# Patient Record
Sex: Female | Born: 1940 | Race: White | Hispanic: No | Smoking: Former smoker
Health system: Southern US, Community
[De-identification: ages and names within clinical notes are randomized; demographics above are authoritative.]

## PROBLEM LIST (undated history)

## (undated) DIAGNOSIS — N390 Urinary tract infection, site not specified: Secondary | ICD-10-CM

## (undated) DIAGNOSIS — J40 Bronchitis, not specified as acute or chronic: Secondary | ICD-10-CM

## (undated) DIAGNOSIS — Z9889 Other specified postprocedural states: Secondary | ICD-10-CM

## (undated) DIAGNOSIS — I10 Essential (primary) hypertension: Secondary | ICD-10-CM

## (undated) DIAGNOSIS — F32A Depression, unspecified: Secondary | ICD-10-CM

## (undated) DIAGNOSIS — Z8719 Personal history of other diseases of the digestive system: Secondary | ICD-10-CM

## (undated) DIAGNOSIS — T4145XA Adverse effect of unspecified anesthetic, initial encounter: Secondary | ICD-10-CM

## (undated) DIAGNOSIS — J189 Pneumonia, unspecified organism: Secondary | ICD-10-CM

## (undated) DIAGNOSIS — E538 Deficiency of other specified B group vitamins: Secondary | ICD-10-CM

## (undated) DIAGNOSIS — Z9114 Patient's other noncompliance with medication regimen: Secondary | ICD-10-CM

## (undated) DIAGNOSIS — M1990 Unspecified osteoarthritis, unspecified site: Secondary | ICD-10-CM

## (undated) DIAGNOSIS — F329 Major depressive disorder, single episode, unspecified: Secondary | ICD-10-CM

## (undated) DIAGNOSIS — Z91148 Patient's other noncompliance with medication regimen for other reason: Secondary | ICD-10-CM

## (undated) DIAGNOSIS — C801 Malignant (primary) neoplasm, unspecified: Secondary | ICD-10-CM

## (undated) DIAGNOSIS — K219 Gastro-esophageal reflux disease without esophagitis: Secondary | ICD-10-CM

## (undated) DIAGNOSIS — I639 Cerebral infarction, unspecified: Secondary | ICD-10-CM

## (undated) DIAGNOSIS — R112 Nausea with vomiting, unspecified: Secondary | ICD-10-CM

## (undated) DIAGNOSIS — E785 Hyperlipidemia, unspecified: Secondary | ICD-10-CM

## (undated) DIAGNOSIS — T8859XA Other complications of anesthesia, initial encounter: Secondary | ICD-10-CM

## (undated) DIAGNOSIS — I5189 Other ill-defined heart diseases: Secondary | ICD-10-CM

## (undated) DIAGNOSIS — G542 Cervical root disorders, not elsewhere classified: Secondary | ICD-10-CM

## (undated) HISTORY — PX: ROTATOR CUFF REPAIR: SHX139

## (undated) HISTORY — PX: ABDOMINAL HYSTERECTOMY: SHX81

## (undated) HISTORY — PX: EYE SURGERY: SHX253

## (undated) HISTORY — PX: TONSILLECTOMY: SUR1361

## (undated) HISTORY — PX: KNEE SURGERY: SHX244

## (undated) HISTORY — PX: BONE CYST EXCISION: SHX376

## (undated) HISTORY — PX: DILATION AND CURETTAGE OF UTERUS: SHX78

## (undated) HISTORY — DX: Deficiency of other specified B group vitamins: E53.8

## (undated) HISTORY — PX: HIP ARTHROPLASTY: SHX981

## (undated) HISTORY — PX: CHOLECYSTECTOMY: SHX55

## (undated) HISTORY — PX: ORTHOPEDIC SURGERY: SHX850

## (undated) HISTORY — PX: APPENDECTOMY: SHX54

## (undated) HISTORY — PX: JOINT REPLACEMENT: SHX530

---

## 2004-05-24 ENCOUNTER — Ambulatory Visit (HOSPITAL_COMMUNITY): Admission: RE | Admit: 2004-05-24 | Discharge: 2004-05-24 | Payer: Self-pay | Admitting: Family Medicine

## 2005-07-24 ENCOUNTER — Ambulatory Visit (HOSPITAL_COMMUNITY): Admission: RE | Admit: 2005-07-24 | Discharge: 2005-07-24 | Payer: Self-pay | Admitting: Family Medicine

## 2006-07-09 ENCOUNTER — Encounter: Admission: RE | Admit: 2006-07-09 | Discharge: 2006-07-09 | Payer: Self-pay | Admitting: Orthopaedic Surgery

## 2006-07-31 ENCOUNTER — Ambulatory Visit (HOSPITAL_COMMUNITY): Admission: RE | Admit: 2006-07-31 | Discharge: 2006-07-31 | Payer: Self-pay | Admitting: Internal Medicine

## 2006-09-29 ENCOUNTER — Inpatient Hospital Stay (HOSPITAL_COMMUNITY): Admission: RE | Admit: 2006-09-29 | Discharge: 2006-10-02 | Payer: Self-pay | Admitting: Orthopaedic Surgery

## 2006-12-01 ENCOUNTER — Ambulatory Visit (HOSPITAL_COMMUNITY): Admission: RE | Admit: 2006-12-01 | Discharge: 2006-12-01 | Payer: Self-pay | Admitting: Orthopaedic Surgery

## 2007-06-11 ENCOUNTER — Encounter: Admission: RE | Admit: 2007-06-11 | Discharge: 2007-06-11 | Payer: Self-pay | Admitting: Orthopaedic Surgery

## 2007-06-25 ENCOUNTER — Encounter: Admission: RE | Admit: 2007-06-25 | Discharge: 2007-06-25 | Payer: Self-pay | Admitting: Orthopaedic Surgery

## 2007-08-02 ENCOUNTER — Ambulatory Visit (HOSPITAL_COMMUNITY): Admission: RE | Admit: 2007-08-02 | Discharge: 2007-08-02 | Payer: Self-pay | Admitting: Internal Medicine

## 2008-02-01 ENCOUNTER — Ambulatory Visit (HOSPITAL_COMMUNITY): Admission: RE | Admit: 2008-02-01 | Discharge: 2008-02-01 | Payer: Self-pay | Admitting: Neurosurgery

## 2008-03-06 ENCOUNTER — Inpatient Hospital Stay (HOSPITAL_COMMUNITY): Admission: RE | Admit: 2008-03-06 | Discharge: 2008-03-10 | Payer: Self-pay | Admitting: Neurosurgery

## 2008-04-21 ENCOUNTER — Encounter: Admission: RE | Admit: 2008-04-21 | Discharge: 2008-04-21 | Payer: Self-pay | Admitting: Orthopaedic Surgery

## 2008-08-03 ENCOUNTER — Ambulatory Visit (HOSPITAL_COMMUNITY): Admission: RE | Admit: 2008-08-03 | Discharge: 2008-08-03 | Payer: Self-pay | Admitting: Internal Medicine

## 2009-08-02 ENCOUNTER — Ambulatory Visit (HOSPITAL_COMMUNITY): Admission: RE | Admit: 2009-08-02 | Discharge: 2009-08-02 | Payer: Self-pay | Admitting: *Deleted

## 2009-08-06 ENCOUNTER — Ambulatory Visit (HOSPITAL_COMMUNITY): Admission: RE | Admit: 2009-08-06 | Discharge: 2009-08-06 | Payer: Self-pay | Admitting: Internal Medicine

## 2009-11-03 HISTORY — PX: COLONOSCOPY: SHX174

## 2010-03-27 ENCOUNTER — Ambulatory Visit: Payer: Self-pay | Admitting: Vascular Surgery

## 2010-03-27 ENCOUNTER — Ambulatory Visit: Admission: RE | Admit: 2010-03-27 | Discharge: 2010-03-27 | Payer: Self-pay | Admitting: Orthopaedic Surgery

## 2010-04-10 ENCOUNTER — Other Ambulatory Visit: Payer: Self-pay | Admitting: Cardiology

## 2010-04-10 ENCOUNTER — Inpatient Hospital Stay (HOSPITAL_COMMUNITY): Admission: AD | Admit: 2010-04-10 | Discharge: 2010-04-12 | Payer: Self-pay | Admitting: Internal Medicine

## 2010-04-10 ENCOUNTER — Ambulatory Visit: Payer: Self-pay | Admitting: Cardiology

## 2010-04-11 ENCOUNTER — Other Ambulatory Visit: Payer: Self-pay | Admitting: Cardiology

## 2010-04-12 ENCOUNTER — Other Ambulatory Visit: Payer: Self-pay | Admitting: Cardiology

## 2010-04-29 ENCOUNTER — Encounter: Payer: Self-pay | Admitting: Internal Medicine

## 2010-04-29 ENCOUNTER — Ambulatory Visit: Payer: Self-pay | Admitting: Gastroenterology

## 2010-04-29 DIAGNOSIS — R0789 Other chest pain: Secondary | ICD-10-CM

## 2010-04-29 DIAGNOSIS — R1310 Dysphagia, unspecified: Secondary | ICD-10-CM | POA: Insufficient documentation

## 2010-04-29 DIAGNOSIS — K219 Gastro-esophageal reflux disease without esophagitis: Secondary | ICD-10-CM | POA: Insufficient documentation

## 2010-05-02 ENCOUNTER — Encounter: Payer: Self-pay | Admitting: Gastroenterology

## 2010-05-09 ENCOUNTER — Encounter: Payer: Self-pay | Admitting: Internal Medicine

## 2010-05-15 ENCOUNTER — Ambulatory Visit: Payer: Self-pay | Admitting: Internal Medicine

## 2010-05-15 ENCOUNTER — Ambulatory Visit (HOSPITAL_COMMUNITY): Admission: RE | Admit: 2010-05-15 | Discharge: 2010-05-15 | Payer: Self-pay | Admitting: Internal Medicine

## 2010-05-16 ENCOUNTER — Telehealth (INDEPENDENT_AMBULATORY_CARE_PROVIDER_SITE_OTHER): Payer: Self-pay

## 2010-06-19 ENCOUNTER — Ambulatory Visit: Payer: Self-pay | Admitting: Internal Medicine

## 2010-07-12 ENCOUNTER — Ambulatory Visit: Payer: Self-pay | Admitting: Internal Medicine

## 2010-07-12 ENCOUNTER — Ambulatory Visit (HOSPITAL_COMMUNITY): Admission: RE | Admit: 2010-07-12 | Discharge: 2010-07-12 | Payer: Self-pay | Admitting: Internal Medicine

## 2010-08-21 ENCOUNTER — Encounter (INDEPENDENT_AMBULATORY_CARE_PROVIDER_SITE_OTHER): Payer: Self-pay | Admitting: *Deleted

## 2010-09-03 ENCOUNTER — Ambulatory Visit (HOSPITAL_COMMUNITY): Admission: RE | Admit: 2010-09-03 | Discharge: 2010-09-03 | Payer: Self-pay | Admitting: Internal Medicine

## 2010-09-24 ENCOUNTER — Ambulatory Visit: Payer: Self-pay | Admitting: Internal Medicine

## 2010-09-30 ENCOUNTER — Ambulatory Visit (HOSPITAL_COMMUNITY): Admission: RE | Admit: 2010-09-30 | Discharge: 2010-09-30 | Payer: Self-pay | Admitting: Internal Medicine

## 2010-10-02 ENCOUNTER — Ambulatory Visit (HOSPITAL_BASED_OUTPATIENT_CLINIC_OR_DEPARTMENT_OTHER): Admission: RE | Admit: 2010-10-02 | Discharge: 2010-10-02 | Payer: Self-pay | Admitting: Orthopedic Surgery

## 2010-10-05 ENCOUNTER — Emergency Department (HOSPITAL_COMMUNITY)
Admission: EM | Admit: 2010-10-05 | Discharge: 2010-10-05 | Payer: Self-pay | Source: Home / Self Care | Admitting: Emergency Medicine

## 2010-11-18 ENCOUNTER — Ambulatory Visit
Admission: RE | Admit: 2010-11-18 | Discharge: 2010-11-19 | Payer: Self-pay | Source: Home / Self Care | Attending: Orthopedic Surgery | Admitting: Orthopedic Surgery

## 2010-11-20 LAB — POCT I-STAT 4, (NA,K, GLUC, HGB,HCT)
Glucose, Bld: 236 mg/dL — ABNORMAL HIGH (ref 70–99)
HCT: 39 % (ref 36.0–46.0)
Hemoglobin: 13.3 g/dL (ref 12.0–15.0)
Potassium: 3.9 mEq/L (ref 3.5–5.1)
Sodium: 137 mEq/L (ref 135–145)

## 2010-11-20 LAB — GLUCOSE, CAPILLARY
Glucose-Capillary: 245 mg/dL — ABNORMAL HIGH (ref 70–99)
Glucose-Capillary: 391 mg/dL — ABNORMAL HIGH (ref 70–99)
Glucose-Capillary: 176 mg/dL — ABNORMAL HIGH (ref 70–99)

## 2010-12-05 NOTE — Letter (Signed)
Summary: Recall Office Visit  [facility]  [address]   [place], [place] [address]   Phone: [phone]  Fax: [phone]      August 21, 2010   Theresa Kramer [address] [place], [place]  [address] 1941-07-03   Dear [name],   According to our records, it is time for you to schedule a follow-up office visit with [place].   At your convenience, please call [phone] to schedule an office visit. If you have any questions, concerns, or feel that this letter is in error, we would appreciate your call.   Sincerely,    Diana Eves  [facility] Ph: [phone]   Fax: [phone]

## 2010-12-05 NOTE — Letter (Signed)
Summary: Internal Other /EGD/ED order  Internal Other /EGD/ED order   Imported By: Cloria Spring LPN 16/08/9603 54:09:81  _____________________________________________________________________  External Attachment:    Type:   Image     Comment:   External Document

## 2010-12-05 NOTE — Letter (Signed)
Summary: referral from dr fagan  referral from dr fagan   Imported By: Rosine Beat 05/09/2010 15:05:25  _____________________________________________________________________  External Attachment:    Type:   Image     Comment:   External Document

## 2010-12-05 NOTE — Letter (Signed)
Summary: Internal Other  Internal Other   Imported By: Peggyann Shoals 05/02/2010 14:36:45  _____________________________________________________________________  External Attachment:    Type:   Image     Comment:   External Document

## 2010-12-05 NOTE — Letter (Signed)
Summary: Internal Other  Internal Other   Imported By: Peggyann Shoals 05/02/2010 14:37:47  _____________________________________________________________________  External Attachment:    Type:   Image     Comment:   External Document

## 2010-12-05 NOTE — Assessment & Plan Note (Signed)
Summary: STOMACH BURNING,HURTING/SS   Visit Type:  Follow-up Visit Primary Care Davinity Fanara:  Ouida Sills  Chief Complaint:  F/U EGD.  History of Present Illness: 70 year old lady returns for followup GERD. Recent EGD demonstrated a Schatzki's ring which was dilated and disrupted. 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  She has been on Nexium but has only been taking it sporadically according to her daughter. She is having less dysphagia since she underwent esophageal dilation. She's only been on Nexium for about a week straight at this time. We noted she's never had a colonoscopy. She describes having a sigmoidoscopy many many years ago by Dr. Ouida Sills. She is currently having no lower GI tract symptoms. No family history of polyps or colon cancer 4 she knows.  Current Medications (verified): 1)  Lopid 600 Mg Tabs (Gemfibrozil) .... Two Times A Day 2)  Aspirin 81 Mg Tbec (Aspirin) .... Once Daily 3)  Nexium 40 Mg Cpdr (Esomeprazole Magnesium) .... Once Daily 4)  Glipizide Xl 10 Mg Xr24h-Tab (Glipizide) .... Once Daily 5)  Metformin Hcl 1000 Mg Tabs (Metformin Hcl) .... Two Times A Day 6)  Lantus 100 Unit/ml Soln (Insulin Glargine) .... 30 Units Once Daily 7)  Lisinopril 40 Mg Tabs (Lisinopril) .... Once Daily 8)  Chlorthalidone 25 Mg Tabs (Chlorthalidone) .... Once Daily 9)  Celexa 20 Mg Tabs (Citalopram Hydrobromide) .... Once Daily 10)  Mirapex 0.25 Mg Tabs (Pramipexole Dihydrochloride) .... At Bedtime 11)  Pravachol 40 Mg Tabs (Pravastatin Sodium) .... Take 1 Tablet By Mouth Once A Day 12)  Iron Injection .... Once Monthly 13)  Oxybutynin Chloride 10 Mg Xr24h-Tab (Oxybutynin Chloride) .... Take 1 Tablet By Mouth Two Times A Day  Allergies (verified): No Known Drug Allergies  Vital Signs:  Patient profile:   70 year old female Height:      66 inches Weight:      200 pounds BMI:     32.40 Temp:      99.1 degrees F oral Pulse rate:   80 / minute BP sitting:   110 / 60  (left arm) Cuff size:   regular  Vitals Entered By: Cloria Spring LPN (June 19, 2010 2:59 PM)  Physical Exam  General:  pleasant lady company by her husband and daughter Lungs:  clear to auscultation Heart:  regular rate and rhythm without murmur gallop or rub  Impression & Recommendations: Impression: 70 year old lady with a GERD and recent symptoms of dysphagia. Schatzki's ring dilated. 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 Dysphagia symptoms have improved however GERD symptoms remain a problem but she has been taking her Nexium only sporadically.  She is in need of colorectal cancer screening via colonoscopy.  Recommendations: Take Nexium 40 mg orally a ring morning 30 minutes before breakfast. Screening colonoscopy the near future. Risks, benefits, limitations, alternatives and imponderables have been reviewed. All parties questions have been answered all parties were agreeable. Further recommendations to follow  Appended Document: Orders Update    Clinical Lists Changes  Orders: Added new Service order of Est. Patient Level III (41324) - Signed

## 2010-12-05 NOTE — Assessment & Plan Note (Signed)
Summary: PP FU/SS   Visit Type:  Follow-up Visit Primary Care [name]:  Theresa Kramer  Chief Complaint:  F/U choking.  History of Present Illness: Reflux symptoms better on Nexium. Still with episodes of dysphagia about 3 monthly. This is much better since she had her Schatzki's ring dilated; she states she does describe blood transient food impactions when this occurs. Findings on recent colonoscopy reassuring. She'll be due for routine screening 10 years.     Current Medications (verified): 1)  Lopid 600 Mg Tabs (Gemfibrozil) .... Two Times A Day 2)  Aspirin 81 Mg Tbec (Aspirin) .... Once Daily 3)  Nexium 40 Mg Cpdr (Esomeprazole Magnesium) .... Once Daily 4)  Glipizide Xl 10 Mg Xr24h-Tab (Glipizide) .... Once Daily 5)  Metformin Hcl 1000 Mg Tabs (Metformin Hcl) .... Two Times A Day 6)  Lantus 100 Unit/ml Soln (Insulin Glargine) .... 30 Units Once Daily At Night 7)  Lisinopril 40 Mg Tabs (Lisinopril) .... Once Daily 8)  Chlorthalidone 25 Mg Tabs (Chlorthalidone) .... Once Daily 9)  Celexa 20 Mg Tabs (Citalopram Hydrobromide) .... Once Daily 10)  Mirapex 0.25 Mg Tabs (Pramipexole Dihydrochloride) .... At Bedtime 11)  Pravachol 40 Mg Tabs (Pravastatin Sodium) .... Take 1 Tablet By Mouth Once A Day 12)  Iron Injection .... Once Monthly 13)  Oxybutynin Chloride 10 Mg Xr24h-Tab (Oxybutynin Chloride) .... Take 1 Tablet By Mouth Two Times A Day  Allergies (verified): No Known Drug Allergies  Past History:  Past Medical History: Last updated: 04/29/2010 Arthritis Depression Diabetes GERD Hyperlipidemia Hypertension Obesity Restless Leg Syndrome B12 deficiency Nonobstructive CAD, angina  Past Surgical History: Last updated: 04/29/2010 Cardiac cath 6/11-->nonobstructive CAD Appendectomy Back surgery Back Surgery, 2009 Hip Replacement, right, 2007 Hysterectomy Tonsillectomy Left shoulder replacement, 2008  Family History: Last updated: 04/29/2010 No FH of CRC, colon  polyps. Brother, cirrhosis, etoh  Social History: Last updated: 04/29/2010 Married. Two children. Quit tob 1982. No alcohol. Retired.  Vital Signs:  Patient profile:   70 year old female Height:      66 inches Weight:      220 pounds BMI:     35.64 Temp:     99.2 degrees F oral Pulse rate:   88 / minute BP sitting:   122 / 60  (left arm) Cuff size:   large  Vitals Entered By: Cloria Spring LPN (September 24, 2010 8:54 AM)  Physical Exam  General:  very pleasant lady resting comfortably in by her husband and daughter Abdomen:  obese. Positive bowel sounds soft nontender without appreciable mass or organomegaly  Impression & Recommendations: Impression: Reflux symptoms well-controlled. Dysphagia improved but not totally resolved after dilation a Schatzki's ring. She did have some saccular dilation of her esophagus which makes me think of a possible underlying esophageal motility disorder.  Recommendations: continuie antireflux lifestyle. Continue Nexium. Barium pill esophagram in the near future to reassess dysphagia. Further recommendations to follow.  Appended Document: Orders Update    Clinical Lists Changes  Orders: Added new Service order of Est. Patient Level III ([id number]) - Signed

## 2010-12-05 NOTE — Assessment & Plan Note (Signed)
Summary: GERD- cdg   Visit Type:  Consult Referring Provider:  Carylon Kramer Primary Care Provider:  Carylon Kramer  Chief Complaint:  gerd, pain chest, and difficulty swallowing.  History of Present Illness: Theresa Kramer is a pleasant 70 y/o WF, patient of Theresa Kramer, who presents for consideration of EGD. Patient has long h/o GERD. In remote past, she describes having EGD at [facility] for similar symptoms. They told her she did not have stricture but her esophagus did not work well. She apparently has seen Theresa Kramer in remote past, but records unavailable. Lately, she has had increased water brash, heartburn. She has trouble swallowing foods, especially breads, and sometimes liquids. Often when this occurs she will have vomiting. She describes grabbing type pain in lower substernal region with radiation into neck. She admits to not taken her omeprazole correctly or daily. Recently switched to Nexium, which she takes every morning before breakfast. BMs okay. No melena, brbpr, no weight loss. Never had complete TCS.  Recent LFTs, CBC in 6/11 were unremarkable.  Current Medications (verified): 1)  Simvastatin 40 Mg Tabs (Simvastatin) .... Once Daily 2)  Lopid 600 Mg Tabs (Gemfibrozil) .... Two Times A Day 3)  Aspirin 81 Mg Tbec (Aspirin) .... Once Daily 4)  Nexium 40 Mg Cpdr (Esomeprazole Magnesium) .... Once Daily 5)  Glipizide Xl 10 Mg Xr24h-Tab (Glipizide) .... Once Daily 6)  Metformin Hcl 1000 Mg Tabs (Metformin Hcl) .... Two Times A Day 7)  Lantus 100 Unit/ml Soln (Insulin Glargine) .... 30 Units Once Daily 8)  Lisinopril 40 Mg Tabs (Lisinopril) .... Once Daily 9)  Chlorthalidone 25 Mg Tabs (Chlorthalidone) .... Once Daily 10)  Celexa 20 Mg Tabs (Citalopram Hydrobromide) .... Once Daily 11)  Mirapex 0.25 Mg Tabs (Pramipexole Dihydrochloride) .... At Bedtime  Allergies (verified): No Known Drug Allergies  Past History:  Past Medical  History: Arthritis Depression Diabetes GERD Hyperlipidemia Hypertension Obesity Restless Leg Syndrome B12 deficiency Nonobstructive CAD, angina  Past Surgical History: Cardiac cath 6/11-->nonobstructive CAD Appendectomy Back surgery Back Surgery, 2009 Hip Replacement, right, 2007 Hysterectomy Tonsillectomy Left shoulder replacement, 2008  Family History: No FH of CRC, colon polyps. Brother, cirrhosis, etoh  Social History: Married. Two children. Quit tob 1982. No alcohol. Retired.  Review of Systems General:  Denies fever, chills, sweats, anorexia, fatigue, weakness, and weight loss. Eyes:  Denies vision loss. ENT:  Complains of difficulty swallowing; denies nasal congestion. CV:  Denies chest pains, angina, dyspnea on exertion, and peripheral edema. Resp:  Denies dyspnea at rest, dyspnea with exercise, and cough. GI:  See HPI. GU:  Denies urinary burning and blood in urine. MS:  Complains of joint pain / LOM. Derm:  Denies rash and itching. Neuro:  Denies weakness, frequent headaches, memory loss, and confusion. Psych:  Complains of depression; denies anxiety and suicidal ideation. Endo:  Denies unusual weight change. Heme:  Denies bruising and bleeding. Allergy:  Denies hives and rash.  Vital Signs:  Patient profile:   70 year old female Height:      66 inches Weight:      214 pounds BMI:     34.67 Temp:     98.1 degrees F oral Pulse rate:   64 / minute BP sitting:   128 / 80  (left arm) Cuff size:   regular  Vitals Entered By: Theresa Kramer (April 29, 2010 8:38 AM)  Physical Exam  General:  Well developed, well nourished, no acute distress.obese.   Head:  Normocephalic and atraumatic. Eyes:  Conjunctivae  pink, no scleral icterus.  Mouth:  Oropharyngeal mucosa moist, pink.  No lesions, erythema or exudate.    Neck:  Supple; no masses or thyromegaly. Lungs:  Clear throughout to auscultation. Heart:  Regular rate and rhythm; no murmurs, rubs,  or  bruits. Abdomen:  Bowel sounds normal.  Abdomen is soft, nontender, nondistended.  No rebound or guarding.  No hepatosplenomegaly, masses or hernias.  No abdominal bruits. obese.   Extremities:  No clubbing, cyanosis, edema or deformities noted. Neurologic:  Alert and  oriented x4;  grossly normal neurologically. Skin:  Intact without significant lesions or rashes. Cervical Nodes:  No significant cervical adenopathy. Psych:  Alert and cooperative. Normal mood and affect.  Impression & Recommendations:  Problem # 1:  GERD (ICD-530.81)  Chronic GERD with recent hospitalization for chest pain. Cardiac cath showed nonobstructive CAD. She described remote EGD and ?esophageal spasms/dysmotiligy? Tried on NTG years ago. Up until recently she had not been taking her PPI regularly. Suspect symptoms secondary to GERD. She may have esophageal stricture or motility d/o to explain her swallowing problems. EGD/ED to be performed in near future.  Risks, alternatives, benefits including but not limited to risk of reaction to medications, bleeding, infection, and perforation addressed.  Patient voiced understanding and verbal consent obtained.   Orders: Consultation Level IV (16109)  Problem # 2:  SCREENING COLORECTAL-CANCER (ICD-V76.51) No prior complete colonoscopy. After current symptoms are improved, recommend she have complete TCS. She will discuss with Theresa Kramer.  I would like to thank Theresa Kramer for allowing [place] to take part in the care of this nice patient.  Appended Document: GERD- cdg HOLD ASA 5 DAYS PRIOR TO ENDO.  Appended Document: GERD- cdg RMR patient. No need to hold ASA.  Appended Document: GERD- cdg Pt was told to continue ASA.

## 2010-12-05 NOTE — Progress Notes (Signed)
Summary: phone message/ c/o following EGD  Phone Note Call from Patient   Caller: Patient Summary of Call: Pt called to say she she is still having some pain and discomfort in her esophagus. (had EGD yesterday). Said she is having  some difficulty swallowing and tightness between her breasts. Wants to know if this is normal. I told her it is normal to have some discomfort for a few days. She said it is not bad, she is OK if it just goes away. She vomited x one yesterday. No blood.  She was advised to call if pain worsens, she has shortness of breath or vomits blood. Are there any other recommendations?  Initial call taken by: Cloria Spring LPN,  May 16, 2010 8:35 AM     Appended Document: phone message/ c/o following EGD agree with recommendations as above.  need ov in 1 month; depending onsx response, may need to get a BPE to further evaluate.  Appended Document: phone message/ c/o following EGD Pt informed.Will call if worsens.  Appended Document: phone message/ c/o following EGD pt aware of appt for 8/17 @ 2:30pm w/RMR. pt wants sooner appt with RMR only. I told her I would call if I had any cancellations.

## 2010-12-05 NOTE — Letter (Signed)
Summary: BPE ORDER  BPE ORDER   Imported By: Ave Filter 09/24/2010 09:30:45  _____________________________________________________________________  External Attachment:    Type:   Image     Comment:   External Document

## 2010-12-05 NOTE — Letter (Signed)
Summary: TCS order   TCS order   Imported By: Peggyann Shoals 06/19/2010 16:28:04  _____________________________________________________________________  External Attachment:    Type:   Image     Comment:   External Document

## 2011-01-14 LAB — POCT I-STAT 4, (NA,K, GLUC, HGB,HCT)
Glucose, Bld: 123 mg/dL — ABNORMAL HIGH (ref 70–99)
HCT: 58 % — ABNORMAL HIGH (ref 36.0–46.0)
Hemoglobin: 19.7 g/dL — ABNORMAL HIGH (ref 12.0–15.0)
Sodium: 139 mEq/L (ref 135–145)

## 2011-01-14 LAB — CBC
HCT: 33.3 % — ABNORMAL LOW (ref 36.0–46.0)
MCV: 86.9 fL (ref 78.0–100.0)
Platelets: 235 10*3/uL (ref 150–400)
RBC: 3.83 MIL/uL — ABNORMAL LOW (ref 3.87–5.11)
WBC: 4.8 10*3/uL (ref 4.0–10.5)

## 2011-01-14 LAB — DIFFERENTIAL
Eosinophils Relative: 6 % — ABNORMAL HIGH (ref 0–5)
Lymphocytes Relative: 32 % (ref 12–46)
Lymphs Abs: 1.5 10*3/uL (ref 0.7–4.0)
Neutro Abs: 2.7 10*3/uL (ref 1.7–7.7)

## 2011-01-14 LAB — BASIC METABOLIC PANEL
Chloride: 102 mEq/L (ref 96–112)
GFR calc Af Amer: >60 mL/min (ref >60)
Potassium: 3.9 mEq/L (ref 3.5–5.1)

## 2011-01-16 LAB — GLUCOSE, CAPILLARY

## 2011-01-20 LAB — COMPREHENSIVE METABOLIC PANEL
AST: 24 U/L (ref 0–37)
Albumin: 3.5 g/dL (ref 3.5–5.2)
BUN: 18 mg/dL (ref 6–23)
CO2: 27 mEq/L (ref 19–32)
Calcium: 8.8 mg/dL (ref 8.4–10.5)
Creatinine, Ser: 0.73 mg/dL (ref 0.4–1.2)
GFR calc Af Amer: >60 mL/min (ref >60)
GFR calc non Af Amer: >60 mL/min (ref >60)
Sodium: 139 mEq/L (ref 135–145)
Total Bilirubin: 0.6 mg/dL (ref 0.3–1.2)
Total Protein: 6.2 g/dL (ref 6.0–8.3)

## 2011-01-20 LAB — CBC
HCT: 32.4 % — ABNORMAL LOW (ref 36.0–46.0)
HCT: 37.1 % (ref 36.0–46.0)
HCT: 31.8 % — ABNORMAL LOW (ref 36.0–46.0)
MCHC: 33.3 g/dL (ref 30.0–36.0)
MCHC: 33.5 g/dL (ref 30.0–36.0)
MCHC: 34.1 g/dL (ref 30.0–36.0)
MCV: 88.6 fL (ref 78.0–100.0)
MCV: 88.9 fL (ref 78.0–100.0)
MCV: 87 fL (ref 78.0–100.0)
Platelets: 207 10*3/uL (ref 150–400)
Platelets: 226 10*3/uL (ref 150–400)
RBC: 4.17 MIL/uL (ref 3.87–5.11)
RDW: 14.5 % (ref 11.5–15.5)
RDW: 14.3 % (ref 11.5–15.5)
WBC: 5 10*3/uL (ref 4.0–10.5)
WBC: 4.4 10*3/uL (ref 4.0–10.5)

## 2011-01-20 LAB — MRSA PCR SCREENING: MRSA by PCR: NEGATIVE

## 2011-01-20 LAB — DIFFERENTIAL
Basophils Absolute: 0 10*3/uL (ref 0.0–0.1)
Eosinophils Relative: 4 % (ref 0–5)
Lymphocytes Relative: 33 % (ref 12–46)
Lymphs Abs: 1.4 10*3/uL (ref 0.7–4.0)
Monocytes Absolute: 0.3 10*3/uL (ref 0.1–1.0)
Monocytes Relative: 6 % (ref 3–12)
Neutro Abs: 2.5 10*3/uL (ref 1.7–7.7)

## 2011-01-20 LAB — GLUCOSE, CAPILLARY
Glucose-Capillary: 126 mg/dL — ABNORMAL HIGH (ref 70–99)
Glucose-Capillary: 170 mg/dL — ABNORMAL HIGH (ref 70–99)
Glucose-Capillary: 171 mg/dL — ABNORMAL HIGH (ref 70–99)
Glucose-Capillary: 237 mg/dL — ABNORMAL HIGH (ref 70–99)
Glucose-Capillary: 303 mg/dL — ABNORMAL HIGH (ref 70–99)
Glucose-Capillary: 93 mg/dL (ref 70–99)

## 2011-01-20 LAB — CARDIAC PANEL(CRET KIN+CKTOT+MB+TROPI)
CK, MB: 2 ng/mL (ref 0.3–4.0)
Relative Index: 0.5 (ref 0.0–2.5)
Total CK: 442 U/L — ABNORMAL HIGH (ref 7–177)
Total CK: 443 U/L — ABNORMAL HIGH (ref 7–177)
Total CK: 416 U/L — ABNORMAL HIGH (ref 7–177)
Troponin I: 0.01 ng/mL (ref 0.00–0.06)
Troponin I: 0.01 ng/mL (ref 0.00–0.06)

## 2011-01-20 LAB — HEPARIN LEVEL (UNFRACTIONATED): Heparin Unfractionated: 0.68 IU/mL (ref 0.30–0.70)

## 2011-01-20 LAB — BASIC METABOLIC PANEL
BUN: 11 mg/dL (ref 6–23)
CO2: 26 mEq/L (ref 19–32)
Calcium: 8.7 mg/dL (ref 8.4–10.5)
Chloride: 106 mEq/L (ref 96–112)
Chloride: 108 mEq/L (ref 96–112)
Creatinine, Ser: 0.68 mg/dL (ref 0.4–1.2)
Creatinine, Ser: 0.7 mg/dL (ref 0.4–1.2)
GFR calc Af Amer: >60 mL/min (ref >60)
GFR calc non Af Amer: >60 mL/min (ref >60)
Potassium: 4.2 mEq/L (ref 3.5–5.1)

## 2011-01-20 LAB — LIPID PANEL
LDL Cholesterol: 98 mg/dL (ref 0–99)
VLDL: 68 mg/dL — ABNORMAL HIGH (ref 0–40)

## 2011-01-20 LAB — APTT: aPTT: 25 seconds (ref 24–37)

## 2011-02-24 ENCOUNTER — Other Ambulatory Visit: Payer: Self-pay | Admitting: Orthopedic Surgery

## 2011-02-24 ENCOUNTER — Other Ambulatory Visit: Payer: Self-pay | Admitting: Urology

## 2011-02-24 DIAGNOSIS — R52 Pain, unspecified: Secondary | ICD-10-CM

## 2011-02-24 DIAGNOSIS — M25511 Pain in right shoulder: Secondary | ICD-10-CM

## 2011-02-26 ENCOUNTER — Ambulatory Visit
Admission: RE | Admit: 2011-02-26 | Discharge: 2011-02-26 | Disposition: A | Discharge status: Home / Self Care | Payer: Medicare Other | Source: Ambulatory Visit | Attending: Orthopedic Surgery | Admitting: Orthopedic Surgery

## 2011-02-26 DIAGNOSIS — M25511 Pain in right shoulder: Secondary | ICD-10-CM

## 2011-03-18 NOTE — Op Note (Signed)
Theresa Kramer, COTHERN NO.:  [id number]   MEDICAL RECORD NO.:  [id number]          PATIENT TYPE:  INP   LOCATION:  3010                         FACILITY:  [facility]   PHYSICIAN:  Cristi Loron, M.D.DATE OF BIRTH:  20-Apr-1941   DATE OF PROCEDURE:  03/06/2008  DATE OF DISCHARGE:                               OPERATIVE REPORT   BRIEF HISTORY:  The patient is a 70 year old white female who has  suffered from back and bilateral leg pain consistent with neurogenic  claudication.  She failed medical management, worked up with a lumbar  MRI.  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  I discussed the various  treatment options with the patient including surgery.  She is aware of  the risks, benefits, and alternatives of the surgery and to proceed with  a decompressive laminectomy.   PREOPERATIVE DIAGNOSES:  L2-3, 3-4, and 4-5 disk degeneration, spinal  stenosis, facet arthropathy, lumbar radiculopathy/myelopathy, and  lumbago.   POSTOPERATIVE DIAGNOSES:  L2-L3, L3-L4, and L4-L5 disk degeneration,  spinal stenosis, facet arthropathy, lumbar radiculopathy/myelopathy, and  lumbago.   PROCEDURE:  Bilateral L4-L5 laminectomy with bilateral L2 laminotomies  to decompressive bilateral L3, 4 and 5 nerve roots using  microdissection.   SURGEON:  Cristi Loron, M.D.   ASSISTANT:  Clydene Fake, M.D.   ANESTHESIA:  General endotracheal.   ESTIMATED BLOOD LOSS:  100 mL.   SPECIMENS:  None.   DRAINS:  None.   COMPLICATIONS:  None.   DESCRIPTION OF PROCEDURE:  The patient was brought to the operating room  by Anesthesia Team, general endotracheal anesthesia was induced.  The  patient was turned to the prone position on the Wilson frame.  The  lumbosacral region was then prepared with Betadine scrub and Betadine  solution.  Sterile drapes were applied.  I then injected the area to be  incised with  Marcaine with epinephrine solution.  Using scalpel to make  a linear midline incision over the L2-3, 3-4, and 4-5 interspaces.  I  used electrocautery and performed a bilateral subperiosteal dissection  exposing the spinous process lamina of L2, 3, 4 and 5.  We obtained an  intraoperative radiograph to confirm our location and then inserted the  [employer] and cerebellar retractors for exposure.  We began the  decompression by incising the L4-5, L3-4 and L2-3 interspinous ligaments  with the scalpel.  We used Leksell rongeur to remove the spinous process  of L4 then L3 and a caudal aspect of the L2 spinous process.  We then  used a high-speed drill to perform bilateral L4, L3, and L2  laminotomies.   We then brought the operative microscope into the field and by  specification illumination, we completed the decompression sites  microdissection.  We widened the laminotomies at L2, we removed the L2-3  ligamentum flavum and completed the L3 laminectomy, then removed the L3-  4 ligamentum flavum.  Then, we completed the L4 laminectomy and remove  the L4-5 ligamentum flavum.  We then used a Kerrison punch to  remove  some excess ligamentum flavum and facet hypertrophy from the lateral  recesses further decompressing the thecal sac.  We then performed  foraminotomies of about bilateral L3, 4, and 5 nerve roots completing  the decompression.  We inspected the L2-3, 3-4, and 4-5 intervertebral  disk.  They were bulging somewhat, but there was no disk herniations.  Then, obtained hemostasis using bipolar cautery.  We then palpated along  the ventral surface of the thecal sac and along the exit route of the  bilateral L3, 4, and 5 nerve roots and noted.  Neural structure well  decompressed and we removed the retractors and then reapproximated the  patient's  lumbar fascia with interrupted #1 Vicryl suture.  The  subcutaneous tissue was interrupted with 2-0 Vicryl suture and the skin  with  Steri-Strips and Benzoin.  The wound was then coated with  bacitracin ointment.  A sterile dressings were applied.  The drapes were  removed and the patient was subsequently returned to the supine  position, where she was extubated by the Anesthesia Team and transported  to the Post Anesthesia Care Unit in stable condition.  All sponge,  instrument, and needle counts were correct in this case.      Cristi Loron, M.D.  Electronically Signed     JDJ/MEDQ  D:  03/06/2008  T:  03/07/2008  Job:  [id number]

## 2011-03-21 NOTE — Discharge Summary (Signed)
Theresa Kramer, Theresa Kramer                ACCOUNT NO.:  [id number]   MEDICAL RECORD NO.:  [id number]          PATIENT TYPE:  INP   LOCATION:  5034                         FACILITY:  [facility]   PHYSICIAN:  Claude Manges. 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   DISCHARGE DIAGNOSES:  1. 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  2. History of diabetes mellitus, noninsulin dependent.  3. Hypertension.  4. Exogenous obesity.   PROCEDURE:  Right total hip arthroplasty.   HISTORY:  A 70 year old female with right hip pain, mainly in the groin  area.  She had significant pain, which worsens with activities of daily  living.  She has noted a loss of motion as well as pain in the right  hip.  Radiographs reveal endstage osteoarthritis of the right hip.  Indicate for right total hip arthroplasty.   HOSPITAL COURSE:  A 70 year old female admitted September 29, 2006 after  appropriate laboratories were obtained, as well as 1 g of Ancef IV on  call at the operating room, as well as 3000 units of heparin subcu  preop.  She was taken to the operating room, where she underwent a right  total hip arthroplasty.  She tolerated the procedure well.  She was  continued on Ancef 1 g IV every 8 hours times 3 doses.  Also clindamycin  600 mg IV every 8 hours times 3 doses.  Transfused 1 unit of packed  cells in the PAC-U.  Placed on Coumadin protocol.  Heparin 3000 units  subcu every 12 hours was continued postoperatively, until the Coumadin  became therapeutic.  A Foley was placed intraoperatively.  Consults with  PT, OT and care management were made.  She has partial weightbearing 50%  body weight on the right.  She was allowed out of bed to chair the  following day.  Her IV with saline locked when tolerating  p.o. as well.  PCA was discontinued.  On the 29, she had been noted to have aggressive  pulmonary toiletry.  Her Foley was discontinued once up with PT.  She  was taught Lovenox injections.  She was discharged on the 30, to return  back to the office in follow up with Dr. Cleophas Dunker 2 weeks postop.  EKG  was read as normal sinus rhythm.   LABORATORY STUDIES:  Hemoglobin 11.1, hematocrit 32.6%, white count  3700, platelets 207,000.  Discharge hemoglobin 8.7, hematocrit 25.1,  white count 5400, platelets 197,000.  ProTime admitting 13, INR 1, PTT  28.  Discharge ProTime 20, INR 1.6.  Preop sodium 136, potassium 4.3,  chloride 105, CO2 25, glucose 83, BUN 18, creatinine 0.5, calcium 8.7,  total protein 6.4, albumin 3.7, AST 15, ALT 17, ALP 30, total bilirubin  0.5.  Discharge sodium 133, potassium 3.8, chloride 100, CO2 28, glucose  92, BUN 10, creatinine 0.5, calcium 8.3, glycosylated hemoglobin of  September 29, 2006 was 6.  Urinalysis November 27 revealed trace  hemoglobin, trace leukocyte esterase, 0-2 whites, 0-2 reds.  Blood type  is A positive.  Antibody screen negative.  Given 1 unit of packed cells  during her hospital course.   DISCHARGE INSTRUCTIONS:  Her diet is a medium modified carb diet.  She  may shower/bathe after 2 days of no drainage.  Weightbearing as  tolerated.  Keep her wound clean and dry and change her dressing.  Call  if any signs of infection.  Resume home meds except for aspirin.  Coumadin - take as directed by pharmacy.  Percocet 5/325 - one to two  tabs every 4-6 hours as needed for pain.  Over-the-counter iron 325 mg -  1 tab 3 times a day for 28 days.  A stool softener laxative as needed.  Lovenox - take as directed.  TET hose during the day, knee immobilizers  of the right leg when in bed.  Follow back up with [place] and with Dr.  Cleophas Dunker in 2 weeks postop.  Discharged improved condition.   DICTATED BY:  Oris Drone. Petrarca, P.A.-C.      Claude Manges. Cleophas Dunker,  M.D.  Electronically Signed     PWW/MEDQ  D:  11/25/2006  T:  11/26/2006  Job:  [id number]

## 2011-03-21 NOTE — Discharge Summary (Signed)
NAMEJENNELLE, PINKSTAFF NO.:  [id number]   MEDICAL RECORD NO.:  [id number]          PATIENT TYPE:  INP   LOCATION:  3010                         FACILITY:  [facility]   PHYSICIAN:  Cristi Loron, M.D.DATE OF BIRTH:  07-13-1941   DATE OF ADMISSION:  03/06/2008  DATE OF DISCHARGE:  03/10/2008                               DISCHARGE SUMMARY   BRIEF HISTORY:  The patient is a 70 year old white female who has  suffered from back and bilateral leg pain consistent with neurogenic  claudication.  She has failed medical management and was worked up with  the lumbar MRI and myelo-CT.  These images demonstrate that the patient  had multifactorial spinal stenosis at L2-L3, L3-L4, and L4-L5.  I  discussed the various treatment options with the patient including  surgery.  The patient is aware of the risks, benefits, and alternatives  of the surgery and decided to proceed with a decompressive laminectomy.   For further details of this admission, please refer to typed history and  physical.   HOSPITAL COURSE:  I admitted the patient to [facility] on Mar 06, 2008.  On the day of admission, I performed an L2 through L4  laminectomy to decompressive bilateral L3, L4, and L5 nerve roots using  microsection.  The surgery went well (for full details of this  operation, please refer to typed operative note).   POSTOPERATIVE COURSE:  The patient's postoperative course was remarkable  only for some right hip pain.  She seemed to have pain coming the from  hip itself or they got some x-rays, which demonstrated a right hip  prosthesis looking good.  She was treated Neurontin and Naprosyn and her  symptoms resolved.   By Mar 10, 2008, the patient was afebrile.  Vital signs were stable.  She  was eating well, ambulating well, and was requesting to discharge to  home.  She was ready to be discharged home on Mar 10, 2008.   DISCHARGE INSTRUCTIONS:  The patient was given written  discharge  instructions.  Instructed to follow up with me in 4 weeks and instructed  to check her blood glucose frequently.   DISCHARGE PRESCRIPTIONS:  1. Percocet 5/325 #100 one to two p.o. every 4 hours p.r.n. pain.  2. Valium 5 mg #50 one p.o. every 6 hours p.r.n. muscle spasms.   FINAL DIAGNOSES:  1. L2-L3, L3-L4, and L4-L5 degenerative spinal stenosis.  2. Facet arthropathy.  3. Lumbar radiculopathy/myelopathy.  4. Lumbago.   PROCEDURE PERFORMED:  Bilateral L3-L4 laminectomy with bilateral 2  laminotomies to decompress bilateral L3, L4, and L5 nerve roots using  microdissection.      Cristi Loron, M.D.  Electronically Signed     JDJ/MEDQ  D:  04/03/2008  T:  04/04/2008  Job:  [id number]

## 2011-03-21 NOTE — Op Note (Signed)
NAMECARLENA, Theresa Kramer                ACCOUNT NO.:  [id number]   MEDICAL RECORD NO.:  [id number]          PATIENT TYPE:  INP   LOCATION:  2550                         FACILITY:  [facility]   PHYSICIAN:  Claude Manges. Whitfield, M.D.DATE OF BIRTH:  05-27-1941   DATE OF PROCEDURE:  09/29/2006  DATE OF DISCHARGE:                               OPERATIVE REPORT   PREOPERATIVE DIAGNOSIS:  End-stage osteoarthritis right hip.   POSTOPERATIVE DIAGNOSIS:  End-stage osteoarthritis right hip.   PROCEDURE:  Right total hip replacement.   SURGEON:  Dr. Cleophas Dunker   ASSISTANT:  Dr. Chaney Malling and Arnoldo Morale, [facility]   ANESTHESIA:  General.   COMPLICATIONS:  None.   COMPONENTS:  DePuy AML large 12.5 mm femoral component with a 32 mm hip  ball +1 mm neck length.  The 54 mm auto-diameter 100 series acetabular  component with an apex hole eliminator and a Marathon polyethylene  liner.   PROCEDURE:  With the patient comfortable on the operating table and  under general orotracheal anesthesia, nursing staff inserted a Foley  catheter.  Urine was clear.  The patient was then placed in the lateral  decubitus position with the right side up and secured to the operating  room table with the Innomed Hip System.   The right hip was then prepped from iliac crest to the ankle with  Betadine scrub and then DuraPrep.  Sterile draping was performed.   A routine southern incision was utilized and via sharp dissection  carried down to subcutaneous tissue.  There was abundant adipose tissue  which made the procedure technically difficult.  The adipose was then  incised probably 3-4 inches to the level of the iliotibial band.  Soft  tissue was retracted and the iliotibial band incised with the Bovie.  An  East-West retractor was inserted in addition to the cerebellar  retractors.  With the hip internally rotated, the adipose tissue was  elevated from the short external rotators.  These structures were then  carefully  incised with the Bovie.  Tendinous structures were tagged with  0 Ethibond suture.  The capsule was then easily identified and incised  from the posterior glenoid acetabular lip to the intertrochanteric  region.  There was a clear yellow joint effusion.  Hip was then easily  dislocated posteriorly and then osteotomized about 1 fingerbreadth  proximal to the lesser trochanter using the femoral neck guide.  The  head was then delivered from the wound.  There were large areas of  articular cartilage loss and some areas of frayed articular cartilage.  There was no evidence of AVN.   The femoral canal was then prepared for the femoral component.  Initial  hole was then made into the intertrochanteric and piriformis fossa.  The  canal finder was inserted.  Reaming was performed to 11.5 to accept a 12  mm prosthesis which had been determined and templated preoperatively.  Rasping was sequentially formed to a 12.5 mm small stature femoral  component and then a large stature component.  A calcar reamer was  applied to obtain the appropriate angle.  It was  a very nice fit; there  was no toggling.   Retractor was then placed about the acetabulum.  Labrum was  circumferentially excised.  Reaming was performed to 53 mm to accept a  54 mm component.  We trialed a 52.  It would completely seat but had a  nice tight rim fit.  Accordingly, we elected to use a 54 mm auto-  diameter 100 series component.  This was impacted and was very stable  into the acetabulum.  The trial polyethylene liner was inserted followed  by the femoral component and the 32 mm ball with a +1 neck length.  The  entire construct was reduced.  We had perfect stability and  flexion/extension, internal/external rotation.  I felt that the leg  lengths were symmetrical.  She was __________ preoperatively.   Trial components removed.  The joint was then copiously irrigated with  saline solution.  The apex hole eliminator was inserted  followed by the  marathon polyethylene liner with a 10-degree lip posteriorly.   The femoral canal was then irrigated.  The 12 mm femoral component was  then impacted flush on the calcar.  We cleaned the neck, trialed a +1  neck length 32 mm hip ball with perfect stability.  Accordingly, that  trial ball was removed, and the final metallic head was applied to the  cleaned and dried Morse taper neck.  The acetabulum was inspected  without evidence of loose material.  The entire construct was reduced  and again through full range of motion, we had perfect stability.   Wound was again irrigated with saline solution.  The capsule was closed  anatomically with #1 Ethibond.  Short external rotators closed with a  similar material.  The iliotibial band closed with running #1 Vicryl.  The adipose and subcu were closed in numerous layers with 0 and 2-0  Vicryl, skin closed with skin clips.  Sterile bulky dressing was applied  followed by knee immobilizer.   The patient tolerated without complications.      Claude Manges. Cleophas Dunker, M.D.  Electronically Signed     PWW/MEDQ  D:  09/29/2006  T:  09/29/2006  Job:  [id number]

## 2011-03-25 ENCOUNTER — Encounter (HOSPITAL_COMMUNITY): Payer: Medicare Other

## 2011-03-25 ENCOUNTER — Other Ambulatory Visit: Payer: Self-pay | Admitting: Orthopedic Surgery

## 2011-03-25 LAB — BASIC METABOLIC PANEL
BUN: 23 mg/dL (ref 6–23)
CO2: 26 mEq/L (ref 19–32)
Chloride: 98 mEq/L (ref 96–112)
Creatinine, Ser: 1.25 mg/dL — ABNORMAL HIGH (ref 0.4–1.2)
Glucose, Bld: 297 mg/dL — ABNORMAL HIGH (ref 70–99)

## 2011-03-25 LAB — SURGICAL PCR SCREEN: MRSA, PCR: NEGATIVE

## 2011-03-25 LAB — CBC
HCT: 37.4 % (ref 36.0–46.0)
Hemoglobin: 12 g/dL (ref 12.0–15.0)
MCH: 27.5 pg (ref 26.0–34.0)
MCV: 85.8 fL (ref 78.0–100.0)
RBC: 4.36 MIL/uL (ref 3.87–5.11)

## 2011-04-01 ENCOUNTER — Ambulatory Visit (HOSPITAL_COMMUNITY)
Admission: RE | Admit: 2011-04-01 | Discharge: 2011-04-02 | Disposition: A | Discharge status: Home / Self Care | Payer: Medicare Other | Source: Ambulatory Visit | Attending: Orthopedic Surgery | Admitting: Orthopedic Surgery

## 2011-04-01 DIAGNOSIS — Z01812 Encounter for preprocedural laboratory examination: Secondary | ICD-10-CM | POA: Insufficient documentation

## 2011-04-01 DIAGNOSIS — I10 Essential (primary) hypertension: Secondary | ICD-10-CM | POA: Insufficient documentation

## 2011-04-01 DIAGNOSIS — R059 Cough, unspecified: Secondary | ICD-10-CM | POA: Insufficient documentation

## 2011-04-01 DIAGNOSIS — M67919 Unspecified disorder of synovium and tendon, unspecified shoulder: Secondary | ICD-10-CM | POA: Insufficient documentation

## 2011-04-01 DIAGNOSIS — Z79899 Other long term (current) drug therapy: Secondary | ICD-10-CM | POA: Insufficient documentation

## 2011-04-01 DIAGNOSIS — E119 Type 2 diabetes mellitus without complications: Secondary | ICD-10-CM | POA: Insufficient documentation

## 2011-04-01 DIAGNOSIS — R05 Cough: Secondary | ICD-10-CM | POA: Insufficient documentation

## 2011-04-01 DIAGNOSIS — M719 Bursopathy, unspecified: Secondary | ICD-10-CM | POA: Insufficient documentation

## 2011-04-01 DIAGNOSIS — R110 Nausea: Secondary | ICD-10-CM | POA: Insufficient documentation

## 2011-04-01 LAB — GLUCOSE, CAPILLARY
Glucose-Capillary: 186 mg/dL — ABNORMAL HIGH (ref 70–99)
Glucose-Capillary: 154 mg/dL — ABNORMAL HIGH (ref 70–99)
Glucose-Capillary: 282 mg/dL — ABNORMAL HIGH (ref 70–99)

## 2011-04-02 LAB — GLUCOSE, CAPILLARY

## 2011-04-08 NOTE — Op Note (Signed)
  Theresa Kramer, Theresa Kramer                ACCOUNT NO.:  [id number]  MEDICAL RECORD NO.:  [id number]           PATIENT TYPE:  O  LOCATION:  1612                         FACILITY:  [facility]  PHYSICIAN:  Marlowe Kays, M.D.  DATE OF BIRTH:  Jul 10, 1941  DATE OF PROCEDURE:  04/01/2011 DATE OF DISCHARGE:                              OPERATIVE REPORT   PREOPERATIVE DIAGNOSIS:  Recurrent rotator cuff tear, right shoulder.  POSTOPERATIVE DIAGNOSIS:  Recurrent rotator cuff tear, right shoulder.  OPERATION:  Anterior acromionectomy and repair of recurrent rotator cuff tear, right shoulder.  SURGEON:  Marlowe Kays, M.D.  ASSISTANTDruscilla Brownie. Idolina Primer, P.A.C.  ANESTHESIA:  General preceded by interscalene block.  PLAN AND JUSTIFICATION FOR PROCEDURE:  She had an extensive tear which I repaired on November 18, 2010.  She subsequently has fallen and because of pain I obtained an arthrogram which demonstrated  dye leakage in the midportion of the rotator cuff which led to her coming today for repeat rotator cuff repair.  See all prescription below for additional details.  PROCEDURE NOTE:  Prophylactic antibiotics, interscalene block by anesthesiologist, satisfied general anesthesia, beach-chair position, right shoulder girdle was prepped with DuraPrep and draped in sterile field.  Time-out performed.  I went through the old incision and located the residual anterior acromion and I opened the fascia over it with cutting cautery and then gently undermined the acromion with a small Cobb elevator finding joint fluid, confirming the tear.  After opening the bursa, I found 1 cm x 1 cm tear in the midportion of the rotator cuff with distal cuff remaining.  A rotator cuff suture anchor suture was removed.  It seemed that the best way to manage the tear was to advance it lateralward since the cuff itself was somewhat fibrotic in the area of the tear.  I first performed digital anterior  acromionectomy for decompression and then used a 4 strand Stryker rotator cuff anchor, spliced along the tear, bringing the tear lateralward down to the residual rotator cuff tear on the lateral humerus.  I then tied the suture there and then made additional sutures from the terminal portion into the lateral rotator cuff buttressing it with a second suture line. This seemed to give a nice stable repair with her arm to her side.  I looked and found no other additional areas of tear.  The wound was irrigated sterile saline and the fascia over the anterior acromion and the deltoid interval was closed with interrupted #1 Vicryl, subcu tissue with 2-0 Vicryl, Steri-Strips on the skin.  Dry sterile dressing, shoulder immobilizer applied.  She tolerated the procedure well, was taken to recovery room in satisfactory condition with no known complications.          ______________________________ Marlowe Kays, M.D.     JA/MEDQ  D:  04/01/2011  T:  04/01/2011  Job:  [id number]  Electronically Signed by Marlowe Kays M.D. on 04/08/2011 01:24:29 PM

## 2011-04-29 ENCOUNTER — Other Ambulatory Visit (HOSPITAL_COMMUNITY): Payer: Self-pay | Admitting: Internal Medicine

## 2011-04-29 ENCOUNTER — Ambulatory Visit (HOSPITAL_COMMUNITY)
Admission: RE | Admit: 2011-04-29 | Discharge: 2011-04-29 | Disposition: A | Discharge status: Home / Self Care | Payer: Medicare Other | Source: Ambulatory Visit | Attending: Internal Medicine | Admitting: Internal Medicine

## 2011-04-29 DIAGNOSIS — M545 Low back pain, unspecified: Secondary | ICD-10-CM | POA: Insufficient documentation

## 2011-04-29 DIAGNOSIS — S3210XA Unspecified fracture of sacrum, initial encounter for closed fracture: Secondary | ICD-10-CM | POA: Insufficient documentation

## 2011-04-29 DIAGNOSIS — W19XXXA Unspecified fall, initial encounter: Secondary | ICD-10-CM | POA: Insufficient documentation

## 2011-04-29 DIAGNOSIS — M5137 Other intervertebral disc degeneration, lumbosacral region: Secondary | ICD-10-CM | POA: Insufficient documentation

## 2011-04-29 DIAGNOSIS — S322XXA Fracture of coccyx, initial encounter for closed fracture: Secondary | ICD-10-CM | POA: Insufficient documentation

## 2011-04-29 DIAGNOSIS — M533 Sacrococcygeal disorders, not elsewhere classified: Secondary | ICD-10-CM | POA: Insufficient documentation

## 2011-04-29 DIAGNOSIS — M51379 Other intervertebral disc degeneration, lumbosacral region without mention of lumbar back pain or lower extremity pain: Secondary | ICD-10-CM | POA: Insufficient documentation

## 2011-05-02 ENCOUNTER — Other Ambulatory Visit (HOSPITAL_COMMUNITY): Payer: Self-pay | Admitting: Internal Medicine

## 2011-05-02 DIAGNOSIS — S0990XA Unspecified injury of head, initial encounter: Secondary | ICD-10-CM

## 2011-05-02 DIAGNOSIS — W19XXXA Unspecified fall, initial encounter: Secondary | ICD-10-CM

## 2011-05-03 ENCOUNTER — Other Ambulatory Visit (HOSPITAL_COMMUNITY): Payer: Self-pay | Admitting: Internal Medicine

## 2011-05-03 ENCOUNTER — Ambulatory Visit (HOSPITAL_COMMUNITY)
Admission: RE | Admit: 2011-05-03 | Discharge: 2011-05-03 | Disposition: A | Discharge status: Home / Self Care | Payer: Medicare Other | Source: Ambulatory Visit | Attending: Internal Medicine | Admitting: Internal Medicine

## 2011-05-03 DIAGNOSIS — R112 Nausea with vomiting, unspecified: Secondary | ICD-10-CM | POA: Insufficient documentation

## 2011-05-03 DIAGNOSIS — R519 Headache, unspecified: Secondary | ICD-10-CM

## 2011-05-03 DIAGNOSIS — R51 Headache: Secondary | ICD-10-CM | POA: Insufficient documentation

## 2011-05-05 ENCOUNTER — Ambulatory Visit (HOSPITAL_COMMUNITY): Admission: RE | Admit: 2011-05-05 | Payer: Medicare Other | Source: Ambulatory Visit

## 2011-07-11 ENCOUNTER — Other Ambulatory Visit: Payer: Self-pay | Admitting: Orthopedic Surgery

## 2011-07-11 DIAGNOSIS — M25511 Pain in right shoulder: Secondary | ICD-10-CM

## 2011-07-16 ENCOUNTER — Ambulatory Visit
Admission: RE | Admit: 2011-07-16 | Discharge: 2011-07-16 | Disposition: A | Discharge status: Home / Self Care | Payer: Medicare Other | Source: Ambulatory Visit | Attending: Orthopedic Surgery | Admitting: Orthopedic Surgery

## 2011-07-16 DIAGNOSIS — M25511 Pain in right shoulder: Secondary | ICD-10-CM

## 2011-07-16 MED ORDER — IOHEXOL 180 MG/ML  SOLN
10.0000 mL | Freq: Once | INTRAMUSCULAR | Status: AC | PRN
  Administered 2011-07-16: 10 mL via INTRA_ARTICULAR

## 2011-08-14 ENCOUNTER — Other Ambulatory Visit (HOSPITAL_COMMUNITY): Payer: Self-pay | Admitting: Internal Medicine

## 2011-08-14 DIAGNOSIS — Z139 Encounter for screening, unspecified: Secondary | ICD-10-CM

## 2011-08-15 ENCOUNTER — Encounter (HOSPITAL_COMMUNITY): Payer: Self-pay | Admitting: *Deleted

## 2011-09-08 ENCOUNTER — Ambulatory Visit (HOSPITAL_COMMUNITY)
Admission: RE | Admit: 2011-09-08 | Discharge: 2011-09-08 | Disposition: A | Discharge status: Home / Self Care | Payer: Medicare Other | Source: Ambulatory Visit | Attending: Internal Medicine | Admitting: Internal Medicine

## 2011-09-08 DIAGNOSIS — Z139 Encounter for screening, unspecified: Secondary | ICD-10-CM

## 2011-09-08 DIAGNOSIS — Z1231 Encounter for screening mammogram for malignant neoplasm of breast: Secondary | ICD-10-CM | POA: Insufficient documentation

## 2011-09-19 ENCOUNTER — Ambulatory Visit (HOSPITAL_COMMUNITY): Admission: RE | Admit: 2011-09-19 | Payer: Medicare Other | Source: Ambulatory Visit | Admitting: Orthopedic Surgery

## 2011-09-19 ENCOUNTER — Encounter (HOSPITAL_COMMUNITY): Admission: RE | Payer: Self-pay | Source: Ambulatory Visit

## 2011-09-19 SURGERY — REPAIR, ROTATOR CUFF, OPEN
Anesthesia: General | Laterality: Right

## 2011-11-10 ENCOUNTER — Encounter: Payer: Self-pay | Admitting: *Deleted

## 2011-11-10 ENCOUNTER — Emergency Department (HOSPITAL_COMMUNITY): Payer: Medicare Other

## 2011-11-10 ENCOUNTER — Other Ambulatory Visit: Payer: Self-pay | Admitting: Internal Medicine

## 2011-11-10 ENCOUNTER — Emergency Department (HOSPITAL_COMMUNITY)
Admission: EM | Admit: 2011-11-10 | Discharge: 2011-11-10 | Disposition: A | Discharge status: Home / Self Care | Payer: Medicare Other | Attending: Internal Medicine | Admitting: Internal Medicine

## 2011-11-10 DIAGNOSIS — R109 Unspecified abdominal pain: Secondary | ICD-10-CM | POA: Insufficient documentation

## 2011-11-10 DIAGNOSIS — R110 Nausea: Secondary | ICD-10-CM | POA: Insufficient documentation

## 2011-11-10 DIAGNOSIS — Z79899 Other long term (current) drug therapy: Secondary | ICD-10-CM | POA: Insufficient documentation

## 2011-11-10 DIAGNOSIS — Z7982 Long term (current) use of aspirin: Secondary | ICD-10-CM | POA: Insufficient documentation

## 2011-11-10 HISTORY — DX: Essential (primary) hypertension: I10

## 2011-11-10 LAB — DIFFERENTIAL
Eosinophils Relative: 3 % (ref 0–5)
Lymphocytes Relative: 27 % (ref 12–46)
Lymphs Abs: 2.3 10*3/uL (ref 0.7–4.0)
Neutro Abs: 5.4 10*3/uL (ref 1.7–7.7)

## 2011-11-10 LAB — CBC
MCV: 88.4 fL (ref 78.0–100.0)
Platelets: 301 10*3/uL (ref 150–400)
RBC: 4.32 MIL/uL (ref 3.87–5.11)
WBC: 8.6 10*3/uL (ref 4.0–10.5)

## 2011-11-10 LAB — COMPREHENSIVE METABOLIC PANEL
ALT: 20 U/L (ref 0–35)
AST: 13 U/L (ref 0–37)
Alkaline Phosphatase: 56 U/L (ref 39–117)
CO2: 28 mEq/L (ref 19–32)
Calcium: 10 mg/dL (ref 8.4–10.5)
Chloride: 105 mEq/L (ref 96–112)
GFR calc Af Amer: 52 mL/min — ABNORMAL LOW (ref >90)
GFR calc non Af Amer: 45 mL/min — ABNORMAL LOW (ref >90)
Glucose, Bld: 180 mg/dL — ABNORMAL HIGH (ref 70–99)
Potassium: 4 mEq/L (ref 3.5–5.1)
Sodium: 143 mEq/L (ref 135–145)
Total Bilirubin: 0.2 mg/dL — ABNORMAL LOW (ref 0.3–1.2)

## 2011-11-10 LAB — URINALYSIS, ROUTINE W REFLEX MICROSCOPIC
Bilirubin Urine: NEGATIVE
Glucose, UA: 500 mg/dL — AB
Hgb urine dipstick: NEGATIVE
Protein, ur: NEGATIVE mg/dL
Urobilinogen, UA: 0.2 mg/dL (ref 0.0–1.0)

## 2011-11-10 MED ORDER — IOHEXOL 300 MG/ML  SOLN
100.0000 mL | Freq: Once | INTRAMUSCULAR | Status: AC | PRN
  Administered 2011-11-10: 100 mL via INTRAVENOUS

## 2011-11-10 MED ORDER — SODIUM CHLORIDE 0.9 % IV SOLN
Freq: Once | INTRAVENOUS | Status: AC
  Administered 2011-11-10: 10 mL via INTRAVENOUS

## 2011-11-10 MED ORDER — ONDANSETRON HCL 4 MG/2ML IJ SOLN
4.0000 mg | Freq: Once | INTRAMUSCULAR | Status: AC
  Administered 2011-11-10: 4 mg via INTRAVENOUS
  Filled 2011-11-10: qty 2

## 2011-11-10 MED ORDER — MORPHINE SULFATE 2 MG/ML IJ SOLN: 2.0000 mg | INTRAMUSCULAR | Status: DC | PRN

## 2011-11-10 NOTE — ED Notes (Signed)
Dr. Ouida Sills called and made aware of all results are final.

## 2011-11-10 NOTE — ED Notes (Signed)
Abdominal pain with vomiting 

## 2011-11-10 NOTE — ED Notes (Signed)
Pt transferred to Xray.

## 2011-11-10 NOTE — ED Notes (Signed)
Spoke with Dr. Ouida Sills- he will be seeing pt in ER directly. Wants to be called with CT scan results.

## 2011-11-10 NOTE — ED Notes (Signed)
Verbal order taken from Dr. Ouida Sills for dc home.

## 2011-11-10 NOTE — ED Notes (Signed)
Pt currently at CT.

## 2011-11-15 NOTE — Progress Notes (Signed)
[name], Theresa Kramer                ACCOUNT NO.:  [id number]  MEDICAL RECORD NO.:  [id number]  LOCATION:  APA08                         FACILITY:  [facility]  PHYSICIAN:  Kingsley Callander. Ouida Sills, MD       DATE OF BIRTH:  October 28, 1941  DATE OF PROCEDURE:  11/10/2011 DATE OF DISCHARGE:  11/10/2011                                PROGRESS NOTE   [name] was evaluated initially at the office and then at the emergency room after presenting with abdominal pain, which she initially reported as starting the night before presentation.  After her daughter arrived to the emergency room, thought it was learned that her pain had started 2 weeks prior.  She described awakening in the night with nausea and vomiting.  She did not experience hematemesis.  She has not had melena or rectal bleeding.  There were no urinary tract symptoms.  On initial evaluation at the office, she was extremely tender in the mid abdomen.  She was sent over to the emergency room for further evaluation.  GENERAL:  On followup exam she appeared comfortable. VITAL SIGNS:  Initial temperature was 98.3, blood pressure 132/70, pulse 84.  HEENT:  No scleral icterus.  Pharynx was moist. NECK:  No JVD. LUNGS:  Clear. HEART:  Regular with no murmurs. ABDOMEN.  Tender in the mid abdomen with no palpable mass or hepatosplenomegaly.  No CVA tenderness. EXTREMITIES:  No edema. NEURO:  Stable.  IMPRESSION/PLAN:  Abdominal pain.  Her white count was normal at 8.6 with a normal hemoglobin of 12.2.  LFTs were normal.  Lipase was 55. Urinalysis revealed evidence of UTI with many bacteria, 7-10 wbc's, positive nitrite, and trace ketones.  Glucose was present in the urine. Serum glucose was 180.  Her chest x-ray revealed no acute abnormality. A CT scan of the abdomen and pelvis revealed diffuse esophageal wall thickening distally, raising the question of esophagitis.  No acute findings were otherwise present.  The liver, gallbladder and  pancreas appeared normal.  Her daughter provided additional history that the patient had recently been taking Mobic and prednisone and had been off of her Nexium.  She was therefore felt to likely be experiencing pain and vomiting from esophagitis. Mobic was stopped.  She has already stopped prednisone.  She will restart Nexium 40 mg b.i.d. for 1 week, then daily and will follow up in 2 weeks.  She will be treated for UTI with Bactrim DS b.i.d. for 3 days.     Kingsley Callander. Ouida Sills, MD     ROF/MEDQ  D:  11/14/2011  T:  11/15/2011  Job:  [id number]

## 2011-12-08 ENCOUNTER — Other Ambulatory Visit (HOSPITAL_COMMUNITY): Payer: Self-pay | Admitting: Internal Medicine

## 2011-12-08 DIAGNOSIS — S3210XA Unspecified fracture of sacrum, initial encounter for closed fracture: Secondary | ICD-10-CM

## 2011-12-11 ENCOUNTER — Ambulatory Visit (HOSPITAL_COMMUNITY)
Admission: RE | Admit: 2011-12-11 | Discharge: 2011-12-11 | Disposition: A | Discharge status: Home / Self Care | Payer: Medicare Other | Source: Ambulatory Visit | Attending: Internal Medicine | Admitting: Internal Medicine

## 2011-12-11 DIAGNOSIS — M948X9 Other specified disorders of cartilage, unspecified sites: Secondary | ICD-10-CM | POA: Insufficient documentation

## 2011-12-11 DIAGNOSIS — S3210XA Unspecified fracture of sacrum, initial encounter for closed fracture: Secondary | ICD-10-CM

## 2012-03-05 ENCOUNTER — Other Ambulatory Visit: Payer: Self-pay | Admitting: Neurosurgery

## 2012-03-11 ENCOUNTER — Encounter (HOSPITAL_COMMUNITY): Payer: Self-pay

## 2012-03-11 ENCOUNTER — Encounter (HOSPITAL_COMMUNITY)
Admission: RE | Admit: 2012-03-11 | Discharge: 2012-03-11 | Disposition: A | Discharge status: Home / Self Care | Payer: Medicare Other | Source: Ambulatory Visit | Attending: Neurosurgery | Admitting: Neurosurgery

## 2012-03-11 HISTORY — DX: Depression, unspecified: F32.A

## 2012-03-11 HISTORY — DX: Personal history of other diseases of the digestive system: Z87.19

## 2012-03-11 HISTORY — DX: Unspecified osteoarthritis, unspecified site: M19.90

## 2012-03-11 HISTORY — DX: Gastro-esophageal reflux disease without esophagitis: K21.9

## 2012-03-11 HISTORY — DX: Bronchitis, not specified as acute or chronic: J40

## 2012-03-11 HISTORY — DX: Other specified postprocedural states: R11.2

## 2012-03-11 HISTORY — DX: Urinary tract infection, site not specified: N39.0

## 2012-03-11 HISTORY — DX: Major depressive disorder, single episode, unspecified: F32.9

## 2012-03-11 HISTORY — DX: Other complications of anesthesia, initial encounter: T88.59XA

## 2012-03-11 HISTORY — DX: Cervical root disorders, not elsewhere classified: G54.2

## 2012-03-11 HISTORY — DX: Other specified postprocedural states: Z98.890

## 2012-03-11 HISTORY — DX: Adverse effect of unspecified anesthetic, initial encounter: T41.45XA

## 2012-03-11 HISTORY — DX: Hyperlipidemia, unspecified: E78.5

## 2012-03-11 LAB — CBC
HCT: 39.8 % (ref 36.0–46.0)
MCH: 28.9 pg (ref 26.0–34.0)
MCV: 87.3 fL (ref 78.0–100.0)
RBC: 4.56 MIL/uL (ref 3.87–5.11)
RDW: 13.6 % (ref 11.5–15.5)
WBC: 6.7 10*3/uL (ref 4.0–10.5)

## 2012-03-11 LAB — BASIC METABOLIC PANEL
CO2: 25 mEq/L (ref 19–32)
Chloride: 97 mEq/L (ref 96–112)
Creatinine, Ser: 0.66 mg/dL (ref 0.50–1.10)
Glucose, Bld: 200 mg/dL — ABNORMAL HIGH (ref 70–99)

## 2012-03-11 NOTE — Pre-Procedure Instructions (Signed)
20 ELNOR RENOVATO  03/11/2012   Your procedure is scheduled on:  Monday Mar 15, 2012  Report to [facility] at 0730 AM.  Call this number if you have problems the morning of surgery: [phone]   Remember:   Do not eat food:After Midnight.  May have clear liquids: up to 4 Hours before arrival. (up to 3:30am)  Clear liquids include soda, tea, black coffee, apple or grape juice, broth.  Take these medicines the morning of surgery with A SIP OF WATER: celexa, nexium, gabapentin, oxycodone, ditropan, mirapex, tramadol   Do not wear jewelry, make-up or nail polish.  Do not wear lotions, powders, or perfumes. You may wear deodorant.  Do not shave 48 hours prior to surgery.  Do not bring valuables to the hospital.  Contacts, dentures or bridgework may not be worn into surgery.  Leave suitcase in the car. After surgery it may be brought to your room.  For patients admitted to the hospital, checkout time is 11:00 AM the day of discharge.   Patients discharged the day of surgery will not be allowed to drive home.  Name and phone number of your driver: Juliann Pulse [phone]  Special Instructions: CHG Shower Use Special Wash: 1/2 bottle night before surgery and 1/2 bottle morning of surgery.   Please read over the following fact sheets that you were given: Pain Booklet, Coughing and Deep Breathing, MRSA Information and Surgical Site Infection Prevention

## 2012-03-15 ENCOUNTER — Inpatient Hospital Stay (HOSPITAL_COMMUNITY): Payer: Medicare Other

## 2012-03-15 ENCOUNTER — Encounter (HOSPITAL_COMMUNITY): Payer: Self-pay | Admitting: *Deleted

## 2012-03-15 ENCOUNTER — Inpatient Hospital Stay (HOSPITAL_COMMUNITY): Payer: Medicare Other | Admitting: *Deleted

## 2012-03-15 ENCOUNTER — Encounter (HOSPITAL_COMMUNITY)
Admission: RE | Disposition: A | Discharge status: Home / Self Care | Payer: Self-pay | Source: Ambulatory Visit | Attending: Neurosurgery

## 2012-03-15 ENCOUNTER — Inpatient Hospital Stay (HOSPITAL_COMMUNITY)
Admission: RE | Admit: 2012-03-15 | Discharge: 2012-03-16 | DRG: 472 | Disposition: A | Discharge status: Home / Self Care | Payer: Medicare Other | Source: Ambulatory Visit | Attending: Neurosurgery | Admitting: Neurosurgery

## 2012-03-15 DIAGNOSIS — I10 Essential (primary) hypertension: Secondary | ICD-10-CM | POA: Diagnosis present

## 2012-03-15 DIAGNOSIS — Z888 Allergy status to other drugs, medicaments and biological substances status: Secondary | ICD-10-CM

## 2012-03-15 DIAGNOSIS — Z87891 Personal history of nicotine dependence: Secondary | ICD-10-CM

## 2012-03-15 DIAGNOSIS — E785 Hyperlipidemia, unspecified: Secondary | ICD-10-CM | POA: Diagnosis present

## 2012-03-15 DIAGNOSIS — Z794 Long term (current) use of insulin: Secondary | ICD-10-CM

## 2012-03-15 DIAGNOSIS — Z9119 Patient's noncompliance with other medical treatment and regimen: Secondary | ICD-10-CM

## 2012-03-15 DIAGNOSIS — Z9849 Cataract extraction status, unspecified eye: Secondary | ICD-10-CM

## 2012-03-15 DIAGNOSIS — Z91199 Patient's noncompliance with other medical treatment and regimen due to unspecified reason: Secondary | ICD-10-CM

## 2012-03-15 DIAGNOSIS — M5000 Cervical disc disorder with myelopathy, unspecified cervical region: Secondary | ICD-10-CM | POA: Diagnosis present

## 2012-03-15 DIAGNOSIS — Z79899 Other long term (current) drug therapy: Secondary | ICD-10-CM

## 2012-03-15 DIAGNOSIS — M5020 Other cervical disc displacement, unspecified cervical region: Secondary | ICD-10-CM

## 2012-03-15 DIAGNOSIS — M129 Arthropathy, unspecified: Secondary | ICD-10-CM | POA: Diagnosis present

## 2012-03-15 DIAGNOSIS — Z96619 Presence of unspecified artificial shoulder joint: Secondary | ICD-10-CM

## 2012-03-15 DIAGNOSIS — K219 Gastro-esophageal reflux disease without esophagitis: Secondary | ICD-10-CM | POA: Diagnosis present

## 2012-03-15 DIAGNOSIS — Z7982 Long term (current) use of aspirin: Secondary | ICD-10-CM

## 2012-03-15 DIAGNOSIS — E119 Type 2 diabetes mellitus without complications: Secondary | ICD-10-CM | POA: Diagnosis present

## 2012-03-15 DIAGNOSIS — M4712 Other spondylosis with myelopathy, cervical region: Principal | ICD-10-CM | POA: Diagnosis present

## 2012-03-15 HISTORY — DX: Patient's other noncompliance with medication regimen for other reason: Z91.148

## 2012-03-15 HISTORY — PX: ANTERIOR CERVICAL DECOMP/DISCECTOMY FUSION: SHX1161

## 2012-03-15 HISTORY — DX: Patient's other noncompliance with medication regimen: Z91.14

## 2012-03-15 LAB — GLUCOSE, CAPILLARY
Glucose-Capillary: 106 mg/dL — ABNORMAL HIGH (ref 70–99)
Glucose-Capillary: 215 mg/dL — ABNORMAL HIGH (ref 70–99)

## 2012-03-15 SURGERY — ANTERIOR CERVICAL DECOMPRESSION/DISCECTOMY FUSION 2 LEVELS
Anesthesia: General

## 2012-03-15 MED ORDER — HYDROMORPHONE HCL PF 1 MG/ML IJ SOLN
INTRAMUSCULAR | Status: AC
  Filled 2012-03-15: qty 1

## 2012-03-15 MED ORDER — INSULIN ASPART 100 UNIT/ML ~~LOC~~ SOLN: 0.0000 [IU] | SUBCUTANEOUS | Status: DC

## 2012-03-15 MED ORDER — CHLORTHALIDONE 25 MG PO TABS
25.0000 mg | ORAL_TABLET | Freq: Every day | ORAL | Status: DC
  Administered 2012-03-15 – 2012-03-16 (×2): 25 mg via ORAL
  Filled 2012-03-15 (×2): qty 1

## 2012-03-15 MED ORDER — CITALOPRAM HYDROBROMIDE 20 MG PO TABS
20.0000 mg | ORAL_TABLET | Freq: Every day | ORAL | Status: DC
  Administered 2012-03-15 – 2012-03-16 (×2): 20 mg via ORAL
  Filled 2012-03-15 (×2): qty 1

## 2012-03-15 MED ORDER — GEMFIBROZIL 600 MG PO TABS
600.0000 mg | ORAL_TABLET | Freq: Two times a day (BID) | ORAL | Status: DC
  Administered 2012-03-15 – 2012-03-16 (×2): 600 mg via ORAL
  Filled 2012-03-15 (×4): qty 1

## 2012-03-15 MED ORDER — BUPIVACAINE-EPINEPHRINE PF 0.5-1:200000 % IJ SOLN
INTRAMUSCULAR | Status: DC | PRN
  Administered 2012-03-15: 10 mL

## 2012-03-15 MED ORDER — LIDOCAINE HCL (CARDIAC) 20 MG/ML IV SOLN
INTRAVENOUS | Status: DC | PRN
  Administered 2012-03-15: 100 mg via INTRAVENOUS

## 2012-03-15 MED ORDER — LACTATED RINGERS IV SOLN
INTRAVENOUS | Status: DC | PRN
  Administered 2012-03-15 (×2): via INTRAVENOUS

## 2012-03-15 MED ORDER — CEFAZOLIN SODIUM-DEXTROSE 2-3 GM-% IV SOLR
2.0000 g | Freq: Three times a day (TID) | INTRAVENOUS | Status: AC
  Administered 2012-03-15 – 2012-03-16 (×2): 2 g via INTRAVENOUS
  Filled 2012-03-15 (×2): qty 50

## 2012-03-15 MED ORDER — CEFAZOLIN SODIUM 1-5 GM-% IV SOLN
INTRAVENOUS | Status: DC | PRN
  Administered 2012-03-15: 2 g via INTRAVENOUS

## 2012-03-15 MED ORDER — ONDANSETRON HCL 4 MG/2ML IJ SOLN
4.0000 mg | Freq: Once | INTRAMUSCULAR | Status: DC | PRN
Start: 2012-03-15 — End: 2012-03-15

## 2012-03-15 MED ORDER — SIMVASTATIN 20 MG PO TABS
20.0000 mg | ORAL_TABLET | Freq: Every day | ORAL | Status: DC
  Administered 2012-03-15: 20 mg via ORAL
  Filled 2012-03-15 (×2): qty 1

## 2012-03-15 MED ORDER — BACITRACIN 50000 UNITS IM SOLR
INTRAMUSCULAR | Status: AC
  Filled 2012-03-15: qty 1

## 2012-03-15 MED ORDER — PROPOFOL 10 MG/ML IV EMUL
INTRAVENOUS | Status: DC | PRN
  Administered 2012-03-15: 110 mg via INTRAVENOUS

## 2012-03-15 MED ORDER — ROCURONIUM BROMIDE 100 MG/10ML IV SOLN
INTRAVENOUS | Status: DC | PRN
  Administered 2012-03-15: 50 mg via INTRAVENOUS

## 2012-03-15 MED ORDER — PROMETHAZINE HCL 25 MG PO TABS: 25.0000 mg | ORAL_TABLET | Freq: Four times a day (QID) | ORAL | Status: DC | PRN

## 2012-03-15 MED ORDER — SODIUM CHLORIDE 0.9 % IR SOLN
Status: DC | PRN
  Administered 2012-03-15: 11:00:00

## 2012-03-15 MED ORDER — LISINOPRIL 40 MG PO TABS
40.0000 mg | ORAL_TABLET | Freq: Every day | ORAL | Status: DC
  Administered 2012-03-15 – 2012-03-16 (×2): 40 mg via ORAL
  Filled 2012-03-15 (×2): qty 1

## 2012-03-15 MED ORDER — BENZONATATE 100 MG PO CAPS
200.0000 mg | ORAL_CAPSULE | Freq: Three times a day (TID) | ORAL | Status: DC | PRN
  Filled 2012-03-15: qty 2

## 2012-03-15 MED ORDER — 0.9 % SODIUM CHLORIDE (POUR BTL) OPTIME
TOPICAL | Status: DC | PRN
  Administered 2012-03-15: 1000 mL

## 2012-03-15 MED ORDER — INSULIN GLARGINE 100 UNIT/ML ~~LOC~~ SOLN
60.0000 [IU] | Freq: Every day | SUBCUTANEOUS | Status: DC
  Administered 2012-03-15: 60 [IU] via SUBCUTANEOUS

## 2012-03-15 MED ORDER — ONDANSETRON HCL 4 MG/2ML IJ SOLN
INTRAMUSCULAR | Status: DC | PRN
  Administered 2012-03-15 (×2): 4 mg via INTRAVENOUS

## 2012-03-15 MED ORDER — OXYCODONE-ACETAMINOPHEN 5-325 MG PO TABS
1.0000 | ORAL_TABLET | ORAL | Status: DC | PRN
  Administered 2012-03-15: 2 via ORAL
  Administered 2012-03-16: 1 via ORAL
  Filled 2012-03-15: qty 2
  Filled 2012-03-15: qty 1

## 2012-03-15 MED ORDER — MORPHINE SULFATE 4 MG/ML IJ SOLN: 0.0500 mg/kg | INTRAMUSCULAR | Status: DC | PRN

## 2012-03-15 MED ORDER — MORPHINE SULFATE 4 MG/ML IJ SOLN
0.0500 mg/kg | INTRAMUSCULAR | Status: DC | PRN
  Administered 2012-03-15: 4 mg via INTRAVENOUS

## 2012-03-15 MED ORDER — HYDROMORPHONE HCL PF 1 MG/ML IJ SOLN
0.2500 mg | INTRAMUSCULAR | Status: DC | PRN
  Administered 2012-03-15 (×4): 0.5 mg via INTRAVENOUS

## 2012-03-15 MED ORDER — ONDANSETRON HCL 4 MG/2ML IJ SOLN: 4.0000 mg | INTRAMUSCULAR | Status: DC | PRN

## 2012-03-15 MED ORDER — MENTHOL 3 MG MT LOZG: 1.0000 | LOZENGE | OROMUCOSAL | Status: DC | PRN

## 2012-03-15 MED ORDER — PHENYLEPHRINE HCL 10 MG/ML IJ SOLN
INTRAMUSCULAR | Status: DC | PRN
  Administered 2012-03-15: 40 ug via INTRAVENOUS
  Administered 2012-03-15: 120 ug via INTRAVENOUS
  Administered 2012-03-15: 80 ug via INTRAVENOUS
  Administered 2012-03-15 (×3): 120 ug via INTRAVENOUS
  Administered 2012-03-15: 80 ug via INTRAVENOUS

## 2012-03-15 MED ORDER — MIDAZOLAM HCL 5 MG/5ML IJ SOLN
INTRAMUSCULAR | Status: DC | PRN
  Administered 2012-03-15: 2 mg via INTRAVENOUS

## 2012-03-15 MED ORDER — MORPHINE SULFATE 2 MG/ML IJ SOLN: 1.0000 mg | INTRAMUSCULAR | Status: DC | PRN

## 2012-03-15 MED ORDER — ACETAMINOPHEN 650 MG RE SUPP: 650.0000 mg | RECTAL | Status: DC | PRN

## 2012-03-15 MED ORDER — OXYBUTYNIN CHLORIDE ER 10 MG PO TB24
10.0000 mg | ORAL_TABLET | Freq: Two times a day (BID) | ORAL | Status: DC
Start: 2012-03-15 — End: 2012-03-16
  Administered 2012-03-15 – 2012-03-16 (×2): 10 mg via ORAL
  Filled 2012-03-15 (×3): qty 1

## 2012-03-15 MED ORDER — CEFAZOLIN SODIUM-DEXTROSE 2-3 GM-% IV SOLR
INTRAVENOUS | Status: AC
  Filled 2012-03-15: qty 50

## 2012-03-15 MED ORDER — DIAZEPAM 5 MG PO TABS
5.0000 mg | ORAL_TABLET | Freq: Four times a day (QID) | ORAL | Status: DC | PRN
  Administered 2012-03-15 – 2012-03-16 (×3): 5 mg via ORAL
  Filled 2012-03-15 (×3): qty 1

## 2012-03-15 MED ORDER — NEOSTIGMINE METHYLSULFATE 1 MG/ML IJ SOLN
INTRAMUSCULAR | Status: DC | PRN
  Administered 2012-03-15: 3 mg via INTRAVENOUS

## 2012-03-15 MED ORDER — HEMOSTATIC AGENTS (NO CHARGE) OPTIME
TOPICAL | Status: DC | PRN
  Administered 2012-03-15: 1 via TOPICAL

## 2012-03-15 MED ORDER — METFORMIN HCL 500 MG PO TABS
1000.0000 mg | ORAL_TABLET | Freq: Two times a day (BID) | ORAL | Status: DC
  Administered 2012-03-15 – 2012-03-16 (×2): 1000 mg via ORAL
  Filled 2012-03-15 (×4): qty 2

## 2012-03-15 MED ORDER — PRAMIPEXOLE DIHYDROCHLORIDE 0.25 MG PO TABS
0.2500 mg | ORAL_TABLET | Freq: Every day | ORAL | Status: DC
Start: 2012-03-15 — End: 2012-03-16
  Administered 2012-03-15 – 2012-03-16 (×2): 0.25 mg via ORAL
  Filled 2012-03-15 (×2): qty 1

## 2012-03-15 MED ORDER — LIDOCAINE HCL 4 % MT SOLN
OROMUCOSAL | Status: DC | PRN
  Administered 2012-03-15: 4 mL via TOPICAL

## 2012-03-15 MED ORDER — GABAPENTIN 300 MG PO CAPS
300.0000 mg | ORAL_CAPSULE | Freq: Every day | ORAL | Status: DC
  Administered 2012-03-15 – 2012-03-16 (×2): 300 mg via ORAL
  Filled 2012-03-15 (×2): qty 1

## 2012-03-15 MED ORDER — HYDROCODONE-ACETAMINOPHEN 5-325 MG PO TABS: 1.0000 | ORAL_TABLET | ORAL | Status: DC | PRN

## 2012-03-15 MED ORDER — DEXAMETHASONE 4 MG PO TABS
4.0000 mg | ORAL_TABLET | Freq: Four times a day (QID) | ORAL | Status: AC
  Administered 2012-03-15: 4 mg via ORAL
  Filled 2012-03-15: qty 1

## 2012-03-15 MED ORDER — SODIUM CHLORIDE 0.9 % IV SOLN
INTRAVENOUS | Status: AC
  Filled 2012-03-15: qty 500

## 2012-03-15 MED ORDER — MECLIZINE HCL 25 MG PO TABS
25.0000 mg | ORAL_TABLET | Freq: Three times a day (TID) | ORAL | Status: DC | PRN
  Filled 2012-03-15: qty 1

## 2012-03-15 MED ORDER — PANTOPRAZOLE SODIUM 40 MG PO TBEC
80.0000 mg | DELAYED_RELEASE_TABLET | Freq: Every day | ORAL | Status: DC
  Administered 2012-03-15 – 2012-03-16 (×2): 80 mg via ORAL
  Filled 2012-03-15 (×2): qty 2

## 2012-03-15 MED ORDER — DEXAMETHASONE SODIUM PHOSPHATE 4 MG/ML IJ SOLN
2.0000 mg | Freq: Four times a day (QID) | INTRAMUSCULAR | Status: AC
  Administered 2012-03-16: 2 mg via INTRAVENOUS
  Filled 2012-03-15: qty 1

## 2012-03-15 MED ORDER — INSULIN ASPART 100 UNIT/ML ~~LOC~~ SOLN
20.0000 [IU] | Freq: Three times a day (TID) | SUBCUTANEOUS | Status: DC
  Administered 2012-03-15 – 2012-03-16 (×2): 20 [IU] via SUBCUTANEOUS

## 2012-03-15 MED ORDER — ZOLPIDEM TARTRATE 5 MG PO TABS: 5.0000 mg | ORAL_TABLET | Freq: Every evening | ORAL | Status: DC | PRN

## 2012-03-15 MED ORDER — DOCUSATE SODIUM 100 MG PO CAPS
100.0000 mg | ORAL_CAPSULE | Freq: Two times a day (BID) | ORAL | Status: DC
  Administered 2012-03-15 – 2012-03-16 (×2): 100 mg via ORAL
  Filled 2012-03-15 (×2): qty 1

## 2012-03-15 MED ORDER — MORPHINE SULFATE 4 MG/ML IJ SOLN
INTRAMUSCULAR | Status: AC
  Filled 2012-03-15: qty 1

## 2012-03-15 MED ORDER — DEXAMETHASONE SODIUM PHOSPHATE 4 MG/ML IJ SOLN
INTRAMUSCULAR | Status: DC | PRN
  Administered 2012-03-15: 8 mg via INTRAVENOUS

## 2012-03-15 MED ORDER — EPHEDRINE SULFATE 50 MG/ML IJ SOLN
INTRAMUSCULAR | Status: DC | PRN
  Administered 2012-03-15 (×2): 10 mg via INTRAVENOUS

## 2012-03-15 MED ORDER — ACETAMINOPHEN 325 MG PO TABS: 650.0000 mg | ORAL_TABLET | ORAL | Status: DC | PRN

## 2012-03-15 MED ORDER — GLIPIZIDE ER 5 MG PO TB24
5.0000 mg | ORAL_TABLET | Freq: Every day | ORAL | Status: DC
  Administered 2012-03-16: 5 mg via ORAL
  Filled 2012-03-15: qty 1

## 2012-03-15 MED ORDER — GLYCOPYRROLATE 0.2 MG/ML IJ SOLN
INTRAMUSCULAR | Status: DC | PRN
  Administered 2012-03-15: 0.4 mg via INTRAVENOUS

## 2012-03-15 MED ORDER — FENTANYL CITRATE 0.05 MG/ML IJ SOLN
INTRAMUSCULAR | Status: DC | PRN
  Administered 2012-03-15: 150 ug via INTRAVENOUS
  Administered 2012-03-15 (×2): 50 ug via INTRAVENOUS

## 2012-03-15 MED ORDER — LACTATED RINGERS IV SOLN: INTRAVENOUS | Status: DC

## 2012-03-15 MED ORDER — PHENOL 1.4 % MT LIQD: 1.0000 | OROMUCOSAL | Status: DC | PRN

## 2012-03-15 MED ORDER — SCOPOLAMINE 1 MG/3DAYS TD PT72
MEDICATED_PATCH | TRANSDERMAL | Status: DC | PRN
  Administered 2012-03-15: 1 via TRANSDERMAL

## 2012-03-15 MED ORDER — THROMBIN 5000 UNITS EX SOLR
CUTANEOUS | Status: DC | PRN
  Administered 2012-03-15 (×2): 5000 [IU] via TOPICAL

## 2012-03-15 SURGICAL SUPPLY — 60 items
BAG DECANTER FOR FLEXI CONT (MISCELLANEOUS) ×2 IMPLANT
BENZOIN TINCTURE PRP APPL 2/3 (GAUZE/BANDAGES/DRESSINGS) ×2 IMPLANT
BIT DRILL SPINE QC 12 (BIT) ×2 IMPLANT
BLADE SURG 15 STRL LF DISP TIS (BLADE) ×1 IMPLANT
BLADE SURG 15 STRL SS (BLADE) ×1
BLADE ULTRA TIP 2M (BLADE) ×2 IMPLANT
BRUSH SCRUB EZ PLAIN DRY (MISCELLANEOUS) ×2 IMPLANT
BUR BARREL STRAIGHT FLUTE 4.0 (BURR) ×2 IMPLANT
BUR MATCHSTICK NEURO 3.0 LAGG (BURR) ×2 IMPLANT
CANISTER SUCTION 2500CC (MISCELLANEOUS) ×2 IMPLANT
CLOTH BEACON ORANGE TIMEOUT ST (SAFETY) ×2 IMPLANT
CONT SPEC 4OZ CLIKSEAL STRL BL (MISCELLANEOUS) ×2 IMPLANT
COVER MAYO STAND STRL (DRAPES) ×2 IMPLANT
DRAPE LAPAROTOMY 100X72 PEDS (DRAPES) ×2 IMPLANT
DRAPE MICROSCOPE LEICA (MISCELLANEOUS) IMPLANT
DRAPE POUCH INSTRU U-SHP 10X18 (DRAPES) ×2 IMPLANT
DRAPE SURG 17X23 STRL (DRAPES) ×4 IMPLANT
ELECT REM PT RETURN 9FT ADLT (ELECTROSURGICAL) ×2
ELECTRODE REM PT RTRN 9FT ADLT (ELECTROSURGICAL) ×1 IMPLANT
GAUZE SPONGE 4X4 16PLY XRAY LF (GAUZE/BANDAGES/DRESSINGS) ×2 IMPLANT
GLOVE BIO SURGEON STRL SZ8 (GLOVE) ×2 IMPLANT
GLOVE BIO SURGEON STRL SZ8.5 (GLOVE) ×2 IMPLANT
GLOVE BIOGEL PI IND STRL 8 (GLOVE) ×2 IMPLANT
GLOVE BIOGEL PI IND STRL 8.5 (GLOVE) ×1 IMPLANT
GLOVE BIOGEL PI INDICATOR 8 (GLOVE) ×2
GLOVE BIOGEL PI INDICATOR 8.5 (GLOVE) ×1
GLOVE EXAM NITRILE LRG STRL (GLOVE) IMPLANT
GLOVE EXAM NITRILE MD LF STRL (GLOVE) ×2 IMPLANT
GLOVE EXAM NITRILE XL STR (GLOVE) IMPLANT
GLOVE EXAM NITRILE XS STR PU (GLOVE) IMPLANT
GLOVE INDICATOR 8.5 STRL (GLOVE) ×2 IMPLANT
GLOVE SS BIOGEL STRL SZ 8 (GLOVE) ×1 IMPLANT
GLOVE SUPERSENSE BIOGEL SZ 8 (GLOVE) ×1
GOWN BRE IMP SLV AUR LG STRL (GOWN DISPOSABLE) IMPLANT
GOWN BRE IMP SLV AUR XL STRL (GOWN DISPOSABLE) ×2 IMPLANT
GOWN STRL REIN 2XL LVL4 (GOWN DISPOSABLE) ×2 IMPLANT
KIT BASIN OR (CUSTOM PROCEDURE TRAY) ×2 IMPLANT
KIT ROOM TURNOVER OR (KITS) ×2 IMPLANT
MARKER SKIN DUAL TIP RULER LAB (MISCELLANEOUS) ×2 IMPLANT
NEEDLE HYPO 22GX1.5 SAFETY (NEEDLE) ×2 IMPLANT
NEEDLE SPNL 18GX3.5 QUINCKE PK (NEEDLE) ×2 IMPLANT
NS IRRIG 1000ML POUR BTL (IV SOLUTION) ×2 IMPLANT
PACK LAMINECTOMY NEURO (CUSTOM PROCEDURE TRAY) ×2 IMPLANT
PIN DISTRACTION 14MM (PIN) ×4 IMPLANT
PLATE ANT CERV XTEND 2 LV 28 (Plate) ×2 IMPLANT
PUTTY 5ML ACTIFUSE ABX (Putty) ×2 IMPLANT
RUBBERBAND STERILE (MISCELLANEOUS) IMPLANT
SCREW XTD VAR 4.2 SELF TAP 12 (Screw) ×12 IMPLANT
SPONGE GAUZE 4X4 12PLY (GAUZE/BANDAGES/DRESSINGS) ×2 IMPLANT
SPONGE INTESTINAL PEANUT (DISPOSABLE) ×4 IMPLANT
SPONGE SURGIFOAM ABS GEL SZ50 (HEMOSTASIS) ×2 IMPLANT
STRIP CLOSURE SKIN 1/2X4 (GAUZE/BANDAGES/DRESSINGS) ×2 IMPLANT
SUT VIC AB 0 CT1 27 (SUTURE) ×1
SUT VIC AB 0 CT1 27XBRD ANTBC (SUTURE) ×1 IMPLANT
SUT VIC AB 3-0 SH 8-18 (SUTURE) ×2 IMPLANT
SYR 20ML ECCENTRIC (SYRINGE) ×2 IMPLANT
TOWEL OR 17X24 6PK STRL BLUE (TOWEL DISPOSABLE) ×2 IMPLANT
TOWEL OR 17X26 10 PK STRL BLUE (TOWEL DISPOSABLE) ×2 IMPLANT
VISTA S 14X14X7 (Spacer) ×4 IMPLANT
WATER STERILE IRR 1000ML POUR (IV SOLUTION) ×2 IMPLANT

## 2012-03-15 NOTE — Op Note (Signed)
Brief history: The patient is a 71 year old white female who is complaining of neck and left shoulder and arm pain. She has failed medical management was worked up with a cervical MRI. This demonstrated patient had a disc herniation at C4-5 and spondylosis at C5-6 on the left. I discussed the various treatment options with the patient including surgery. She has weighed the risks, benefits and alternatives surgery decided proceed with a C4-5 and C5-6 anterior cervicectomy fusion and plating.  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 Delma Officer  Asst.: Dr. Jillyn Hidden cram  Anesthesia: Gen. endotracheal  Estimated blood loss: 100 cc  Drains: None  Complications: None  Description of procedure: The patient was brought to the operating room by the anesthesia team. General endotracheal anesthesia was induced. A roll was placed under the patient's shoulders to keep the neck in the neutral position. The patient's anterior cervical region was then prepared with Betadine scrub and Betadine solution. Sterile drapes were applied.  The area to be incised was then injected with Marcaine with epinephrine solution. I then used a scalpel to make a transverse incision in the patient's left anterior neck. I used the Metzenbaum scissors to divide the platysmal muscle and then to dissect medial to the sternocleidomastoid muscle, jugular vein, and carotid artery. I carefully dissected down towards the anterior cervical spine identifying the esophagus and retracting  it medially. Then using Kitner swabs to clear soft tissue from the anterior cervical spine. We then inserted a bent spinal needle into the upper exposed intervertebral disc space. We then obtained intraoperative radiographs confirm our location.  I then used electrocautery to detach the medial border of the longus colli muscle bilaterally from the C4-5 and C5-6 intervertebral disc spaces. I then inserted the Caspar self-retaining retractor underneath the longus colli muscle bilaterally to provide exposure.  We then incised the intervertebral disc at C4-5. We then performed a partial intervertebral discectomy with a pituitary forceps and the Karlin curettes. I then inserted distraction screws into the vertebral bodies at C4 and C5. We then distracted the interspace. We then used the high-speed drill to decorticate the vertebral endplates at C4-5, to drill away the remainder of the intervertebral disc, to drill away some posterior spondylosis, and to thin out the posterior longitudinal ligament. I then incised ligament with the arachnoid knife. We then removed the ligament with a Kerrison punches undercutting the vertebral endplates and decompressing the thecal sac. We then performed foraminotomies about the bilateral C5 nerve roots. We did encounter a herniated disc on the left. Remove it with a pituitary forceps and the Kerrison punches. This completed the decompression at this level.  We then repeated this procedure in an analogous fashion at C5-6 decompressing the thecal sac and the bile C6 nerve roots. At this level we found spondylosis.  We now turned our to attention to the interbody fusion. We used the trial spacers to determine the appropriate size for the interbody prosthesis. We then pre-filled prosthesis with a combination of local morcellized autograft bone that we obtained during decompression as well as Actifuse bone graft extender. We then inserted the prosthesis into the distracted interspace  at C4-5 and C5-6.  We then removed the distraction screws. There was a good snug fit of the prosthesis in the interspace.   Having completed the fusion we now turned attention to the anterior spinal instrumentation. We used the high-speed drill to drill away some anterior spondylosis at the disc spaces so that the plate lay down flat. We selected the appropriate length titanium anterior cervical plate. We laid it along the anterior aspect of the vertebral bodies from C4-C6. We then drilled 12 mm holes at C4, C5 and C6. We then secured the plate to the vertebral bodies by placing two 12 mm self-tapping screws at C4, C5 and C6. We then obtained intraoperative radiograph. The demonstrating good position of the instrumentation. We therefore secured the screws the plate the locking each cam. This completed the instrumentation.  We then obtained hemostasis using bipolar electrocautery. We irrigated the wound out with bacitracin solution. We then removed the retractor. We inspected the esophagus for any damage. There was none apparent. We then reapproximated patient's platysmal muscle with interrupted 3-0 Vicryl suture. We then reapproximated the subcutaneous tissue with interrupted 3-0 Vicryl suture. The skin was reapproximated with Steri-Strips and benzoin. The wound was then covered with bacitracin ointment. A sterile dressing was applied. The drapes were removed. Patient was subsequently extubated by the anesthesia team and transported to the post anesthesia care unit in stable condition. All sponge instrument and needle counts were correct at the end of this case.

## 2012-03-15 NOTE — Preoperative (Signed)
Beta Blockers   Reason not to administer Beta Blockers:Not Applicable 

## 2012-03-15 NOTE — Progress Notes (Signed)
Patient cbg is 106 on arrival Theresa Kramer

## 2012-03-15 NOTE — Anesthesia Preprocedure Evaluation (Addendum)
Anesthesia Evaluation  Patient identified by MRN, date of birth, ID band Patient awake    Reviewed: Allergy & Precautions, H&P , NPO status , Patient's Chart, lab work & pertinent test results  History of Anesthesia Complications (+) PONV  Airway Mallampati: II TM Distance: >3 FB Neck ROM: Limited    Dental   Pulmonary neg pulmonary ROS,          Cardiovascular hypertension, Pt. on medications     Neuro/Psych Depression    GI/Hepatic Neg liver ROS, hiatal hernia, GERD-  Controlled and Medicated,  Endo/Other  Diabetes mellitus-, Insulin Dependent and Oral Hypoglycemic Agents  Renal/GU negative Renal ROS  negative genitourinary   Musculoskeletal   Abdominal   Peds  Hematology negative hematology ROS (+)   Anesthesia Other Findings   Reproductive/Obstetrics                           Anesthesia Physical Anesthesia Plan  ASA: II  Anesthesia Plan: General   Post-op Pain Management:    Induction: Intravenous  Airway Management Planned: Oral ETT  Additional Equipment:   Intra-op Plan:   Post-operative Plan: Extubation in OR  Informed Consent: I have reviewed the patients History and Physical, chart, labs and discussed the procedure including the risks, benefits and alternatives for the proposed anesthesia with the patient or authorized representative who has indicated his/her understanding and acceptance.     Plan Discussed with: CRNA and Surgeon  Anesthesia Plan Comments:         Anesthesia Quick Evaluation

## 2012-03-15 NOTE — Progress Notes (Signed)
Subjective:  The patient is alert and pleasant. She looks well. She has no complaints.  Objective: Vital signs in last 24 hours: Temp:  [97 F (36.1 C)-98 F (36.7 C)] 97 F (36.1 C) (05/13 1315) Pulse Rate:  [57] 57  (05/13 0732) Resp:  [18] 18  (05/13 0732) BP: (104)/(64) 104/64 mmHg (05/13 0732) SpO2:  [97 %] 97 % (05/13 0732)  Intake/Output from previous day:   Intake/Output this shift: Total I/O In: 1800 [I.V.:1800] Out: 150 [Blood:150]  Physical exam the patient is alert and pleasant. She is moving all 4 extremities well. Her deltoid strength is normal bilaterally. Her dressing is clean and dry without evidence of hematoma or shift.  Lab Results: No results found for this basename: WBC:2,HGB:2,HCT:2,PLT:2 in the last 72 hours BMET No results found for this basename: NA:2,K:2,CL:2,CO2:2,GLUCOSE:2,BUN:2,CREATININE:2,CALCIUM:2 in the last 72 hours  Studies/Results: No results found.  Assessment/Plan: The patient is doing well.  LOS: 0 days     Theresa Kramer D 03/15/2012, 1:30 PM

## 2012-03-15 NOTE — H&P (Signed)
Subjective: The patient is a 71 year old white female who complains of neck pain with pain into her left shoulder and down her left arm. She has failed medical management and was worked up with a cervical MRI. This demonstrates spondylosis and stenosis at C4-5 C5-6. I discussed the various treatment options with the patient including surgery. She has weighed the risks, benefits, and alternatives surgery decided proceed with a C4-5 and C5-6 anterior cervicectomy, fusion, and plating.  Past Medical History  Diagnosis Date  . Diabetes mellitus   . Hypertension   . Complication of anesthesia   . PONV (postoperative nausea and vomiting)   . Hyperlipidemia   . Bronchitis     hx of  . Urinary tract infection     hx of  . GERD (gastroesophageal reflux disease)   . H/O hiatal hernia   . Arthritis   . Depression     "not taking medication"  . Pinched cervical nerve root   . Noncompliance with medication regimen     Past Surgical History  Procedure Date  . Appendectomy   . Tonsillectomy   . Orthopedic surgery   . Rotator cuff repair     "multiple in both shoulders" and total shoulder replacement in left arm  . Knee surgery     torn cartilage repair  . Hip arthroplasty     right  . Eye surgery     bilateral cataract surgery,   . Dilation and curettage of uterus   . Abdominal hysterectomy   . Bone cyst excision     from lower back and foot  . Joint replacement   . Cholecystectomy     Allergies  Allergen Reactions  . Meloxicam     History  Substance Use Topics  . Smoking status: Former Games developer  . Smokeless tobacco: Not on file   Comment: stopped 25 years ago  . Alcohol Use: No    No family history on file. Prior to Admission medications   Medication Sig Start Date End Date Taking? Authorizing Provider  aspirin EC 81 MG tablet Take 81 mg by mouth daily.     Yes Historical Provider, MD  benzonatate (TESSALON) 200 MG capsule Take 200 mg by mouth 3 (three) times daily as  needed. For cough    Yes Historical Provider, MD  cefdinir (OMNICEF) 300 MG capsule Take 300 mg by mouth 2 (two) times daily.   Yes Historical Provider, MD  citalopram (CELEXA) 20 MG tablet Take 20 mg by mouth daily.     Yes Historical Provider, MD  cyanocobalamin (,VITAMIN B-12,) 1000 MCG/ML injection Inject 1,000 mcg into the muscle every 30 (thirty) days. Usually on the 1st    Yes Historical Provider, MD  esomeprazole (NEXIUM) 40 MG capsule Take 40 mg by mouth daily before breakfast.     Yes Historical Provider, MD  gabapentin (NEURONTIN) 300 MG capsule Take 300 mg by mouth daily.    Yes Historical Provider, MD  gemfibrozil (LOPID) 600 MG tablet Take 600 mg by mouth 2 (two) times daily before a meal.     Yes Historical Provider, MD  glipiZIDE (GLUCOTROL XL) 5 MG 24 hr tablet Take 5 mg by mouth daily.     Yes Historical Provider, MD  insulin glargine (LANTUS) 100 UNIT/ML injection Inject 60 Units into the skin at bedtime.     Yes Historical Provider, MD  lisinopril (PRINIVIL,ZESTRIL) 40 MG tablet Take 40 mg by mouth daily.     Yes Historical Provider, MD  metFORMIN (  GLUCOPHAGE) 1000 MG tablet Take 1,000 mg by mouth 2 (two) times daily with a meal.     Yes Historical Provider, MD  oxybutynin (DITROPAN-XL) 10 MG 24 hr tablet Take 10 mg by mouth 2 (two) times daily.     Yes Historical Provider, MD  oxyCODONE-acetaminophen (PERCOCET) 5-325 MG per tablet Take 1 tablet by mouth every 4 (four) hours as needed. For pain    Yes Historical Provider, MD  pramipexole (MIRAPEX) 0.25 MG tablet Take 0.25 mg by mouth daily.     Yes Historical Provider, MD  pravastatin (PRAVACHOL) 40 MG tablet Take 40 mg by mouth daily.     Yes Historical Provider, MD  traMADol (ULTRAM) 50 MG tablet Take 50 mg by mouth every 6 (six) hours as needed. For pain    Yes Historical Provider, MD  chlorthalidone (HYGROTON) 25 MG tablet Take 25 mg by mouth daily.      Historical Provider, MD  clotrimazole (LOTRIMIN) 1 % cream Apply 1  application topically 2 (two) times daily as needed. For rash     Historical Provider, MD  insulin aspart (NOVOLOG FLEXPEN) 100 UNIT/ML injection Inject 20 Units into the skin 3 (three) times daily before meals.     Historical Provider, MD  meclizine (ANTIVERT) 25 MG tablet Take 25 mg by mouth 3 (three) times daily as needed. For dizziness     Historical Provider, MD  methocarbamol (ROBAXIN) 500 MG tablet Take 500 mg by mouth every 6 (six) hours as needed. For pain     Historical Provider, MD  promethazine (PHENERGAN) 25 MG tablet Take 25 mg by mouth every 6 (six) hours as needed. For nausea     Historical Provider, MD     Review of Systems  Positive ROS: As above  All other systems have been reviewed and were otherwise negative with the exception of those mentioned in the HPI and as above.  Objective: Vital signs in last 24 hours: Temp:  [98 F (36.7 C)] 98 F (36.7 C) (05/13 0732) Pulse Rate:  [57] 57  (05/13 0732) Resp:  [18] 18  (05/13 0732) BP: (104)/(64) 104/64 mmHg (05/13 0732) SpO2:  [97 %] 97 % (05/13 0732)  General Appearance: Alert, cooperative, no distress, appears stated age Head: Normocephalic, without obvious abnormality, atraumatic Eyes: PERRL, conjunctiva/corneas clear, EOM's intact, fundi benign, both eyes      Ears: Normal TM's and external ear canals, both ears Throat: Lips, mucosa, and tongue normal; teeth and gums normal Neck: Supple, symmetrical, trachea midline, no adenopathy; thyroid: No enlargement/tenderness/nodules; no carotid bruit or JVD Back: Symmetric, no curvature, ROM normal, no CVA tenderness Lungs: Clear to auscultation bilaterally, respirations unlabored Heart: Regular rate and rhythm, S1 and S2 normal, no murmur, rub or gallop Abdomen: Soft, non-tender, bowel sounds active all four quadrants, no masses, no organomegaly Extremities: Extremities normal, atraumatic, no cyanosis or edema Pulses: 2+ and symmetric all extremities Skin: Skin color,  texture, turgor normal, no rashes or lesions  NEUROLOGIC:   Mental status: alert and oriented, no aphasia, good attention span, Fund of knowledge/ memory ok Motor Exam - grossly normal except she has weakness in her left deltoid at 4/5. Sensory Exam - grossly normal Reflexes: Symmetric Coordination - grossly normal Gait - grossly normal Balance - grossly normal Cranial Nerves: I: smell Not tested  II: visual acuity  OS: Normal    OD: Normal   II: visual fields Full to confrontation  II: pupils Equal, round, reactive to light  III,VII: ptosis  None  III,IV,VI: extraocular muscles  Full ROM  V: mastication Normal  V: facial light touch sensation  Normal  V,VII: corneal reflex  Present  VII: facial muscle function - upper  Normal  VII: facial muscle function - lower Normal  VIII: hearing Not tested  IX: soft palate elevation  Normal  IX,X: gag reflex Present  XI: trapezius strength  5/5  XI: sternocleidomastoid strength 5/5  XI: neck flexion strength  5/5  XII: tongue strength  Normal    Data Review Lab Results  Component Value Date   WBC 6.7 03/11/2012   HGB 13.2 03/11/2012   HCT 39.8 03/11/2012   MCV 87.3 03/11/2012   PLT 271 03/11/2012   Lab Results  Component Value Date   NA 136 03/11/2012   K 4.7 03/11/2012   CL 97 03/11/2012   CO2 25 03/11/2012   BUN 15 03/11/2012   CREATININE 0.66 03/11/2012   GLUCOSE 200* 03/11/2012   Lab Results  Component Value Date   INR 1.01 04/10/2010    Assessment/Plan: 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 I reviewed her MR scan with her and pointed out the abnormalities. We've discussed the various treatment options including surgery. I described the surgical option the C4-5 and C5-6 anterior cervical discectomy, fusion, and plating. I've shown her surgical models. We've discussed the risks, benefits, alternatives and likelihood of achieving our goals  with surgery. I have answered all the patient's questions. She wants to proceed with surgery.   Theresa Kramer 03/15/2012 10:30 AM

## 2012-03-15 NOTE — Anesthesia Postprocedure Evaluation (Signed)
Anesthesia Post Note  Patient: Theresa Kramer  Procedure(s) Performed: Procedure(s) (LRB): ANTERIOR CERVICAL DECOMPRESSION/DISCECTOMY FUSION 2 LEVELS (N/A)  Anesthesia type: general  Patient location: PACU  Post pain: Pain level controlled  Post assessment: Patient's Cardiovascular Status Stable  Last Vitals:  Filed Vitals:   03/15/12 1350  BP: 137/41  Pulse: 82  Temp:   Resp: 29    Post vital signs: Reviewed and stable  Level of consciousness: sedated  Complications: No apparent anesthesia complications

## 2012-03-15 NOTE — Anesthesia Procedure Notes (Addendum)
Procedure Name: Intubation Date/Time: 03/15/2012 10:44 AM Performed by: Quentin Ore Pre-anesthesia Checklist: Patient identified, Emergency Drugs available, Suction available, Patient being monitored and Timeout performed Patient Re-evaluated:Patient Re-evaluated prior to inductionOxygen Delivery Method: Circle system utilized Preoxygenation: Pre-oxygenation with 100% oxygen Intubation Type: IV induction Ventilation: Mask ventilation without difficulty and Oral airway inserted - appropriate to patient size Laryngoscope Size: Mac and 3 Grade View: Grade I Tube type: Oral Tube size: 7.5 mm Number of attempts: 1 Airway Equipment and Method: Stylet and LTA kit utilized Placement Confirmation: ETT inserted through vocal cords under direct vision,  positive ETCO2 and breath sounds checked- equal and bilateral Secured at: 21 cm Tube secured with: Tape Dental Injury: Teeth and Oropharynx as per pre-operative assessment

## 2012-03-15 NOTE — Transfer of Care (Signed)
Immediate Anesthesia Transfer of Care Note  Patient: Theresa Kramer  Procedure(s) Performed: Procedure(s) (LRB): ANTERIOR CERVICAL DECOMPRESSION/DISCECTOMY FUSION 2 LEVELS (N/A)  Patient Location: PACU  Anesthesia Type: General  Level of Consciousness: awake, alert  and oriented  Airway & Oxygen Therapy: Patient Spontanous Breathing and Patient connected to nasal cannula oxygen  Post-op Assessment: Report given to PACU RN, Post -op Vital signs reviewed and stable and Patient moving all extremities X 4  Post vital signs: Reviewed and stable  Complications: No apparent anesthesia complications

## 2012-03-16 ENCOUNTER — Encounter (HOSPITAL_COMMUNITY): Payer: Self-pay | Admitting: Neurosurgery

## 2012-03-16 LAB — GLUCOSE, CAPILLARY
Glucose-Capillary: 55 mg/dL — ABNORMAL LOW (ref 70–99)
Glucose-Capillary: 72 mg/dL (ref 70–99)
Glucose-Capillary: 168 mg/dL — ABNORMAL HIGH (ref 70–99)

## 2012-03-16 MED ORDER — OXYCODONE-ACETAMINOPHEN 5-325 MG PO TABS: 1.0000 | ORAL_TABLET | ORAL | Status: AC | PRN

## 2012-03-16 MED ORDER — DSS 100 MG PO CAPS: 100.0000 mg | ORAL_CAPSULE | Freq: Two times a day (BID) | ORAL | Status: AC

## 2012-03-16 MED ORDER — DIAZEPAM 5 MG PO TABS: 5.0000 mg | ORAL_TABLET | Freq: Four times a day (QID) | ORAL | Status: AC | PRN

## 2012-03-16 NOTE — Progress Notes (Signed)
Pt.'s CBG=55. 15 gms of carbs given per protocol. And rechecked again at 1240 with CBG  72. Insulin med not given at 1200. Md will be notified and Rn will continue to monitor.

## 2012-03-16 NOTE — Progress Notes (Signed)
UR COMPLETED  

## 2012-03-16 NOTE — Progress Notes (Signed)
Patient ID: Theresa Kramer, female   DOB: [birth year], 71 y.o.   MRN: [id number] Subjective:  The patient is alert and pleasant. She looks well. She wants to stay until tomorrow.  Objective: Vital signs in last 24 hours: Temp:  [97 F (36.1 C)-98.2 F (36.8 C)] 97.4 F (36.3 C) (05/14 0400) Pulse Rate:  [57-102] 79  (05/14 0400) Resp:  [14-33] 14  (05/14 0400) BP: (91-159)/(41-75) 105/62 mmHg (05/14 0400) SpO2:  [93 %-98 %] 93 % (05/14 0400)  Intake/Output from previous day: 05/13 0701 - 05/14 0700 In: 2040 [P.O.:240; I.V.:1800] Out: 150 [Blood:150] Intake/Output this shift:    Physical exam the patient is alert and oriented x3. Her strength is normal in all 4 extremities including her bilateral deltoids. Her dressing is clean and dry without evidence of hematoma or shift.  Lab Results: No results found for this basename: WBC:2,HGB:2,HCT:2,PLT:2 in the last 72 hours BMET No results found for this basename: NA:2,K:2,CL:2,CO2:2,GLUCOSE:2,BUN:2,CREATININE:2,CALCIUM:2 in the last 72 hours  Studies/Results: Dg Cervical Spine 2-3 Views  03/15/2012  *RADIOLOGY REPORT*  Clinical Data: ACDF C4-C6  CERVICAL SPINE - 2-3 VIEW  Comparison: Cervical MRI 12/05/2011  Findings: Initial image reveals needle localization of the C4-5 disc space.  Disc degeneration and spondylosis at C4-5 and C5-6.  Image #2 reveals anterior plate and interbody bone graft at C4-5 and C5-6 in satisfactory position.  IMPRESSION: Satisfactory ACDF C4-C6.  Original Report Authenticated By: Camelia Phenes, M.D.    Assessment/Plan: Postop day 1: The patient is doing well. We will tentatively plan to send her home tomorrow per her request.  LOS: 1 day     Mayah Urquidi D 03/16/2012, 7:28 AM

## 2012-03-16 NOTE — Progress Notes (Signed)
CBG WAS RECHECKED AT 1410 WITH THE RESULT OF 168.  Pt. Alert and oriented,follows simple instructions, denies pain. Incision area without swelling, redness or S/S of infection. Voiding adequate clear yellow urine. Moving all extremities well and vitals stable and documented. Cervical fusion surgery notes instructions given to patient and family member for home safety and precautions.Pt and family stated understanding of instructions given.

## 2012-03-16 NOTE — Care Management Note (Signed)
    Page 1 of 1   03/16/2012     11:24:58 AM   CARE MANAGEMENT NOTE 03/16/2012  Patient:  Theresa Kramer, Theresa Kramer   Account Number:  [id number]  Date Initiated:  03/16/2012  Documentation initiated by:  Anette Guarneri  Subjective/Objective Assessment:   POD#1 ACDF  moves independently, no DME or [facility] service needs     Action/Plan:   follow for any potential needs   Anticipated DC Date:  03/16/2012   Anticipated DC Plan:  HOME/SELF CARE      DC Planning Services  CM consult      Choice offered to / List presented to:             Status of service:  In process, will continue to follow Medicare Important Message given?  NO (If response is "NO", the following Medicare IM given date fields will be blank) Date Medicare IM given:   Date Additional Medicare IM given:    Discharge Disposition:    Per UR Regulation:  Reviewed for med. necessity/level of care/duration of stay  If discussed at Long Length of Stay Meetings, dates discussed:    Comments:

## 2012-03-26 NOTE — Discharge Summary (Signed)
Physician Discharge Summary  Patient ID: Theresa Kramer MRN: [id number] DOB/AGE: [birth year] 72 y.o.  Admit date: 03/15/2012 Discharge date: 03/17/12  Admission Diagnoses:C4/5 Spondylosis, stenosis, herniated disc, radiculopathy  Discharge Diagnoses: same Principal Problem:  *Cervical herniated disc   Discharged Condition: good  Hospital Course: unremarkable   Consults:none Significant Diagnostic Studies:none Treatments:C4/5 and C5/6 anterior discectomy, fusion, plating, interbody prosthesis Discharge Exam: Blood pressure 102/58, pulse 71, temperature 97.4 F (36.3 C), temperature source Oral, resp. rate 16, SpO2 95.00%. Normal strength. No hematoma  Disposition: home   Medication List  As of 03/26/2012 12:53 PM   STOP taking these medications         methocarbamol 500 MG tablet      traMADol 50 MG tablet         TAKE these medications         aspirin EC 81 MG tablet   Take 81 mg by mouth daily.      benzonatate 200 MG capsule   Commonly known as: TESSALON   Take 200 mg by mouth 3 (three) times daily as needed. For cough      cefdinir 300 MG capsule   Commonly known as: OMNICEF   Take 300 mg by mouth 2 (two) times daily.      chlorthalidone 25 MG tablet   Commonly known as: HYGROTON   Take 25 mg by mouth daily.      citalopram 20 MG tablet   Commonly known as: CELEXA   Take 20 mg by mouth daily.      clotrimazole 1 % cream   Commonly known as: LOTRIMIN   Apply 1 application topically 2 (two) times daily as needed. For rash      cyanocobalamin 1000 MCG/ML injection   Commonly known as: (VITAMIN B-12)   Inject 1,000 mcg into the muscle every 30 (thirty) days. Usually on the 1st      diazepam 5 MG tablet   Commonly known as: VALIUM   Take 1 tablet (5 mg total) by mouth every 6 (six) hours as needed.      DSS 100 MG Caps   Take 100 mg by mouth 2 (two) times daily.      esomeprazole 40 MG capsule   Commonly known as: NEXIUM   Take 40 mg by mouth  daily before breakfast.      gabapentin 300 MG capsule   Commonly known as: NEURONTIN   Take 300 mg by mouth daily.      gemfibrozil 600 MG tablet   Commonly known as: LOPID   Take 600 mg by mouth 2 (two) times daily before a meal.      glipiZIDE 5 MG 24 hr tablet   Commonly known as: GLUCOTROL XL   Take 5 mg by mouth daily.      insulin glargine 100 UNIT/ML injection   Commonly known as: LANTUS   Inject 60 Units into the skin at bedtime.      lisinopril 40 MG tablet   Commonly known as: PRINIVIL,ZESTRIL   Take 40 mg by mouth daily.      meclizine 25 MG tablet   Commonly known as: ANTIVERT   Take 25 mg by mouth 3 (three) times daily as needed. For dizziness      metFORMIN 1000 MG tablet   Commonly known as: GLUCOPHAGE   Take 1,000 mg by mouth 2 (two) times daily with a meal.      NOVOLOG FLEXPEN 100 UNIT/ML injection   Generic drug:  insulin aspart   Inject 20 Units into the skin 3 (three) times daily before meals.      oxybutynin 10 MG 24 hr tablet   Commonly known as: DITROPAN-XL   Take 10 mg by mouth 2 (two) times daily.      oxyCODONE-acetaminophen 5-325 MG per tablet   Commonly known as: PERCOCET   Take 1 tablet by mouth every 4 (four) hours as needed. For pain      oxyCODONE-acetaminophen 5-325 MG per tablet   Commonly known as: PERCOCET   Take 1-2 tablets by mouth every 4 (four) hours as needed.      pramipexole 0.25 MG tablet   Commonly known as: MIRAPEX   Take 0.25 mg by mouth daily.      pravastatin 40 MG tablet   Commonly known as: PRAVACHOL   Take 40 mg by mouth daily.      promethazine 25 MG tablet   Commonly known as: PHENERGAN   Take 25 mg by mouth every 6 (six) hours as needed. For nausea             Signed: Cristi Loron 03/26/2012, 12:53 PM

## 2012-09-13 ENCOUNTER — Other Ambulatory Visit (HOSPITAL_COMMUNITY): Payer: Self-pay | Admitting: Internal Medicine

## 2012-09-13 DIAGNOSIS — Z139 Encounter for screening, unspecified: Secondary | ICD-10-CM

## 2012-09-16 ENCOUNTER — Ambulatory Visit (HOSPITAL_COMMUNITY)
Admission: RE | Admit: 2012-09-16 | Discharge: 2012-09-16 | Disposition: A | Discharge status: Home / Self Care | Payer: Medicare Other | Source: Ambulatory Visit | Attending: Internal Medicine | Admitting: Internal Medicine

## 2012-09-16 DIAGNOSIS — Z139 Encounter for screening, unspecified: Secondary | ICD-10-CM

## 2012-09-16 DIAGNOSIS — Z1231 Encounter for screening mammogram for malignant neoplasm of breast: Secondary | ICD-10-CM | POA: Insufficient documentation

## 2013-08-15 ENCOUNTER — Other Ambulatory Visit (HOSPITAL_COMMUNITY): Payer: Self-pay | Admitting: Internal Medicine

## 2013-08-15 DIAGNOSIS — Z139 Encounter for screening, unspecified: Secondary | ICD-10-CM

## 2013-09-20 ENCOUNTER — Ambulatory Visit (HOSPITAL_COMMUNITY)
Admission: RE | Admit: 2013-09-20 | Discharge: 2013-09-20 | Disposition: A | Discharge status: Home / Self Care | Payer: Medicare Other | Source: Ambulatory Visit | Attending: Internal Medicine | Admitting: Internal Medicine

## 2013-09-20 DIAGNOSIS — Z1231 Encounter for screening mammogram for malignant neoplasm of breast: Secondary | ICD-10-CM | POA: Insufficient documentation

## 2013-09-20 DIAGNOSIS — Z139 Encounter for screening, unspecified: Secondary | ICD-10-CM

## 2014-05-01 ENCOUNTER — Other Ambulatory Visit (HOSPITAL_COMMUNITY): Payer: Self-pay | Admitting: Internal Medicine

## 2014-05-01 DIAGNOSIS — R110 Nausea: Secondary | ICD-10-CM

## 2014-05-04 ENCOUNTER — Ambulatory Visit (HOSPITAL_COMMUNITY)
Admission: RE | Admit: 2014-05-04 | Discharge: 2014-05-04 | Disposition: A | Discharge status: Home / Self Care | Payer: Medicare Other | Source: Ambulatory Visit | Attending: Internal Medicine | Admitting: Internal Medicine

## 2014-05-04 DIAGNOSIS — K224 Dyskinesia of esophagus: Secondary | ICD-10-CM | POA: Insufficient documentation

## 2014-05-04 DIAGNOSIS — Q393 Congenital stenosis and stricture of esophagus: Secondary | ICD-10-CM

## 2014-05-04 DIAGNOSIS — R110 Nausea: Secondary | ICD-10-CM

## 2014-05-04 DIAGNOSIS — Q391 Atresia of esophagus with tracheo-esophageal fistula: Secondary | ICD-10-CM | POA: Insufficient documentation

## 2014-05-04 DIAGNOSIS — K449 Diaphragmatic hernia without obstruction or gangrene: Secondary | ICD-10-CM | POA: Insufficient documentation

## 2014-08-07 ENCOUNTER — Other Ambulatory Visit: Payer: Self-pay | Admitting: Orthopedic Surgery

## 2014-08-07 DIAGNOSIS — M5441 Lumbago with sciatica, right side: Secondary | ICD-10-CM

## 2014-08-07 DIAGNOSIS — M5442 Lumbago with sciatica, left side: Principal | ICD-10-CM

## 2014-08-07 DIAGNOSIS — M5416 Radiculopathy, lumbar region: Secondary | ICD-10-CM

## 2014-08-11 ENCOUNTER — Ambulatory Visit
Admission: RE | Admit: 2014-08-11 | Discharge: 2014-08-11 | Disposition: A | Discharge status: Home / Self Care | Payer: Medicare Other | Source: Ambulatory Visit | Attending: Orthopedic Surgery | Admitting: Orthopedic Surgery

## 2014-08-11 DIAGNOSIS — M5442 Lumbago with sciatica, left side: Principal | ICD-10-CM

## 2014-08-11 DIAGNOSIS — M5416 Radiculopathy, lumbar region: Secondary | ICD-10-CM

## 2014-08-11 DIAGNOSIS — M5441 Lumbago with sciatica, right side: Secondary | ICD-10-CM

## 2014-09-18 ENCOUNTER — Other Ambulatory Visit: Payer: Self-pay

## 2014-09-18 ENCOUNTER — Telehealth: Payer: Self-pay

## 2014-09-18 ENCOUNTER — Other Ambulatory Visit (HOSPITAL_COMMUNITY): Payer: Self-pay | Admitting: Internal Medicine

## 2014-09-18 DIAGNOSIS — R1314 Dysphagia, pharyngoesophageal phase: Secondary | ICD-10-CM

## 2014-09-18 DIAGNOSIS — Z1231 Encounter for screening mammogram for malignant neoplasm of breast: Secondary | ICD-10-CM

## 2014-09-18 DIAGNOSIS — R1310 Dysphagia, unspecified: Secondary | ICD-10-CM | POA: Diagnosis not present

## 2014-09-18 DIAGNOSIS — Z87891 Personal history of nicotine dependence: Secondary | ICD-10-CM | POA: Diagnosis not present

## 2014-09-18 DIAGNOSIS — R070 Pain in throat: Secondary | ICD-10-CM

## 2014-09-18 DIAGNOSIS — K449 Diaphragmatic hernia without obstruction or gangrene: Secondary | ICD-10-CM | POA: Diagnosis not present

## 2014-09-18 DIAGNOSIS — I10 Essential (primary) hypertension: Secondary | ICD-10-CM | POA: Diagnosis not present

## 2014-09-18 DIAGNOSIS — F329 Major depressive disorder, single episode, unspecified: Secondary | ICD-10-CM | POA: Diagnosis not present

## 2014-09-18 DIAGNOSIS — E785 Hyperlipidemia, unspecified: Secondary | ICD-10-CM | POA: Diagnosis not present

## 2014-09-18 DIAGNOSIS — E119 Type 2 diabetes mellitus without complications: Secondary | ICD-10-CM | POA: Diagnosis not present

## 2014-09-18 DIAGNOSIS — K219 Gastro-esophageal reflux disease without esophagitis: Secondary | ICD-10-CM | POA: Diagnosis not present

## 2014-09-18 DIAGNOSIS — Z794 Long term (current) use of insulin: Secondary | ICD-10-CM | POA: Diagnosis not present

## 2014-09-18 MED ORDER — SODIUM CHLORIDE 0.9 % IV SOLN: INTRAVENOUS | Status: DC

## 2014-09-18 NOTE — Telephone Encounter (Signed)
I called and reviewed the instructions with pt's daughter, Leonette Monarch, and also faxed the info to her at [phone].

## 2014-09-18 NOTE — Telephone Encounter (Signed)
Clear liquid breakfast okay but complete by 6am. NPO after 6am.  Hold metformin morning of procedure. Hold glipizide morning of procedure.  Lantus 40 units at bedtime.  Continue Novolog 12 units before meals.

## 2014-09-18 NOTE — Telephone Encounter (Addendum)
Gastroenterology Pre-Procedure Review  Request Date: 09/18/2014 Requesting Physician: Dr. Willey Blade Dr. Gala Romney  PATIENT REVIEW QUESTIONS: The patient responded to the following health history questions as indicated:    Dr. Gala Romney requested this pt to be put on for EGD/ED  Triage info given by her daughter Leonette Monarch  She said pt's blood sugars running a little high due to cortizone injection last week for a pinced nerve ( running around 200) Was told it would do that for several days after the injection  1. Diabetes Melitis: YES 2. Joint replacements in the past 12 months: no 3. Major health problems in the past 3 months: no 4. Has an artificial valve or MVP: no 5. Has a defibrillator: no 6. Has been advised in past to take antibiotics in advance of a procedure like teeth cleaning: no    MEDICATIONS & ALLERGIES:    Patient reports the following regarding taking any blood thinners:   Plavix? no Aspirin? no Coumadin? no  Patient confirms/reports the following medications:  Current Outpatient Prescriptions  Medication Sig Dispense Refill  . citalopram (CELEXA) 20 MG tablet Take 20 mg by mouth daily.      . clotrimazole (LOTRIMIN) 1 % cream Apply 1 application topically 2 (two) times daily as needed. For rash     . esomeprazole (NEXIUM) 40 MG capsule Take 40 mg by mouth daily before breakfast.      . gemfibrozil (LOPID) 600 MG tablet Take 600 mg by mouth 2 (two) times daily before a meal.      . glipiZIDE (GLUCOTROL XL) 5 MG 24 hr tablet Take 5 mg by mouth daily.      . insulin aspart (NOVOLOG FLEXPEN) 100 UNIT/ML injection Inject 12 Units into the skin 3 (three) times daily before meals.     . insulin glargine (LANTUS) 100 UNIT/ML injection Inject 65 Units into the skin at bedtime.     Marland Kitchen lisinopril (PRINIVIL,ZESTRIL) 40 MG tablet Take 40 mg by mouth daily.      . meclizine (ANTIVERT) 25 MG tablet Take 25 mg by mouth 3 (three) times daily as needed. For dizziness     . metFORMIN  (GLUCOPHAGE) 1000 MG tablet Take 1,000 mg by mouth 2 (two) times daily with a meal.      . oxybutynin (DITROPAN-XL) 10 MG 24 hr tablet Take 10 mg by mouth 2 (two) times daily.      . pramipexole (MIRAPEX) 0.25 MG tablet Take 0.25 mg by mouth daily.      . pravastatin (PRAVACHOL) 40 MG tablet Take 40 mg by mouth daily.      . promethazine (PHENERGAN) 25 MG tablet Take 25 mg by mouth every 6 (six) hours as needed. For nausea     . ranitidine (ZANTAC) 150 MG tablet Take 150 mg by mouth at bedtime.    . cyanocobalamin (,VITAMIN B-12,) 1000 MCG/ML injection Inject 1,000 mcg into the muscle every 30 (thirty) days. Usually on the 1st      No current facility-administered medications for this visit.    Patient confirms/reports the following allergies:  Allergies  Allergen Reactions  . Meloxicam     No orders of the defined types were placed in this encounter.    AUTHORIZATION INFORMATION Primary Insurance:   ID #:   Group #:  Pre-Cert / Auth required:  Pre-Cert / Auth #:   Secondary Insurance:   ID #:  Group #:  Pre-Cert / Auth required: Pre-Cert / Auth #:   SCHEDULE INFORMATION:  Procedure has been scheduled as follows:  Date: 09/19/2014                        Time:  1:45 AM Location: [facility] Short Stay  This Gastroenterology Pre-Precedure Review Form is being routed to the following provider(s): R. Garfield Cornea, MD

## 2014-09-18 NOTE — Telephone Encounter (Signed)
I called [employer] at [phone] and spoke to Cristie Hem C who said that a PA is not required for an EGD as out patient at the hospital.

## 2014-09-19 ENCOUNTER — Encounter (HOSPITAL_COMMUNITY)
Admission: RE | Disposition: A | Discharge status: Home / Self Care | Payer: Self-pay | Source: Ambulatory Visit | Attending: Internal Medicine

## 2014-09-19 ENCOUNTER — Encounter (HOSPITAL_COMMUNITY): Payer: Self-pay | Admitting: *Deleted

## 2014-09-19 ENCOUNTER — Ambulatory Visit (HOSPITAL_COMMUNITY)
Admission: RE | Admit: 2014-09-19 | Discharge: 2014-09-19 | Disposition: A | Discharge status: Home / Self Care | Payer: Medicare Other | Source: Ambulatory Visit | Attending: Internal Medicine | Admitting: Internal Medicine

## 2014-09-19 DIAGNOSIS — I10 Essential (primary) hypertension: Secondary | ICD-10-CM | POA: Insufficient documentation

## 2014-09-19 DIAGNOSIS — R070 Pain in throat: Secondary | ICD-10-CM

## 2014-09-19 DIAGNOSIS — Q394 Esophageal web: Secondary | ICD-10-CM

## 2014-09-19 DIAGNOSIS — Z87891 Personal history of nicotine dependence: Secondary | ICD-10-CM | POA: Insufficient documentation

## 2014-09-19 DIAGNOSIS — E785 Hyperlipidemia, unspecified: Secondary | ICD-10-CM | POA: Insufficient documentation

## 2014-09-19 DIAGNOSIS — R1310 Dysphagia, unspecified: Secondary | ICD-10-CM | POA: Diagnosis not present

## 2014-09-19 DIAGNOSIS — Z794 Long term (current) use of insulin: Secondary | ICD-10-CM | POA: Insufficient documentation

## 2014-09-19 DIAGNOSIS — F329 Major depressive disorder, single episode, unspecified: Secondary | ICD-10-CM | POA: Insufficient documentation

## 2014-09-19 DIAGNOSIS — K449 Diaphragmatic hernia without obstruction or gangrene: Secondary | ICD-10-CM | POA: Insufficient documentation

## 2014-09-19 DIAGNOSIS — E119 Type 2 diabetes mellitus without complications: Secondary | ICD-10-CM | POA: Insufficient documentation

## 2014-09-19 DIAGNOSIS — K222 Esophageal obstruction: Secondary | ICD-10-CM | POA: Insufficient documentation

## 2014-09-19 DIAGNOSIS — K219 Gastro-esophageal reflux disease without esophagitis: Secondary | ICD-10-CM | POA: Insufficient documentation

## 2014-09-19 DIAGNOSIS — R1314 Dysphagia, pharyngoesophageal phase: Secondary | ICD-10-CM

## 2014-09-19 HISTORY — PX: MALONEY DILATION: SHX5535

## 2014-09-19 HISTORY — PX: SAVORY DILATION: SHX5439

## 2014-09-19 HISTORY — PX: ESOPHAGOGASTRODUODENOSCOPY: SHX5428

## 2014-09-19 SURGERY — EGD (ESOPHAGOGASTRODUODENOSCOPY)
Anesthesia: Moderate Sedation

## 2014-09-19 MED ORDER — ONDANSETRON HCL 4 MG/2ML IJ SOLN
INTRAMUSCULAR | Status: AC
  Filled 2014-09-19: qty 2

## 2014-09-19 MED ORDER — ONDANSETRON HCL 4 MG/2ML IJ SOLN
INTRAMUSCULAR | Status: DC | PRN
  Administered 2014-09-19: 4 mg via INTRAVENOUS

## 2014-09-19 MED ORDER — LIDOCAINE VISCOUS 2 % MT SOLN
OROMUCOSAL | Status: DC | PRN
  Administered 2014-09-19: 3 mL via OROMUCOSAL

## 2014-09-19 MED ORDER — MEPERIDINE HCL 100 MG/ML IJ SOLN
INTRAMUSCULAR | Status: DC | PRN
  Administered 2014-09-19 (×2): 25 mg via INTRAVENOUS

## 2014-09-19 MED ORDER — LIDOCAINE VISCOUS 2 % MT SOLN
OROMUCOSAL | Status: AC
  Filled 2014-09-19: qty 15

## 2014-09-19 MED ORDER — MIDAZOLAM HCL 5 MG/5ML IJ SOLN
INTRAMUSCULAR | Status: DC | PRN
  Administered 2014-09-19: 1 mg via INTRAVENOUS
  Administered 2014-09-19 (×2): 2 mg via INTRAVENOUS

## 2014-09-19 MED ORDER — MIDAZOLAM HCL 5 MG/5ML IJ SOLN
INTRAMUSCULAR | Status: AC
  Filled 2014-09-19: qty 10

## 2014-09-19 MED ORDER — STERILE WATER FOR IRRIGATION IR SOLN
Status: DC | PRN
  Administered 2014-09-19: 15:00:00

## 2014-09-19 MED ORDER — MEPERIDINE HCL 100 MG/ML IJ SOLN
INTRAMUSCULAR | Status: AC
  Filled 2014-09-19: qty 2

## 2014-09-19 NOTE — Op Note (Signed)
[facility] [address] [place], [address]   ENDOSCOPY PROCEDURE REPORT  PATIENT: Theresa, Kramer  MR#: [id number] BIRTHDATE: April 30, 1941 , 5  yrs. old GENDER: female ENDOSCOPIST: R.  Garfield Cornea, MD FACP FACG REFERRED BY:  Asencion Noble, M.D. PROCEDURE DATE:  Sep 22, 2014 PROCEDURE:  EGD w/ balloon dilation INDICATIONS:  Recurrent esophageal dysphagia; history of a Schatzki's ring. MEDICATIONS: Versed 5 mg IV and Demerol 50 mg IV in divided doses. Xylocaine gel orally.  Zofran 4 mg IV. ASA CLASS:      Class III  CONSENT: The risks, benefits, limitations, alternatives and imponderables have been discussed.  The potential for biopsy, esophogeal dilation, etc. have also been reviewed.  Questions have been answered.  All parties agreeable.  Please see the history and physical in the medical record for more information.  DESCRIPTION OF PROCEDURE: After the risks benefits and alternatives of the procedure were thoroughly explained, informed consent was obtained.  The EG-[id number] ([id number]) endoscope was introduced through the mouth and advanced to the second portion of the duodenum , limited by Without limitations. The instrument was slowly withdrawn as the mucosa was fully examined.    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  There was  no esophagitis.  No Barrett's esophagus.  There were 3 skinny rings nearly superimposed on one another in tandem at the GE junction. The scope traverses this segment without any difficulty.  Small hiatal hernia.  Stomach empty.  Normal gastric mucosa.  Patent pylorus.  Normal first and second portion of the duodenum.   Blind Bougie dilation unsafe with large esophageal diverticulum.  I passed and inflated a 20 mm TTS balloon across the ring for 1 minute and took it down.  However, the balloon  really wasn't enough large enough to get an adequate dilation(largest balloon we have in stock).  Subsequently, a utilizing jumbo biopsy forceps, I took 4 quadrant "bites" of the rings to disrupt them.  This was done effectively without apparent complication.  There was minimal bleeding.  Retroflexed views revealed as previously described. The scope was then withdrawn from the patient and the procedure completed.  COMPLICATIONS: There were no immediate complications.  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 Multiple distal rings dilated and disrupted as described above. Small hiatal hernia.  RECOMMENDATIONS: Continue Nexium/Zantac daily. Office visit with [place] in 6 months. Any future dilations, should employ guidewire dilation under fluoroscopy.   Office visit with [place] in 6 months  REPEAT EXAM:  eSigned:  R. Garfield Cornea, MD [facility] 2014/09/22 3:26 PM    CC:  CPT CODES: ICD CODES:  The ICD and CPT codes recommended by this software are interpretations from the data that the clinical staff has captured with the software.  The verification of the translation of this report to the ICD and CPT codes and modifiers is the sole responsibility of the health care institution and practicing physician where this report was generated.  [facility] will not be held responsible for the validity of the ICD and CPT codes included on this report.  AMA assumes no liability for data contained or not contained herein. CPT is a Designer, television/film set of the [employer].  PATIENT NAME:  Theresa, Kramer MR#: [id number]

## 2014-09-19 NOTE — Discharge Instructions (Signed)
EGD Discharge instructions Please read the instructions outlined below and refer to this sheet in the next few weeks. These discharge instructions provide you with general information on caring for yourself after you leave the hospital. Your doctor may also give you specific instructions. While your treatment has been planned according to the most current medical practices available, unavoidable complications occasionally occur. If you have any problems or questions after discharge, please call your doctor. ACTIVITY  You may resume your regular activity but move at a slower pace for the next 24 hours.   Take frequent rest periods for the next 24 hours.   Walking will help expel (get rid of) the air and reduce the bloated feeling in your abdomen.   No driving for 24 hours (because of the anesthesia (medicine) used during the test).   You may shower.   Do not sign any important legal documents or operate any machinery for 24 hours (because of the anesthesia used during the test).  NUTRITION  Drink plenty of fluids.   You may resume your normal diet.   Begin with a light meal and progress to your normal diet.   Avoid alcoholic beverages for 24 hours or as instructed by your caregiver.  MEDICATIONS  You may resume your normal medications unless your caregiver tells you otherwise.  WHAT YOU CAN EXPECT TODAY  You may experience abdominal discomfort such as a feeling of fullness or gas pains.  FOLLOW-UP  Your doctor will discuss the results of your test with you.  SEEK IMMEDIATE MEDICAL ATTENTION IF ANY OF THE FOLLOWING OCCUR:  Excessive nausea (feeling sick to your stomach) and/or vomiting.   Severe abdominal pain and distention (swelling).   Trouble swallowing.   Temperature over 101 F (37.8 C).   Rectal bleeding or vomiting of blood.   Continue Nexium/Zantac daily  Swallowing precautions reviewed with the family  Office visit with [place] in 6  months   Dysphagia Swallowing problems (dysphagia) occur when solids and liquids seem to stick in your throat on the way down to your stomach, or the food takes longer to get to the stomach. Other symptoms include regurgitating food, noises coming from the throat, chest discomfort with swallowing, and a feeling of fullness or the feeling of something being stuck in your throat when swallowing. When blockage in your throat is complete, it may be associated with drooling. CAUSES  Problems with swallowing may occur because of problems with the muscles. The food cannot be propelled in the usual manner into your stomach. You may have ulcers, scar tissue, or inflammation in the tube down which food travels from your mouth to your stomach (esophagus), which blocks food from passing normally into the stomach. Causes of inflammation include: Acid reflux from your stomach into your esophagus. Infection. Radiation treatment for cancer. Medicines taken without enough fluids to wash them down into your stomach. You may have nerve problems that prevent signals from being sent to the muscles of your esophagus to contract and move your food down to your stomach. Globus pharyngeus is a relatively common problem in which there is a sense of an obstruction or difficulty in swallowing, without any physical abnormalities of the swallowing passages being found. This problem usually improves over time with reassurance and testing to rule out other causes. DIAGNOSIS Dysphagia can be diagnosed and its cause can be determined by tests in which you swallow a white substance that helps illuminate the inside of your throat (contrast medium) while X-rays are taken.  Sometimes a flexible telescope that is inserted down your throat (endoscopy) to look at your esophagus and stomach is used. TREATMENT  If the dysphagia is caused by acid reflux or infection, medicines may be used. If the dysphagia is caused by problems with your  swallowing muscles, swallowing therapy may be used to help you strengthen your swallowing muscles. If the dysphagia is caused by a blockage or mass, procedures to remove the blockage may be done. HOME CARE INSTRUCTIONS Try to eat soft food that is easier to swallow and check your weight on a daily basis to be sure that it is not decreasing. Be sure to drink liquids when sitting upright (not lying down). SEEK MEDICAL CARE IF: You are losing weight because you are unable to swallow. You are coughing when you drink liquids (aspiration). You are coughing up partially digested food. SEEK IMMEDIATE MEDICAL CARE IF: You are unable to swallow your own saliva . You are having shortness of breath or a fever, or both. You have a hoarse voice along with difficulty swallowing. MAKE SURE YOU: Understand these instructions. Will watch your condition. Will get help right away if you are not doing well or get worse. Document Released: 10/17/2000 Document Revised: 03/06/2014 Document Reviewed: 04/08/2013 [facility] Patient Information 2015 [place], [place]. This information is not intended to replace advice given to you by your health care provider. Make sure you discuss any questions you have with your health care provider.

## 2014-09-19 NOTE — H&P (Addendum)
@LOGO @   Primary Care Physician:  Asencion Noble, MD Primary Gastroenterologist:  Dr. Gala Romney  Pre-Procedure History & Physical: HPI:  Theresa Kramer is a 73 y.o. female here for EGD with probable esophageal dilation. History of Schatzki's ring-underwent dilation back in 2011. Did well until recently. Takes both Nexium and Zantac daily with excellent control reflux symptoms. No odynophagia. The Patient is a vague esophageal dysphagia to solids. Hasn't had any hematemesis melena or abdominal pain.  Past Medical History  Diagnosis Date  . Diabetes mellitus   . Hypertension   . Complication of anesthesia   . PONV (postoperative nausea and vomiting)   . Hyperlipidemia   . Bronchitis     hx of  . Urinary tract infection     hx of  . GERD (gastroesophageal reflux disease)   . H/O hiatal hernia   . Arthritis   . Depression     "not taking medication"  . Pinched cervical nerve root   . Noncompliance with medication regimen     Past Surgical History  Procedure Laterality Date  . Appendectomy    . Tonsillectomy    . Orthopedic surgery    . Rotator cuff repair      "multiple in both shoulders" and total shoulder replacement in left arm  . Knee surgery      torn cartilage repair  . Hip arthroplasty      right  . Eye surgery      bilateral cataract surgery,   . Dilation and curettage of uterus    . Abdominal hysterectomy    . Bone cyst excision      from lower back and foot  . Joint replacement    . Cholecystectomy    . Anterior cervical decomp/discectomy fusion  03/15/2012    Procedure: ANTERIOR CERVICAL DECOMPRESSION/DISCECTOMY FUSION 2 LEVELS;  Surgeon: Ophelia Charter, MD;  Location: [facility];  Service: Neurosurgery;  Laterality: N/A;  Cervical four-five Cervical five six anterior cervical decompression with fusion interbody prothesis plating and bonegraft    Prior to Admission medications   Medication Sig Start Date End Date Taking? Authorizing Provider  acetaminophen  (TYLENOL) 650 MG CR tablet Take 1,300 mg by mouth 2 (two) times daily.   Yes Historical Provider, MD  aspirin 81 MG tablet Take 81 mg by mouth daily.   Yes Historical Provider, MD  citalopram (CELEXA) 20 MG tablet Take 20 mg by mouth daily.     Yes Historical Provider, MD  clotrimazole (LOTRIMIN) 1 % cream Apply 1 application topically 2 (two) times daily as needed. For rash    Yes Historical Provider, MD  cyanocobalamin (,VITAMIN B-12,) 1000 MCG/ML injection Inject 1,000 mcg into the muscle every 30 (thirty) days. Usually on the 1st    Yes Historical Provider, MD  esomeprazole (NEXIUM) 40 MG capsule Take 40 mg by mouth daily before breakfast.     Yes Historical Provider, MD  gemfibrozil (LOPID) 600 MG tablet Take 600 mg by mouth 2 (two) times daily before a meal.     Yes Historical Provider, MD  glipiZIDE (GLUCOTROL XL) 5 MG 24 hr tablet Take 5 mg by mouth daily.     Yes Historical Provider, MD  insulin aspart (NOVOLOG FLEXPEN) 100 UNIT/ML injection Inject 12 Units into the skin 3 (three) times daily before meals.    Yes Historical Provider, MD  insulin glargine (LANTUS) 100 UNIT/ML injection Inject 65 Units into the skin at bedtime.    Yes Historical Provider, MD  lisinopril (  PRINIVIL,ZESTRIL) 40 MG tablet Take 40 mg by mouth daily.     Yes Historical Provider, MD  metFORMIN (GLUCOPHAGE) 1000 MG tablet Take 1,000 mg by mouth 2 (two) times daily with a meal.     Yes Historical Provider, MD  oxybutynin (DITROPAN-XL) 10 MG 24 hr tablet Take 10 mg by mouth 2 (two) times daily.     Yes Historical Provider, MD  pramipexole (MIRAPEX) 0.25 MG tablet Take 0.25 mg by mouth daily.     Yes Historical Provider, MD  pravastatin (PRAVACHOL) 40 MG tablet Take 40 mg by mouth daily.     Yes Historical Provider, MD  promethazine (PHENERGAN) 25 MG tablet Take 25 mg by mouth every 6 (six) hours as needed. For nausea    Yes Historical Provider, MD  ranitidine (ZANTAC) 150 MG tablet Take 150 mg by mouth at bedtime.   Yes  Historical Provider, MD  meclizine (ANTIVERT) 25 MG tablet Take 25 mg by mouth 3 (three) times daily as needed. For dizziness     Historical Provider, MD    Allergies as of 09/18/2014 - Review Complete 09/18/2014  Allergen Reaction Noted  . Meloxicam  03/11/2012    History reviewed. No pertinent family history.  History   Social History  . Marital Status: Widowed    Spouse Name: N/A    Number of Children: N/A  . Years of Education: N/A   Occupational History  . Not on file.   Social History Main Topics  . Smoking status: Former Research scientist (life sciences)  . Smokeless tobacco: Not on file     Comment: stopped 25 years ago  . Alcohol Use: No  . Drug Use: No  . Sexual Activity: Not on file   Other Topics Concern  . Not on file   Social History Narrative    Review of Systems: See HPI, otherwise negative ROS  Physical Exam: BP 145/74 mmHg  Pulse 72  Resp 20  SpO2 93% General:   Obese.Alert,  Well-developed, well-nourished, pleasant and cooperative in NAD Skin:  Intact without significant lesions or rashes. Eyes:  Sclera clear, no icterus.   Conjunctiva pink. Ears:  Normal auditory acuity. Nose:  No deformity, discharge,  or lesions. Mouth:  No deformity or lesions. Neck:  Supple; no masses or thyromegaly. No significant cervical adenopathy. Lungs:  Clear throughout to auscultation.   No wheezes, crackles, or rhonchi. No acute distress. Heart:  Regular rate and rhythm; no murmurs, clicks, rubs,  or gallops. Abdomen: obese. Non-distended, normal bowel sounds.  Soft and nontender without appreciable mass or hepatosplenomegaly.  Pulses:  Normal pulses noted. Extremities:  Without clubbing or edema.  Impression/recommendations:  73 year old lady with recurrent esophageal dysphagia segment and Schatzki's ring. Also had a proximately dilated esophagus with a mid esophageal diverticulum. Agree with need for repeat EGD with dilation. The risks, benefits, limitations, alternatives and  imponderables have been reviewed with the patient. Potential for esophageal dilation, biopsy, etc. have also been reviewed.  Questions have been answered. All parties agreeable.     Notice: This dictation was prepared with Dragon dictation along with smaller phrase technology. Any transcriptional errors that result from this process are unintentional and may not be corrected upon review.

## 2014-09-20 ENCOUNTER — Encounter (HOSPITAL_COMMUNITY): Payer: Self-pay | Admitting: Internal Medicine

## 2014-09-20 LAB — GLUCOSE, CAPILLARY: Glucose-Capillary: 94 mg/dL (ref 70–99)

## 2014-10-06 ENCOUNTER — Ambulatory Visit (HOSPITAL_COMMUNITY): Payer: Medicare Other

## 2014-10-23 ENCOUNTER — Ambulatory Visit (HOSPITAL_COMMUNITY)
Admission: RE | Admit: 2014-10-23 | Discharge: 2014-10-23 | Disposition: A | Discharge status: Home / Self Care | Payer: Medicare Other | Source: Ambulatory Visit | Attending: Internal Medicine | Admitting: Internal Medicine

## 2014-10-23 DIAGNOSIS — Z1231 Encounter for screening mammogram for malignant neoplasm of breast: Secondary | ICD-10-CM | POA: Diagnosis not present

## 2014-11-06 DIAGNOSIS — R05 Cough: Secondary | ICD-10-CM | POA: Diagnosis not present

## 2014-11-20 DIAGNOSIS — M47816 Spondylosis without myelopathy or radiculopathy, lumbar region: Secondary | ICD-10-CM | POA: Diagnosis not present

## 2014-11-20 DIAGNOSIS — M5416 Radiculopathy, lumbar region: Secondary | ICD-10-CM | POA: Diagnosis not present

## 2014-11-20 DIAGNOSIS — M7062 Trochanteric bursitis, left hip: Secondary | ICD-10-CM | POA: Diagnosis not present

## 2015-02-05 DIAGNOSIS — E785 Hyperlipidemia, unspecified: Secondary | ICD-10-CM | POA: Diagnosis not present

## 2015-02-05 DIAGNOSIS — Z79899 Other long term (current) drug therapy: Secondary | ICD-10-CM | POA: Diagnosis not present

## 2015-02-05 DIAGNOSIS — I10 Essential (primary) hypertension: Secondary | ICD-10-CM | POA: Diagnosis not present

## 2015-02-05 DIAGNOSIS — E119 Type 2 diabetes mellitus without complications: Secondary | ICD-10-CM | POA: Diagnosis not present

## 2015-02-13 DIAGNOSIS — E1129 Type 2 diabetes mellitus with other diabetic kidney complication: Secondary | ICD-10-CM | POA: Diagnosis not present

## 2015-02-13 DIAGNOSIS — E785 Hyperlipidemia, unspecified: Secondary | ICD-10-CM | POA: Diagnosis not present

## 2015-02-13 DIAGNOSIS — I10 Essential (primary) hypertension: Secondary | ICD-10-CM | POA: Diagnosis not present

## 2015-03-12 DIAGNOSIS — J40 Bronchitis, not specified as acute or chronic: Secondary | ICD-10-CM | POA: Diagnosis not present

## 2015-03-20 ENCOUNTER — Encounter: Payer: Self-pay | Admitting: Gastroenterology

## 2015-03-20 ENCOUNTER — Ambulatory Visit (INDEPENDENT_AMBULATORY_CARE_PROVIDER_SITE_OTHER): Payer: Medicare Other | Admitting: Gastroenterology

## 2015-03-20 VITALS — BP 155/71 | HR 85 | Temp 97.0°F | Ht 66.0 in | Wt 229.4 lb

## 2015-03-20 DIAGNOSIS — Q394 Esophageal web: Secondary | ICD-10-CM | POA: Diagnosis not present

## 2015-03-20 DIAGNOSIS — K222 Esophageal obstruction: Secondary | ICD-10-CM

## 2015-03-20 DIAGNOSIS — K219 Gastro-esophageal reflux disease without esophagitis: Secondary | ICD-10-CM

## 2015-03-20 NOTE — Assessment & Plan Note (Signed)
74 year old lady with history of esophageal dysphagia status post EGD with dilation of esophageal rings. She has a history of large mid esophageal diverticulum therefore recommended that next EGD with dilation utilize guidewire under fluoroscopy given risk of perforation. Patient's daughter has been educated regarding this issue. She is doing very well this time. No dysphagia. GERD well controlled. Continue pantoprazole as before. Patient will continue to follow-up with Dr. Willey Blade regarding her reflux disease. They will notify us if she has any recurrent dysphagia. Otherwise we'll see her back in 2021 for her next colonoscopy.

## 2015-03-20 NOTE — Progress Notes (Signed)
Primary Care Physician: Asencion Noble, MD  Primary Gastroenterologist:  Garfield Cornea, MD   Chief Complaint  Patient presents with  . Follow-up    HPI: Theresa Kramer is a 73 y.o. female here for follow-up of EGD in November 2015 for recurrent dysphagia. She has a rather large mid esophageal diverticulum. She had 3 rings nearly superimposed on one another in tandem at the GE junction. Small hiatal hernia. Given large esophageal diverticulum, blind bougie dilation was not safe therefore she had balloon dilation to 20 mm. This was large balloon available and did not disrupt the rings. Jumbo biopsy forceps were utilized to disrupt the rings. It was recommended that any future dilations should employ guidewire dilation under fluoroscopy given risk of perforation related to the large diverticulum.  Patient presents with daughter today. Dysphagia completely resolved. Doing very well on pantoprazole 40 mg daily. Takes Zantac at bedtime. Rarely has heartburn. If she does she takes additional Pepcid. Daughter is monitoring her medications completely. Patient lives with her. There is been no vomiting, abdominal pain. Bowel movements are regular. No blood in the stool or melena.  Patient's last colonoscopy was in 2011. Some compromise of exam with poor prep but it was recommended that she wait until 2021 before her next colonoscopy. Patient is aware.   Current Outpatient Prescriptions  Medication Sig Dispense Refill  . acetaminophen (TYLENOL) 650 MG CR tablet Take 1,300 mg by mouth 2 (two) times daily.    Marland Kitchen amLODipine (NORVASC) 5 MG tablet Take 5 mg by mouth daily.    Marland Kitchen aspirin 81 MG tablet Take 81 mg by mouth daily.    . benzonatate (TESSALON) 200 MG capsule Take 200 mg by mouth 3 (three) times daily as needed for cough.    . citalopram (CELEXA) 20 MG tablet Take 20 mg by mouth daily.      . clotrimazole (LOTRIMIN) 1 % cream Apply 1 application topically 2 (two) times daily as needed. For rash      . cyanocobalamin (,VITAMIN B-12,) 1000 MCG/ML injection Inject 1,000 mcg into the muscle every 30 (thirty) days. Usually on the 1st     . gemfibrozil (LOPID) 600 MG tablet Take 600 mg by mouth 2 (two) times daily before a meal.      . glipiZIDE (GLUCOTROL XL) 5 MG 24 hr tablet Take 5 mg by mouth daily.      . insulin aspart (NOVOLOG FLEXPEN) 100 UNIT/ML injection Inject 12 Units into the skin 3 (three) times daily before meals.     . insulin glargine (LANTUS) 100 UNIT/ML injection Inject 70 Units into the skin at bedtime.     Marland Kitchen lisinopril (PRINIVIL,ZESTRIL) 40 MG tablet Take 40 mg by mouth daily.      Marland Kitchen losartan (COZAAR) 100 MG tablet Take 100 mg by mouth daily.    . meclizine (ANTIVERT) 25 MG tablet Take 25 mg by mouth 3 (three) times daily as needed. For dizziness     . metFORMIN (GLUCOPHAGE) 1000 MG tablet Take 1,000 mg by mouth 2 (two) times daily with a meal.      . oxybutynin (DITROPAN-XL) 10 MG 24 hr tablet Take 10 mg by mouth daily.     . pantoprazole (PROTONIX) 40 MG tablet Take 40 mg by mouth daily.    . pramipexole (MIRAPEX) 0.25 MG tablet Take 0.25 mg by mouth daily.      . pravastatin (PRAVACHOL) 40 MG tablet Take 40 mg by mouth daily.      Marland Kitchen  promethazine (PHENERGAN) 25 MG tablet Take 25 mg by mouth every 6 (six) hours as needed. For nausea     . ranitidine (ZANTAC) 150 MG tablet Take 150 mg by mouth at bedtime.    . traMADol (ULTRAM) 50 MG tablet Take by mouth every 6 (six) hours as needed. Just got yesterday/ unsure of dosage at this time     No current facility-administered medications for this visit.    Allergies as of 03/20/2015 - Review Complete 03/20/2015  Allergen Reaction Noted  . Meloxicam  03/11/2012   Past Medical History  Diagnosis Date  . Diabetes mellitus   . Hypertension   . Complication of anesthesia   . PONV (postoperative nausea and vomiting)   . Hyperlipidemia   . Bronchitis     hx of  . Urinary tract infection     hx of  . GERD (gastroesophageal  reflux disease)   . H/O hiatal hernia   . Arthritis   . Depression     "not taking medication"  . Pinched cervical nerve root   . Noncompliance with medication regimen   . B12 deficiency    Past Surgical History  Procedure Laterality Date  . Appendectomy    . Tonsillectomy    . Orthopedic surgery    . Rotator cuff repair      "multiple in both shoulders" and total shoulder replacement in left arm  . Knee surgery      torn cartilage repair  . Hip arthroplasty      right  . Eye surgery      bilateral cataract surgery,   . Dilation and curettage of uterus    . Abdominal hysterectomy    . Bone cyst excision      from lower back and foot  . Joint replacement    . Cholecystectomy    . Anterior cervical decomp/discectomy fusion  03/15/2012    Procedure: ANTERIOR CERVICAL DECOMPRESSION/DISCECTOMY FUSION 2 LEVELS;  Surgeon: Ophelia Charter, MD;  Location: [facility];  Service: Neurosurgery;  Laterality: N/A;  Cervical four-five Cervical five six anterior cervical decompression with fusion interbody prothesis plating and bonegraft  . 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 Multiple distal rings dilated and disrupted as described above. Small hiatal hernia.   Venia Minks dilation N/A 09/19/2014    Procedure: Venia Minks DILATION;  Surgeon: Daneil Dolin, MD;  Location: [facility];  Service: Endoscopy;  Laterality: N/A;  . Savory dilation N/A 09/19/2014    Procedure: SAVORY DILATION;  Surgeon: Daneil Dolin, MD;  Location: [facility];  Service: Endoscopy;  Laterality: N/A;  . Colonoscopy  2011    Dr. Gala Romney: prep compromised exam. diverticulosis, hemorrhoids. next tcs 2021    ROS:  General: Negative for anorexia, weight loss, fever, chills, fatigue, weakness. ENT: Negative for hoarseness, difficulty swallowing , nasal congestion. CV: Negative for chest pain, angina, palpitations, dyspnea on exertion,  peripheral edema.  Respiratory: Negative for dyspnea at rest, dyspnea on exertion, cough, sputum, wheezing.  GI: See history of present illness. GU:  Negative for dysuria, hematuria, urinary incontinence, urinary frequency, nocturnal urination.  Endo: Negative for unusual weight change.    Physical Examination:   BP 155/71 mmHg  Pulse 85  Temp(Src) 97 F (36.1 C) (Oral)  Ht 5\' 6"  (1.676 m)  Wt 229 lb 6.4 oz (104.055 kg)  BMI 37.04 kg/m2  General: Well-nourished, well-developed in no acute distress.  Eyes: No icterus. Mouth:  Oropharyngeal mucosa moist and pink , no lesions erythema or exudate. Lungs: Clear to auscultation bilaterally.  Heart: Regular rate and rhythm, no murmurs rubs or gallops.  Abdomen: Bowel sounds are normal, nontender, nondistended, no hepatosplenomegaly or masses, no abdominal bruits or hernia , no rebound or guarding.   Extremities: No lower extremity edema. No clubbing or deformities. Neuro: Alert and oriented x 4   Skin: Warm and dry, no jaundice.   Psych: Alert and cooperative, normal mood and affect.   Imaging Studies: No results found.

## 2015-03-20 NOTE — Patient Instructions (Signed)
1. Please call us if you have recurrent swallowing problems.  2. You will be due for next colonoscopy in 2021.

## 2015-03-21 NOTE — Progress Notes (Signed)
cc'ed to pcp °

## 2015-03-30 DIAGNOSIS — M7062 Trochanteric bursitis, left hip: Secondary | ICD-10-CM | POA: Diagnosis not present

## 2015-04-23 DIAGNOSIS — M545 Low back pain: Secondary | ICD-10-CM | POA: Diagnosis not present

## 2015-04-23 DIAGNOSIS — M7062 Trochanteric bursitis, left hip: Secondary | ICD-10-CM | POA: Diagnosis not present

## 2015-04-23 DIAGNOSIS — M25552 Pain in left hip: Secondary | ICD-10-CM | POA: Diagnosis not present

## 2015-05-21 DIAGNOSIS — E119 Type 2 diabetes mellitus without complications: Secondary | ICD-10-CM | POA: Diagnosis not present

## 2015-05-29 DIAGNOSIS — Z6839 Body mass index (BMI) 39.0-39.9, adult: Secondary | ICD-10-CM | POA: Diagnosis not present

## 2015-05-29 DIAGNOSIS — I10 Essential (primary) hypertension: Secondary | ICD-10-CM | POA: Diagnosis not present

## 2015-05-29 DIAGNOSIS — E1129 Type 2 diabetes mellitus with other diabetic kidney complication: Secondary | ICD-10-CM | POA: Diagnosis not present

## 2015-07-07 DIAGNOSIS — M25561 Pain in right knee: Secondary | ICD-10-CM | POA: Diagnosis not present

## 2015-08-05 DIAGNOSIS — Z23 Encounter for immunization: Secondary | ICD-10-CM | POA: Diagnosis not present

## 2015-08-22 DIAGNOSIS — E119 Type 2 diabetes mellitus without complications: Secondary | ICD-10-CM | POA: Diagnosis not present

## 2015-08-23 DIAGNOSIS — E119 Type 2 diabetes mellitus without complications: Secondary | ICD-10-CM | POA: Diagnosis not present

## 2015-08-23 DIAGNOSIS — R05 Cough: Secondary | ICD-10-CM | POA: Diagnosis not present

## 2015-08-23 DIAGNOSIS — Z6839 Body mass index (BMI) 39.0-39.9, adult: Secondary | ICD-10-CM | POA: Diagnosis not present

## 2015-09-10 DIAGNOSIS — H43811 Vitreous degeneration, right eye: Secondary | ICD-10-CM | POA: Diagnosis not present

## 2015-09-10 DIAGNOSIS — E119 Type 2 diabetes mellitus without complications: Secondary | ICD-10-CM | POA: Diagnosis not present

## 2015-09-10 DIAGNOSIS — Z961 Presence of intraocular lens: Secondary | ICD-10-CM | POA: Diagnosis not present

## 2015-09-10 DIAGNOSIS — Z794 Long term (current) use of insulin: Secondary | ICD-10-CM | POA: Diagnosis not present

## 2015-09-18 DIAGNOSIS — M47817 Spondylosis without myelopathy or radiculopathy, lumbosacral region: Secondary | ICD-10-CM | POA: Diagnosis not present

## 2015-09-18 DIAGNOSIS — M545 Low back pain: Secondary | ICD-10-CM | POA: Diagnosis not present

## 2015-10-23 DIAGNOSIS — L570 Actinic keratosis: Secondary | ICD-10-CM | POA: Diagnosis not present

## 2015-10-23 DIAGNOSIS — L719 Rosacea, unspecified: Secondary | ICD-10-CM | POA: Diagnosis not present

## 2015-10-25 ENCOUNTER — Other Ambulatory Visit (HOSPITAL_COMMUNITY): Payer: Self-pay | Admitting: Internal Medicine

## 2015-10-25 DIAGNOSIS — Z1231 Encounter for screening mammogram for malignant neoplasm of breast: Secondary | ICD-10-CM

## 2015-11-01 ENCOUNTER — Ambulatory Visit (HOSPITAL_COMMUNITY)
Admission: RE | Admit: 2015-11-01 | Discharge: 2015-11-01 | Disposition: A | Discharge status: Home / Self Care | Payer: Medicare Other | Source: Ambulatory Visit | Attending: Internal Medicine | Admitting: Internal Medicine

## 2015-11-01 DIAGNOSIS — Z1231 Encounter for screening mammogram for malignant neoplasm of breast: Secondary | ICD-10-CM | POA: Diagnosis not present

## 2015-11-08 DIAGNOSIS — R05 Cough: Secondary | ICD-10-CM | POA: Diagnosis not present

## 2015-11-12 DIAGNOSIS — E119 Type 2 diabetes mellitus without complications: Secondary | ICD-10-CM | POA: Diagnosis not present

## 2015-11-16 DIAGNOSIS — E1129 Type 2 diabetes mellitus with other diabetic kidney complication: Secondary | ICD-10-CM | POA: Diagnosis not present

## 2015-11-16 DIAGNOSIS — I10 Essential (primary) hypertension: Secondary | ICD-10-CM | POA: Diagnosis not present

## 2015-12-20 DIAGNOSIS — R05 Cough: Secondary | ICD-10-CM | POA: Diagnosis not present

## 2016-01-23 DIAGNOSIS — M25532 Pain in left wrist: Secondary | ICD-10-CM | POA: Diagnosis not present

## 2016-01-30 DIAGNOSIS — R2232 Localized swelling, mass and lump, left upper limb: Secondary | ICD-10-CM | POA: Diagnosis not present

## 2016-02-01 ENCOUNTER — Other Ambulatory Visit: Payer: Self-pay | Admitting: Orthopedic Surgery

## 2016-02-01 DIAGNOSIS — M79642 Pain in left hand: Secondary | ICD-10-CM

## 2016-02-12 DIAGNOSIS — I10 Essential (primary) hypertension: Secondary | ICD-10-CM | POA: Diagnosis not present

## 2016-02-12 DIAGNOSIS — E119 Type 2 diabetes mellitus without complications: Secondary | ICD-10-CM | POA: Diagnosis not present

## 2016-02-12 DIAGNOSIS — E785 Hyperlipidemia, unspecified: Secondary | ICD-10-CM | POA: Diagnosis not present

## 2016-02-12 DIAGNOSIS — Z79899 Other long term (current) drug therapy: Secondary | ICD-10-CM | POA: Diagnosis not present

## 2016-02-12 DIAGNOSIS — K219 Gastro-esophageal reflux disease without esophagitis: Secondary | ICD-10-CM | POA: Diagnosis not present

## 2016-02-13 ENCOUNTER — Ambulatory Visit
Admission: RE | Admit: 2016-02-13 | Discharge: 2016-02-13 | Disposition: A | Discharge status: Home / Self Care | Payer: Medicare Other | Source: Ambulatory Visit | Attending: Orthopedic Surgery | Admitting: Orthopedic Surgery

## 2016-02-13 DIAGNOSIS — R2232 Localized swelling, mass and lump, left upper limb: Secondary | ICD-10-CM | POA: Diagnosis not present

## 2016-02-13 DIAGNOSIS — M79642 Pain in left hand: Secondary | ICD-10-CM

## 2016-02-19 DIAGNOSIS — E1129 Type 2 diabetes mellitus with other diabetic kidney complication: Secondary | ICD-10-CM | POA: Diagnosis not present

## 2016-02-19 DIAGNOSIS — E785 Hyperlipidemia, unspecified: Secondary | ICD-10-CM | POA: Diagnosis not present

## 2016-02-19 DIAGNOSIS — M5136 Other intervertebral disc degeneration, lumbar region: Secondary | ICD-10-CM | POA: Diagnosis not present

## 2016-02-27 ENCOUNTER — Other Ambulatory Visit: Payer: Self-pay | Admitting: Orthopedic Surgery

## 2016-02-27 DIAGNOSIS — R2232 Localized swelling, mass and lump, left upper limb: Secondary | ICD-10-CM | POA: Diagnosis not present

## 2016-02-27 NOTE — H&P (Signed)
Theresa Kramer is an 75 y.o. female.   CC / Reason for Visit: Left dorsal hand mass HPI: This patient returns to clinic for reevaluation and review of the MRI of her left hand.  She would like to move forward with excision of the mass on her left dorsal hand.  HPI 01/30/2016:This patient is a 75 year old, right-hand-dominant, female who indicates that she has had dorsal hand problems since 2015.  She indicates that there is not one mechanism of injury and that she has been treated with bracing as well as some sort of steroid cream which was quite helpful.  Recently, her problem has returned and she saw Dr. Mardelle Matte in clinic who felt as though she may need to see a hand specialist for further evaluation and treatment.  This patient is a diabetic.  Past Medical History  Diagnosis Date  . Diabetes mellitus   . Hypertension   . Complication of anesthesia   . PONV (postoperative nausea and vomiting)   . Hyperlipidemia   . Bronchitis     hx of  . Urinary tract infection     hx of  . GERD (gastroesophageal reflux disease)   . H/O hiatal hernia   . Arthritis   . Depression     "not taking medication"  . Pinched cervical nerve root   . Noncompliance with medication regimen   . B12 deficiency     Past Surgical History  Procedure Laterality Date  . Appendectomy    . Tonsillectomy    . Orthopedic surgery    . Rotator cuff repair      "multiple in both shoulders" and total shoulder replacement in left arm  . Knee surgery      torn cartilage repair  . Hip arthroplasty      right  . Eye surgery      bilateral cataract surgery,   . Dilation and curettage of uterus    . Abdominal hysterectomy    . Bone cyst excision      from lower back and foot  . Joint replacement    . Cholecystectomy    . Anterior cervical decomp/discectomy fusion  03/15/2012    Procedure: ANTERIOR CERVICAL DECOMPRESSION/DISCECTOMY FUSION 2 LEVELS;  Surgeon: Ophelia Charter, MD;  Location: [facility];  Service:  Neurosurgery;  Laterality: N/A;  Cervical four-five Cervical five six anterior cervical decompression with fusion interbody prothesis plating and bonegraft  . 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 Multiple distal rings dilated and disrupted as described above. Small hiatal hernia.   Venia Minks dilation N/A 09/19/2014    Procedure: Venia Minks DILATION;  Surgeon: Daneil Dolin, MD;  Location: [facility];  Service: Endoscopy;  Laterality: N/A;  . Savory dilation N/A 09/19/2014    Procedure: SAVORY DILATION;  Surgeon: Daneil Dolin, MD;  Location: [facility];  Service: Endoscopy;  Laterality: N/A;  . Colonoscopy  2011    Dr. Gala Romney: prep compromised exam. diverticulosis, hemorrhoids. next tcs 2021    Family History  Problem Relation Age of Onset  . Cirrhosis Brother     etoh  . Colon cancer Neg Hx    Social History:  reports that she has quit smoking. She does not have any smokeless tobacco history on file. She reports that she does not drink alcohol or use illicit drugs.  Allergies:  Allergies  Allergen Reactions  . Meloxicam     No prescriptions prior to admission  No results found for this or any previous visit (from the past 48 hour(s)). No results found.  Review of Systems  All other systems reviewed and are negative.   There were no vitals taken for this visit. Physical Exam  Constitutional:  WD, WN, NAD HEENT:  NCAT, EOMI Neuro/Psych:  Alert & oriented to person, place, and time; appropriate mood & affect Lymphatic: No generalized UE edema or lymphadenopathy Extremities / MSK:  Both UE are normal with respect to appearance, ranges of motion, joint stability, muscle strength/tone, sensation, & perfusion except as otherwise noted:  At the base of the left index finger metacarpal, there continues to be a mass on the extensor tendon which moves with extensor tendon excursion.    Labs / Xrays:   No new x-rays.  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  Please see the MRI report for further information.  Assessment: Mobile mass of soft tissue associated with extensor tendons to left index finger over hand  Plan:  The findings were discussed with the patient and her daughter.  She would like to move forward with a excision of her left dorsal hand mass.  The details of the operative procedure were discussed with the patient.  Questions were invited and answered.  In addition to the goal of the procedure, the risks of the procedure to include but not limited to bleeding; infection; damage to the nerves or blood vessels that could result in bleeding, numbness, weakness, chronic pain, and the need for additional procedures; stiffness; the need for revision surgery; and anesthetic risks were reviewed.  No specific outcome was guaranteed or implied.  Informed consent was obtained.   Farzad Tibbetts A., MD 02/27/2016, 6:23 PM

## 2016-02-28 ENCOUNTER — Encounter (HOSPITAL_BASED_OUTPATIENT_CLINIC_OR_DEPARTMENT_OTHER): Payer: Self-pay | Admitting: *Deleted

## 2016-02-28 NOTE — Progress Notes (Signed)
LVM for OR scheduler, case posted for the right side, pt's daughter states that it on the left.

## 2016-02-29 ENCOUNTER — Other Ambulatory Visit: Payer: Self-pay

## 2016-02-29 ENCOUNTER — Encounter (HOSPITAL_BASED_OUTPATIENT_CLINIC_OR_DEPARTMENT_OTHER)
Admission: RE | Admit: 2016-02-29 | Discharge: 2016-02-29 | Disposition: A | Discharge status: Home / Self Care | Payer: Medicare Other | Source: Ambulatory Visit | Attending: Orthopedic Surgery | Admitting: Orthopedic Surgery

## 2016-02-29 DIAGNOSIS — Z7982 Long term (current) use of aspirin: Secondary | ICD-10-CM | POA: Diagnosis not present

## 2016-02-29 DIAGNOSIS — M67932 Unspecified disorder of synovium and tendon, left forearm: Secondary | ICD-10-CM | POA: Diagnosis present

## 2016-02-29 DIAGNOSIS — K219 Gastro-esophageal reflux disease without esophagitis: Secondary | ICD-10-CM | POA: Diagnosis not present

## 2016-02-29 DIAGNOSIS — M67432 Ganglion, left wrist: Secondary | ICD-10-CM | POA: Diagnosis not present

## 2016-02-29 DIAGNOSIS — Z79899 Other long term (current) drug therapy: Secondary | ICD-10-CM | POA: Diagnosis not present

## 2016-02-29 DIAGNOSIS — F329 Major depressive disorder, single episode, unspecified: Secondary | ICD-10-CM | POA: Diagnosis not present

## 2016-02-29 DIAGNOSIS — Z96641 Presence of right artificial hip joint: Secondary | ICD-10-CM | POA: Diagnosis not present

## 2016-02-29 DIAGNOSIS — E785 Hyperlipidemia, unspecified: Secondary | ICD-10-CM | POA: Diagnosis not present

## 2016-02-29 DIAGNOSIS — M6588 Other synovitis and tenosynovitis, other site: Secondary | ICD-10-CM | POA: Diagnosis not present

## 2016-02-29 DIAGNOSIS — E538 Deficiency of other specified B group vitamins: Secondary | ICD-10-CM | POA: Diagnosis not present

## 2016-02-29 DIAGNOSIS — E119 Type 2 diabetes mellitus without complications: Secondary | ICD-10-CM | POA: Diagnosis not present

## 2016-02-29 DIAGNOSIS — I10 Essential (primary) hypertension: Secondary | ICD-10-CM | POA: Diagnosis not present

## 2016-02-29 DIAGNOSIS — Z794 Long term (current) use of insulin: Secondary | ICD-10-CM | POA: Diagnosis not present

## 2016-02-29 DIAGNOSIS — Z96612 Presence of left artificial shoulder joint: Secondary | ICD-10-CM | POA: Diagnosis not present

## 2016-02-29 DIAGNOSIS — Z87891 Personal history of nicotine dependence: Secondary | ICD-10-CM | POA: Diagnosis not present

## 2016-02-29 LAB — BASIC METABOLIC PANEL
ANION GAP: 13 (ref 5–15)
BUN: 19 mg/dL (ref 6–20)
CHLORIDE: 103 mmol/L (ref 101–111)
CO2: 23 mmol/L (ref 22–32)
Calcium: 9.7 mg/dL (ref 8.9–10.3)
Creatinine, Ser: 0.72 mg/dL (ref 0.44–1.00)
GFR calc Af Amer: >60 mL/min (ref >60)
Glucose, Bld: 140 mg/dL — ABNORMAL HIGH (ref 65–99)
POTASSIUM: 4.5 mmol/L (ref 3.5–5.1)
Sodium: 139 mmol/L (ref 135–145)

## 2016-03-03 ENCOUNTER — Ambulatory Visit (HOSPITAL_BASED_OUTPATIENT_CLINIC_OR_DEPARTMENT_OTHER): Payer: Medicare Other | Admitting: Certified Registered"

## 2016-03-03 ENCOUNTER — Encounter (HOSPITAL_BASED_OUTPATIENT_CLINIC_OR_DEPARTMENT_OTHER): Payer: Self-pay | Admitting: Certified Registered"

## 2016-03-03 ENCOUNTER — Ambulatory Visit (HOSPITAL_BASED_OUTPATIENT_CLINIC_OR_DEPARTMENT_OTHER)
Admission: RE | Admit: 2016-03-03 | Discharge: 2016-03-03 | Disposition: A | Discharge status: Home / Self Care | Payer: Medicare Other | Source: Ambulatory Visit | Attending: Orthopedic Surgery | Admitting: Orthopedic Surgery

## 2016-03-03 ENCOUNTER — Encounter (HOSPITAL_BASED_OUTPATIENT_CLINIC_OR_DEPARTMENT_OTHER)
Admission: RE | Disposition: A | Discharge status: Home / Self Care | Payer: Self-pay | Source: Ambulatory Visit | Attending: Orthopedic Surgery

## 2016-03-03 DIAGNOSIS — Z79899 Other long term (current) drug therapy: Secondary | ICD-10-CM | POA: Diagnosis not present

## 2016-03-03 DIAGNOSIS — E119 Type 2 diabetes mellitus without complications: Secondary | ICD-10-CM | POA: Insufficient documentation

## 2016-03-03 DIAGNOSIS — M6588 Other synovitis and tenosynovitis, other site: Secondary | ICD-10-CM | POA: Diagnosis not present

## 2016-03-03 DIAGNOSIS — M67432 Ganglion, left wrist: Secondary | ICD-10-CM | POA: Diagnosis not present

## 2016-03-03 DIAGNOSIS — M67442 Ganglion, left hand: Secondary | ICD-10-CM | POA: Diagnosis not present

## 2016-03-03 DIAGNOSIS — Z96641 Presence of right artificial hip joint: Secondary | ICD-10-CM | POA: Insufficient documentation

## 2016-03-03 DIAGNOSIS — Z96612 Presence of left artificial shoulder joint: Secondary | ICD-10-CM | POA: Diagnosis not present

## 2016-03-03 DIAGNOSIS — Z794 Long term (current) use of insulin: Secondary | ICD-10-CM | POA: Diagnosis not present

## 2016-03-03 DIAGNOSIS — E785 Hyperlipidemia, unspecified: Secondary | ICD-10-CM | POA: Diagnosis not present

## 2016-03-03 DIAGNOSIS — Z87891 Personal history of nicotine dependence: Secondary | ICD-10-CM | POA: Insufficient documentation

## 2016-03-03 DIAGNOSIS — Z7982 Long term (current) use of aspirin: Secondary | ICD-10-CM | POA: Insufficient documentation

## 2016-03-03 DIAGNOSIS — E538 Deficiency of other specified B group vitamins: Secondary | ICD-10-CM | POA: Insufficient documentation

## 2016-03-03 DIAGNOSIS — K219 Gastro-esophageal reflux disease without esophagitis: Secondary | ICD-10-CM | POA: Insufficient documentation

## 2016-03-03 DIAGNOSIS — E109 Type 1 diabetes mellitus without complications: Secondary | ICD-10-CM | POA: Diagnosis not present

## 2016-03-03 DIAGNOSIS — I10 Essential (primary) hypertension: Secondary | ICD-10-CM | POA: Insufficient documentation

## 2016-03-03 DIAGNOSIS — F329 Major depressive disorder, single episode, unspecified: Secondary | ICD-10-CM | POA: Insufficient documentation

## 2016-03-03 DIAGNOSIS — R2232 Localized swelling, mass and lump, left upper limb: Secondary | ICD-10-CM | POA: Diagnosis not present

## 2016-03-03 HISTORY — PX: EXCISION METACARPAL MASS: SHX6372

## 2016-03-03 LAB — GLUCOSE, CAPILLARY
GLUCOSE-CAPILLARY: 120 mg/dL — AB (ref 65–99)
Glucose-Capillary: 106 mg/dL — ABNORMAL HIGH (ref 65–99)

## 2016-03-03 SURGERY — EXCISION METACARPAL MASS
Anesthesia: General | Site: Hand | Laterality: Left

## 2016-03-03 MED ORDER — HYDROCODONE-ACETAMINOPHEN 5-325 MG PO TABS: 1.0000 | ORAL_TABLET | Freq: Four times a day (QID) | ORAL | Status: DC | PRN

## 2016-03-03 MED ORDER — FENTANYL CITRATE (PF) 100 MCG/2ML IJ SOLN: 25.0000 ug | INTRAMUSCULAR | Status: DC | PRN

## 2016-03-03 MED ORDER — GLYCOPYRROLATE 0.2 MG/ML IJ SOLN: 0.2000 mg | Freq: Once | INTRAMUSCULAR | Status: DC | PRN

## 2016-03-03 MED ORDER — CEFAZOLIN SODIUM-DEXTROSE 2-4 GM/100ML-% IV SOLN
2.0000 g | INTRAVENOUS | Status: AC
  Administered 2016-03-03: 2 g via INTRAVENOUS

## 2016-03-03 MED ORDER — 0.9 % SODIUM CHLORIDE (POUR BTL) OPTIME
TOPICAL | Status: DC | PRN
  Administered 2016-03-03: 200 mL

## 2016-03-03 MED ORDER — FENTANYL CITRATE (PF) 100 MCG/2ML IJ SOLN
INTRAMUSCULAR | Status: AC
  Filled 2016-03-03: qty 2

## 2016-03-03 MED ORDER — DEXAMETHASONE SODIUM PHOSPHATE 10 MG/ML IJ SOLN
INTRAMUSCULAR | Status: DC | PRN
  Administered 2016-03-03: 4 mg via INTRAVENOUS

## 2016-03-03 MED ORDER — MIDAZOLAM HCL 2 MG/2ML IJ SOLN: 1.0000 mg | INTRAMUSCULAR | Status: DC | PRN

## 2016-03-03 MED ORDER — EPHEDRINE SULFATE 50 MG/ML IJ SOLN
INTRAMUSCULAR | Status: DC | PRN
  Administered 2016-03-03: 10 mg via INTRAVENOUS

## 2016-03-03 MED ORDER — PROPOFOL 10 MG/ML IV BOLUS
INTRAVENOUS | Status: DC | PRN
  Administered 2016-03-03: 150 mg via INTRAVENOUS

## 2016-03-03 MED ORDER — EPHEDRINE 5 MG/ML INJ
INTRAVENOUS | Status: AC
  Filled 2016-03-03: qty 10

## 2016-03-03 MED ORDER — LIDOCAINE 2% (20 MG/ML) 5 ML SYRINGE
INTRAMUSCULAR | Status: AC
  Filled 2016-03-03: qty 5

## 2016-03-03 MED ORDER — HYDROCODONE-ACETAMINOPHEN 7.5-325 MG PO TABS: 1.0000 | ORAL_TABLET | Freq: Once | ORAL | Status: DC | PRN

## 2016-03-03 MED ORDER — DEXAMETHASONE SODIUM PHOSPHATE 10 MG/ML IJ SOLN
INTRAMUSCULAR | Status: AC
  Filled 2016-03-03: qty 1

## 2016-03-03 MED ORDER — LIDOCAINE HCL (CARDIAC) 20 MG/ML IV SOLN
INTRAVENOUS | Status: DC | PRN
  Administered 2016-03-03: 60 mg via INTRAVENOUS

## 2016-03-03 MED ORDER — ONDANSETRON HCL 4 MG/2ML IJ SOLN
INTRAMUSCULAR | Status: DC | PRN
  Administered 2016-03-03: 4 mg via INTRAVENOUS

## 2016-03-03 MED ORDER — SCOPOLAMINE 1 MG/3DAYS TD PT72: 1.0000 | MEDICATED_PATCH | Freq: Once | TRANSDERMAL | Status: DC | PRN

## 2016-03-03 MED ORDER — LACTATED RINGERS IV SOLN: INTRAVENOUS | Status: DC

## 2016-03-03 MED ORDER — BUPIVACAINE-EPINEPHRINE 0.5% -1:200000 IJ SOLN
INTRAMUSCULAR | Status: DC | PRN
  Administered 2016-03-03: 3 mL

## 2016-03-03 MED ORDER — FENTANYL CITRATE (PF) 100 MCG/2ML IJ SOLN
50.0000 ug | INTRAMUSCULAR | Status: DC | PRN
  Administered 2016-03-03: 50 ug via INTRAVENOUS

## 2016-03-03 MED ORDER — PROPOFOL 500 MG/50ML IV EMUL
INTRAVENOUS | Status: AC
  Filled 2016-03-03: qty 50

## 2016-03-03 MED ORDER — PROMETHAZINE HCL 25 MG/ML IJ SOLN: 6.2500 mg | INTRAMUSCULAR | Status: DC | PRN

## 2016-03-03 MED ORDER — LACTATED RINGERS IV SOLN
INTRAVENOUS | Status: DC
  Administered 2016-03-03: 08:00:00 via INTRAVENOUS

## 2016-03-03 MED ORDER — ONDANSETRON HCL 4 MG/2ML IJ SOLN
INTRAMUSCULAR | Status: AC
  Filled 2016-03-03: qty 2

## 2016-03-03 SURGICAL SUPPLY — 50 items
BANDAGE COBAN STERILE 2 (GAUZE/BANDAGES/DRESSINGS) IMPLANT
BLADE MINI RND TIP GREEN BEAV (BLADE) IMPLANT
BLADE SURG 15 STRL LF DISP TIS (BLADE) ×1 IMPLANT
BLADE SURG 15 STRL SS (BLADE) ×2
BNDG COHESIVE 1X5 TAN STRL LF (GAUZE/BANDAGES/DRESSINGS) IMPLANT
BNDG COHESIVE 4X5 TAN STRL (GAUZE/BANDAGES/DRESSINGS) ×3 IMPLANT
BNDG CONFORM 2 STRL LF (GAUZE/BANDAGES/DRESSINGS) IMPLANT
BNDG ESMARK 4X9 LF (GAUZE/BANDAGES/DRESSINGS) ×3 IMPLANT
BNDG GAUZE 1X2.1 STRL (MISCELLANEOUS) IMPLANT
BNDG GAUZE ELAST 4 BULKY (GAUZE/BANDAGES/DRESSINGS) ×3 IMPLANT
CHLORAPREP W/TINT 26ML (MISCELLANEOUS) ×3 IMPLANT
CORDS BIPOLAR (ELECTRODE) ×3 IMPLANT
COVER BACK TABLE 60X90IN (DRAPES) ×3 IMPLANT
COVER MAYO STAND STRL (DRAPES) ×3 IMPLANT
CUFF TOURNIQUET SINGLE 18IN (TOURNIQUET CUFF) ×3 IMPLANT
DRAIN PENROSE 1/2X12 LTX STRL (WOUND CARE) IMPLANT
DRAPE EXTREMITY T 121X128X90 (DRAPE) ×3 IMPLANT
DRAPE SURG 17X23 STRL (DRAPES) ×3 IMPLANT
DRSG EMULSION OIL 3X3 NADH (GAUZE/BANDAGES/DRESSINGS) ×3 IMPLANT
GLOVE BIO SURGEON STRL SZ7.5 (GLOVE) ×3 IMPLANT
GLOVE BIOGEL PI IND STRL 7.0 (GLOVE) ×2 IMPLANT
GLOVE BIOGEL PI IND STRL 7.5 (GLOVE) ×1 IMPLANT
GLOVE BIOGEL PI IND STRL 8 (GLOVE) ×1 IMPLANT
GLOVE BIOGEL PI INDICATOR 7.0 (GLOVE) ×4
GLOVE BIOGEL PI INDICATOR 7.5 (GLOVE) ×2
GLOVE BIOGEL PI INDICATOR 8 (GLOVE) ×2
GLOVE ECLIPSE 6.5 STRL STRAW (GLOVE) ×6 IMPLANT
GOWN STRL REUS W/ TWL LRG LVL3 (GOWN DISPOSABLE) ×2 IMPLANT
GOWN STRL REUS W/TWL LRG LVL3 (GOWN DISPOSABLE) ×4
GOWN STRL REUS W/TWL XL LVL3 (GOWN DISPOSABLE) ×3 IMPLANT
NDL SAFETY ECLIPSE 18X1.5 (NEEDLE) IMPLANT
NEEDLE HYPO 18GX1.5 SHARP (NEEDLE)
NEEDLE HYPO 25X1 1.5 SAFETY (NEEDLE) ×3 IMPLANT
NS IRRIG 1000ML POUR BTL (IV SOLUTION) ×3 IMPLANT
PACK BASIN DAY SURGERY FS (CUSTOM PROCEDURE TRAY) ×3 IMPLANT
PADDING CAST ABS 4INX4YD NS (CAST SUPPLIES) ×2
PADDING CAST ABS COTTON 4X4 ST (CAST SUPPLIES) ×1 IMPLANT
RUBBERBAND STERILE (MISCELLANEOUS) IMPLANT
SPLINT PLASTER CAST XFAST 3X15 (CAST SUPPLIES) ×7 IMPLANT
SPLINT PLASTER XTRA FASTSET 3X (CAST SUPPLIES) ×14
SPONGE GAUZE 4X4 12PLY STER LF (GAUZE/BANDAGES/DRESSINGS) ×3 IMPLANT
STOCKINETTE 6  STRL (DRAPES) ×2
STOCKINETTE 6 STRL (DRAPES) ×1 IMPLANT
SUT VICRYL RAPIDE 4-0 (SUTURE) IMPLANT
SUT VICRYL RAPIDE 4/0 PS 2 (SUTURE) ×3 IMPLANT
SYR BULB 3OZ (MISCELLANEOUS) ×3 IMPLANT
SYRINGE 10CC LL (SYRINGE) ×3 IMPLANT
TOWEL OR 17X24 6PK STRL BLUE (TOWEL DISPOSABLE) ×3 IMPLANT
TOWEL OR NON WOVEN STRL DISP B (DISPOSABLE) IMPLANT
UNDERPAD 30X30 (UNDERPADS AND DIAPERS) ×3 IMPLANT

## 2016-03-03 NOTE — Transfer of Care (Signed)
Immediate Anesthesia Transfer of Care Note  Patient: Theresa Kramer  Procedure(s) Performed: Procedure(s): EXCISION OF LEFT  DORSAL MASS OF EXTENSOR TENDON (Left)  Patient Location: PACU  Anesthesia Type:General  Level of Consciousness: awake and patient cooperative  Airway & Oxygen Therapy: Patient Spontanous Breathing and Patient connected to face mask oxygen  Post-op Assessment: Report given to RN and Post -op Vital signs reviewed and stable  Post vital signs: Reviewed and stable  Last Vitals:  Filed Vitals:   03/03/16 0807  BP: 124/53  Pulse: 79  Temp: 36.9 C  Resp: 20    Last Pain:  Filed Vitals:   03/03/16 0809  PainSc: 4       Patients Stated Pain Goal: 2 ([id number])  Complications: No apparent anesthesia complications

## 2016-03-03 NOTE — Anesthesia Preprocedure Evaluation (Addendum)
Anesthesia Evaluation  Patient identified by MRN, date of birth, ID band Patient awake    Reviewed: NPO status , Patient's Chart, lab work & pertinent test results  History of Anesthesia Complications (+) PONV  Airway Mallampati: II  TM Distance: >3 FB Neck ROM: Full    Dental  (+) Edentulous Lower, Edentulous Upper   Pulmonary former smoker,    breath sounds clear to auscultation       Cardiovascular hypertension, Pt. on medications  Rhythm:Regular Rate:Normal     Neuro/Psych PSYCHIATRIC DISORDERS Depression  Neuromuscular disease    GI/Hepatic hiatal hernia, GERD  Medicated,  Endo/Other  diabetes, Type 1, Insulin Dependent  Renal/GU      Musculoskeletal  (+) Arthritis ,   Abdominal   Peds  Hematology   Anesthesia Other Findings   Reproductive/Obstetrics                            Anesthesia Physical Anesthesia Plan  ASA: III  Anesthesia Plan: General   Post-op Pain Management:    Induction: Intravenous  Airway Management Planned: LMA  Additional Equipment:   Intra-op Plan:   Post-operative Plan:   Informed Consent: I have reviewed the patients History and Physical, chart, labs and discussed the procedure including the risks, benefits and alternatives for the proposed anesthesia with the patient or authorized representative who has indicated his/her understanding and acceptance.     Plan Discussed with: CRNA  Anesthesia Plan Comments:         Anesthesia Quick Evaluation

## 2016-03-03 NOTE — Anesthesia Procedure Notes (Signed)
Procedure Name: LMA Insertion Date/Time: 03/03/2016 8:40 AM Performed by: Leiland Mihelich D Pre-anesthesia Checklist: Patient identified, Emergency Drugs available, Suction available and Patient being monitored Patient Re-evaluated:Patient Re-evaluated prior to inductionOxygen Delivery Method: Circle System Utilized Preoxygenation: Pre-oxygenation with 100% oxygen Intubation Type: IV induction Ventilation: Mask ventilation without difficulty LMA: LMA inserted LMA Size: 4.0 Number of attempts: 1 Airway Equipment and Method: Bite block Placement Confirmation: positive ETCO2 Tube secured with: Tape Dental Injury: Teeth and Oropharynx as per pre-operative assessment

## 2016-03-03 NOTE — Op Note (Signed)
03/03/2016  8:34 AM  PATIENT:  Theresa Kramer  75 y.o. 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 Grandville Silos, MD  PHYSICIAN ASSISTANT: Morley Kos, OPA-C  ANESTHESIA:  general  SPECIMENS:  None  DRAINS:   None  EBL:  less than 50 mL  PREOPERATIVE INDICATIONS:  [name] is a  75 y.o. female with a painful, enlarging mass associated with the digital extensor tendons at the level of the wrist.  The risks benefits and alternatives were discussed with the patient preoperatively including but not limited to the risks of infection, bleeding, nerve injury, cardiopulmonary complications, the need for revision surgery, among others, and the patient verbalized understanding and consented to proceed.  OPERATIVE IMPLANTS: none  OPERATIVE PROCEDURE:  After receiving prophylactic antibiotics, the patient was escorted to the operative theatre and placed in a supine position.  General anesthesia was administered.  A surgical "time-out" was performed during which the planned procedure, proposed operative site, and the correct patient identity were compared to the operative consent and agreement confirmed by the circulating nurse according to current facility policy.  Following application of a tourniquet to the operative extremity, the exposed skin was prepped with Chloraprep and draped in the usual sterile fashion.  The limb was exsanguinated with an Esmarch bandage and the tourniquet inflated to approximately 123mmHg higher than systolic BP.  A 2 limb zigzag incision was created over the palpable mass.  The extensor retinaculum was split.  This was just the distal aspect of it over the carpus.  The enlargement of the  EIP tendon was located, and appeared to be an intratendinous ganglion.  The external surface of the tendon was then split longitudinally and the lesion was enucleated.  Longitudinally, all the tendon fibers appeared to be intact and this was maintained.  Once the lesion had been excised and the remainder of the internal surface of the tendon debrided with a curette, attention was shifted to excisionally debriding the tenosynovium of the other fourth compartment tendons in the surgical field as some degree of proliferative tenosynovitis was present.  The wound was irrigated.  4-0 Vicryl Rapide sutures 2 was placed into the longitudinally split and opened EIP tendon to loosely re-tubularized the tendon to help prevent any proximal triggering.  The tourniquet was released.  Half percent Marcaine with epinephrine was instilled in the skin edges to assist with postoperative pain control and hemostasis.  The skin was closed with 4-0 Vicryl Rapide running horizontal mattress suture.  A short arm splint dressing was applied with a volar plaster component and she was awakened and taken to the recovery room in stable condition, breathing spontaneously  DISPOSITION: She'll be discharged home today with typical instructions, returning in 10-15 days.

## 2016-03-03 NOTE — Anesthesia Postprocedure Evaluation (Signed)
Anesthesia Post Note  Patient: Theresa Kramer  Procedure(s) Performed: Procedure(s) (LRB): EXCISION OF LEFT  DORSAL MASS OF EXTENSOR TENDON (Left)  Patient location during evaluation: PACU Anesthesia Type: General Level of consciousness: awake and alert Pain management: pain level controlled Vital Signs Assessment: post-procedure vital signs reviewed and stable Respiratory status: spontaneous breathing, nonlabored ventilation and respiratory function stable Cardiovascular status: blood pressure returned to baseline and stable Postop Assessment: no signs of nausea or vomiting Anesthetic complications: no    Last Vitals:  Filed Vitals:   03/03/16 1000 03/03/16 1022  BP: 131/46 134/59  Pulse: 78 78  Temp:  36.6 C  Resp: 13 16    Last Pain:  Filed Vitals:   03/03/16 1025  PainSc: 0-No pain                 [name]

## 2016-03-03 NOTE — Interval H&P Note (Signed)
History and Physical Interval Note:  03/03/2016 8:33 AM  Theresa Kramer  has presented today for surgery, with the diagnosis of LEFT  DORSAL MASS OF EXTENSOR TENDONS R22.32  The various methods of treatment have been discussed with the patient and family. After consideration of risks, benefits and other options for treatment, the patient has consented to  Procedure(s): EXCISION OF LEFT  DORSAL MASS OF EXTENSOR TENDONS (Left) as a surgical intervention .  The patient's history has been reviewed, patient examined, no change in status, stable for surgery.  I have reviewed the patient's chart and labs.  Questions were answered to the patient's satisfaction.     Alyson Ki A.

## 2016-03-03 NOTE — Discharge Instructions (Signed)
Discharge Instructions   You have a dressing with a plaster splint incorporated in it. Move your fingers as much as possible, making a full fist and fully opening the fist. Elevate your hand to reduce pain & swelling of the digits.  Ice over the operative site may be helpful to reduce pain & swelling.  DO NOT USE HEAT. Pain medicine has been prescribed for you.  Use your medicine as needed over the first 48 hours, and then you can begin to taper your use.  You may use Tylenol in place of your prescribed pain medication, but not IN ADDITION to it. Leave the dressing in place until you return to our office.  You may shower, but keep the bandage clean & dry.  You may drive a car when you are off of prescription pain medications and can safely control your vehicle with both hands. Please call our office to schedule a follow up for 10-15 days from date of surgery.   Please call [phone] during normal business hours or [phone] after hours for any problems. Including the following:  - excessive redness of the incisions - drainage for more than 4 days - fever of more than 101.5 F  *Please note that pain medications will not be refilled after hours or on weekends.  Post Anesthesia Home Care Instructions  Activity: Get plenty of rest for the remainder of the day. A responsible adult should stay with you for 24 hours following the procedure.  For the next 24 hours, DO NOT: -Drive a car -Paediatric nurse -Drink alcoholic beverages -Take any medication unless instructed by your physician -Make any legal decisions or sign important papers.  Meals: Start with liquid foods such as gelatin or soup. Progress to regular foods as tolerated. Avoid greasy, spicy, heavy foods. If nausea and/or vomiting occur, drink only clear liquids until the nausea and/or vomiting subsides. Call your physician if vomiting continues.  Special Instructions/Symptoms: Your throat may feel dry or sore from the  anesthesia or the breathing tube placed in your throat during surgery. If this causes discomfort, gargle with warm salt water. The discomfort should disappear within 24 hours.  If you had a scopolamine patch placed behind your ear for the management of post- operative nausea and/or vomiting:  1. The medication in the patch is effective for 72 hours, after which it should be removed.  Wrap patch in a tissue and discard in the trash. Wash hands thoroughly with soap and water. 2. You may remove the patch earlier than 72 hours if you experience unpleasant side effects which may include dry mouth, dizziness or visual disturbances. 3. Avoid touching the patch. Wash your hands with soap and water after contact with the patch.

## 2016-03-04 ENCOUNTER — Encounter (HOSPITAL_BASED_OUTPATIENT_CLINIC_OR_DEPARTMENT_OTHER): Payer: Self-pay | Admitting: Orthopedic Surgery

## 2016-03-12 DIAGNOSIS — M25532 Pain in left wrist: Secondary | ICD-10-CM | POA: Diagnosis not present

## 2016-04-01 DIAGNOSIS — S46912A Strain of unspecified muscle, fascia and tendon at shoulder and upper arm level, left arm, initial encounter: Secondary | ICD-10-CM | POA: Diagnosis not present

## 2016-04-09 DIAGNOSIS — M542 Cervicalgia: Secondary | ICD-10-CM | POA: Diagnosis not present

## 2016-04-15 DIAGNOSIS — R2232 Localized swelling, mass and lump, left upper limb: Secondary | ICD-10-CM | POA: Diagnosis not present

## 2016-05-07 DIAGNOSIS — I10 Essential (primary) hypertension: Secondary | ICD-10-CM | POA: Diagnosis not present

## 2016-05-07 DIAGNOSIS — E119 Type 2 diabetes mellitus without complications: Secondary | ICD-10-CM | POA: Diagnosis not present

## 2016-05-07 DIAGNOSIS — K219 Gastro-esophageal reflux disease without esophagitis: Secondary | ICD-10-CM | POA: Diagnosis not present

## 2016-05-07 DIAGNOSIS — E785 Hyperlipidemia, unspecified: Secondary | ICD-10-CM | POA: Diagnosis not present

## 2016-05-07 DIAGNOSIS — Z79899 Other long term (current) drug therapy: Secondary | ICD-10-CM | POA: Diagnosis not present

## 2016-05-13 DIAGNOSIS — Z23 Encounter for immunization: Secondary | ICD-10-CM | POA: Diagnosis not present

## 2016-05-13 DIAGNOSIS — I10 Essential (primary) hypertension: Secondary | ICD-10-CM | POA: Diagnosis not present

## 2016-05-13 DIAGNOSIS — E1129 Type 2 diabetes mellitus with other diabetic kidney complication: Secondary | ICD-10-CM | POA: Diagnosis not present

## 2016-05-28 DIAGNOSIS — L570 Actinic keratosis: Secondary | ICD-10-CM | POA: Diagnosis not present

## 2016-05-28 DIAGNOSIS — L821 Other seborrheic keratosis: Secondary | ICD-10-CM | POA: Diagnosis not present

## 2016-05-28 DIAGNOSIS — D239 Other benign neoplasm of skin, unspecified: Secondary | ICD-10-CM | POA: Diagnosis not present

## 2016-07-24 DIAGNOSIS — M659 Synovitis and tenosynovitis, unspecified: Secondary | ICD-10-CM | POA: Diagnosis not present

## 2016-07-29 ENCOUNTER — Emergency Department (HOSPITAL_BASED_OUTPATIENT_CLINIC_OR_DEPARTMENT_OTHER): Payer: Medicare Other

## 2016-07-29 ENCOUNTER — Other Ambulatory Visit: Payer: Self-pay | Admitting: Emergency Medicine

## 2016-07-29 ENCOUNTER — Encounter (HOSPITAL_BASED_OUTPATIENT_CLINIC_OR_DEPARTMENT_OTHER): Payer: Self-pay | Admitting: *Deleted

## 2016-07-29 ENCOUNTER — Inpatient Hospital Stay (HOSPITAL_COMMUNITY)
Admission: EM | Admit: 2016-07-29 | Discharge: 2016-08-01 | DRG: 064 | Disposition: A | Discharge status: Home Health | Payer: Medicare Other | Attending: Internal Medicine | Admitting: Internal Medicine

## 2016-07-29 DIAGNOSIS — G9340 Encephalopathy, unspecified: Secondary | ICD-10-CM | POA: Diagnosis not present

## 2016-07-29 DIAGNOSIS — Z7982 Long term (current) use of aspirin: Secondary | ICD-10-CM

## 2016-07-29 DIAGNOSIS — K219 Gastro-esophageal reflux disease without esophagitis: Secondary | ICD-10-CM | POA: Diagnosis present

## 2016-07-29 DIAGNOSIS — D649 Anemia, unspecified: Secondary | ICD-10-CM | POA: Diagnosis present

## 2016-07-29 DIAGNOSIS — I639 Cerebral infarction, unspecified: Secondary | ICD-10-CM | POA: Diagnosis not present

## 2016-07-29 DIAGNOSIS — R4182 Altered mental status, unspecified: Secondary | ICD-10-CM

## 2016-07-29 DIAGNOSIS — E871 Hypo-osmolality and hyponatremia: Secondary | ICD-10-CM | POA: Diagnosis not present

## 2016-07-29 DIAGNOSIS — Z23 Encounter for immunization: Secondary | ICD-10-CM

## 2016-07-29 DIAGNOSIS — G3184 Mild cognitive impairment, so stated: Secondary | ICD-10-CM | POA: Diagnosis present

## 2016-07-29 DIAGNOSIS — M1990 Unspecified osteoarthritis, unspecified site: Secondary | ICD-10-CM | POA: Diagnosis not present

## 2016-07-29 DIAGNOSIS — Z96641 Presence of right artificial hip joint: Secondary | ICD-10-CM | POA: Diagnosis present

## 2016-07-29 DIAGNOSIS — E860 Dehydration: Secondary | ICD-10-CM | POA: Diagnosis present

## 2016-07-29 DIAGNOSIS — N390 Urinary tract infection, site not specified: Secondary | ICD-10-CM | POA: Diagnosis not present

## 2016-07-29 DIAGNOSIS — R3129 Other microscopic hematuria: Secondary | ICD-10-CM | POA: Diagnosis present

## 2016-07-29 DIAGNOSIS — F329 Major depressive disorder, single episode, unspecified: Secondary | ICD-10-CM | POA: Diagnosis present

## 2016-07-29 DIAGNOSIS — M5481 Occipital neuralgia: Secondary | ICD-10-CM | POA: Diagnosis not present

## 2016-07-29 DIAGNOSIS — Z87891 Personal history of nicotine dependence: Secondary | ICD-10-CM | POA: Diagnosis not present

## 2016-07-29 DIAGNOSIS — G459 Transient cerebral ischemic attack, unspecified: Secondary | ICD-10-CM

## 2016-07-29 DIAGNOSIS — Z794 Long term (current) use of insulin: Secondary | ICD-10-CM

## 2016-07-29 DIAGNOSIS — I10 Essential (primary) hypertension: Secondary | ICD-10-CM | POA: Diagnosis present

## 2016-07-29 DIAGNOSIS — E538 Deficiency of other specified B group vitamins: Secondary | ICD-10-CM | POA: Diagnosis not present

## 2016-07-29 DIAGNOSIS — G2581 Restless legs syndrome: Secondary | ICD-10-CM | POA: Diagnosis not present

## 2016-07-29 DIAGNOSIS — E1165 Type 2 diabetes mellitus with hyperglycemia: Secondary | ICD-10-CM | POA: Diagnosis present

## 2016-07-29 DIAGNOSIS — R4689 Other symptoms and signs involving appearance and behavior: Secondary | ICD-10-CM | POA: Diagnosis present

## 2016-07-29 DIAGNOSIS — I63521 Cerebral infarction due to unspecified occlusion or stenosis of right anterior cerebral artery: Principal | ICD-10-CM | POA: Diagnosis present

## 2016-07-29 DIAGNOSIS — E785 Hyperlipidemia, unspecified: Secondary | ICD-10-CM

## 2016-07-29 DIAGNOSIS — E119 Type 2 diabetes mellitus without complications: Secondary | ICD-10-CM

## 2016-07-29 LAB — URINE MICROSCOPIC-ADD ON

## 2016-07-29 LAB — TROPONIN I

## 2016-07-29 LAB — URINALYSIS, ROUTINE W REFLEX MICROSCOPIC
Bilirubin Urine: NEGATIVE
Glucose, UA: NEGATIVE mg/dL
KETONES UR: NEGATIVE mg/dL
Nitrite: POSITIVE — AB
PROTEIN: NEGATIVE mg/dL
Specific Gravity, Urine: 1.018 (ref 1.005–1.030)
pH: 5 (ref 5.0–8.0)

## 2016-07-29 LAB — APTT: aPTT: 26 seconds (ref 24–36)

## 2016-07-29 LAB — CBC WITH DIFFERENTIAL/PLATELET
BASOS ABS: 0 10*3/uL (ref 0.0–0.1)
BASOS PCT: 0 %
EOS ABS: 0 10*3/uL (ref 0.0–0.7)
Eosinophils Relative: 0 %
HCT: 32.9 % — ABNORMAL LOW (ref 36.0–46.0)
Hemoglobin: 10.1 g/dL — ABNORMAL LOW (ref 12.0–15.0)
LYMPHS PCT: 67 %
Lymphs Abs: 18.1 10*3/uL — ABNORMAL HIGH (ref 0.7–4.0)
MCH: 25.7 pg — ABNORMAL LOW (ref 26.0–34.0)
MCHC: 30.7 g/dL (ref 30.0–36.0)
MCV: 83.7 fL (ref 78.0–100.0)
MONO ABS: 2.2 10*3/uL — AB (ref 0.1–1.0)
Monocytes Relative: 8 %
NEUTROS PCT: 25 %
Neutro Abs: 6.8 10*3/uL (ref 1.7–7.7)
PLATELETS: 392 10*3/uL (ref 150–400)
RBC: 3.93 MIL/uL (ref 3.87–5.11)
RDW: 15.2 % (ref 11.5–15.5)
WBC: 27.1 10*3/uL — AB (ref 4.0–10.5)

## 2016-07-29 LAB — CBG MONITORING, ED
GLUCOSE-CAPILLARY: 85 mg/dL (ref 65–99)
Glucose-Capillary: 67 mg/dL (ref 65–99)

## 2016-07-29 LAB — COMPREHENSIVE METABOLIC PANEL
ALT: 21 U/L (ref 14–54)
AST: 28 U/L (ref 15–41)
Albumin: 3.7 g/dL (ref 3.5–5.0)
Alkaline Phosphatase: 54 U/L (ref 38–126)
Anion gap: 13 (ref 5–15)
BUN: 36 mg/dL — ABNORMAL HIGH (ref 6–20)
CHLORIDE: 105 mmol/L (ref 101–111)
CO2: 21 mmol/L — ABNORMAL LOW (ref 22–32)
CREATININE: 0.77 mg/dL (ref 0.44–1.00)
Calcium: 9.6 mg/dL (ref 8.9–10.3)
Glucose, Bld: 86 mg/dL (ref 65–99)
POTASSIUM: 3.6 mmol/L (ref 3.5–5.1)
Sodium: 139 mmol/L (ref 135–145)
TOTAL PROTEIN: 7.3 g/dL (ref 6.5–8.1)
Total Bilirubin: 0.4 mg/dL (ref 0.3–1.2)

## 2016-07-29 LAB — PROTIME-INR
INR: 0.98
Prothrombin Time: 13 seconds (ref 11.4–15.2)

## 2016-07-29 LAB — RAPID URINE DRUG SCREEN, HOSP PERFORMED
Amphetamines: NOT DETECTED
BARBITURATES: NOT DETECTED
BENZODIAZEPINES: NOT DETECTED
Cocaine: NOT DETECTED
Opiates: NOT DETECTED
Tetrahydrocannabinol: NOT DETECTED

## 2016-07-29 LAB — GLUCOSE, CAPILLARY: GLUCOSE-CAPILLARY: 131 mg/dL — AB (ref 65–99)

## 2016-07-29 LAB — ETHANOL

## 2016-07-29 MED ORDER — INFLUENZA VAC SPLIT QUAD 0.5 ML IM SUSY
0.5000 mL | PREFILLED_SYRINGE | INTRAMUSCULAR | Status: AC
  Administered 2016-07-30: 0.5 mL via INTRAMUSCULAR

## 2016-07-29 NOTE — Progress Notes (Signed)
75 yo F with DM, HTN, hyperlipidemia presents tonight with an episode of transient altered mental status (couldn't buckle seat belt, put purse around neck) that daughter observed lasting 20-30 minutes.  Dr. Regenia Skeeter discussed with Neuro who suspects TIA vs post-ictal state from unwitnessed seizure.  Vitals WNL.  WBC up, but has been on steroids last few days for inflamed ganglion cyst.  CT head normal.  To tele OBS for Neuro consult and MRI brain and probably limited TIA workup.

## 2016-07-29 NOTE — ED Notes (Signed)
Attempted to call report Balinda Quails RN unavailable at this time.

## 2016-07-29 NOTE — Progress Notes (Signed)
Received report from [facility] ED RN, Mortimer Fries.

## 2016-07-29 NOTE — ED Notes (Signed)
MD at bedside. 

## 2016-07-29 NOTE — ED Provider Notes (Signed)
[facility] Provider Note   CSN: [id number] Arrival date & time: 07/29/16  1718     History   Chief Complaint Chief Complaint  Patient presents with  . Altered Mental Status    HPI Theresa Kramer is a 75 y.o. female.  HPI  75 year old female with a history of diabetes, hypertension, and hyperlipidemia presents with acute and transient altered mental status. History is taken from the daughter. Patient was being picked up by the daughter to go to a movie and the daughter noticed that she was not acting right. She had been acting normal that morning. Patient was awake but seemed disoriented. She would have staring spells and appeared confused on how to do simple tasks such as put a purse around her neck and arm or buckle her seatbelt. When she was having trouble she would just stare at the daughter. Lasted no more than 30 minutes. Patient states she was awake and remembers this and it feels like she just couldn't do things correctly. She did not have a headache or chest pain. The daughter noted that it looked like she was holding her neck like it was stiff. The patient states everything just felt "slow". During this time the daughter checked her glucose was 126. Patient has been on prednisone for the last few days for inflammation, originally was hyperglycemic but more recently has had glucoses in the low 100s. Gave her a candy bar to eat  because she was worried her blood sugar might be low. There was no slurred speech, facial droop or focal weakness.  Past Medical History:  Diagnosis Date  . Arthritis   . B12 deficiency   . Bronchitis    hx of  . Complication of anesthesia   . Depression    "not taking medication"  . Diabetes mellitus   . GERD (gastroesophageal reflux disease)   . H/O hiatal hernia   . Hyperlipidemia   . Hypertension   . Noncompliance with medication regimen   . Pinched cervical nerve root   . PONV (postoperative nausea and vomiting)   . Urinary  tract infection    hx of    Patient Active Problem List   Diagnosis Date Noted  . Schatzki's ring   . Cervical herniated disc 03/15/2012  . GERD 04/29/2010  . CHEST PAIN, ATYPICAL 04/29/2010  . DYSPHAGIA UNSPECIFIED 04/29/2010    Past Surgical History:  Procedure Laterality Date  . ABDOMINAL HYSTERECTOMY    . ANTERIOR CERVICAL DECOMP/DISCECTOMY FUSION  03/15/2012   Procedure: ANTERIOR CERVICAL DECOMPRESSION/DISCECTOMY FUSION 2 LEVELS;  Surgeon: Ophelia Charter, MD;  Location: [facility];  Service: Neurosurgery;  Laterality: N/A;  Cervical four-five Cervical five six anterior cervical decompression with fusion interbody prothesis plating and bonegraft  . APPENDECTOMY    . BONE CYST EXCISION     from lower back and foot  . CHOLECYSTECTOMY    . COLONOSCOPY  2011   Dr. Gala Romney: prep compromised exam. diverticulosis, hemorrhoids. next tcs 2021  . DILATION AND CURETTAGE OF UTERUS    . 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 Multiple distal rings dilated and disrupted as described above. Small hiatal hernia.   Marland Kitchen EXCISION METACARPAL MASS Left 03/03/2016   Procedure: EXCISION OF LEFT  DORSAL MASS OF EXTENSOR TENDON;  Surgeon: Milly Jakob, MD;  Location: [place] [place] [facility];  Service: Orthopedics;  Laterality: Left;  . EYE SURGERY     bilateral cataract surgery,   .  HIP ARTHROPLASTY     right  . JOINT REPLACEMENT    . KNEE SURGERY     torn cartilage repair  . MALONEY DILATION N/A 09/19/2014   Procedure: Venia Minks DILATION;  Surgeon: Daneil Dolin, MD;  [facility];  Service: Endoscopy;  Laterality: N/A;  . ORTHOPEDIC SURGERY    . ROTATOR CUFF REPAIR     "multiple in both shoulders" and total shoulder replacement in left arm  . SAVORY DILATION N/A 09/19/2014   Procedure: SAVORY DILATION;  Surgeon: Daneil Dolin, MD;  Location: [facility];  Service: Endoscopy;  Laterality: N/A;  .  TONSILLECTOMY      OB History    No data available       Home Medications    Prior to Admission medications   Medication Sig Start Date End Date Taking? Authorizing Provider  acetaminophen (TYLENOL) 650 MG CR tablet Take 1,300 mg by mouth 2 (two) times daily.   Yes Historical Provider, MD  amLODipine (NORVASC) 5 MG tablet Take 5 mg by mouth daily.   Yes Historical Provider, MD  aspirin 81 MG tablet Take 81 mg by mouth daily.   Yes Historical Provider, MD  benzonatate (TESSALON) 200 MG capsule Take 200 mg by mouth 3 (three) times daily as needed for cough.   Yes Historical Provider, MD  citalopram (CELEXA) 20 MG tablet Take 20 mg by mouth daily.     Yes Historical Provider, MD  clotrimazole (LOTRIMIN) 1 % cream Apply 1 application topically 2 (two) times daily as needed. For rash    Yes Historical Provider, MD  cyanocobalamin (,VITAMIN B-12,) 1000 MCG/ML injection Inject 1,000 mcg into the muscle every 30 (thirty) days. Usually on the 1st    Yes Historical Provider, MD  gemfibrozil (LOPID) 600 MG tablet Take 600 mg by mouth 2 (two) times daily before a meal.     Yes Historical Provider, MD  glipiZIDE (GLUCOTROL XL) 5 MG 24 hr tablet Take 5 mg by mouth daily.     Yes Historical Provider, MD  HYDROcodone-homatropine (HYCODAN) 5-1.5 MG/5ML syrup Take 5 mLs by mouth every 4 (four) hours as needed for cough.   Yes Historical Provider, MD  insulin aspart (NOVOLOG FLEXPEN) 100 UNIT/ML injection Inject 12 Units into the skin 3 (three) times daily before meals.    Yes Historical Provider, MD  insulin glargine (LANTUS) 100 UNIT/ML injection Inject 70 Units into the skin at bedtime.    Yes Historical Provider, MD  losartan (COZAAR) 100 MG tablet Take 100 mg by mouth daily.   Yes Historical Provider, MD  metFORMIN (GLUCOPHAGE) 1000 MG tablet Take 1,000 mg by mouth 2 (two) times daily with a meal.     Yes Historical Provider, MD  oxybutynin (DITROPAN-XL) 10 MG 24 hr tablet Take 10 mg by mouth daily.     Yes Historical Provider, MD  pantoprazole (PROTONIX) 40 MG tablet Take 40 mg by mouth daily.   Yes Historical Provider, MD  pramipexole (MIRAPEX) 0.25 MG tablet Take 0.25 mg by mouth daily.     Yes Historical Provider, MD  pravastatin (PRAVACHOL) 40 MG tablet Take 40 mg by mouth daily.     Yes Historical Provider, MD  ranitidine (ZANTAC) 150 MG tablet Take 150 mg by mouth at bedtime.   Yes Historical Provider, MD    Family History Family History  Problem Relation Age of Onset  . Cirrhosis Brother     etoh  . Colon cancer Neg Hx  Social History Social History  Substance Use Topics  . Smoking status: Former Research scientist (life sciences)  . Smokeless tobacco: Not on file     Comment: stopped 25 years ago  . Alcohol use No     Allergies   Meloxicam   Review of Systems Review of Systems  Cardiovascular: Negative for leg swelling.  Gastrointestinal: Negative for vomiting.  Neurological: Negative for seizures, weakness, numbness and headaches.  Psychiatric/Behavioral: Positive for confusion.  All other systems reviewed and are negative.    Physical Exam Updated Vital Signs BP 162/71 (BP Location: Left Arm)   Pulse 80   Temp 97.8 F (36.6 C) (Oral)   Resp 20   Ht 5\' 3"  (1.6 m)   Wt 230 lb (104.3 kg)   SpO2 98%   BMI 40.74 kg/m   Physical Exam  Constitutional: She is oriented to person, place, and time. She appears well-developed and well-nourished.  HENT:  Head: Normocephalic and atraumatic.  Right Ear: External ear normal.  Left Ear: External ear normal.  Nose: Nose normal.  Eyes: EOM are normal. Pupils are equal, round, and reactive to light. Right eye exhibits no discharge. Left eye exhibits no discharge.  Neck: Neck supple.  Cardiovascular: Normal rate, regular rhythm and normal heart sounds.   Pulmonary/Chest: Effort normal and breath sounds normal.  Abdominal: Soft. There is no tenderness.  Neurological: She is alert and oriented to person, place, and time.  CN 3-12 grossly  intact. 5/5 strength in all 4 extremities. Grossly normal sensation. Normal finger to nose.   Skin: Skin is warm and dry.  Nursing note and vitals reviewed.    ED Treatments / Results  Labs (all labs ordered are listed, but only abnormal results are displayed) Labs Reviewed  COMPREHENSIVE METABOLIC PANEL - Abnormal; Notable for the following:       Result Value   CO2 21 (*)    BUN 36 (*)    All other components within normal limits  URINALYSIS, ROUTINE W REFLEX MICROSCOPIC (NOT AT [facility]) - Abnormal; Notable for the following:    APPearance CLOUDY (*)    Hgb urine dipstick LARGE (*)    Nitrite POSITIVE (*)    Leukocytes, UA MODERATE (*)    All other components within normal limits  URINE MICROSCOPIC-ADD ON - Abnormal; Notable for the following:    Squamous Epithelial / LPF 0-5 (*)    Bacteria, UA MANY (*)    All other components within normal limits  GLUCOSE, CAPILLARY - Abnormal; Notable for the following:    Glucose-Capillary 131 (*)    All other components within normal limits  LIPID PANEL - Abnormal; Notable for the following:    Cholesterol 202 (*)    Triglycerides 259 (*)    HDL 28 (*)    VLDL 52 (*)    LDL Cholesterol 122 (*)    All other components within normal limits  CBC - Abnormal; Notable for the following:    WBC 27.3 (*)    Hemoglobin 10.9 (*)    HCT 35.6 (*)    MCH 25.9 (*)    Platelets 407 (*)    All other components within normal limits  URINE CULTURE  ETHANOL  PROTIME-INR  APTT  URINE RAPID DRUG SCREEN, HOSP PERFORMED  TROPONIN I  GLUCOSE, CAPILLARY  HEMOGLOBIN A1C  CBG MONITORING, ED  CBG MONITORING, ED    EKG  EKG Interpretation  Date/Time:  Tuesday July 29 2016 17:41:59 EDT Ventricular Rate:  72 PR Interval:  QRS Duration: 93 QT Interval:  414 QTC Calculation: [id number] R Axis:   33 Text Interpretation:  Sinus rhythm Abnormal R-wave progression, early transition no significant change since April 2017 Confirmed by Regenia Skeeter MD, Jodiann Ognibene  [phone]) on 07/29/2016 5:50:45 PM       Radiology No results found.  Procedures Procedures (including critical care time)  Medications Ordered in ED Medications - No data to display   Initial Impression / Assessment and Plan / ED Course  I have reviewed the triage vital signs and the nursing notes.  Pertinent labs & imaging results that were available during my care of the patient were reviewed by me and considered in my medical decision making (see chart for details).  Clinical Course  Comment By Time  Will do CVA workup, especially given her risk factors. Plan to consult neuro. Currently at baseline and normal neuro exam. Sherwood Gambler, MD 09/26 1748  D/w Dr Shon Hale. Agrees with need for TIA/Sz workup in hospital. Consult hospitalist Sherwood Gambler, MD 09/26 1919    Dr. Loleta Books to admit to tele obs. She remains asymptomatic and stable.   Final Clinical Impressions(s) / ED Diagnoses   Final diagnoses:  TIA (transient ischemic attack)  TIA (transient ischemic attack)    New Prescriptions New Prescriptions   No medications on file     Sherwood Gambler, MD 07/30/16 1139

## 2016-07-29 NOTE — ED Notes (Signed)
Report given to [facility] with ETA 20 minutes.

## 2016-07-29 NOTE — ED Notes (Signed)
Pt has left via [facility] condition stable

## 2016-07-29 NOTE — Consult Note (Signed)
Neurology Consultation Reason for Consult: Transient AMS Referring Physician: Casper Harrison  CC: Transient AMS  History is obtained from: Patient, daughter  HPI: Theresa Kramer is a 75 y.o. female who had a transient episode of abnormal behavior earlier. Her daughter states that she stood up and was walking, holding her arms outstretched and seemed to not know what to do with him as she walked. She then got into the car and tried to buckle her seatbelt, but was unable to  operate the buckle. When she tried to exit the car, she put one leg out but then appeared to not know what to do to continue exiting the vehicle. During this, she was able to answer questions, though seemed possibly slightly slow but otherwise appropriate.  Due to these symptoms, she presented to the [facility] emergency department at which time her symptoms completely resolved.    LKW: 4:30 PM tpa given?: no, resolve symptoms    ROS: A 14 point ROS was performed and is negative except as noted in the HPI.   Past Medical History:  Diagnosis Date  . Arthritis   . B12 deficiency   . Bronchitis    hx of  . Complication of anesthesia   . Depression    "not taking medication"  . Diabetes mellitus   . GERD (gastroesophageal reflux disease)   . H/O hiatal hernia   . Hyperlipidemia   . Hypertension   . Noncompliance with medication regimen   . Pinched cervical nerve root   . PONV (postoperative nausea and vomiting)   . Urinary tract infection    hx of     Family History  Problem Relation Age of Onset  . Cirrhosis Brother     etoh  . Colon cancer Neg Hx      Social History:  reports that she has quit smoking. She does not have any smokeless tobacco history on file. She reports that she does not drink alcohol or use drugs.   Exam: Current vital signs: BP 145/62 (BP Location: Right Arm)   Pulse 72   Temp 97.8 F (36.6 C)   Resp 25   Ht 5\' 3"  (1.6 m)   Wt 104.3 kg (230 lb)   SpO2 96%    BMI 40.74 kg/m  Vital signs in last 24 hours: Temp:  [97.8 F (36.6 C)] 97.8 F (36.6 C) (09/26 1835) Pulse Rate:  [65-80] 72 (09/26 2159) Resp:  [13-25] 25 (09/26 2159) BP: (145-171)/(62-83) 145/62 (09/26 2007) SpO2:  [94 %-98 %] 96 % (09/26 2159) Weight:  [104.3 kg (230 lb)] 104.3 kg (230 lb) (09/26 1727)   Physical Exam  Constitutional: Appears well-developed and well-nourished.  Psych: Affect appropriate to situation Eyes: No scleral injection HENT: No OP obstrucion Head: Normocephalic.  Cardiovascular: Normal rate and regular rhythm.  Respiratory: Effort normal and breath sounds normal to anterior ascultation GI: Soft.  No distension. There is no tenderness.  Skin: WDI  Neuro: Mental Status: Patient is awake, alert, Interactive and appropriate Patient is able to give a clear and coherent history. No signs of aphasia or neglect Cranial Nerves: II: Visual Fields are full. Pupils are equal, round, and reactive to light.  III,IV, VI: EOMI without ptosis or diploplia.  V: Facial sensation is symmetric to temperature VII: Facial movement is symmetric.  VIII: hearing is intact to voice X: Uvula elevates symmetrically XI: Shoulder shrug is symmetric. XII: tongue is midline without atrophy or fasciculations.  Motor: Tone is normal. Bulk  is normal. 5/5 strength was present in all four extremities.  Sensory: Sensation is symmetric to light touch and temperature in the arms and legs. Cerebellar: FNF intact bilaterally      I have reviewed labs in epic and the results pertinent to this consultation are: +leukocytosis cmp - relatively unremarkable  I have reviewed the images obtained: CT Head - negative.   Impression: 75 year old female with transient altered mental status. She does have a leukocytosis, and if there was some other physiological stressor then they could be concerning for possible mild delirium though her symptoms seem to have greatly improved. Given the  short duration, and the fact that it sounds very much like an apraxia, I think I would treat this as a transient focal neurological deficit, most likely due to TIA. One other possibility would be partial complex seizure and she will also need evaluation with an EEG.  Recommendations: 1. HgbA1c, fasting lipid panel 2. MRI, MRA  of the brain without contrast 3. Frequent neuro checks 4. Echocardiogram 5. Carotid dopplers 6. Prophylactic therapy-Antiplatelet med: Aspirin - dose 325mg  PO or 300mg  PR 7. Risk factor modification 8. Telemetry monitoring 9. PT consult, OT consult, Speech consult 10.EEG   Roland Rack, MD [facility] [phone]  If 7pm- 7am, please page neurology on call as listed in [place].

## 2016-07-29 NOTE — ED Triage Notes (Signed)
Pts daughter reports pt had altered mental status today that lasted about 20-30 minutes. CBG 126 at home.  States that pt was A/O x 4, denies pain.  States that pt had a 'blank look' and was slow to respond and move.  Pt A/O at this time, ambulatory, no distress or confusion noted.

## 2016-07-30 ENCOUNTER — Observation Stay (HOSPITAL_COMMUNITY): Payer: Medicare Other

## 2016-07-30 ENCOUNTER — Encounter (HOSPITAL_COMMUNITY): Payer: Self-pay

## 2016-07-30 DIAGNOSIS — I63521 Cerebral infarction due to unspecified occlusion or stenosis of right anterior cerebral artery: Secondary | ICD-10-CM | POA: Diagnosis not present

## 2016-07-30 DIAGNOSIS — R4689 Other symptoms and signs involving appearance and behavior: Secondary | ICD-10-CM | POA: Diagnosis present

## 2016-07-30 DIAGNOSIS — E119 Type 2 diabetes mellitus without complications: Secondary | ICD-10-CM | POA: Diagnosis not present

## 2016-07-30 DIAGNOSIS — M5481 Occipital neuralgia: Secondary | ICD-10-CM | POA: Diagnosis not present

## 2016-07-30 DIAGNOSIS — D72820 Lymphocytosis (symptomatic): Secondary | ICD-10-CM | POA: Diagnosis not present

## 2016-07-30 DIAGNOSIS — E871 Hypo-osmolality and hyponatremia: Secondary | ICD-10-CM | POA: Diagnosis present

## 2016-07-30 DIAGNOSIS — I6350 Cerebral infarction due to unspecified occlusion or stenosis of unspecified cerebral artery: Secondary | ICD-10-CM | POA: Diagnosis not present

## 2016-07-30 DIAGNOSIS — G9340 Encephalopathy, unspecified: Secondary | ICD-10-CM | POA: Diagnosis not present

## 2016-07-30 DIAGNOSIS — G459 Transient cerebral ischemic attack, unspecified: Secondary | ICD-10-CM | POA: Diagnosis not present

## 2016-07-30 DIAGNOSIS — I639 Cerebral infarction, unspecified: Secondary | ICD-10-CM | POA: Diagnosis not present

## 2016-07-30 DIAGNOSIS — Z87891 Personal history of nicotine dependence: Secondary | ICD-10-CM | POA: Diagnosis not present

## 2016-07-30 DIAGNOSIS — D649 Anemia, unspecified: Secondary | ICD-10-CM | POA: Diagnosis not present

## 2016-07-30 DIAGNOSIS — G3184 Mild cognitive impairment, so stated: Secondary | ICD-10-CM | POA: Diagnosis present

## 2016-07-30 DIAGNOSIS — N390 Urinary tract infection, site not specified: Secondary | ICD-10-CM | POA: Diagnosis present

## 2016-07-30 DIAGNOSIS — R3129 Other microscopic hematuria: Secondary | ICD-10-CM | POA: Diagnosis present

## 2016-07-30 DIAGNOSIS — I10 Essential (primary) hypertension: Secondary | ICD-10-CM | POA: Diagnosis not present

## 2016-07-30 DIAGNOSIS — E1165 Type 2 diabetes mellitus with hyperglycemia: Secondary | ICD-10-CM | POA: Diagnosis present

## 2016-07-30 DIAGNOSIS — Z23 Encounter for immunization: Secondary | ICD-10-CM | POA: Diagnosis not present

## 2016-07-30 DIAGNOSIS — K219 Gastro-esophageal reflux disease without esophagitis: Secondary | ICD-10-CM | POA: Diagnosis present

## 2016-07-30 DIAGNOSIS — Z96641 Presence of right artificial hip joint: Secondary | ICD-10-CM | POA: Diagnosis present

## 2016-07-30 DIAGNOSIS — M1990 Unspecified osteoarthritis, unspecified site: Secondary | ICD-10-CM

## 2016-07-30 DIAGNOSIS — Z794 Long term (current) use of insulin: Secondary | ICD-10-CM

## 2016-07-30 DIAGNOSIS — G2581 Restless legs syndrome: Secondary | ICD-10-CM | POA: Diagnosis present

## 2016-07-30 DIAGNOSIS — R6889 Other general symptoms and signs: Secondary | ICD-10-CM

## 2016-07-30 DIAGNOSIS — E785 Hyperlipidemia, unspecified: Secondary | ICD-10-CM | POA: Diagnosis not present

## 2016-07-30 DIAGNOSIS — E538 Deficiency of other specified B group vitamins: Secondary | ICD-10-CM | POA: Diagnosis present

## 2016-07-30 DIAGNOSIS — R4182 Altered mental status, unspecified: Secondary | ICD-10-CM | POA: Diagnosis present

## 2016-07-30 DIAGNOSIS — E860 Dehydration: Secondary | ICD-10-CM | POA: Diagnosis present

## 2016-07-30 DIAGNOSIS — Z7982 Long term (current) use of aspirin: Secondary | ICD-10-CM | POA: Diagnosis not present

## 2016-07-30 DIAGNOSIS — D72829 Elevated white blood cell count, unspecified: Secondary | ICD-10-CM | POA: Diagnosis not present

## 2016-07-30 DIAGNOSIS — F329 Major depressive disorder, single episode, unspecified: Secondary | ICD-10-CM | POA: Diagnosis present

## 2016-07-30 DIAGNOSIS — Z8639 Personal history of other endocrine, nutritional and metabolic disease: Secondary | ICD-10-CM | POA: Diagnosis not present

## 2016-07-30 LAB — CBC
HCT: 35.6 % — ABNORMAL LOW (ref 36.0–46.0)
Hemoglobin: 10.9 g/dL — ABNORMAL LOW (ref 12.0–15.0)
MCH: 25.9 pg — ABNORMAL LOW (ref 26.0–34.0)
MCHC: 30.6 g/dL (ref 30.0–36.0)
MCV: 84.6 fL (ref 78.0–100.0)
PLATELETS: 407 10*3/uL — AB (ref 150–400)
RBC: 4.21 MIL/uL (ref 3.87–5.11)
RDW: 15 % (ref 11.5–15.5)
WBC: 27.3 10*3/uL — AB (ref 4.0–10.5)

## 2016-07-30 LAB — LIPID PANEL
CHOLESTEROL: 202 mg/dL — AB (ref 0–200)
HDL: 28 mg/dL — ABNORMAL LOW (ref >40)
LDL Cholesterol: 122 mg/dL — ABNORMAL HIGH (ref 0–99)
TRIGLYCERIDES: 259 mg/dL — AB (ref <150)
Total CHOL/HDL Ratio: 7.2 RATIO
VLDL: 52 mg/dL — ABNORMAL HIGH (ref 0–40)

## 2016-07-30 LAB — PATHOLOGIST SMEAR REVIEW

## 2016-07-30 LAB — GLUCOSE, CAPILLARY
GLUCOSE-CAPILLARY: 68 mg/dL (ref 65–99)
GLUCOSE-CAPILLARY: 155 mg/dL — AB (ref 65–99)
Glucose-Capillary: 85 mg/dL (ref 65–99)
Glucose-Capillary: 271 mg/dL — ABNORMAL HIGH (ref 65–99)

## 2016-07-30 MED ORDER — CLOPIDOGREL BISULFATE 75 MG PO TABS
75.0000 mg | ORAL_TABLET | Freq: Every day | ORAL | Status: DC
Start: 2016-07-31 — End: 2016-08-01
  Administered 2016-07-31 – 2016-08-01 (×2): 75 mg via ORAL
  Filled 2016-07-30 (×2): qty 1

## 2016-07-30 MED ORDER — INSULIN GLARGINE 100 UNIT/ML ~~LOC~~ SOLN
75.0000 [IU] | Freq: Every day | SUBCUTANEOUS | Status: DC
  Administered 2016-07-30 – 2016-07-31 (×3): 75 [IU] via SUBCUTANEOUS
  Filled 2016-07-30 (×4): qty 0.75

## 2016-07-30 MED ORDER — INSULIN ASPART 100 UNIT/ML ~~LOC~~ SOLN
0.0000 [IU] | Freq: Three times a day (TID) | SUBCUTANEOUS | Status: DC
  Administered 2016-07-30: 3 [IU] via SUBCUTANEOUS
  Administered 2016-07-31 (×3): 5 [IU] via SUBCUTANEOUS
  Administered 2016-08-01: 3 [IU] via SUBCUTANEOUS

## 2016-07-30 MED ORDER — CITALOPRAM HYDROBROMIDE 20 MG PO TABS
20.0000 mg | ORAL_TABLET | Freq: Every day | ORAL | Status: DC
  Administered 2016-07-30 – 2016-08-01 (×3): 20 mg via ORAL
  Filled 2016-07-30 (×3): qty 1

## 2016-07-30 MED ORDER — PANTOPRAZOLE SODIUM 40 MG PO TBEC
40.0000 mg | DELAYED_RELEASE_TABLET | Freq: Every day | ORAL | Status: DC
  Administered 2016-07-30 – 2016-08-01 (×3): 40 mg via ORAL
  Filled 2016-07-30 (×3): qty 1

## 2016-07-30 MED ORDER — OXYBUTYNIN CHLORIDE ER 10 MG PO TB24
10.0000 mg | ORAL_TABLET | Freq: Every day | ORAL | Status: DC
  Administered 2016-07-30 – 2016-08-01 (×3): 10 mg via ORAL
  Filled 2016-07-30 (×3): qty 1

## 2016-07-30 MED ORDER — LOSARTAN POTASSIUM 50 MG PO TABS
100.0000 mg | ORAL_TABLET | Freq: Every day | ORAL | Status: DC
  Administered 2016-07-30 – 2016-08-01 (×3): 100 mg via ORAL
  Filled 2016-07-30 (×3): qty 2

## 2016-07-30 MED ORDER — PRAVASTATIN SODIUM 40 MG PO TABS
80.0000 mg | ORAL_TABLET | Freq: Every day | ORAL | Status: DC
  Administered 2016-07-31 – 2016-08-01 (×2): 80 mg via ORAL
  Filled 2016-07-30 (×2): qty 2

## 2016-07-30 MED ORDER — PRAVASTATIN SODIUM 40 MG PO TABS
40.0000 mg | ORAL_TABLET | Freq: Every day | ORAL | Status: DC
  Administered 2016-07-30: 40 mg via ORAL
  Filled 2016-07-30: qty 1

## 2016-07-30 MED ORDER — ENOXAPARIN SODIUM 40 MG/0.4ML ~~LOC~~ SOLN
40.0000 mg | SUBCUTANEOUS | Status: DC
  Administered 2016-07-30 – 2016-08-01 (×3): 40 mg via SUBCUTANEOUS
  Filled 2016-07-30 (×3): qty 0.4

## 2016-07-30 MED ORDER — STROKE: EARLY STAGES OF RECOVERY BOOK
Freq: Once | Status: AC
  Administered 2016-07-30: 02:00:00
  Filled 2016-07-30: qty 1

## 2016-07-30 MED ORDER — SODIUM CHLORIDE 0.9 % IV BOLUS (SEPSIS)
500.0000 mL | Freq: Once | INTRAVENOUS | Status: AC
  Administered 2016-07-30: 500 mL via INTRAVENOUS

## 2016-07-30 MED ORDER — ACETAMINOPHEN 500 MG PO TABS
1000.0000 mg | ORAL_TABLET | Freq: Two times a day (BID) | ORAL | Status: DC
Start: 2016-07-30 — End: 2016-08-01
  Administered 2016-07-30 – 2016-08-01 (×6): 1000 mg via ORAL
  Filled 2016-07-30 (×6): qty 2

## 2016-07-30 MED ORDER — FAMOTIDINE 20 MG PO TABS
20.0000 mg | ORAL_TABLET | Freq: Every day | ORAL | Status: DC
Start: 2016-07-30 — End: 2016-08-01
  Administered 2016-07-30 – 2016-08-01 (×3): 20 mg via ORAL
  Filled 2016-07-30 (×3): qty 1

## 2016-07-30 MED ORDER — AMLODIPINE BESYLATE 5 MG PO TABS
5.0000 mg | ORAL_TABLET | Freq: Every day | ORAL | Status: DC
Start: 2016-07-30 — End: 2016-08-01
  Administered 2016-07-30 – 2016-08-01 (×3): 5 mg via ORAL
  Filled 2016-07-30 (×3): qty 1

## 2016-07-30 MED ORDER — ASPIRIN 325 MG PO TABS
325.0000 mg | ORAL_TABLET | Freq: Every day | ORAL | Status: DC
  Administered 2016-07-30: 325 mg via ORAL
  Filled 2016-07-30: qty 1

## 2016-07-30 MED ORDER — ASPIRIN 300 MG RE SUPP
300.0000 mg | Freq: Every day | RECTAL | Status: DC
  Filled 2016-07-30: qty 1

## 2016-07-30 MED ORDER — PRAMIPEXOLE DIHYDROCHLORIDE 0.25 MG PO TABS
0.2500 mg | ORAL_TABLET | Freq: Every day | ORAL | Status: DC
  Administered 2016-07-30 – 2016-08-01 (×3): 0.25 mg via ORAL
  Filled 2016-07-30 (×3): qty 1

## 2016-07-30 MED ORDER — LOSARTAN POTASSIUM-HCTZ 100-12.5 MG PO TABS: 1.0000 | ORAL_TABLET | Freq: Every day | ORAL | Status: DC

## 2016-07-30 MED ORDER — CEFTRIAXONE SODIUM 1 G IJ SOLR
1.0000 g | INTRAMUSCULAR | Status: DC
  Administered 2016-07-30 – 2016-07-31 (×2): 1 g via INTRAVENOUS
  Filled 2016-07-30 (×3): qty 10

## 2016-07-30 MED ORDER — HYDROCHLOROTHIAZIDE 12.5 MG PO CAPS
12.5000 mg | ORAL_CAPSULE | Freq: Every day | ORAL | Status: DC
  Administered 2016-07-30 – 2016-08-01 (×3): 12.5 mg via ORAL
  Filled 2016-07-30 (×4): qty 1

## 2016-07-30 NOTE — Progress Notes (Signed)
PROGRESS NOTE    Theresa Kramer  [id number] DOB: 11-12-1940 DOA: 07/29/2016 PCP: Asencion Noble, MD   Brief Narrative:  Theresa Kramer is a pleasant 75 year old female with a history of hypertension, dyslipidemia, diabetes, admitted to the medicine service on 07/30/2016 when she presented with mental status changes characterized by difficulties following commands, having difficulties preforming simple tasks, having blank stare and possibly work finding difficulties.  Initial workup in the emergency department included a CT scan of brain without contrast which did not show acute abnormality. She was seen and evaluated by neurology and further worked up with an MRI of brain that revealed 2 punctate acute infarcts involving right frontal lobe.      Assessment & Plan:   Principal Problem:   Abnormal behavior Active Problems:   Type 2 diabetes mellitus without complication, with long-term current use of insulin (HCC)   Essential hypertension   Osteoarthritis   Normocytic anemia   Acute encephalopathy  1.  Acute CVA -Theresa Kramer presenting with mental status changes, for which was further workup with MRI of brain which revealed 2 punctate acute infarcts involving the right frontal lobe. -Prior to that she had been on aspirin 81 mg by mouth daily along with statin.  -Case was discussed with neurology who will assess patient today. -Carotid Dopplers showed bilateral 1-39% stenosis  -Transthoracic echocardiogram is pending at time of this dictation. 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 -Continue antiplatelet therapy with aspirin 325 mg by mouth daily along with statin. Will await neurology's recommendations regarding antiplatelet therapy and the possibility of switching to Plavix  2.  Urinary tract infection. -Urinalysis showed the presence of bacteria, nitrates and leukocyte esterase -Starting ceftriaxone 1 g IV every 24 hours  3.   Dyslipidemia -Fasting lipid panel showed LDL of 122 -She has on pravastatin 40 mg by mouth daily  4.  Insulin dependent diabetes mellitus -Continue Lantus 75 units subcutaneous at bedtime   DVT prophylaxis: Lovenox Code Status: Full code Family Communication: Spoke with her daughter present at bedside Disposition Plan:   Consultants:   Neurology  Procedures:     Antimicrobials:   Ceftriaxone   Subjective: Denies focal neurological deficits  Objective: Vitals:   07/30/16 0407 07/30/16 0620 07/30/16 0745 07/30/16 1152  BP: (!) 151/54 (!) 141/57 (!) 144/117 (!) 162/72  Pulse: 69 74 76 86  Resp: 16 18 18 18   Temp: 98.1 F (36.7 C) 98.1 F (36.7 C) 98.4 F (36.9 C) 98 F (36.7 C)  TempSrc: Oral Oral Oral Oral  SpO2: 97% 97% 95% 96%  Weight:      Height:        Intake/Output Summary (Last 24 hours) at 07/30/16 1709 Last data filed at 07/30/16 1400  Gross per 24 hour  Intake              740 ml  Output              900 ml  Net             -160 ml   Filed Weights   07/29/16 1727  Weight: 104.3 kg (230 lb)    Examination:  General exam: Appears calm and comfortable, no acute distress  Respiratory system: Clear to auscultation. Respiratory effort normal. Cardiovascular system: S1 & S2 heard, RRR. No JVD, murmurs, rubs, gallops or clicks. No pedal edema. Gastrointestinal system: Abdomen is nondistended, soft and nontender. No organomegaly or masses felt. Normal bowel  sounds heard. Central nervous system:  No focal neurological deficits. Extremities: Symmetric 5 x 5 power. Skin: No rashes, lesions or ulcers Psychiatry: Judgement and insight appear normal. Mood & affect appropriate.     Data Reviewed: I have personally reviewed following labs and imaging studies  CBC:  Recent Labs Lab 07/29/16 1800 07/30/16 0637  WBC 27.1* 27.3*  NEUTROABS 6.8  --   HGB 10.1* 10.9*  HCT 32.9* 35.6*  MCV 83.7 84.6  PLT 392 [id number]   Basic Metabolic Panel:  Recent  Labs Lab 07/29/16 1800  NA 139  K 3.6  CL 105  CO2 21*  GLUCOSE 86  BUN 36*  CREATININE 0.77  CALCIUM 9.6   GFR: Estimated Creatinine Clearance: 70.2 mL/min (by C-G formula based on SCr of 0.77 mg/dL). Liver Function Tests:  Recent Labs Lab 07/29/16 1800  AST 28  ALT 21  ALKPHOS 54  BILITOT 0.4  PROT 7.3  ALBUMIN 3.7   No results for input(s): LIPASE, AMYLASE in the last 168 hours. No results for input(s): AMMONIA in the last 168 hours. Coagulation Profile:  Recent Labs Lab 07/29/16 1800  INR 0.98   Cardiac Enzymes:  Recent Labs Lab 07/29/16 1800  TROPONINI <0.03   BNP (last 3 results) No results for input(s): PROBNP in the last 8760 hours. HbA1C: No results for input(s): HGBA1C in the last 72 hours. CBG:  Recent Labs Lab 07/29/16 1805 07/29/16 2124 07/29/16 2253 07/30/16 0742 07/30/16 1149  GLUCAP 85 67 131* 68 85   Lipid Profile:  Recent Labs  07/30/16 0637  CHOL 202*  HDL 28*  LDLCALC 122*  TRIG 259*  CHOLHDL 7.2   Thyroid Function Tests: No results for input(s): TSH, T4TOTAL, FREET4, T3FREE, THYROIDAB in the last 72 hours. Anemia Panel: No results for input(s): VITAMINB12, FOLATE, FERRITIN, TIBC, IRON, RETICCTPCT in the last 72 hours. Sepsis Labs: No results for input(s): PROCALCITON, LATICACIDVEN in the last 168 hours.  No results found for this or any previous visit (from the past 240 hour(s)).       Radiology Studies: Ct Head Wo Contrast  Result Date: 07/29/2016 CLINICAL DATA:  75 y/o F; 30 minutes of altered mental status today. EXAM: CT HEAD WITHOUT CONTRAST TECHNIQUE: Contiguous axial images were obtained from the base of the skull through the vertex without intravenous contrast. COMPARISON:  05/03/2011 CT head. FINDINGS: Brain: No evidence of acute infarction, hemorrhage, hydrocephalus, extra-axial collection or mass lesion/mass effect. Chronic microvascular ischemic changes and parenchymal volume loss are mildly  progressed from 2012. Vascular: No hyperdense vessel. Calcific atherosclerosis of cavernous internal carotid artery is and vertebral arteries. Skull: Normal. Negative for fracture or focal lesion. Sinuses/Orbits: No acute finding. Other: None. IMPRESSION: No acute intracranial abnormality is identified. Mild chronic microvascular ischemic changes and parenchymal volume loss are progressed from 2012. Electronically Signed   By: Kristine Garbe M.D.   On: 07/29/2016 18:34   Mr Brain Wo Contrast  Result Date: 07/30/2016 CLINICAL DATA:  30 minute episode altered mental status, concurrent hyperglycemia secondary to steroid use for inflamed ganglion cyst. History of hypertension, diabetes. LEFT transient ischemic attack. EXAM: MRI HEAD WITHOUT CONTRAST MRA HEAD WITHOUT CONTRAST TECHNIQUE: Multiplanar, multiecho pulse sequences of the brain and surrounding structures were obtained without intravenous contrast. Angiographic images of the head were obtained using MRA technique without contrast. COMPARISON:  CT HEAD July 29, 2016 FINDINGS: MRI HEAD FINDINGS BRAIN: Punctate foci of reduced diffusion RIGHT frontal lobe/ anterior genu of the corpus callosum and RIGHT centrum  semiovale. No susceptibility artifact to suggest hemorrhage. Patchy supratentorial pontine white matter FLAIR T2 hyperintensities compatible with mild chronic small vessel ischemic disease, less than expected for age. The ventricles and sulci are normal for patient's age. No suspicious parenchymal signal, masses or mass effect. No abnormal extra-axial fluid collections. No extra-axial masses though, contrast enhanced sequences would be more sensitive. VASCULAR: Normal major intracranial vascular flow voids present at skull base. SKULL AND UPPER CERVICAL SPINE: No abnormal sellar expansion. No suspicious calvarial bone marrow signal. Craniocervical junction maintained. SINUSES/ORBITS: The mastoid air-cells and included paranasal sinuses are  well-aerated. Status post bilateral ocular lens implants. The included ocular globes and orbital contents are non-suspicious. OTHER: Patient is edentulous. MRA HEAD FINDINGS- mildly motion degraded examination. ANTERIOR CIRCULATION: Normal flow related enhancement of the included cervical, petrous, cavernous and supraclinoid internal carotid arteries. 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 Patent anterior communicating artery. Occluded proximal RIGHT A2 segment. Moderate tandem stenosis bilateral mid to distal anterior middle cerebral arteries. No high-grade stenosis, abnormal luminal irregularity. POSTERIOR CIRCULATION: Codominant vertebral artery's. Basilar artery is patent, with normal flow related enhancement of the main branch vessels. Normal flow related enhancement of the posterior cerebral arteries. Mild stenosis proximal LEFT P2 segment. Moderate stenosis proximal LEFT P3 segment . No large vessel occlusion, high-grade stenosis, abnormal luminal irregularity, aneurysm. IMPRESSION: MRI HEAD: 2 punctate acute infarcts RIGHT frontal lobe. Otherwise negative MRI of the head for age. MRA HEAD: Probably acute occlusion RIGHT anterior cerebral artery at A2 segment. Moderate intracranial atherosclerosis. 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 These results will be called to the ordering clinician or representative by the Radiologist Assistant, and communication documented in the PACS or zVision Dashboard. Electronically Signed   By: Elon Alas M.D.   On: 07/30/2016 06:08   Mr Jodene Nam Head/brain [id number] Cm  Result Date: 07/30/2016 CLINICAL DATA:  30 minute episode altered mental status, concurrent hyperglycemia secondary to steroid use for inflamed ganglion cyst. History of hypertension, diabetes. LEFT transient ischemic attack. EXAM: MRI HEAD WITHOUT CONTRAST MRA HEAD WITHOUT CONTRAST TECHNIQUE: Multiplanar, multiecho pulse sequences of the brain  and surrounding structures were obtained without intravenous contrast. Angiographic images of the head were obtained using MRA technique without contrast. COMPARISON:  CT HEAD July 29, 2016 FINDINGS: MRI HEAD FINDINGS BRAIN: Punctate foci of reduced diffusion RIGHT frontal lobe/ anterior genu of the corpus callosum and RIGHT centrum semiovale. No susceptibility artifact to suggest hemorrhage. Patchy supratentorial pontine white matter FLAIR T2 hyperintensities compatible with mild chronic small vessel ischemic disease, less than expected for age. The ventricles and sulci are normal for patient's age. No suspicious parenchymal signal, masses or mass effect. No abnormal extra-axial fluid collections. No extra-axial masses though, contrast enhanced sequences would be more sensitive. VASCULAR: Normal major intracranial vascular flow voids present at skull base. SKULL AND UPPER CERVICAL SPINE: No abnormal sellar expansion. No suspicious calvarial bone marrow signal. Craniocervical junction maintained. SINUSES/ORBITS: The mastoid air-cells and included paranasal sinuses are well-aerated. Status post bilateral ocular lens implants. The included ocular globes and orbital contents are non-suspicious. OTHER: Patient is edentulous. MRA HEAD FINDINGS- mildly motion degraded examination. ANTERIOR CIRCULATION: Normal flow related enhancement of the included cervical, petrous, cavernous and supraclinoid internal carotid arteries. 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 Patent anterior communicating artery. Occluded proximal RIGHT A2 segment. Moderate tandem stenosis bilateral mid to distal anterior middle cerebral arteries. No high-grade stenosis, abnormal luminal irregularity. POSTERIOR CIRCULATION:  Codominant vertebral artery's. Basilar artery is patent, with normal flow related enhancement of the main branch vessels. Normal flow related enhancement of the posterior cerebral  arteries. Mild stenosis proximal LEFT P2 segment. Moderate stenosis proximal LEFT P3 segment . No large vessel occlusion, high-grade stenosis, abnormal luminal irregularity, aneurysm. IMPRESSION: MRI HEAD: 2 punctate acute infarcts RIGHT frontal lobe. Otherwise negative MRI of the head for age. MRA HEAD: Probably acute occlusion RIGHT anterior cerebral artery at A2 segment. Moderate intracranial atherosclerosis. 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 These results will be called to the ordering clinician or representative by the Radiologist Assistant, and communication documented in the PACS or zVision Dashboard. Electronically Signed   By: Elon Alas M.D.   On: 07/30/2016 06:08        Scheduled Meds: . acetaminophen  1,000 mg Oral BID  . amLODipine  5 mg Oral Daily  . aspirin  300 mg Rectal Daily   Or  . aspirin  325 mg Oral Daily  . cefTRIAXone (ROCEPHIN)  IV  1 g Intravenous Q24H  . citalopram  20 mg Oral Daily  . enoxaparin (LOVENOX) injection  40 mg Subcutaneous Q24H  . famotidine  20 mg Oral Daily  . losartan  100 mg Oral Daily   And  . hydrochlorothiazide  12.5 mg Oral Daily  . insulin aspart  0-15 Units Subcutaneous TID WC  . insulin glargine  75 Units Subcutaneous QHS  . oxybutynin  10 mg Oral Daily  . pantoprazole  40 mg Oral Daily  . pramipexole  0.25 mg Oral Daily  . pravastatin  40 mg Oral Daily   Continuous Infusions:    LOS: 0 days    Time spent:     Kelvin Cellar, MD [facility] Pager [phone]  If 7PM-7AM, please contact night-coverage [email or web address] Password [facility] 07/30/2016, 5:09 PM

## 2016-07-30 NOTE — Progress Notes (Signed)
NURSING PROGRESS NOTE  Theresa Kramer [id number] Admission Data: 07/30/2016 2:52 AM Attending Provider: Edwin Dada, MD [id number], MD Code Status: FULL  Allergies:  Meloxicam Past Medical History:   has a past medical history of Arthritis; B12 deficiency; Bronchitis; Complication of anesthesia; Depression; Diabetes mellitus; GERD (gastroesophageal reflux disease); H/O hiatal hernia; Hyperlipidemia; Hypertension; Noncompliance with medication regimen; Pinched cervical nerve root; PONV (postoperative nausea and vomiting); and Urinary tract infection. Past Surgical History:   has a past surgical history that includes Appendectomy; Tonsillectomy; orthopedic surgery; Rotator cuff repair; Knee surgery; Hip Arthroplasty; Eye surgery; Dilation and curettage of uterus; Abdominal hysterectomy; Bone cyst excision; Joint replacement; Cholecystectomy; Anterior cervical decomp/discectomy fusion (03/15/2012); Esophagogastroduodenoscopy (N/A, 09/19/2014); maloney dilation (N/A, 09/19/2014); Savory dilation (N/A, 09/19/2014); Colonoscopy (2011); and Excision metacarpal mass (Left, 03/03/2016). Social History:   reports that she has quit smoking. She has never used smokeless tobacco. She reports that she does not drink alcohol or use drugs.  Theresa Kramer is a 75 y.o. female patient admitted from ED:   Last Documented Vital Signs: Blood pressure (!) 141/47, pulse 61, temperature 98.1 F (36.7 C), temperature source Oral, resp. rate 18, height 5\' 3"  (1.6 m), weight 104.3 kg (230 lb), SpO2 96 %.  Cardiac Monitoring: Box # 21 in place. Cardiac monitor yields:normal sinus rhythm.  IV Fluids:  IV in place, occlusive dsg intact without redness, IV cath antecubital left, condition patent and no redness none.   Skin: Appropriate for ethnicity and intact with exception to incision on left hand from cyst removal prior to admission.  Patient/Family orientated to room. Information packet given to  patient/family. Admission inpatient armband information verified with patient/family to include name and date of birth and placed on patient arm. Side rails up x 2, fall assessment and education completed with patient/family. Patient/family able to verbalize understanding of risk associated with falls and verbalized understanding to call for assistance before getting out of bed. Call light within reach. Patient/family able to voice and demonstrate understanding of unit orientation instructions.    Will continue to evaluate and treat per MD orders.  Clydell Hakim RN, BSN

## 2016-07-30 NOTE — Progress Notes (Signed)
EEG Completed; Results Pending  

## 2016-07-30 NOTE — Progress Notes (Signed)
STROKE TEAM PROGRESS NOTE   HISTORY OF PRESENT ILLNESS (per record) Theresa Kramer is a 75 y.o. female who had a transient episode of abnormal behavior earlier. Her daughter states that she stood up and was walking, holding her arms outstretched and seemed to not know what to do with him as she walked. She then got into the car and tried to buckle her seatbelt, but was unable to operate the buckle. When she tried to exit the car, she put one leg out but then appeared to not know what to do to continue exiting the vehicle. During this, she was able to answer questions, though seemed possibly slightly slow but otherwise appropriate. Due to these symptoms, she presented to the [facility] [place] emergency department at which time her symptoms completely resolved. She was LKW at 4:30 PM on 07/29/2016. Patient was not administered IV t-PA secondary to resolved symptoms. She was admitted for further evaluation and treatment.   SUBJECTIVE (INTERVAL HISTORY) Her daughter is at the bedside.  Overall she feels her condition is completely resolved. Daughter recounted hx with me, sounds like she developed transient apraxia. Had long discussion with her.    OBJECTIVE Temp:  [97.8 F (36.6 C)-98.4 F (36.9 C)] 98.4 F (36.9 C) (09/27 0745) Pulse Rate:  [61-80] 76 (09/27 0745) Cardiac Rhythm: Normal sinus rhythm (09/27 0702) Resp:  [13-25] 18 (09/27 0745) BP: (120-171)/(47-117) 144/117 (09/27 0745) SpO2:  [94 %-98 %] 95 % (09/27 0745) Weight:  [104.3 kg (230 lb)] 104.3 kg (230 lb) (09/26 1727)  CBC:  Recent Labs Lab 07/29/16 1800 07/30/16 0637  WBC 27.1* 27.3*  NEUTROABS 6.8  --   HGB 10.1* 10.9*  HCT 32.9* 35.6*  MCV 83.7 84.6  PLT 392 407*    Basic Metabolic Panel:  Recent Labs Lab 07/29/16 1800  NA 139  K 3.6  CL 105  CO2 21*  GLUCOSE 86  BUN 36*  CREATININE 0.77  CALCIUM 9.6    Lipid Panel:    Component Value Date/Time   CHOL 202 (H) 07/30/2016 0637   TRIG 259 (H) 07/30/2016  0637   HDL 28 (L) 07/30/2016 0637   CHOLHDL 7.2 07/30/2016 0637   VLDL 52 (H) 07/30/2016 0637   LDLCALC 122 (H) 07/30/2016 0637   HgbA1c: No results found for: HGBA1C Urine Drug Screen:    Component Value Date/Time   LABOPIA NONE DETECTED 07/29/2016 1930   COCAINSCRNUR NONE DETECTED 07/29/2016 1930   LABBENZ NONE DETECTED 07/29/2016 1930   AMPHETMU NONE DETECTED 07/29/2016 1930   THCU NONE DETECTED 07/29/2016 1930   LABBARB NONE DETECTED 07/29/2016 1930      IMAGING I have personally reviewed the radiological images below and agree with the radiology interpretations.  Ct Head Wo Contrast 07/29/2016 No acute intracranial abnormality is identified. Mild chronic microvascular ischemic changes and parenchymal volume loss are progressed from 2012.   MRI HEAD 07/30/2016 2 punctate acute infarcts RIGHT frontal lobe. Otherwise negative MRI of the head for age.   MRA HEAD 07/30/2016 Probably acute occlusion RIGHT anterior cerebral artery at A2 segment. Moderate intracranial atherosclerosis. 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   CUS - Bilateral: 1-39% ICA stenosis. Vertebral artery flow is antegrade.  EEG - This is a normal EEG for the patients stated age.  There were no focal, hemispheric or lateralizing features.  No epileptiform activity was recorded.  A normal EEG does not exclude the diagnosis of a seizure disorder and if seizure remains high on  the list of differential diagnosis, an ambulatory EEG may be of value.  Clinical correlation is required.  TTE pending   PHYSICAL EXAM  Temp:  [98 F (36.7 C)-98.6 F (37 C)] 98 F (36.7 C) (09/27 2135) Pulse Rate:  [61-86] 65 (09/27 2135) Resp:  [16-18] 18 (09/27 2135) BP: (117-162)/(47-117) 137/70 (09/27 2135) SpO2:  [95 %-100 %] 99 % (09/27 2135)  General - Well nourished, well developed, in no apparent distress.  Ophthalmologic - Sharp disc margins OU.   Cardiovascular - Regular rate and rhythm.  Mental  Status -  Level of arousal and orientation to time, place, and person were intact. Language including expression, naming, repetition, comprehension was assessed and found intact. Fund of Knowledge was assessed and was intact.  Cranial Nerves II - XII - II - Visual field intact OU. III, IV, VI - Extraocular movements intact. V - Facial sensation intact bilaterally. VII - Facial movement intact bilaterally. VIII - Hearing & vestibular intact bilaterally. X - Palate elevates symmetrically. XI - Chin turning & shoulder shrug intact bilaterally. XII - Tongue protrusion intact.  Motor Strength - The patient's strength was normal in all extremities and pronator drift was absent.  Bulk was normal and fasciculations were absent.   Motor Tone - Muscle tone was assessed at the neck and appendages and was normal.  Reflexes - The patient's reflexes were 1+ in all extremities and she had no pathological reflexes.  Sensory - Light touch, temperature/pinprick were assessed and were symmetrical.    Coordination - The patient had normal movements in the hands and feet with no ataxia or dysmetria.  Tremor was absent.  Gait and Station - The patient's transfers, posture, gait, station, and turns were observed as normal.   ASSESSMENT/PLAN Ms. Theresa Kramer is a 75 y.o. female with history of HTN and DM presenting with abnormal behaviour. She did not receive IV t-PA due to symptoms resolved.   TIA:  No convincing punctate infarcts at MRI DWI. Secondary to small vessel disease source  Resultant  resolved  MRI  No convincing MRI DWI changes  MRA  Probable R A2 occlusion. 40mm L cavernous ICA aneurysm  Carotid Doppler  Unremarkable   2D Echo  Pending  EEG - normal EEG  LDL 122  HgbA1c pending  Lovenox 40 mg sq daily for VTE prophylaxis  Diet heart healthy/carb modified Room service appropriate? Yes; Fluid consistency: Thin  aspirin 81 mg daily prior to admission, now on aspirin 325 mg  daily. Recommend to switch to plavix for further stroke prevention.   Patient counseled to be compliant with her antithrombotic medications  Ongoing aggressive stroke risk factor management  Therapy recommendations:  pending  Disposition:  home (lives with daughter)  Hypertension  Stable  Permissive hypertension (OK if < 220/120) but gradually normalize in 5-7 days  Long-term BP goal normotensive  Hyperlipidemia  Home meds:  pravachol 40, resumed in hospital  LDL 122, goal < 70  Increase pravastatin to 80mg   Continue statin at discharge  Diabetes  HgbA1c pending, goal < 7.0  Uncontrolled at home  Close follow up with PCP and endocrinologist   Other Stroke Risk Factors  Advanced age  Former Cigarette smoker  Morbid Obesity, Body mass index is 40.74 kg/m., recommend weight loss, diet and exercise as appropriate   Other Active Problems  Leukocytosis, on prednisone PTA for inflamed ganglion cyst  Mild UTI - on rocephin  Asymptomatic bacteriuria  Hospital day # 0  Neurology will sign  off. Please call with questions. Pt will follow up with Cecille Rubin NP at [facility] in about 6 weeks. Thanks for the consult.  Rosalin Hawking, MD PhD Stroke Neurology 07/30/2016 11:17 PM  To contact Stroke Continuity provider, please refer to [email or web address] After hours, contact General Neurology

## 2016-07-30 NOTE — Procedures (Signed)
HPI:  75 y/o with MS change  TECHNICAL SUMMARY:  A multichannel referential and bipolar montage EEG using the standard international 10-20 system was performed on the patient described as awake, drowsy and asleep.  The dominant background activity consists of 9.5 hertz activity seen most prominantly over the posterior head region.  The backgound activity is minimally to eye opening and closing procedures.  Low voltage fast (beta) activity is distributed symmetrically and maximally over the anterior head regions.  ACTIVATION:  Stepwise photic stimulation at 4-20 flashes per second was performed and did not elicit any abnormal waveforms.  Hyperventilation was not performed.   EPILEPTIFORM ACTIVITY:  There were no spikes, sharp waves or paroxysmal activity.  SLEEP:  Stage I and stage II sleep architecture identified.  IMPRESSION:  This is a normal EEG for the patients stated age.  There were no focal, hemispheric or lateralizing features.  No epileptiform activity was recorded.  A normal EEG does not exclude the diagnosis of a seizure disorder and if seizure remains high on the list of differential diagnosis, an ambulatory EEG may be of value.  Clinical correlation is required.

## 2016-07-30 NOTE — H&P (Signed)
History and Physical  Patient Name: Theresa Kramer     [id number]    DOB: 1941-10-24    DOA: 07/29/2016 PCP: Asencion Noble, MD   Patient coming from: Home     Chief Complaint: Altered behavior  HPI: Theresa Kramer is a 75 y.o. female with a past medical history significant for HTN, IDDM who presents with abnormal behavior.  History is collected primarily from the patient's daughter at bedside, also the patient. The patient was placed on prednisone last week for an inflamed ganglion cyst and neck pain by her PCP. Over the weekend she had elevated blood sugars, per daughter, which had gotten under control by Monday.  Today the patient's daughter came home after work around lunchtime, and wanted to take her to the movies. She noticed though, that her mother was acting abnormally (seemed to be putting her purse on her shoulder wrong, couldn't buckle her seatbelt, couldn't get out of the car/would put her foot out "but then just stared like she didn't know what to do next").  These odd behaviors happened several times over about half an hour, and then by the time the patient got to the triage desk at [facility], it resolved, ~30 minutes total.  There was no slurred speech, focal weakness, numbness, LOC.  The patient was oriented throughout.  She has mild cognitive impairment, no frank dementia.  She has had no recent fever this weekend, urinary symptoms, dysuria, foul smelling urine, urinary urgency or frequency.    ED course: -Afebrile, heart rate and respirations normal, slightly hypertensive, pulse oximetry normal -Na 137, K 3.6, Cr 0.77 (BUN to creatinine ratio elevated), WBC 27.1 K, Hgb 10.1 -LFTs normal -Alcohol level negative, coags normal, troponin negative, urine drug screen negative -Urinalysis with nitrites and leukocytes -CT head without contrast unremarkable -The patient was discussed with neurology at [facility] who recommended transfer for evaluation and [facility] were asked to  accept        Review of Systems:  Pt complains of confusion episode. All other systems negative except as just noted or noted in the history of present illness.  Past Medical History:  Diagnosis Date  . Arthritis   . B12 deficiency   . Bronchitis    hx of  . Complication of anesthesia   . Depression    "not taking medication"  . Diabetes mellitus   . GERD (gastroesophageal reflux disease)   . H/O hiatal hernia   . Hyperlipidemia   . Hypertension   . Noncompliance with medication regimen   . Pinched cervical nerve root   . PONV (postoperative nausea and vomiting)   . Urinary tract infection    hx of    Past Surgical History:  Procedure Laterality Date  . ABDOMINAL HYSTERECTOMY    . ANTERIOR CERVICAL DECOMP/DISCECTOMY FUSION  03/15/2012   Procedure: ANTERIOR CERVICAL DECOMPRESSION/DISCECTOMY FUSION 2 LEVELS;  Surgeon: Ophelia Charter, MD;  Location: [facility];  Service: Neurosurgery;  Laterality: N/A;  Cervical four-five Cervical five six anterior cervical decompression with fusion interbody prothesis plating and bonegraft  . APPENDECTOMY    . BONE CYST EXCISION     from lower back and foot  . CHOLECYSTECTOMY    . COLONOSCOPY  2011   Dr. Gala Romney: prep compromised exam. diverticulosis, hemorrhoids. next tcs 2021  . DILATION AND CURETTAGE OF UTERUS    . 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 Multiple distal rings dilated and disrupted as described above. Small hiatal  hernia.   Marland Kitchen EXCISION METACARPAL MASS Left 03/03/2016   Procedure: EXCISION OF LEFT  DORSAL MASS OF EXTENSOR TENDON;  Surgeon: Milly Jakob, MD;  Location: [facility];  Service: Orthopedics;  Laterality: Left;  . EYE SURGERY     bilateral cataract surgery,   . HIP ARTHROPLASTY     right  . JOINT REPLACEMENT    . KNEE SURGERY     torn cartilage repair  . MALONEY DILATION N/A 09/19/2014   Procedure: Venia Minks  DILATION;  Surgeon: Daneil Dolin, MD;  Location: [facility];  Service: Endoscopy;  Laterality: N/A;  . ORTHOPEDIC SURGERY    . ROTATOR CUFF REPAIR     "multiple in both shoulders" and total shoulder replacement in left arm  . SAVORY DILATION N/A 09/19/2014   Procedure: SAVORY DILATION;  Surgeon: Daneil Dolin, MD;  Location: [facility];  Service: Endoscopy;  Laterality: N/A;  . TONSILLECTOMY      Social History: Patient lives with her daughter.  Patient walks unassisted.  She is a remote former smoker.  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    Allergies  Allergen Reactions  . Meloxicam     Family history: family history includes Cirrhosis in her brother.          Physical Exam: BP (!) 120/97 (BP Location: Right Arm)   Pulse 79   Temp 98.3 F (36.8 C) (Oral)   Resp 20   Ht 5\' 3"  (1.6 m)   Wt 104.3 kg (230 lb)   SpO2 94%   BMI 40.74 kg/m  General appearance: Well-developed, obese elderly adult female, alert and in no acute distress.   Eyes: No nasal deformity, discharge, epistaxis.  Hearing normal. OP moist without lesions. Edentulous.    ENT: No nasal deformity, discharge, or epistaxis.  OP moist without lesions.   Lymph: No cervical, supraclavicular or axillary lymphadenopathy. Skin: Warm and dry.  No jaundice.  No suspicious rashes or lesions. Cardiac: RRR, nl S1-S2, no murmurs appreciated.  Capillary refill is brisk.  JVP normal.  No LE edema.  Radial and DP pulses 2+ and symmetric.  No carotid bruits. Respiratory: Normal respiratory rate and rhythm.  CTAB without rales or wheezes. GI: Abdomen soft without rigidity.  No TTP. No ascites, distension, no hepatosplenomegaly.   MSK: No deformities or effusions. Neuro: Pupils are 4 mm and reactive to 3 mm. Extraocular movements are intact, without nystagmus. Cranial nerve 5 is within normal limits. Cranial nerve 7 is symmetrical. Cranial nerve 8 is within normal limits. Cranial nerves 9 and 10 reveal  equal palate elevation. Cranial nerve 11 reveals sternocleidomastoid strong. Cranial nerve 12 is midline. I do not note a deficit in motor strength testing in the upper and lower extremities bilaterally with normal motor, tone and bulk. Romberg maneuver is negative for pathology. Finger-to-nose testing is within normal limits. Speech is fluent. Naming is grossly intact. Attention span and concentration are within normal limits.  Babinski normal. Psych: The patient is oriented to time, place and person. Behavior appropriate.  Affect normal.  Recall, recent and remote, as well as general fund of knowledge seem within normal limits. No evidence of aural or visual hallucinations or delusions.       Labs on Admission:  I have personally reviewed following labs and imaging studies: CBC:  Recent Labs Lab 07/29/16 1800  WBC 27.1*  NEUTROABS 6.8  HGB 10.1*  HCT 32.9*  MCV 83.7  PLT 392  Basic Metabolic Panel:  Recent Labs Lab 07/29/16 1800  NA 139  K 3.6  CL 105  CO2 21*  GLUCOSE 86  BUN 36*  CREATININE 0.77  CALCIUM 9.6   GFR: Estimated Creatinine Clearance: 70.2 mL/min (by C-G formula based on SCr of 0.77 mg/dL). Liver Function Tests:  Recent Labs Lab 07/29/16 1800  AST 28  ALT 21  ALKPHOS 54  BILITOT 0.4  PROT 7.3  ALBUMIN 3.7   No results for input(s): LIPASE, AMYLASE in the last 168 hours. No results for input(s): AMMONIA in the last 168 hours. Coagulation Profile:  Recent Labs Lab 07/29/16 1800  INR 0.98   Cardiac Enzymes:  Recent Labs Lab 07/29/16 1800  TROPONINI <0.03   BNP (last 3 results) No results for input(s): PROBNP in the last 8760 hours. HbA1C: No results for input(s): HGBA1C in the last 72 hours. CBG:  Recent Labs Lab 07/29/16 1805 07/29/16 2124 07/29/16 2253  GLUCAP 85 67 131*   Lipid Profile: No results for input(s): CHOL, HDL, LDLCALC, TRIG, CHOLHDL, LDLDIRECT in the last 72 hours. Thyroid Function Tests: No results for  input(s): TSH, T4TOTAL, FREET4, T3FREE, THYROIDAB in the last 72 hours. Anemia Panel: No results for input(s): VITAMINB12, FOLATE, FERRITIN, TIBC, IRON, RETICCTPCT in the last 72 hours. Sepsis Labs: Invalid input(s): PROCALCITONIN, LACTICIDVEN No results found for this or any previous visit (from the past 240 hour(s)).    Radiological Exams on Admission: Personally reviewed following CT report: Ct Head Wo Contrast  Result Date: 07/29/2016 CLINICAL DATA:  75 y/o F; 30 minutes of altered mental status today. EXAM: CT HEAD WITHOUT CONTRAST TECHNIQUE: Contiguous axial images were obtained from the base of the skull through the vertex without intravenous contrast. COMPARISON:  05/03/2011 CT head. FINDINGS: Brain: No evidence of acute infarction, hemorrhage, hydrocephalus, extra-axial collection or mass lesion/mass effect. Chronic microvascular ischemic changes and parenchymal volume loss are mildly progressed from 2012. Vascular: No hyperdense vessel. Calcific atherosclerosis of cavernous internal carotid artery is and vertebral arteries. Skull: Normal. Negative for fracture or focal lesion. Sinuses/Orbits: No acute finding. Other: None. IMPRESSION: No acute intracranial abnormality is identified. Mild chronic microvascular ischemic changes and parenchymal volume loss are progressed from 2012. Electronically Signed   By: Kristine Garbe M.D.   On: 07/29/2016 18:34     EKG: Independently reviewed. Rate 72, QTc 454, normal sinus rhythm without ST changes.    Assessment/Plan 1. Abnormal behavior:  This is new.  Agree with neurology, apraxia from TIA versus some delirium in setting of steroid use, dehydration, or UTI.  ABCD would be 4. -Admit to telemetry -Neuro checks, NIHSS per protocol -Daily aspirin 325 mg from 81 mg -Lipids, hemoglobin A1c -Carotid doppler, MRA or CT angiography of head and neck per Neurology, ordered -Echocardiogram ordered -Consult to Neurology, appreciate  recommendations   2. Leukocytosis:  This is new.  Likely from prednisone.    3. Asymptomatic bacteriuria:  -Urine culture added on -Defer antibiotics for now  4. HTN:  -Continue amlodipine, losartan, HCTZ -Continue statin  5. IDDM:  -Hold metformin, glipizide -SSI -Continue Lantus  6. Other medications:  -Hold nonessential gemfibrozil -Continue ranitidine -Continue Mirapex -Continue oxybutynin     DVT prophylaxis: Lovenox  Code Status: FULL  Family Communication: Daughter at bedside  Disposition Plan: Anticipate Stroke work up as above and consult to ancillary services.  Expect discharge within 1 days if testing negative. Consults called: Neurology, Dr. Leonel Ramsay has seen patient. Admission status: Telemetry, OBS status  Core measures: -  VTE prophylaxis ordered at time of admission -Aspirin ordered at admission -Atrial fibrillation: not present -tPA not given because of symptoms resolved -Dysphagia screen ordered -Lipids ordered -Nonsmoker -PT eval deferred because of minimal symptoms    Medical decision making: Patient seen at 1:02 AM on 07/30/2016.  The patient was discussed with Dr. Regenia Skeeter. What exists of the patient's chart was reviewed in depth and summarized above.  Clinical condition: stable.       Edwin Dada [facility] Pager [phone]

## 2016-07-30 NOTE — Progress Notes (Signed)
VASCULAR LAB PRELIMINARY  PRELIMINARY  PRELIMINARY  PRELIMINARY  Carotid duplex completed.    Preliminary report:  Bilateral:  1-39% ICA stenosis.  Vertebral artery flow is antegrade.     Theresa Kramer, RVS 07/30/2016, 11:30 AM

## 2016-07-31 ENCOUNTER — Telehealth: Payer: Self-pay | Admitting: Hematology

## 2016-07-31 ENCOUNTER — Encounter: Payer: Self-pay | Admitting: Hematology

## 2016-07-31 DIAGNOSIS — I639 Cerebral infarction, unspecified: Secondary | ICD-10-CM

## 2016-07-31 DIAGNOSIS — E785 Hyperlipidemia, unspecified: Secondary | ICD-10-CM

## 2016-07-31 LAB — CBC
HCT: 34.9 % — ABNORMAL LOW (ref 36.0–46.0)
Hemoglobin: 10.4 g/dL — ABNORMAL LOW (ref 12.0–15.0)
MCH: 25 pg — ABNORMAL LOW (ref 26.0–34.0)
MCHC: 29.8 g/dL — AB (ref 30.0–36.0)
MCV: 83.9 fL (ref 78.0–100.0)
PLATELETS: 348 10*3/uL (ref 150–400)
RBC: 4.16 MIL/uL (ref 3.87–5.11)
RDW: 15.1 % (ref 11.5–15.5)
WBC: 24.9 10*3/uL — AB (ref 4.0–10.5)

## 2016-07-31 LAB — BASIC METABOLIC PANEL
Anion gap: 8 (ref 5–15)
BUN: 23 mg/dL — AB (ref 6–20)
CO2: 26 mmol/L (ref 22–32)
CREATININE: 0.81 mg/dL (ref 0.44–1.00)
Calcium: 9.2 mg/dL (ref 8.9–10.3)
Chloride: 102 mmol/L (ref 101–111)
GFR calc Af Amer: >60 mL/min (ref >60)
GLUCOSE: 235 mg/dL — AB (ref 65–99)
Potassium: 4.2 mmol/L (ref 3.5–5.1)
SODIUM: 136 mmol/L (ref 135–145)

## 2016-07-31 LAB — HEMOGLOBIN A1C
HEMOGLOBIN A1C: 8.3 % — AB (ref 4.8–5.6)
MEAN PLASMA GLUCOSE: 192 mg/dL

## 2016-07-31 LAB — GLUCOSE, CAPILLARY
GLUCOSE-CAPILLARY: 249 mg/dL — AB (ref 65–99)
GLUCOSE-CAPILLARY: 239 mg/dL — AB (ref 65–99)
Glucose-Capillary: 203 mg/dL — ABNORMAL HIGH (ref 65–99)
Glucose-Capillary: 205 mg/dL — ABNORMAL HIGH (ref 65–99)

## 2016-07-31 MED ORDER — CIPROFLOXACIN HCL 500 MG PO TABS: 500.0000 mg | ORAL_TABLET | Freq: Two times a day (BID) | ORAL | 0 refills | Status: DC

## 2016-07-31 MED ORDER — CLOPIDOGREL BISULFATE 75 MG PO TABS: 75.0000 mg | ORAL_TABLET | Freq: Every day | ORAL | 1 refills | Status: DC

## 2016-07-31 MED ORDER — PRAVASTATIN SODIUM 80 MG PO TABS: 80.0000 mg | ORAL_TABLET | Freq: Every day | ORAL | 3 refills | Status: AC

## 2016-07-31 MED ORDER — PRAVASTATIN SODIUM 80 MG PO TABS: 80.0000 mg | ORAL_TABLET | Freq: Every day | ORAL | 3 refills | Status: DC

## 2016-07-31 NOTE — Progress Notes (Signed)
Pts dght at bedside. Pt still waiting for echo to be preformed. Echo tech paged. Will continue to monitor.

## 2016-07-31 NOTE — Discharge Summary (Addendum)
Physician Discharge Summary  Theresa Kramer [id number] DOB: Feb 02, 1941 DOA: 07/29/2016  PCP: Asencion Noble, MD  Admit date: 07/29/2016 Discharge date: 08/01/2016  Time spent: 35 minutes  Recommendations for Outpatient Follow-up:  1. During this hospitalization Neurology recommended switching ASA to Plavix and increasing Pravastatin to 80 mg PO q daily 2. Please follow up on 2D echo, results pending at the time of dictation.  3. Please follow up on CBC, she had leukocytosis on CBC with white count 25-27,000, lymphocytic predominant with atypical lymphocytes. Case was discussed with Dr Burr Medico on Hematology at [facility], who agreed to see her in the clinic.    Discharge Diagnoses:  Principal Problem:   Abnormal behavior Active Problems:   Type 2 diabetes mellitus without complication, with long-term current use of insulin (HCC)   Essential hypertension   Osteoarthritis   Normocytic anemia   Acute encephalopathy   Acute CVA (cerebrovascular accident) ([place])   HLD (hyperlipidemia)   Discharge Condition: Stable  Diet recommendation: Heart Healthy  Filed Weights   07/29/16 1727  Weight: 104.3 kg (230 lb)    History of present illness:  Theresa Kramer is a 75 y.o. female with a past medical history significant for HTN, IDDM who presents with abnormal behavior.  History is collected primarily from the patient's daughter at bedside, also the patient. The patient was placed on prednisone last week for an inflamed ganglion cyst and neck pain by her PCP. Over the weekend she had elevated blood sugars, per daughter, which had gotten under control by Monday.  Today the patient's daughter came home after work around lunchtime, and wanted to take her to the movies. She noticed though, that her mother was acting abnormally (seemed to be putting her purse on her shoulder wrong, couldn't buckle her seatbelt, couldn't get out of the car/would put her foot out "but then just stared  like she didn't know what to do next").  These odd behaviors happened several times over about half an hour, and then by the time the patient got to the triage desk at [facility], it resolved, ~30 minutes total.  There was no slurred speech, focal weakness, numbness, LOC.  The patient was oriented throughout.  She has mild cognitive impairment, no frank dementia.  She has had no recent fever this weekend, urinary symptoms, dysuria, foul smelling urine, urinary urgency or frequency.    Hospital Course:  [name] is a pleasant 75 year old female with a history of hypertension, dyslipidemia, diabetes, admitted to the medicine service on 07/30/2016 when she presented with mental status changes characterized by difficulties following commands, having difficulties preforming simple tasks, having blank stare and possibly work finding difficulties.  Initial workup in the emergency department included a CT scan of brain without contrast which did not show acute abnormality. She was seen and evaluated by neurology and further worked up with an MRI of brain that revealed 2 punctate acute infarcts involving right frontal lobe. I discussed this with Dr Erlinda Hong of Neurology who was not convinced MRI findings were consistent with acute CVA. MRA showed right anterior cerebral artery A2 segment occlusion. Neurology recommended switching aspirin to Plavix any 5 mg by mouth daily and increasing pravastatin from 40-80 mg by mouth daily. She did not have further neurological deficits during this hospitalization. Other issues include leukocytosis seen on CBC. Initial CBC showed a white count of 27,100, lymphocyte predominant with atypical lymphocytes. During this hospitalization her white count remained elevated in the 25-27,000 range. Case was discussed  with Dr. Burr Medico of hematology who recommended follow-up at the oncology clinic. She was discharged home in stable condition on 07/31/2016.   Procedures:  Pending transthoracic  echocardiogram  Bilateral carotid Doppler showing 1-39% ICA stenosis  Consultations:  Neurology  Discharge Exam: Vitals:   07/31/16 0525 07/31/16 1249  BP: 130/61 (!) 113/56  Pulse: 71 75  Resp: 18 18  Temp: 98.2 F (36.8 C) 97.8 F (36.6 C)   General exam: Appears calm and comfortable, no acute distress  Respiratory system: Clear to auscultation. Respiratory effort normal. Cardiovascular system: S1 & S2 heard, RRR. No JVD, murmurs, rubs, gallops or clicks. No pedal edema. Gastrointestinal system: Abdomen is nondistended, soft and nontender. No organomegaly or masses felt. Normal bowel sounds heard. Central nervous system:  No focal neurological deficits. Extremities: Symmetric 5 x 5 power. Skin: No rashes, lesions or ulcers Psychiatry: Judgement and insight appear normal. Mood & affect appropriate.    Discharge Instructions   Discharge Instructions    Ambulatory referral to Neurology    Complete by:  As directed    Follow up with NP Cecille Rubin at [facility] in about 6 weeks. Thanks.   Call MD for:    Complete by:  As directed    Call MD for:  difficulty breathing, headache or visual disturbances    Complete by:  As directed    Call MD for:  extreme fatigue    Complete by:  As directed    Call MD for:  hives    Complete by:  As directed    Call MD for:  persistant dizziness or light-headedness    Complete by:  As directed    Call MD for:  persistant nausea and vomiting    Complete by:  As directed    Call MD for:  redness, tenderness, or signs of infection (pain, swelling, redness, odor or green/yellow discharge around incision site)    Complete by:  As directed    Call MD for:  severe uncontrolled pain    Complete by:  As directed    Call MD for:  temperature >100.4    Complete by:  As directed    Diet - low sodium heart healthy    Complete by:  As directed    Increase activity slowly    Complete by:  As directed      Current Discharge Medication List     START taking these medications   Details  ciprofloxacin (CIPRO) 500 MG tablet Take 1 tablet (500 mg total) by mouth 2 (two) times daily. Qty: 6 tablet, Refills: 0    clopidogrel (PLAVIX) 75 MG tablet Take 1 tablet (75 mg total) by mouth daily. Qty: 30 tablet, Refills: 1      CONTINUE these medications which have CHANGED   Details  pravastatin (PRAVACHOL) 80 MG tablet Take 1 tablet (80 mg total) by mouth daily. Qty: 30 tablet, Refills: 3      CONTINUE these medications which have NOT CHANGED   Details  acetaminophen (TYLENOL) 650 MG CR tablet Take 1,300 mg by mouth 2 (two) times daily.    amLODipine (NORVASC) 5 MG tablet Take 5 mg by mouth daily.    benzonatate (TESSALON) 200 MG capsule Take 200 mg by mouth 3 (three) times daily as needed for cough.    citalopram (CELEXA) 20 MG tablet Take 20 mg by mouth daily.      clotrimazole (LOTRIMIN) 1 % cream Apply 1 application topically 2 (two) times daily as needed. For  rash     cyanocobalamin (,VITAMIN B-12,) 1000 MCG/ML injection Inject 1,000 mcg into the muscle every 30 (thirty) days. Usually on the 1st     gemfibrozil (LOPID) 600 MG tablet Take 600 mg by mouth 2 (two) times daily before a meal.      glipiZIDE (GLUCOTROL XL) 5 MG 24 hr tablet Take 5 mg by mouth daily.      HYDROcodone-homatropine (HYCODAN) 5-1.5 MG/5ML syrup Take 5 mLs by mouth every 4 (four) hours as needed for cough.    insulin aspart (NOVOLOG FLEXPEN) 100 UNIT/ML injection Inject 12 Units into the skin 3 (three) times daily before meals.     insulin glargine (LANTUS) 100 UNIT/ML injection Inject 70 Units into the skin at bedtime.     losartan-hydrochlorothiazide (HYZAAR) 100-12.5 MG tablet Take 1 tablet by mouth daily.    metFORMIN (GLUCOPHAGE) 1000 MG tablet Take 1,000 mg by mouth 2 (two) times daily with a meal.      oxybutynin (DITROPAN-XL) 10 MG 24 hr tablet Take 10 mg by mouth daily.     pantoprazole (PROTONIX) 40 MG tablet Take 40 mg by mouth daily.     pramipexole (MIRAPEX) 0.25 MG tablet Take 0.25 mg by mouth daily.      ranitidine (ZANTAC) 150 MG tablet Take 150 mg by mouth at bedtime.      STOP taking these medications     aspirin 81 MG tablet      naproxen (NAPROSYN) 250 MG tablet        Allergies  Allergen Reactions  . Meloxicam    Follow-up Information    MARTIN,NANCY CAROLYN, NP Follow up in 6 week(s).   Specialty:  Family Medicine Why:  call for a appointment closer to the date Contact information: [address] [place] [address] [place], [place] Follow up on 08/07/2016.   Specialty:  Internal Medicine Why:  Appointment with Dr. Carloyn Manner is on 08/07/16 at 11:45am Contact information: [address] [place] [address] [phone]        Truitt Merle, MD Follow up in 1 week(s).   Specialties:  Hematology, Oncology Contact information: [place] [id number] [phone]            The results of significant diagnostics from this hospitalization (including imaging, microbiology, ancillary and laboratory) are listed below for reference.    Significant Diagnostic Studies: Ct Head Wo Contrast  Result Date: 07/29/2016 CLINICAL DATA:  75 y/o F; 30 minutes of altered mental status today. EXAM: CT HEAD WITHOUT CONTRAST TECHNIQUE: Contiguous axial images were obtained from the base of the skull through the vertex without intravenous contrast. COMPARISON:  05/03/2011 CT head. FINDINGS: Brain: No evidence of acute infarction, hemorrhage, hydrocephalus, extra-axial collection or mass lesion/mass effect. Chronic microvascular ischemic changes and parenchymal volume loss are mildly progressed from 2012. Vascular: No hyperdense vessel. Calcific atherosclerosis of cavernous internal carotid artery is and vertebral arteries. Skull: Normal. Negative for fracture or focal lesion. Sinuses/Orbits: No acute finding. Other: None. IMPRESSION: No acute intracranial  abnormality is identified. Mild chronic microvascular ischemic changes and parenchymal volume loss are progressed from 2012. Electronically Signed   By: Kristine Garbe M.D.   On: 07/29/2016 18:34   Mr Brain Wo Contrast  Result Date: 07/30/2016 CLINICAL DATA:  30 minute episode altered mental status, concurrent hyperglycemia secondary to steroid use for inflamed ganglion cyst. History of hypertension, diabetes. LEFT transient ischemic attack. EXAM: MRI HEAD  WITHOUT CONTRAST MRA HEAD WITHOUT CONTRAST TECHNIQUE: Multiplanar, multiecho pulse sequences of the brain and surrounding structures were obtained without intravenous contrast. Angiographic images of the head were obtained using MRA technique without contrast. COMPARISON:  CT HEAD July 29, 2016 FINDINGS: MRI HEAD FINDINGS BRAIN: Punctate foci of reduced diffusion RIGHT frontal lobe/ anterior genu of the corpus callosum and RIGHT centrum semiovale. No susceptibility artifact to suggest hemorrhage. Patchy supratentorial pontine white matter FLAIR T2 hyperintensities compatible with mild chronic small vessel ischemic disease, less than expected for age. The ventricles and sulci are normal for patient's age. No suspicious parenchymal signal, masses or mass effect. No abnormal extra-axial fluid collections. No extra-axial masses though, contrast enhanced sequences would be more sensitive. VASCULAR: Normal major intracranial vascular flow voids present at skull base. SKULL AND UPPER CERVICAL SPINE: No abnormal sellar expansion. No suspicious calvarial bone marrow signal. Craniocervical junction maintained. SINUSES/ORBITS: The mastoid air-cells and included paranasal sinuses are well-aerated. Status post bilateral ocular lens implants. The included ocular globes and orbital contents are non-suspicious. OTHER: Patient is edentulous. MRA HEAD FINDINGS- mildly motion degraded examination. ANTERIOR CIRCULATION: Normal flow related enhancement of the  included cervical, petrous, cavernous and supraclinoid internal carotid arteries. 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 Patent anterior communicating artery. Occluded proximal RIGHT A2 segment. Moderate tandem stenosis bilateral mid to distal anterior middle cerebral arteries. No high-grade stenosis, abnormal luminal irregularity. POSTERIOR CIRCULATION: Codominant vertebral artery's. Basilar artery is patent, with normal flow related enhancement of the main branch vessels. Normal flow related enhancement of the posterior cerebral arteries. Mild stenosis proximal LEFT P2 segment. Moderate stenosis proximal LEFT P3 segment . No large vessel occlusion, high-grade stenosis, abnormal luminal irregularity, aneurysm. IMPRESSION: MRI HEAD: 2 punctate acute infarcts RIGHT frontal lobe. Otherwise negative MRI of the head for age. MRA HEAD: Probably acute occlusion RIGHT anterior cerebral artery at A2 segment. Moderate intracranial atherosclerosis. 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 These results will be called to the ordering clinician or representative by the Radiologist Assistant, and communication documented in the PACS or zVision Dashboard. Electronically Signed   By: Elon Alas M.D.   On: 07/30/2016 06:08   Mr Jodene Nam Head/brain [id number] Cm  Result Date: 07/30/2016 CLINICAL DATA:  30 minute episode altered mental status, concurrent hyperglycemia secondary to steroid use for inflamed ganglion cyst. History of hypertension, diabetes. LEFT transient ischemic attack. EXAM: MRI HEAD WITHOUT CONTRAST MRA HEAD WITHOUT CONTRAST TECHNIQUE: Multiplanar, multiecho pulse sequences of the brain and surrounding structures were obtained without intravenous contrast. Angiographic images of the head were obtained using MRA technique without contrast. COMPARISON:  CT HEAD July 29, 2016 FINDINGS: MRI HEAD FINDINGS BRAIN: Punctate foci of reduced diffusion RIGHT  frontal lobe/ anterior genu of the corpus callosum and RIGHT centrum semiovale. No susceptibility artifact to suggest hemorrhage. Patchy supratentorial pontine white matter FLAIR T2 hyperintensities compatible with mild chronic small vessel ischemic disease, less than expected for age. The ventricles and sulci are normal for patient's age. No suspicious parenchymal signal, masses or mass effect. No abnormal extra-axial fluid collections. No extra-axial masses though, contrast enhanced sequences would be more sensitive. VASCULAR: Normal major intracranial vascular flow voids present at skull base. SKULL AND UPPER CERVICAL SPINE: No abnormal sellar expansion. No suspicious calvarial bone marrow signal. Craniocervical junction maintained. SINUSES/ORBITS: The mastoid air-cells and included paranasal sinuses are well-aerated. Status post bilateral ocular lens implants. The included ocular globes and orbital contents are non-suspicious. OTHER: Patient is edentulous. MRA  HEAD FINDINGS- mildly motion degraded examination. ANTERIOR CIRCULATION: Normal flow related enhancement of the included cervical, petrous, cavernous and supraclinoid internal carotid arteries. 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 Patent anterior communicating artery. Occluded proximal RIGHT A2 segment. Moderate tandem stenosis bilateral mid to distal anterior middle cerebral arteries. No high-grade stenosis, abnormal luminal irregularity. POSTERIOR CIRCULATION: Codominant vertebral artery's. Basilar artery is patent, with normal flow related enhancement of the main branch vessels. Normal flow related enhancement of the posterior cerebral arteries. Mild stenosis proximal LEFT P2 segment. Moderate stenosis proximal LEFT P3 segment . No large vessel occlusion, high-grade stenosis, abnormal luminal irregularity, aneurysm. IMPRESSION: MRI HEAD: 2 punctate acute infarcts RIGHT frontal lobe. Otherwise negative  MRI of the head for age. MRA HEAD: Probably acute occlusion RIGHT anterior cerebral artery at A2 segment. Moderate intracranial atherosclerosis. 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 These results will be called to the ordering clinician or representative by the Radiologist Assistant, and communication documented in the PACS or zVision Dashboard. Electronically Signed   By: Elon Alas M.D.   On: 07/30/2016 06:08    Microbiology: Recent Results (from the past 240 hour(s))  Urine culture     Status: Abnormal (Preliminary result)   Collection Time: 07/29/16  7:30 PM  Result Value Ref Range Status   Specimen Description   Final    URINE, RANDOM Performed at [facility] Requests NONE  Final   Culture (A)  Final    >=100,000 COLONIES/mL KLEBSIELLA PNEUMONIAE SUSCEPTIBILITIES TO FOLLOW    Report Status PENDING  Incomplete     Labs: Basic Metabolic Panel:  Recent Labs Lab 07/29/16 1800 07/31/16 0510  NA 139 136  K 3.6 4.2  CL 105 102  CO2 21* 26  GLUCOSE 86 235*  BUN 36* 23*  CREATININE 0.77 0.81  CALCIUM 9.6 9.2   Liver Function Tests:  Recent Labs Lab 07/29/16 1800  AST 28  ALT 21  ALKPHOS 54  BILITOT 0.4  PROT 7.3  ALBUMIN 3.7   No results for input(s): LIPASE, AMYLASE in the last 168 hours. No results for input(s): AMMONIA in the last 168 hours. CBC:  Recent Labs Lab 07/29/16 1800 07/30/16 0637 07/31/16 0510  WBC 27.1* 27.3* 24.9*  NEUTROABS 6.8  --   --   HGB 10.1* 10.9* 10.4*  HCT 32.9* 35.6* 34.9*  MCV 83.7 84.6 83.9  PLT 392 407* 348   Cardiac Enzymes:  Recent Labs Lab 07/29/16 1800  TROPONINI <0.03   BNP: BNP (last 3 results) No results for input(s): BNP in the last 8760 hours.  ProBNP (last 3 results) No results for input(s): PROBNP in the last 8760 hours.  CBG:  Recent Labs Lab 07/30/16 1149 07/30/16 1721 07/30/16 2140 07/31/16 0812 07/31/16 1147  GLUCAP 85 155* 271* 203* 249*        Signed:  Kelvin Cellar MD.  [facility] 07/31/2016, 1:24 PM

## 2016-07-31 NOTE — Care Management Note (Addendum)
Case Management Note  Patient Details  Name: Theresa Kramer MRN: [id number] Date of Birth: 08-Jun-1941  Subjective/Objective:       Admitted with AMS, hx of HTN, IDDM.From home with famiy. Independent with ADL's PTA. No DME usage.            PCP: Asencion Noble  Action/Plan:  08/01/2016  Pt  waiting for 2Decho, Plan is to  d/c after echo completed.  Plan to d/c to home today.  Expected Discharge Date:   07/31/2016           Expected Discharge Plan:  Home/Self Care  In-House Referral:     Discharge planning Services  CM Consult  Status of Service:  Completed, signed off  If discussed at [place] of Stay Meetings, dates discussed:    Additional Comments:  Sharin Mons, RN 07/31/2016, 10:12 AM

## 2016-07-31 NOTE — Telephone Encounter (Signed)
Per Dr. Ernestina Penna msg is to see a hematologist. Telephone call made to the pt's daughter. Appt scheduled w/Kale on 10/27 at 830am. She voiced understanding. Demographics verified. Letter mailed.

## 2016-07-31 NOTE — Progress Notes (Signed)
Echo tech paged. Echo tech stated that a cardiologist is the only one that can call him in. Stated unless an emergency, echo will not be done tonight.

## 2016-07-31 NOTE — Progress Notes (Signed)
Pt and dght updated on status of echo, and that they would have to spend another night, as the echo is a part of the stroke work-up. Dght very upset, but resigned to stay the night. Dght requests very early d/c in the am once the echo is complete. Told pt we would try out best.

## 2016-08-01 ENCOUNTER — Encounter (HOSPITAL_COMMUNITY): Payer: Self-pay | Admitting: *Deleted

## 2016-08-01 ENCOUNTER — Emergency Department (HOSPITAL_COMMUNITY): Payer: Medicare Other

## 2016-08-01 ENCOUNTER — Inpatient Hospital Stay (HOSPITAL_COMMUNITY): Payer: Medicare Other

## 2016-08-01 ENCOUNTER — Inpatient Hospital Stay (HOSPITAL_COMMUNITY)
Admission: EM | Admit: 2016-08-01 | Discharge: 2016-08-04 | Disposition: A | Discharge status: Home / Self Care | Payer: Medicare Other | Source: Home / Self Care | Attending: Internal Medicine | Admitting: Internal Medicine

## 2016-08-01 DIAGNOSIS — E785 Hyperlipidemia, unspecified: Secondary | ICD-10-CM | POA: Diagnosis present

## 2016-08-01 DIAGNOSIS — N390 Urinary tract infection, site not specified: Secondary | ICD-10-CM | POA: Diagnosis present

## 2016-08-01 DIAGNOSIS — E119 Type 2 diabetes mellitus without complications: Secondary | ICD-10-CM

## 2016-08-01 DIAGNOSIS — I63521 Cerebral infarction due to unspecified occlusion or stenosis of right anterior cerebral artery: Secondary | ICD-10-CM | POA: Diagnosis present

## 2016-08-01 DIAGNOSIS — Z794 Long term (current) use of insulin: Secondary | ICD-10-CM

## 2016-08-01 DIAGNOSIS — I639 Cerebral infarction, unspecified: Secondary | ICD-10-CM

## 2016-08-01 DIAGNOSIS — G459 Transient cerebral ischemic attack, unspecified: Secondary | ICD-10-CM

## 2016-08-01 DIAGNOSIS — M5481 Occipital neuralgia: Secondary | ICD-10-CM

## 2016-08-01 DIAGNOSIS — I10 Essential (primary) hypertension: Secondary | ICD-10-CM | POA: Diagnosis present

## 2016-08-01 DIAGNOSIS — D72829 Elevated white blood cell count, unspecified: Secondary | ICD-10-CM | POA: Diagnosis present

## 2016-08-01 DIAGNOSIS — D649 Anemia, unspecified: Secondary | ICD-10-CM | POA: Diagnosis present

## 2016-08-01 DIAGNOSIS — I69815 Cognitive social or emotional deficit following other cerebrovascular disease: Secondary | ICD-10-CM

## 2016-08-01 LAB — GLUCOSE, CAPILLARY
Glucose-Capillary: 189 mg/dL — ABNORMAL HIGH (ref 65–99)
Glucose-Capillary: 189 mg/dL — ABNORMAL HIGH (ref 65–99)

## 2016-08-01 LAB — URINE CULTURE: Culture: >=100000 — AB

## 2016-08-01 LAB — CBC
HEMATOCRIT: 37 % (ref 36.0–46.0)
Hemoglobin: 11.1 g/dL — ABNORMAL LOW (ref 12.0–15.0)
MCH: 25.6 pg — AB (ref 26.0–34.0)
MCHC: 30 g/dL (ref 30.0–36.0)
MCV: 85.3 fL (ref 78.0–100.0)
PLATELETS: 422 10*3/uL — AB (ref 150–400)
RBC: 4.34 MIL/uL (ref 3.87–5.11)
RDW: 15.2 % (ref 11.5–15.5)
WBC: 30.1 10*3/uL — AB (ref 4.0–10.5)

## 2016-08-01 LAB — BASIC METABOLIC PANEL
Anion gap: 12 (ref 5–15)
BUN: 28 mg/dL — AB (ref 6–20)
CHLORIDE: 97 mmol/L — AB (ref 101–111)
CO2: 22 mmol/L (ref 22–32)
CREATININE: 1.03 mg/dL — AB (ref 0.44–1.00)
Calcium: 9.1 mg/dL (ref 8.9–10.3)
GFR calc Af Amer: >60 mL/min (ref >60)
GFR calc non Af Amer: 52 mL/min — ABNORMAL LOW (ref >60)
GLUCOSE: 310 mg/dL — AB (ref 65–99)
POTASSIUM: 4.5 mmol/L (ref 3.5–5.1)
Sodium: 131 mmol/L — ABNORMAL LOW (ref 135–145)

## 2016-08-01 LAB — ECHOCARDIOGRAM COMPLETE
HEIGHTINCHES: 63 in
WEIGHTICAEL: 3680 [oz_av]

## 2016-08-01 MED ORDER — CIPROFLOXACIN HCL 500 MG PO TABS
500.0000 mg | ORAL_TABLET | Freq: Two times a day (BID) | ORAL | Status: DC
  Administered 2016-08-01: 500 mg via ORAL
  Filled 2016-08-01: qty 1

## 2016-08-01 MED ORDER — PERFLUTREN LIPID MICROSPHERE
1.0000 mL | INTRAVENOUS | Status: AC | PRN
  Administered 2016-08-01: 1 mL via INTRAVENOUS
  Filled 2016-08-01: qty 10

## 2016-08-01 MED ORDER — CIPROFLOXACIN HCL 500 MG PO TABS: 500.0000 mg | ORAL_TABLET | Freq: Two times a day (BID) | ORAL | 0 refills | Status: DC

## 2016-08-01 MED ORDER — SODIUM CHLORIDE 0.9 % IV BOLUS (SEPSIS)
500.0000 mL | Freq: Once | INTRAVENOUS | Status: AC
  Administered 2016-08-01: 500 mL via INTRAVENOUS

## 2016-08-01 NOTE — ED Notes (Signed)
Attempted report 

## 2016-08-01 NOTE — ED Notes (Signed)
Pt to CT at this time.

## 2016-08-01 NOTE — ED Provider Notes (Signed)
[facility] DEPT Provider Note   CSN: [id number] Arrival date & time: 08/01/16  1702     History   Chief Complaint Chief Complaint  Patient presents with  . Weakness    HPI Theresa Kramer is a 75 y.o. female.  The history is provided by the patient and medical records.  Weakness    75 year old female with history of arthritis, B12 deficiency, depression, diabetes, GERD, hyperlipidemia, hypertension, recent right frontal stroke, presenting to the ED for weakness. Family reports patient was admitted on 07/30/2016 for TIA/CVA workup.  They state they were given conflicting ideas whether her MRI actually showed a stroke or not.  Patient's daughter whom she lives with states got much better while in the hospital, was almost back to her baseline.  States she was able to walk several laps around the floor upstairs and was ready to go home. They report when she got in the car she was having recurrent difficulty performing tasks with her left hand which was present prior. They report when they got home she was unable to figure out how to get out of car even after being instructed how to do so. They had to have neighbors help her in and out of the car. Daughter states all last night she was up and down to the bathroom several times, requiring help each time which is abnormal for her.  States generally she is very independent, going to water aerobics alone, cooking, etc.  today, daughter reports she appears much worse. States she has barely been using her left arm and is appearing somewhat confused. States her speech seemed slurred and she has been leaning to the left. States she is not able to walk on her own currently. She's not had any fever, cough, or other URI type symptoms.  She was started on plavix and ciprofloxacin for presumed UTI, no other significant medication changes at time of discharge.  Family returns today with concern regarding her rapid decline since hospital discharge.  Past  Medical History:  Diagnosis Date  . Arthritis   . B12 deficiency   . Bronchitis    hx of  . Complication of anesthesia   . Depression    "not taking medication"  . Diabetes mellitus   . GERD (gastroesophageal reflux disease)   . H/O hiatal hernia   . Hyperlipidemia   . Hypertension   . Noncompliance with medication regimen   . Pinched cervical nerve root   . PONV (postoperative nausea and vomiting)   . Urinary tract infection    hx of    Patient Active Problem List   Diagnosis Date Noted  . Abnormal behavior 07/30/2016  . Type 2 diabetes mellitus without complication, with long-term current use of insulin ([place]) 07/30/2016  . Essential hypertension 07/30/2016  . Osteoarthritis 07/30/2016  . Normocytic anemia 07/30/2016  . Acute CVA (cerebrovascular accident) ([place]) 07/30/2016  . Acute encephalopathy   . HLD (hyperlipidemia)   . Schatzki's ring   . Cervical herniated disc 03/15/2012  . GERD 04/29/2010  . CHEST PAIN, ATYPICAL 04/29/2010  . DYSPHAGIA UNSPECIFIED 04/29/2010    Past Surgical History:  Procedure Laterality Date  . ABDOMINAL HYSTERECTOMY    . ANTERIOR CERVICAL DECOMP/DISCECTOMY FUSION  03/15/2012   Procedure: ANTERIOR CERVICAL DECOMPRESSION/DISCECTOMY FUSION 2 LEVELS;  Surgeon: Ophelia Charter, MD;  Location: [facility];  Service: Neurosurgery;  Laterality: N/A;  Cervical four-five Cervical five six anterior cervical decompression with fusion interbody prothesis plating and bonegraft  . APPENDECTOMY    .  BONE CYST EXCISION     from lower back and foot  . CHOLECYSTECTOMY    . COLONOSCOPY  2011   Dr. Gala Romney: prep compromised exam. diverticulosis, hemorrhoids. next tcs 2021  . DILATION AND CURETTAGE OF UTERUS    . 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 Multiple distal rings dilated and disrupted as described above. Small hiatal hernia.   Marland Kitchen EXCISION METACARPAL MASS Left 03/03/2016    Procedure: EXCISION OF LEFT  DORSAL MASS OF EXTENSOR TENDON;  Surgeon: Milly Jakob, MD;  Location: [facility];  Service: Orthopedics;  Laterality: Left;  . EYE SURGERY     bilateral cataract surgery,   . HIP ARTHROPLASTY     right  . JOINT REPLACEMENT    . KNEE SURGERY     torn cartilage repair  . MALONEY DILATION N/A 09/19/2014   Procedure: Venia Minks DILATION;  Surgeon: Daneil Dolin, MD;  [facility];  Service: Endoscopy;  Laterality: N/A;  . ORTHOPEDIC SURGERY    . ROTATOR CUFF REPAIR     "multiple in both shoulders" and total shoulder replacement in left arm  . SAVORY DILATION N/A 09/19/2014   Procedure: SAVORY DILATION;  Surgeon: Daneil Dolin, MD;  Location: [facility];  Service: Endoscopy;  Laterality: N/A;  . TONSILLECTOMY      OB History    No data available       Home Medications    Prior to Admission medications   Medication Sig Start Date End Date Taking? Authorizing Provider  acetaminophen (TYLENOL) 650 MG CR tablet Take 1,300 mg by mouth 2 (two) times daily.    Historical Provider, MD  amLODipine (NORVASC) 5 MG tablet Take 5 mg by mouth daily.    Historical Provider, MD  benzonatate (TESSALON) 200 MG capsule Take 200 mg by mouth 3 (three) times daily as needed for cough.    Historical Provider, MD  ciprofloxacin (CIPRO) 500 MG tablet Take 1 tablet (500 mg total) by mouth 2 (two) times daily. 07/31/16   Kelvin Cellar, MD  ciprofloxacin (CIPRO) 500 MG tablet Take 1 tablet (500 mg total) by mouth 2 (two) times daily. 08/01/16   Kelvin Cellar, MD  citalopram (CELEXA) 20 MG tablet Take 20 mg by mouth daily.      Historical Provider, MD  clopidogrel (PLAVIX) 75 MG tablet Take 1 tablet (75 mg total) by mouth daily. 08/01/16   Kelvin Cellar, MD  clotrimazole (LOTRIMIN) 1 % cream Apply 1 application topically 2 (two) times daily as needed. For rash     Historical Provider, MD  cyanocobalamin (,VITAMIN B-12,) 1000 MCG/ML injection Inject  1,000 mcg into the muscle every 30 (thirty) days. Usually on the 1st     Historical Provider, MD  gemfibrozil (LOPID) 600 MG tablet Take 600 mg by mouth 2 (two) times daily before a meal.      Historical Provider, MD  glipiZIDE (GLUCOTROL XL) 5 MG 24 hr tablet Take 5 mg by mouth daily.      Historical Provider, MD  HYDROcodone-homatropine (HYCODAN) 5-1.5 MG/5ML syrup Take 5 mLs by mouth every 4 (four) hours as needed for cough.    Historical Provider, MD  insulin aspart (NOVOLOG FLEXPEN) 100 UNIT/ML injection Inject 12 Units into the skin 3 (three) times daily before meals.     Historical Provider, MD  insulin glargine (LANTUS) 100 UNIT/ML injection Inject 70 Units into the skin at bedtime.     Historical Provider,  MD  losartan-hydrochlorothiazide (HYZAAR) 100-12.5 MG tablet Take 1 tablet by mouth daily.    Historical Provider, MD  metFORMIN (GLUCOPHAGE) 1000 MG tablet Take 1,000 mg by mouth 2 (two) times daily with a meal.      Historical Provider, MD  oxybutynin (DITROPAN-XL) 10 MG 24 hr tablet Take 10 mg by mouth daily.     Historical Provider, MD  pantoprazole (PROTONIX) 40 MG tablet Take 40 mg by mouth daily.    Historical Provider, MD  pramipexole (MIRAPEX) 0.25 MG tablet Take 0.25 mg by mouth daily.      Historical Provider, MD  pravastatin (PRAVACHOL) 80 MG tablet Take 1 tablet (80 mg total) by mouth daily. 08/01/16   Kelvin Cellar, MD  ranitidine (ZANTAC) 150 MG tablet Take 150 mg by mouth at bedtime.    Historical Provider, MD    Family History Family History  Problem Relation Age of Onset  . Cirrhosis Brother     etoh  . Colon cancer Neg Hx     Social History Social History  Substance Use Topics  . Smoking status: Former Research scientist (life sciences)  . Smokeless tobacco: Never Used     Comment: stopped 25 years ago  . Alcohol use No     Allergies   Meloxicam   Review of Systems Review of Systems  Neurological: Positive for weakness.  All other systems reviewed and are  negative.    Physical Exam Updated Vital Signs BP 146/83 (BP Location: Right Arm)   Pulse 96   Temp 98.7 F (37.1 C) (Oral)   Resp 18   SpO2 97%   Physical Exam  Constitutional: She appears well-developed and well-nourished.  HENT:  Head: Normocephalic and atraumatic.  Mouth/Throat: Oropharynx is clear and moist.  Eyes: Conjunctivae and EOM are normal. Pupils are equal, round, and reactive to light.  EOMs intact, eyes crossing midline, pupils symmetric and reactive bilaterally  Neck: Normal range of motion.  Cardiovascular: Normal rate, regular rhythm and normal heart sounds.   Pulmonary/Chest: Effort normal and breath sounds normal.  Abdominal: Soft. Bowel sounds are normal.  Musculoskeletal: Normal range of motion.  Neurological: She is alert.  Awake, alert, appears somewhat confused about current situation, speaking with mild slurring of words, some of which out of context; slight weakness noted of left arm compared with right; normal strength of both legs; some difficulty following commands regarding movement of the left arm/leg; negative pronator drift; significant lean to the left when ambulating, unable to stand or walk without significant assistance  Skin: Skin is warm and dry.  Psychiatric: She has a normal mood and affect.  Nursing note and vitals reviewed.    ED Treatments / Results  Labs (all labs ordered are listed, but only abnormal results are displayed) Labs Reviewed  BASIC METABOLIC PANEL - Abnormal; Notable for the following:       Result Value   Sodium 131 (*)    Chloride 97 (*)    Glucose, Bld 310 (*)    BUN 28 (*)    Creatinine, Ser 1.03 (*)    GFR calc non Af Amer 52 (*)    All other components within normal limits  CBC - Abnormal; Notable for the following:    WBC 30.1 (*)    Hemoglobin 11.1 (*)    MCH 25.6 (*)    Platelets 422 (*)    All other components within normal limits  URINALYSIS, ROUTINE W REFLEX MICROSCOPIC (NOT AT [facility])  CBG  MONITORING, ED  EKG  EKG Interpretation None       Radiology Ct Head Wo Contrast  Result Date: 08/01/2016 CLINICAL DATA:  Subacute onset of left-sided weakness. Initial encounter. EXAM: CT HEAD WITHOUT CONTRAST TECHNIQUE: Contiguous axial images were obtained from the base of the skull through the vertex without intravenous contrast. COMPARISON:  MRI of the brain performed 07/30/2016, and CT of the head performed 07/29/2016 FINDINGS: Brain: There is a new evolving acute infarct at the medial distribution of the right anterior cerebral artery, along the right frontal lobe. This is much larger than the previously noted small punctate infarcts in this region. Mild associated mass effect is noted, without evidence of significant midline shift. Prominence of the ventricles and sulci reflects mild cortical volume loss. There is no evidence of hemorrhagic transformation, hydrocephalus or mass lesion. The brainstem and fourth ventricle are within normal limits. The basal ganglia are unremarkable in appearance. Vascular: No hyperdense vessel or unexpected calcification. Skull: There is no evidence of fracture; visualized osseous structures are unremarkable in appearance. Sinuses/Orbits: The orbits are within normal limits. The paranasal sinuses and mastoid air cells are well-aerated. Other: No significant soft tissue abnormalities are seen. IMPRESSION: 1. 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 This is much larger than the previously noted small punctate infarcts in this region. Mild associated mass effect, without significant midline shift. 2. Mild cortical volume loss noted. These results were called by telephone at the time of interpretation on 08/01/2016 at 9:32 pm to [facility] PA, who verbally acknowledged these results. Electronically Signed   By: Garald Balding M.D.   On: 08/01/2016 21:33    Procedures Procedures  (including critical care time)  Medications Ordered in ED Medications - No data to display   Initial Impression / Assessment and Plan / ED Course  I have reviewed the triage vital signs and the nursing notes.  Pertinent labs & imaging results that were available during my care of the patient were reviewed by me and considered in my medical decision making (see chart for details).  Clinical Course   75 year old female here with worsening left-sided weakness. Release from the hospital this morning after stroke workup. Was seemingly almost back to baseline at time of discharge, however he declined this afternoon. On exam she appears somewhat confused regarding her current situation. Speech is slurred and some of her thoughts out of context. She does have weakness of the left arm and some difficulty following commands on the left side. She leans significantly to the left when ambulating, unable to stand or ambulate without significant assistance. Most recent MRI with 2 small punctate lesions in the right frontal lobe. Given this, concern for expanding or new stroke.  Lab work mostly unchanged from yesterday.  Will repeat head CT.  Repeat CT with expansion of stroke, much larger than prior.  Some mass effect without shift.  Will consult with neurology.  Will need re-admission given rapid decline and worsening deficits.  9:58 PM Discussed with neurology, Dr. Leonel Ramsay-- does not need repeat stroke work-up, will need PT/OT given worsening deficits.  Ok to stick with plavix for now.  Admit to medicine for ongoing care.  Final Clinical Impressions(s) / ED Diagnoses   Final diagnoses:  Cerebrovascular accident (CVA), unspecified mechanism [facility])    New Prescriptions New Prescriptions   No medications on file     Larene Pickett, PA-C 08/01/16 [place],  MD 08/02/16 [id number]

## 2016-08-01 NOTE — ED Triage Notes (Signed)
Pt and family reports that pt was admitted on Tuesday for weakness. Was dc home from 5W this afternoon. Family reports pt still not acting like herself, has weakness, fatigue, leaning to the right when walking and seems to be neglecting left arm. Pt reports pain to right side of neck.

## 2016-08-01 NOTE — Progress Notes (Signed)
PROGRESS NOTE    Theresa Kramer  [id number] DOB: 02/27/1941 DOA: 07/29/2016 PCP: Asencion Noble, MD   Brief Narrative:  Theresa Kramer is a pleasant 75 year old female with a history of hypertension, dyslipidemia, diabetes, admitted to the medicine service on 07/30/2016 when she presented with mental status changes characterized by difficulties following commands, having difficulties preforming simple tasks, having blank stare and possibly work finding difficulties.  Initial workup in the emergency department included a CT scan of brain without contrast which did not show acute abnormality. She was seen and evaluated by neurology and further worked up with an MRI of brain that revealed 2 punctate acute infarcts involving right frontal lobe.      Assessment & Plan:   Principal Problem:   Abnormal behavior Active Problems:   Type 2 diabetes mellitus without complication, with long-term current use of insulin ([facility])   Essential hypertension   Osteoarthritis   Normocytic anemia   Acute encephalopathy   Acute CVA (cerebrovascular accident) ([place])   HLD (hyperlipidemia)  1.  Acute CVA -Theresa Kramer presenting with mental status changes, for which was further workup with MRI of brain which revealed 2 punctate acute infarcts involving the right frontal lobe. -Prior to that she had been on aspirin 81 mg by mouth daily along with statin.  -Case was discussed with neurology who will assess patient today. -Carotid Dopplers showed bilateral 1-39% stenosis  -Transthoracic echocardiogram is pending at time of this dictation. 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  2.  Urinary tract infection. -Urinalysis showed the presence of bacteria, nitrates and leukocyte esterase -Changed to Cipro  3.  Dyslipidemia -Fasting lipid panel showed LDL of 122 -She has on pravastatin 40 mg by mouth daily  4.  Insulin dependent diabetes mellitus -Continue Lantus 75 units  subcutaneous at bedtime  Disposition Discharge was held today because 2 D echo could not be done. She will be kept overnight with plans to do echo in am.   DVT prophylaxis: Lovenox Code Status: Full code Family Communication: Spoke with her daughter present at bedside Disposition Plan: Likely discharge in am  Consultants:   Neurology  Procedures:     Antimicrobials:   Ceftriaxone   Subjective: Denies focal neurological deficits  Objective: Vitals:   07/31/16 2140 08/01/16 0208 08/01/16 0615 08/01/16 1028  BP: (!) 133/51 (!) 129/59 (!) 143/61 132/69  Pulse: 72 72 66 86  Resp:  20 20 18   Temp:  98.4 F (36.9 C) 98.1 F (36.7 C) 98.8 F (37.1 C)  TempSrc:  Oral Oral Oral  SpO2:  96% 99% 97%  Weight:      Height:       No intake or output data in the 24 hours ending 08/01/16 1159 Filed Weights   07/29/16 1727  Weight: 104.3 kg (230 lb)    Examination:  General exam: Appears calm and comfortable, no acute distress  Respiratory system: Clear to auscultation. Respiratory effort normal. Cardiovascular system: S1 & S2 heard, RRR. No JVD, murmurs, rubs, gallops or clicks. No pedal edema. Gastrointestinal system: Abdomen is nondistended, soft and nontender. No organomegaly or masses felt. Normal bowel sounds heard. Central nervous system:  No focal neurological deficits. Extremities: Symmetric 5 x 5 power. Skin: No rashes, lesions or ulcers Psychiatry: Judgement and insight appear normal. Mood & affect appropriate.     Data Reviewed: I have personally reviewed following labs and imaging studies  CBC:  Recent Labs Lab 07/29/16 1800 07/30/16  [id number] 07/31/16 0510  WBC 27.1* 27.3* 24.9*  NEUTROABS 6.8  --   --   HGB 10.1* 10.9* 10.4*  HCT 32.9* 35.6* 34.9*  MCV 83.7 84.6 83.9  PLT 392 407* 0000000   Basic Metabolic Panel:  Recent Labs Lab 07/29/16 1800 07/31/16 0510  NA 139 136  K 3.6 4.2  CL 105 102  CO2 21* 26  GLUCOSE 86 235*  BUN 36* 23*    CREATININE 0.77 0.81  CALCIUM 9.6 9.2   GFR: Estimated Creatinine Clearance: 69.3 mL/min (by C-G formula based on SCr of 0.81 mg/dL). Liver Function Tests:  Recent Labs Lab 07/29/16 1800  AST 28  ALT 21  ALKPHOS 54  BILITOT 0.4  PROT 7.3  ALBUMIN 3.7   No results for input(s): LIPASE, AMYLASE in the last 168 hours. No results for input(s): AMMONIA in the last 168 hours. Coagulation Profile:  Recent Labs Lab 07/29/16 1800  INR 0.98   Cardiac Enzymes:  Recent Labs Lab 07/29/16 1800  TROPONINI <0.03   BNP (last 3 results) No results for input(s): PROBNP in the last 8760 hours. HbA1C:  Recent Labs  07/30/16 0637  HGBA1C 8.3*   CBG:  Recent Labs Lab 07/31/16 0812 07/31/16 1147 07/31/16 1745 07/31/16 2134 08/01/16 0806  GLUCAP 203* 249* 239* 205* 189*   Lipid Profile:  Recent Labs  07/30/16 0637  CHOL 202*  HDL 28*  LDLCALC 122*  TRIG 259*  CHOLHDL 7.2   Thyroid Function Tests: No results for input(s): TSH, T4TOTAL, FREET4, T3FREE, THYROIDAB in the last 72 hours. Anemia Panel: No results for input(s): VITAMINB12, FOLATE, FERRITIN, TIBC, IRON, RETICCTPCT in the last 72 hours. Sepsis Labs: No results for input(s): PROCALCITON, LATICACIDVEN in the last 168 hours.  Recent Results (from the past 240 hour(s))  Urine culture     Status: Abnormal   Collection Time: 07/29/16  7:30 PM  Result Value Ref Range Status   Specimen Description   Final    URINE, RANDOM Performed at [place] Requests NONE  Final   Culture >=100,000 COLONIES/mL KLEBSIELLA PNEUMONIAE (A)  Final   Report Status 08/01/2016 FINAL  Final   Organism ID, Bacteria KLEBSIELLA PNEUMONIAE (A)  Final      Susceptibility   Klebsiella pneumoniae - MIC*    AMPICILLIN >=32 RESISTANT Resistant     CEFAZOLIN <=4 SENSITIVE Sensitive     CEFTRIAXONE <=1 SENSITIVE Sensitive     CIPROFLOXACIN <=0.25 SENSITIVE Sensitive     GENTAMICIN <=1 SENSITIVE Sensitive      IMIPENEM <=0.25 SENSITIVE Sensitive     NITROFURANTOIN 64 INTERMEDIATE Intermediate     TRIMETH/SULFA <=20 SENSITIVE Sensitive     AMPICILLIN/SULBACTAM 4 SENSITIVE Sensitive     PIP/TAZO <=4 SENSITIVE Sensitive     Extended ESBL NEGATIVE Sensitive     * >=100,000 COLONIES/mL KLEBSIELLA PNEUMONIAE         Radiology Studies: No results found.      Scheduled Meds: . acetaminophen  1,000 mg Oral BID  . amLODipine  5 mg Oral Daily  . ciprofloxacin  500 mg Oral BID  . citalopram  20 mg Oral Daily  . clopidogrel  75 mg Oral Daily  . enoxaparin (LOVENOX) injection  40 mg Subcutaneous Q24H  . famotidine  20 mg Oral Daily  . losartan  100 mg Oral Daily   And  . hydrochlorothiazide  12.5 mg Oral Daily  . insulin aspart  0-15 Units Subcutaneous TID WC  .  insulin glargine  75 Units Subcutaneous QHS  . oxybutynin  10 mg Oral Daily  . pantoprazole  40 mg Oral Daily  . pramipexole  0.25 mg Oral Daily  . pravastatin  80 mg Oral Daily   Continuous Infusions:    LOS: 2 days    Time spent: 15 min    Kelvin Cellar, MD [facility] Pager [phone]  If 7PM-7AM, please contact night-coverage [email or web address] Password [email or web address] 08/01/2016, 11:59 AM

## 2016-08-01 NOTE — Progress Notes (Signed)
  Echocardiogram 2D Echocardiogram with Definity has been performed.  Theresa Kramer M 08/01/2016, 11:03 AM

## 2016-08-01 NOTE — Progress Notes (Signed)
Patient discharge instructions given.  Patient with IV removed.  Patient verbalized understanding of discharge plan.   Discharge vitals are: Vitals:   08/01/16 0615 08/01/16 1028  BP: (!) 143/61 132/69  Pulse: 66 86  Resp: 20 18  Temp: 98.1 F (36.7 C) 98.8 F (37.1 C)   Discharge medications are:   Medication List    STOP taking these medications   aspirin 81 MG tablet   naproxen 250 MG tablet Commonly known as:  NAPROSYN     TAKE these medications   acetaminophen 650 MG CR tablet Commonly known as:  TYLENOL Take 1,300 mg by mouth 2 (two) times daily.   amLODipine 5 MG tablet Commonly known as:  NORVASC Take 5 mg by mouth daily.   benzonatate 200 MG capsule Commonly known as:  TESSALON Take 200 mg by mouth 3 (three) times daily as needed for cough.   ciprofloxacin 500 MG tablet Commonly known as:  CIPRO Take 1 tablet (500 mg total) by mouth 2 (two) times daily.   ciprofloxacin 500 MG tablet Commonly known as:  CIPRO Take 1 tablet (500 mg total) by mouth 2 (two) times daily.   citalopram 20 MG tablet Commonly known as:  CELEXA Take 20 mg by mouth daily.   clopidogrel 75 MG tablet Commonly known as:  PLAVIX Take 1 tablet (75 mg total) by mouth daily.   clotrimazole 1 % cream Commonly known as:  LOTRIMIN Apply 1 application topically 2 (two) times daily as needed. For rash   cyanocobalamin 1000 MCG/ML injection Commonly known as:  (VITAMIN B-12) Inject 1,000 mcg into the muscle every 30 (thirty) days. Usually on the 1st   gemfibrozil 600 MG tablet Commonly known as:  LOPID Take 600 mg by mouth 2 (two) times daily before a meal.   glipiZIDE 5 MG 24 hr tablet Commonly known as:  GLUCOTROL XL Take 5 mg by mouth daily.   HYDROcodone-homatropine 5-1.5 MG/5ML syrup Commonly known as:  HYCODAN Take 5 mLs by mouth every 4 (four) hours as needed for cough.   insulin glargine 100 UNIT/ML injection Commonly known as:  LANTUS Inject 70 Units into the skin at  bedtime.   losartan-hydrochlorothiazide 100-12.5 MG tablet Commonly known as:  HYZAAR Take 1 tablet by mouth daily.   metFORMIN 1000 MG tablet Commonly known as:  GLUCOPHAGE Take 1,000 mg by mouth 2 (two) times daily with a meal.   NOVOLOG FLEXPEN 100 UNIT/ML injection Generic drug:  insulin aspart Inject 12 Units into the skin 3 (three) times daily before meals.   oxybutynin 10 MG 24 hr tablet Commonly known as:  DITROPAN-XL Take 10 mg by mouth daily.   pantoprazole 40 MG tablet Commonly known as:  PROTONIX Take 40 mg by mouth daily.   pramipexole 0.25 MG tablet Commonly known as:  MIRAPEX Take 0.25 mg by mouth daily.   pravastatin 80 MG tablet Commonly known as:  PRAVACHOL Take 1 tablet (80 mg total) by mouth daily. What changed:  medication strength  how much to take   ranitidine 150 MG tablet Commonly known as:  ZANTAC Take 150 mg by mouth at bedtime.      Patient discharged with family Theresa Kramer 08/01/2016 at 1312

## 2016-08-01 NOTE — H&P (Signed)
History and Physical  Patient Name: Theresa Kramer     [id number]    DOB: [birth year]    DOA: 08/01/2016 PCP: Asencion Noble, MD   Patient coming from: Hospital --> home --> ER     Chief Complaint: Left sided weakness and slurred speech  HPI: Theresa Kramer is a 75 y.o. female with a past medical history significant for HTN, IDDM who presents with left sided weakness.  The patient was admitted 2 days ago with nonspecific abnormal behaviors thought initially to be related to TIA versus delirium from some underlying neurologic stressor. MRI/MRA were obtained which showed some punctate findings in the right ACA distribution, ultimately thought by stroke team not to be infarction, and the patient was treated for a UTI, and hydrated.  Echocardiogram was obtained that was normal.  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  Carotid dopplers were obtained but are still pending.  Per family, the patient seemed to have "cloudy thinking", and be more sluggish than usual throughout her time in the hospital, requiring more assistance with transfers than she normally would, and sometimes holding her left side abnormally still. However, this morning the day of discharge, she was able to get up and ambulatory but times on the nurse's station, and so she was discharged early in the day having had her statin increased and aspirin changed to Plavix.  The sooner she left the hospital however, family noticed that she was weak. She had trouble getting out of the car when they got home from the hospital. She was mentally clear, but appeared to be leaning to the left side, and needed help walking to the bathroom. She took a nap, but when she got up she was still leaning to the left side, seemed to have slurred speech or "[place]", and listed to the left side, so they brought her back to the ER.  ED course: -Afebrile, heart rate 90s, respirations and pulse oximetry normal, blood pressure 153/68 -Na 131,  K 4.5, Cr 1.03 (baseline 0.8), WBC 30.1 K, Hgb 11.1, glucose 310 -EKG was normal -CT of the head without contrast showed an evolving infarct in the distribution of the right ACA -Case was discussed with Neurology, who recommended conservative mgmt and admission for PT and TRH were asked to evaluate      Review of systems:  Review of Systems  Constitutional: Negative for chills and fever.  Eyes: Negative for blurred vision and double vision.  Neurological: Positive for speech change and focal weakness (and leaning to left). Negative for dizziness, tingling, tremors, sensory change, seizures, loss of consciousness and headaches.  All other systems reviewed and are negative.        Past Medical History:  Diagnosis Date  . Arthritis   . B12 deficiency   . Bronchitis    hx of  . Complication of anesthesia   . Depression    "not taking medication"  . Diabetes mellitus   . GERD (gastroesophageal reflux disease)   . H/O hiatal hernia   . Hyperlipidemia   . Hypertension   . Noncompliance with medication regimen   . Pinched cervical nerve root   . PONV (postoperative nausea and vomiting)   . Urinary tract infection    hx of    Past Surgical History:  Procedure Laterality Date  . ABDOMINAL HYSTERECTOMY    . ANTERIOR CERVICAL DECOMP/DISCECTOMY FUSION  03/15/2012   Procedure: ANTERIOR CERVICAL DECOMPRESSION/DISCECTOMY FUSION 2 LEVELS;  Surgeon: Ophelia Charter, MD;  Location: [facility];  Service: Neurosurgery;  Laterality: N/A;  Cervical four-five Cervical five six anterior cervical decompression with fusion interbody prothesis plating and bonegraft  . APPENDECTOMY    . BONE CYST EXCISION     from lower back and foot  . CHOLECYSTECTOMY    . COLONOSCOPY  2011   Dr. Gala Romney: prep compromised exam. diverticulosis, hemorrhoids. next tcs 2021  . DILATION AND CURETTAGE OF UTERUS    . 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 Multiple distal rings dilated and disrupted as described above. Small hiatal hernia.   Marland Kitchen EXCISION METACARPAL MASS Left 03/03/2016   Procedure: EXCISION OF LEFT  DORSAL MASS OF EXTENSOR TENDON;  Surgeon: Milly Jakob, MD;  Location: [facility];  Service: Orthopedics;  Laterality: Left;  . EYE SURGERY     bilateral cataract surgery,   . HIP ARTHROPLASTY     right  . JOINT REPLACEMENT    . KNEE SURGERY     torn cartilage repair  . MALONEY DILATION N/A 09/19/2014   Procedure: Venia Minks DILATION;  Surgeon: Daneil Dolin, MD;  [facility];  Service: Endoscopy;  Laterality: N/A;  . ORTHOPEDIC SURGERY    . ROTATOR CUFF REPAIR     "multiple in both shoulders" and total shoulder replacement in left arm  . SAVORY DILATION N/A 09/19/2014   Procedure: SAVORY DILATION;  Surgeon: Daneil Dolin, MD;  Location: [facility];  Service: Endoscopy;  Laterality: N/A;  . TONSILLECTOMY      Social History: Patient lives with her daughter.  Patient walks unassisted.  She is a remote former smoker.  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  Has a chihuahua.  Allergies  Allergen Reactions  . Meloxicam Other (See Comments)    Stomach pains    Family history: family history includes Cirrhosis in her brother.  Prior to Admission medications   Medication Sig Start Date End Date Taking? Authorizing Provider  acetaminophen (TYLENOL) 650 MG CR tablet Take 1,300 mg by mouth 2 (two) times daily.    Historical Provider, MD  amLODipine (NORVASC) 5 MG tablet Take 5 mg by mouth daily.    Historical Provider, MD  benzonatate (TESSALON) 200 MG capsule Take 200 mg by mouth 3 (three) times daily as needed for cough.    Historical Provider, MD  ciprofloxacin (CIPRO) 500 MG tablet Take 1 tablet (500 mg total) by mouth 2 (two) times daily. 07/31/16   Kelvin Cellar, MD  ciprofloxacin (CIPRO) 500 MG tablet Take 1 tablet (500 mg total) by mouth 2 (two) times daily.  08/01/16   Kelvin Cellar, MD  citalopram (CELEXA) 20 MG tablet Take 20 mg by mouth daily.      Historical Provider, MD  clopidogrel (PLAVIX) 75 MG tablet Take 1 tablet (75 mg total) by mouth daily. 08/01/16   Kelvin Cellar, MD  clotrimazole (LOTRIMIN) 1 % cream Apply 1 application topically 2 (two) times daily as needed. For rash     Historical Provider, MD  cyanocobalamin (,VITAMIN B-12,) 1000 MCG/ML injection Inject 1,000 mcg into the muscle every 30 (thirty) days. Usually on the 1st     Historical Provider, MD  gemfibrozil (LOPID) 600 MG tablet Take 600 mg by mouth 2 (two) times daily before a meal.      Historical Provider, MD  glipiZIDE (GLUCOTROL XL) 5 MG 24 hr tablet Take 5 mg by mouth daily.      Historical  Provider, MD  HYDROcodone-homatropine [facility]) 5-1.5 MG/5ML syrup Take 5 mLs by mouth every 4 (four) hours as needed for cough.    Historical Provider, MD  insulin aspart (NOVOLOG FLEXPEN) 100 UNIT/ML injection Inject 12 Units into the skin 3 (three) times daily before meals.     Historical Provider, MD  insulin glargine (LANTUS) 100 UNIT/ML injection Inject 70 Units into the skin at bedtime.     Historical Provider, MD  losartan-hydrochlorothiazide (HYZAAR) 100-12.5 MG tablet Take 1 tablet by mouth daily.    Historical Provider, MD  metFORMIN (GLUCOPHAGE) 1000 MG tablet Take 1,000 mg by mouth 2 (two) times daily with a meal.      Historical Provider, MD  oxybutynin (DITROPAN-XL) 10 MG 24 hr tablet Take 10 mg by mouth daily.     Historical Provider, MD  pantoprazole (PROTONIX) 40 MG tablet Take 40 mg by mouth daily.    Historical Provider, MD  pramipexole (MIRAPEX) 0.25 MG tablet Take 0.25 mg by mouth daily.      Historical Provider, MD  pravastatin (PRAVACHOL) 80 MG tablet Take 1 tablet (80 mg total) by mouth daily. 08/01/16   Kelvin Cellar, MD  ranitidine (ZANTAC) 150 MG tablet Take 150 mg by mouth at bedtime.    Historical Provider, MD     Physical Exam: BP 146/83 (BP Location:  Right Arm)   Pulse 96   Temp 98.4 F (36.9 C)   Resp 18   SpO2 97%  General appearance: Well-developed, obese elderly adult female, awake but listless and in no acute distress.   Eyes: Anicteric, conjunctiva pink, lids and lashes normal. PERRL.    ENT: No nasal deformity, discharge, epistaxis.  Hearing normal. OP dry without lesions.   Dentition with upper plate. Lymph: No cervical, supraclavicular or axillary lymphadenopathy. Skin: Warm and dry.  No jaundice.  No suspicious rashes or lesions. Cardiac: RRR, nl S1-S2, no murmurs appreciated.  Capillary refill is brisk.  JVP not visible.  No LE edema.  Radial and DP pulses 2+ and symmetric.  No carotid bruits. Respiratory: Normal respiratory rate and rhythm.  CTAB without rales or wheezes. GI: Abdomen soft without rigidity.  No TTP. No ascites, distension, no hepatosplenomegaly.   MSK: No deformities or effusions. Neuro: Pupils are 4 mm and reactive to 3 mm. Extraocular movements are intact, without nystagmus. Cranial nerve 5 is within normal limits. Cranial nerve 7 is symmetrical. Cranial nerve 8 is within normal limits. Cranial nerves 9 and 10 reveal equal palate elevation but voice hypophonic. Cranial nerve 11 reveals sternocleidomastoid strong. Cranial nerve 12 is midline. Subte deficit in left hand squeeze, elbow extension, left hip flexion, and left knee extension (note: old left shoulder injury).Speech is slightly slurred. Naming is grossly intact. Attention span and concentration are within normal limits.   Psych: The patient is oriented to time, place and person. Behavior appropriate.  Affect normal, pleasant, joking.  Recall, recent and remote, as well as general fund of knowledge seem within normal limits. No evidence of aural or visual hallucinations or delusions.       Labs on Admission:  I have personally reviewed following labs and imaging studies: CBC:  Recent Labs Lab 07/29/16 1800 07/30/16 0637 07/31/16 0510  08/01/16 1734  WBC 27.1* 27.3* 24.9* 30.1*  NEUTROABS 6.8  --   --   --   HGB 10.1* 10.9* 10.4* 11.1*  HCT 32.9* 35.6* 34.9* 37.0  MCV 83.7 84.6 83.9 85.3  PLT 392 407* 348 [id number]*   Basic Metabolic  Panel:  Recent Labs Lab 07/29/16 1800 07/31/16 0510 08/01/16 1734  NA 139 136 131*  K 3.6 4.2 4.5  CL 105 102 97*  CO2 21* 26 22  GLUCOSE 86 235* 310*  BUN 36* 23* 28*  CREATININE 0.77 0.81 1.03*  CALCIUM 9.6 9.2 9.1   GFR: Estimated Creatinine Clearance: 54.5 mL/min (by C-G formula based on SCr of 1.03 mg/dL (H)). Liver Function Tests:  Recent Labs Lab 07/29/16 1800  AST 28  ALT 21  ALKPHOS 54  BILITOT 0.4  PROT 7.3  ALBUMIN 3.7   No results for input(s): LIPASE, AMYLASE in the last 168 hours. No results for input(s): AMMONIA in the last 168 hours. Coagulation Profile:  Recent Labs Lab 07/29/16 1800  INR 0.98   Cardiac Enzymes:  Recent Labs Lab 07/29/16 1800  TROPONINI <0.03   BNP (last 3 results) No results for input(s): PROBNP in the last 8760 hours. HbA1C:  Recent Labs  07/30/16 0637  HGBA1C 8.3*   CBG:  Recent Labs Lab 07/31/16 1147 07/31/16 1745 07/31/16 2134 08/01/16 0806 08/01/16 1245  GLUCAP 249* 239* 205* 189* 189*   Lipid Profile:  Recent Labs  07/30/16 0637  CHOL 202*  HDL 28*  LDLCALC 122*  TRIG 259*  CHOLHDL 7.2   Thyroid Function Tests: No results for input(s): TSH, T4TOTAL, FREET4, T3FREE, THYROIDAB in the last 72 hours. Anemia Panel: No results for input(s): VITAMINB12, FOLATE, FERRITIN, TIBC, IRON, RETICCTPCT in the last 72 hours. Sepsis Labs: Invalid input(s): PROCALCITONIN, LACTICIDVEN Recent Results (from the past 240 hour(s))  Urine culture     Status: Abnormal   Collection Time: 07/29/16  7:30 PM  Result Value Ref Range Status   Specimen Description   Final    URINE, RANDOM Performed at [facility] Requests NONE  Final   Culture >=100,000 COLONIES/mL KLEBSIELLA PNEUMONIAE (A)   Final   Report Status 08/01/2016 FINAL  Final   Organism ID, Bacteria KLEBSIELLA PNEUMONIAE (A)  Final      Susceptibility   Klebsiella pneumoniae - MIC*    AMPICILLIN >=32 RESISTANT Resistant     CEFAZOLIN <=4 SENSITIVE Sensitive     CEFTRIAXONE <=1 SENSITIVE Sensitive     CIPROFLOXACIN <=0.25 SENSITIVE Sensitive     GENTAMICIN <=1 SENSITIVE Sensitive     IMIPENEM <=0.25 SENSITIVE Sensitive     NITROFURANTOIN 64 INTERMEDIATE Intermediate     TRIMETH/SULFA <=20 SENSITIVE Sensitive     AMPICILLIN/SULBACTAM 4 SENSITIVE Sensitive     PIP/TAZO <=4 SENSITIVE Sensitive     Extended ESBL NEGATIVE Sensitive     * >=100,000 COLONIES/mL KLEBSIELLA PNEUMONIAE      Radiological Exams on Admission: Personally reviewed following report: Ct Head Wo Contrast  Result Date: 08/01/2016 CLINICAL DATA:  Subacute onset of left-sided weakness. Initial encounter. EXAM: CT HEAD WITHOUT CONTRAST TECHNIQUE: Contiguous axial images were obtained from the base of the skull through the vertex without intravenous contrast. COMPARISON:  MRI of the brain performed 07/30/2016, and CT of the head performed 07/29/2016 FINDINGS: Brain: There is a new evolving acute infarct at the medial distribution of the right anterior cerebral artery, along the right frontal lobe. This is much larger than the previously noted small punctate infarcts in this region. Mild associated mass effect is noted, without evidence of significant midline shift. Prominence of the ventricles and sulci reflects mild cortical volume loss. There is no evidence of hemorrhagic transformation, hydrocephalus or mass lesion. The brainstem and fourth ventricle are  within normal limits. The basal ganglia are unremarkable in appearance. Vascular: No hyperdense vessel or unexpected calcification. Skull: There is no evidence of fracture; visualized osseous structures are unremarkable in appearance. Sinuses/Orbits: The orbits are within normal limits. The paranasal  sinuses and mastoid air cells are well-aerated. Other: No significant soft tissue abnormalities are seen. IMPRESSION: 1. 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 This is much larger than the previously noted small punctate infarcts in this region. Mild associated mass effect, without significant midline shift. 2. Mild cortical volume loss noted. These results were called by telephone at the time of interpretation on 08/01/2016 at 9:32 pm to [facility] PA, who verbally acknowledged these results. Electronically Signed   By: Garald Balding M.D.   On: 08/01/2016 21:33     EKG: Independently reviewed. Rate 94, QTc 457, sinus rhythm, no ST changes, poor R wave progresion.    Echocardiogram Sep 2017: [id number] Grade I diastolic dysfunction No PFO No significant valvular disease    Assessment/Plan 1. 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  The ACA segment occluded on this week's MRI appears to have infarcted fully, anticipate this is extent of her stroke at this time. -Admit to telemetry -Neuro checks, NIHSS per protocol -Continue Plavix and statin -Permissive hypertension for now -Carotid doppler pending from two days ago -PT/OT/SLP consultation -Consult to Neurology, appreciate recommendations   2. IDDM:  HgbA1c 8.3%  -Hold home orals -Continue Lantus -SSI with meals  3. HTN:  -Permissive hypertension for now, hold amlodipine, losartan, HCTZ  4. Anemia:  Stable  5. Leukocytosis:  Thought to be from prednisone use during last hospitalization.  Persists now.  Has outpatient Heme-Onc follow up.  6. Asymptomatic bacteriuria:  Cipro susceptible Klebsiella -Continue cipro for 2 more days  7. Other medications:  -Continue gemfibrozil -Continue ranitidine -Continue Mirapex -Continue oxybutynin      DVT prophylaxis: Lovenox  Code Status: FULL  Family Communication: Daughter at bedside, Niece, great  niece and best friend also present.  Overnight plan discussed, all questions answered.  Disposition Plan: Anticipate Stroke work up as above and consult to ancillary services.  Expect discharge within 2-3 days likely to SNF for rehab. Consults called: Neurology, Dr. Leonel Ramsay will see patient. Admission status: Telemetry, INPATIENT status  Core measures: -VTE prophylaxis ordered at time of admission -Plavix ordered at admission -Atrial fibrillation: not present -tPA not given because of outside stroke window -Dysphagia screen ordered in ER -Lipids ordered during last 2 days -PT eval ordered     Medical decision making: Patient seen at 10:30 PM on 08/01/2016.  The patient was discussed with Quincy Carnes, PA-C. What exists of the patient's chart was reviewed in depth and summarized above.  Clinical condition: stable.       Edwin Dada [facility] Pager [phone]

## 2016-08-02 ENCOUNTER — Inpatient Hospital Stay (HOSPITAL_COMMUNITY): Payer: Medicare Other

## 2016-08-02 DIAGNOSIS — M1990 Unspecified osteoarthritis, unspecified site: Secondary | ICD-10-CM

## 2016-08-02 DIAGNOSIS — I639 Cerebral infarction, unspecified: Secondary | ICD-10-CM

## 2016-08-02 DIAGNOSIS — I10 Essential (primary) hypertension: Secondary | ICD-10-CM

## 2016-08-02 DIAGNOSIS — D649 Anemia, unspecified: Secondary | ICD-10-CM

## 2016-08-02 DIAGNOSIS — D72829 Elevated white blood cell count, unspecified: Secondary | ICD-10-CM

## 2016-08-02 DIAGNOSIS — I63521 Cerebral infarction due to unspecified occlusion or stenosis of right anterior cerebral artery: Principal | ICD-10-CM

## 2016-08-02 DIAGNOSIS — N390 Urinary tract infection, site not specified: Secondary | ICD-10-CM

## 2016-08-02 DIAGNOSIS — Z794 Long term (current) use of insulin: Secondary | ICD-10-CM

## 2016-08-02 DIAGNOSIS — E119 Type 2 diabetes mellitus without complications: Secondary | ICD-10-CM

## 2016-08-02 DIAGNOSIS — E785 Hyperlipidemia, unspecified: Secondary | ICD-10-CM

## 2016-08-02 LAB — CBC WITH DIFFERENTIAL/PLATELET
BASOS ABS: 0 10*3/uL (ref 0.0–0.1)
BASOS PCT: 0 %
Band Neutrophils: 0 %
Blasts: 0 %
EOS PCT: 0 %
Eosinophils Absolute: 0 10*3/uL (ref 0.0–0.7)
HCT: 33.7 % — ABNORMAL LOW (ref 36.0–46.0)
Hemoglobin: 10.4 g/dL — ABNORMAL LOW (ref 12.0–15.0)
LYMPHS ABS: 17.8 10*3/uL — AB (ref 0.7–4.0)
Lymphocytes Relative: 70 %
MCH: 25.9 pg — AB (ref 26.0–34.0)
MCHC: 30.9 g/dL (ref 30.0–36.0)
MCV: 84 fL (ref 78.0–100.0)
METAMYELOCYTES PCT: 0 %
MONO ABS: 3.3 10*3/uL — AB (ref 0.1–1.0)
MONOS PCT: 13 %
MYELOCYTES: 0 %
NEUTROS ABS: 4.3 10*3/uL (ref 1.7–7.7)
Neutrophils Relative %: 17 %
Other: 0 %
PLATELETS: 354 10*3/uL (ref 150–400)
Promyelocytes Absolute: 0 %
RBC: 4.01 MIL/uL (ref 3.87–5.11)
RDW: 15.1 % (ref 11.5–15.5)
WBC: 25.4 10*3/uL — AB (ref 4.0–10.5)
nRBC: 0 /100 WBC

## 2016-08-02 LAB — COMPREHENSIVE METABOLIC PANEL
ALT: 33 U/L (ref 14–54)
AST: 32 U/L (ref 15–41)
Albumin: 3.5 g/dL (ref 3.5–5.0)
Alkaline Phosphatase: 69 U/L (ref 38–126)
Anion gap: 12 (ref 5–15)
BUN: 23 mg/dL — ABNORMAL HIGH (ref 6–20)
CHLORIDE: 97 mmol/L — AB (ref 101–111)
CO2: 22 mmol/L (ref 22–32)
Calcium: 8.7 mg/dL — ABNORMAL LOW (ref 8.9–10.3)
Creatinine, Ser: 0.85 mg/dL (ref 0.44–1.00)
Glucose, Bld: 307 mg/dL — ABNORMAL HIGH (ref 65–99)
POTASSIUM: 4.2 mmol/L (ref 3.5–5.1)
SODIUM: 131 mmol/L — AB (ref 135–145)
Total Bilirubin: 0.5 mg/dL (ref 0.3–1.2)
Total Protein: 6.9 g/dL (ref 6.5–8.1)

## 2016-08-02 LAB — MAGNESIUM: MAGNESIUM: 2 mg/dL (ref 1.7–2.4)

## 2016-08-02 LAB — GLUCOSE, CAPILLARY
GLUCOSE-CAPILLARY: 357 mg/dL — AB (ref 65–99)
GLUCOSE-CAPILLARY: 263 mg/dL — AB (ref 65–99)
GLUCOSE-CAPILLARY: 263 mg/dL — AB (ref 65–99)
Glucose-Capillary: 177 mg/dL — ABNORMAL HIGH (ref 65–99)
Glucose-Capillary: 308 mg/dL — ABNORMAL HIGH (ref 65–99)

## 2016-08-02 LAB — PHOSPHORUS: Phosphorus: 2.6 mg/dL (ref 2.5–4.6)

## 2016-08-02 MED ORDER — SENNOSIDES-DOCUSATE SODIUM 8.6-50 MG PO TABS: 1.0000 | ORAL_TABLET | Freq: Every evening | ORAL | Status: DC | PRN

## 2016-08-02 MED ORDER — CLOPIDOGREL BISULFATE 75 MG PO TABS
75.0000 mg | ORAL_TABLET | Freq: Every day | ORAL | Status: DC
  Administered 2016-08-02 – 2016-08-04 (×3): 75 mg via ORAL
  Filled 2016-08-02 (×3): qty 1

## 2016-08-02 MED ORDER — GEMFIBROZIL 600 MG PO TABS
600.0000 mg | ORAL_TABLET | Freq: Two times a day (BID) | ORAL | Status: DC
  Administered 2016-08-02 – 2016-08-04 (×6): 600 mg via ORAL
  Filled 2016-08-02 (×6): qty 1

## 2016-08-02 MED ORDER — CIPROFLOXACIN HCL 500 MG PO TABS
500.0000 mg | ORAL_TABLET | Freq: Two times a day (BID) | ORAL | Status: DC
  Administered 2016-08-02 – 2016-08-03 (×4): 500 mg via ORAL
  Filled 2016-08-02 (×4): qty 1

## 2016-08-02 MED ORDER — OXYBUTYNIN CHLORIDE ER 10 MG PO TB24
10.0000 mg | ORAL_TABLET | Freq: Every day | ORAL | Status: DC
  Administered 2016-08-02 – 2016-08-04 (×3): 10 mg via ORAL
  Filled 2016-08-02 (×3): qty 1

## 2016-08-02 MED ORDER — CITALOPRAM HYDROBROMIDE 20 MG PO TABS
20.0000 mg | ORAL_TABLET | Freq: Every day | ORAL | Status: DC
  Administered 2016-08-02 – 2016-08-04 (×3): 20 mg via ORAL
  Filled 2016-08-02 (×3): qty 1

## 2016-08-02 MED ORDER — PRAVASTATIN SODIUM 40 MG PO TABS
80.0000 mg | ORAL_TABLET | Freq: Every day | ORAL | Status: DC
  Administered 2016-08-02 – 2016-08-04 (×3): 80 mg via ORAL
  Filled 2016-08-02 (×3): qty 2

## 2016-08-02 MED ORDER — PANTOPRAZOLE SODIUM 40 MG PO TBEC
40.0000 mg | DELAYED_RELEASE_TABLET | Freq: Every day | ORAL | Status: DC
  Administered 2016-08-02 – 2016-08-04 (×3): 40 mg via ORAL
  Filled 2016-08-02 (×3): qty 1

## 2016-08-02 MED ORDER — PRAMIPEXOLE DIHYDROCHLORIDE 0.125 MG PO TABS
0.2500 mg | ORAL_TABLET | Freq: Every day | ORAL | Status: DC
  Administered 2016-08-02 – 2016-08-04 (×3): 0.25 mg via ORAL
  Filled 2016-08-02 (×3): qty 2

## 2016-08-02 MED ORDER — INSULIN GLARGINE 100 UNIT/ML ~~LOC~~ SOLN
70.0000 [IU] | Freq: Every day | SUBCUTANEOUS | Status: DC
  Administered 2016-08-02 – 2016-08-03 (×3): 70 [IU] via SUBCUTANEOUS
  Filled 2016-08-02 (×4): qty 0.7

## 2016-08-02 MED ORDER — SODIUM CHLORIDE 0.9 % IV SOLN: INTRAVENOUS | Status: DC

## 2016-08-02 MED ORDER — INSULIN ASPART 100 UNIT/ML ~~LOC~~ SOLN
0.0000 [IU] | Freq: Every day | SUBCUTANEOUS | Status: DC
  Administered 2016-08-02: 5 [IU] via SUBCUTANEOUS
  Administered 2016-08-02: 4 [IU] via SUBCUTANEOUS
  Administered 2016-08-03: 5 [IU] via SUBCUTANEOUS

## 2016-08-02 MED ORDER — FAMOTIDINE 20 MG PO TABS
20.0000 mg | ORAL_TABLET | Freq: Every day | ORAL | Status: DC
  Administered 2016-08-02 – 2016-08-03 (×3): 20 mg via ORAL
  Filled 2016-08-02 (×3): qty 1

## 2016-08-02 MED ORDER — ENOXAPARIN SODIUM 40 MG/0.4ML ~~LOC~~ SOLN
40.0000 mg | Freq: Every day | SUBCUTANEOUS | Status: DC
Start: 2016-08-02 — End: 2016-08-04
  Administered 2016-08-02 – 2016-08-04 (×3): 40 mg via SUBCUTANEOUS
  Filled 2016-08-02 (×3): qty 0.4

## 2016-08-02 MED ORDER — INSULIN ASPART 100 UNIT/ML ~~LOC~~ SOLN
0.0000 [IU] | Freq: Three times a day (TID) | SUBCUTANEOUS | Status: DC
  Administered 2016-08-02: 8 [IU] via SUBCUTANEOUS
  Administered 2016-08-02: 3 [IU] via SUBCUTANEOUS
  Administered 2016-08-02: 8 [IU] via SUBCUTANEOUS
  Administered 2016-08-03: 3 [IU] via SUBCUTANEOUS
  Administered 2016-08-03: 8 [IU] via SUBCUTANEOUS
  Administered 2016-08-03 – 2016-08-04 (×2): 5 [IU] via SUBCUTANEOUS
  Administered 2016-08-04: 3 [IU] via SUBCUTANEOUS

## 2016-08-02 MED ORDER — ACETAMINOPHEN 325 MG PO TABS
650.0000 mg | ORAL_TABLET | Freq: Three times a day (TID) | ORAL | Status: DC
Start: 2016-08-02 — End: 2016-08-04
  Administered 2016-08-02 – 2016-08-04 (×11): 650 mg via ORAL
  Filled 2016-08-02 (×11): qty 2

## 2016-08-02 MED ORDER — SODIUM CHLORIDE 0.9 % IV SOLN: INTRAVENOUS | Status: AC

## 2016-08-02 MED ORDER — STROKE: EARLY STAGES OF RECOVERY BOOK
Freq: Once | Status: AC
  Administered 2016-08-02: 02:00:00
  Filled 2016-08-02: qty 1

## 2016-08-02 NOTE — Progress Notes (Signed)
Admitted via ED stretcher, no c/o pain, A&O x 4, oriented to room & safety precautions.  Family at bedside.  Tele applied & verified per central monitoring.

## 2016-08-02 NOTE — Evaluation (Signed)
Physical Therapy Evaluation Patient Details Name: JERALDENE HAMES MRN: [id number] DOB: December 21, 1940 Today's Date: 08/02/2016   History of Present Illness  pt is a 75 y/o female with ;mh significant for HTN, IDDM who presents with left sided weakness.  MRI shows large R ACA territory infarct.  Clinical Impression  Pt admitted with/for left sided weakness, s/p R ACA infarct.  Pt currently limited functionally due to the problems listed below.  (see problems list.)  Pt will benefit from PT to maximize function and safety to be able to get home safely with available assist of family.  Pt is a min assist level in general at this time..     Follow Up Recommendations Home health PT    Equipment Recommendations  None recommended by PT    Recommendations for Other Services       Precautions / Restrictions Precautions Precautions: Fall      Mobility  Bed Mobility Overal bed mobility: Needs Assistance Bed Mobility: Supine to Sit;Sit to Supine   Sidelying to sit: Min guard Supine to sit: Min guard     General bed mobility comments: extra time, no assist needed  Transfers Overall transfer level: Needs assistance   Transfers: Sit to/from Stand Sit to Stand: Min assist         General transfer comment: cues for hand placement, stability assist  Ambulation/Gait Ambulation/Gait assistance: Min assist Ambulation Distance (Feet): 200 Feet Assistive device: Rolling walker (2 wheeled);1 person hand held assist Gait Pattern/deviations: Step-through pattern;Decreased step length - left;Decreased stance time - left Gait velocity: slower Gait velocity interpretation: Below normal speed for age/gender General Gait Details: steady with RW, but with drift L.  More unsteady without device, more touble clearing L foot and more "wobble" with w/shifting as she fatigued.  Stairs            Wheelchair Mobility    Modified Rankin (Stroke Patients Only) Modified Rankin (Stroke Patients  Only) Pre-Morbid Rankin Score: No significant disability Modified Rankin: Moderately severe disability     Balance Overall balance assessment: Needs assistance   Sitting balance-Leahy Scale: Fair Sitting balance - Comments: begins to list L almost immediately, but maintains balance without assist   Standing balance support: No upper extremity supported Standing balance-Leahy Scale: Fair Standing balance comment: less list left than in sitting                             Pertinent Vitals/Pain Pain Assessment: Faces Faces Pain Scale: Hurts little more Pain Location: R hip Pain Descriptors / Indicators: Sore Pain Intervention(s): Monitored during session;Repositioned    Home Living Family/patient expects to be discharged to:: Private residence Living Arrangements: Children (lives with daughter) Available Help at Discharge: Family Type of Home: House Home Access: Stairs to enter Entrance Stairs-Rails: Psychiatric nurse of Steps: 3 Home Layout: One level        Prior Function Level of Independence: Independent               Hand Dominance        Extremity/Trunk Assessment   Upper Extremity Assessment: Defer to OT evaluation (generally weak grip and gross flexion/ext, but isolated mvmt)           Lower Extremity Assessment:  (L LE moves in synergistic pattern, but pt able to isolate)         Communication   Communication: No difficulties  Cognition Arousal/Alertness: Awake/alert Behavior During Therapy: [facility] for  tasks assessed/performed;Flat affect Overall Cognitive Status: Within Functional Limits for tasks assessed                      General Comments      Exercises     Assessment/Plan    PT Assessment Patient needs continued PT services  PT Problem List Decreased balance;Decreased strength;Decreased mobility;Decreased coordination          PT Treatment Interventions Gait training;Stair  training;Functional mobility training;Therapeutic activities;Balance training;Patient/family education;DME instruction    PT Goals (Current goals can be found in the Care Plan section)  Acute Rehab PT Goals Patient Stated Goal: i want to be independent PT Goal Formulation: With patient Time For Goal Achievement: 08/16/16 Potential to Achieve Goals: Good    Frequency Min 3X/week   Barriers to discharge        Co-evaluation               End of Session   Activity Tolerance: Patient tolerated treatment well Patient left: with call bell/phone within reach;in chair;with chair alarm set Nurse Communication: Mobility status         Time: [id number] PT Time Calculation (min) (ACUTE ONLY): 27 min   Charges:   PT Evaluation $PT Eval Moderate Complexity: 1 Procedure PT Treatments $Gait Training: 8-22 mins   PT G Codes:        Caralina Nop, [name] 08/02/2016, 2:14 PM 08/02/2016  Donnella Sham, PT [phone]  (pager)

## 2016-08-02 NOTE — Progress Notes (Signed)
PROGRESS NOTE    Theresa Kramer  [id number] DOB: Apr 18, 1941 DOA: 08/01/2016 PCP: Asencion Noble, MD  Brief Narrative:  Theresa Kramer is a 75 y.o. female with a past medical history significant for HTN, IDDM who presents with left sided weakness.She was also admitted 2 days ago with nonspecific abnormal behaviors thought initially to be related to TIA versus delirium from some underlying neurologic stressor. MRI/MRA were obtained which showed some punctate findings in the right ACA distribution, ultimately thought by stroke team not to be infarction, and the patient was treated for a UTI, and hydrated.  Echocardiogram was obtained that was normal.  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 Today's MRI showed right ACA territory infarct has developed since 07/30/2016 and Stroke Team is following.   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 Evolving Large Acute CVA at the Medial Distribution of the R Anterior Cerebral Artery along the Frontal Lobes likely 2/2 to Failure of Collaterals with subsequent Ischemia and Right A2 Occulsion.  2. Hyperglycemia in the setting of Insulin Dependent Diabetes Mellitus Type 2 3. Leukocytosis in the Setting of Prednisone use 4. Mild Hyponatremia 5 Hypertension 6. Hyperlipdemia 7. GERD 8. OAB 9. Restless Leg Syndrome 10. Depression 11. Previous UTI  PLAN  1. Evolving Large Acute CVA at the Medial Distribution of the R Anterior Cerebral Artery along the Frontal Lobes likely 2/2 to Failure of Collaterals with subsequent Ischemia and Right A2 Occulsion.  -Neurology Stroke Team Consulted and  Appreciate Recommendations. Discussed Case with Dr. Erlinda Hong -CT Head showed New evolving acute infarct at the medial distribution of the right anterior cerebral artery along the right frontal lobe. This is much larger than the previously noted small punctate infarcts in this region. Mild associated mass effect, without significant midline shift.  Mild cortical volume loss noted.  -MRI of Brain showed Large right ACA territory infarct has developed since 07/30/2016. No evidence of hemorrhagic transformation. No significant intracranial mass effect. No other vascular territory ischemia identified. -Carotid Dopplers Done 07/30/16 -Transthoracic Echocardiogram done yesterday showed Left ventricle: The cavity size was normal. Wall thickness was increased in a pattern of moderate LVH. Systolic function was   normal. The estimated ejection fraction was in the range of 60% to 65%. Wall motion was normal; there were no regional wall motion abnormalities. Doppler parameters are consistent with abnormal left ventricular relaxation (grade 1 diastolic dysfunction). -Lipid Panel showed Cholesterol of 202, HDL of 28, LDL of 122, TG of 259 -HbA1c 8.3% -Plavix 75 mg po Daily and Pravastatin 80 mg po qHS -PT/OT/SLP Evaluate and treat - PT recommends Home PT -Neurochecks per protocol -Continue with Telemetry -Allow for Permissive HTN  2. Hyperglycemia in the setting of Insulin Dependent Diabetes Mellitus Type 2 -Heart Healthy/Carb Modified Diet -C/w Novolog Moderate SSI AC and HS correction; May need to increase to Resistant Scale -C/w Lantus 70 units sq at bedtime -HbA1c 8.3 -Hypoglycemia Protocol in Place  3. Leukocytosis  -Patients WBC went from 24.9 -> 30.1 -> 25.4 -Possibly from Prednisone however patient had Atypical Lymphocytes -Will obtain a Peripheral Smear in AM and Repeat CBC -Patient had outpatient Appointment with Heme/Onc -  Will consider Inpatient Heme/Onc Consult if not improving.   4. Mild  Hyponatremia -Patients' Na+ was 131 -Was Bolused 500 mL of NS yesterday -Repeat CMP in AM  5. Hypertension -Allow for Permissive HTN. BP has been ranging from 141-202 SBP and 89-135 -Hold Home Antihypertensive Medications.   6. Hyperlipidemia -Lipid Panel showed Cholesterol of 202, HDL of 28, LDL of 122, TG of 259 -Patient was started on Pravastatin 80 mg po qHS and Gemfibrozil 600 mg po BID  7. GERD -Continue with Famotidine 20 mg po qHS and with Pantoprazole 40 mg po ACB  8. OAB -Continue with Oxybutynin 10 mg po ACB  9. Restless Leg Syndrome -Continue with Pramipexole 0.25  10. Depression -C/w Citalopram 20 mg po Daily  11. Previous UTI -UTI on previous Admission/Discharge -Urine Cx -C/w Ciprofloxacin 500 mg BID  DVT prophylaxis: Lovenox Code Status: Full Family Communication: Discussed Case with patient. Patient's Daughter was in there room but left when plan of care was discussed. Disposition Plan: Home with Home Health PT  Consultants:   Neurology Stroke Team  Procedures:   Antimicrobials: Ciprofloxacin 500 mg   Subjective: Patient was seen and examined at bedside. She was a little confused. Denied any N/V/Abdominal Pain. Had no CP/SOB or Lightheadedness or dizziness. No other complaints.   Objective: Vitals:   08/01/16 2230 08/01/16 2245 08/02/16 0000 08/02/16 0200  BP: 123/60 129/56 113/76 (!) 132/54  Pulse: 73 84 77   Resp: 21 22 20 20   Temp:   98.6 F (37 C) 98.6 F (37 C)  TempSrc:   Oral Oral  SpO2: 95% 95% 98% 98%  Weight:   99.1 kg (218 lb 6.4 oz)   Height:   5\' 4"  (1.626 m)    No intake or output data in the 24 hours ending 08/02/16 0741 Filed Weights   08/02/16 0000  Weight: 99.1 kg (218 lb 6.4 oz)    Examination: Physical Exam:  Constitutional: WN/WD obese female, NAD and appears calm and comfortable Eyes:  Lids and conjunctivae normal, sclerae anicteric  ENMT: External Ears, Nose appear normal. Grossly normal hearing.  Neck:  Appears normal, supple, no cervical masses, normal ROM, no appreciable thyromegaly, no JVD Respiratory: Clear to auscultation bilaterally, no wheezing, rales, rhonchi or crackles. Normal respiratory effort and patient is not tachypenic. No accessory muscle use.  Cardiovascular: RRR, no murmurs / rubs / gallops. S1 and S2 auscultated. No extremity edema. 2+ pedal pulses.  Abdomen: Soft, non-tender, non-distended. No masses palpated. No appreciable hepatosplenomegaly. Bowel sounds positive x4.  GU: Deferred. Musculoskeletal: No clubbing / cyanosis of digits/nails. No joint deformity upper and lower extremities. No contractures. Normal muscle tone.  Skin: No rashes, lesions, ulcers. No induration; Warm and dry.  Neurologic: CN 2-12 grossly intact with no focal deficits. Sensation intact in all 4 Extremities, Strength 4/5 in LUE and LLE. Romberg sign cerebellar reflexes not assessed.  Psychiatric: Normal judgment and insight. Alert and oriented x 3. Normal mood and flat affect.   Data Reviewed: I have reviewed the following.   CBC:  Recent Labs Lab 07/29/16 1800 07/30/16 0637 07/31/16 0510 08/01/16 1734  WBC 27.1* 27.3* 24.9* 30.1*  NEUTROABS 6.8  --   --   --   HGB 10.1* 10.9* 10.4* 11.1*  HCT 32.9* 35.6* 34.9* 37.0  MCV 83.7 84.6 83.9 85.3  PLT 392 407* 348 [id number]*   Basic Metabolic Panel:  Recent Labs Lab 07/29/16 1800 07/31/16 0510 08/01/16 1734  NA 139 136 131*  K 3.6  4.2 4.5  CL 105 102 97*  CO2 21* 26 22  GLUCOSE 86 235* 310*  BUN 36* 23* 28*  CREATININE 0.77 0.81 1.03*  CALCIUM 9.6 9.2 9.1   GFR: Estimated Creatinine Clearance: 54 mL/min (by C-G formula based on SCr of 1.03 mg/dL (H)). Liver Function Tests:  Recent Labs Lab 07/29/16 1800  AST 28  ALT 21  ALKPHOS 54  BILITOT 0.4  PROT 7.3  ALBUMIN 3.7   No results for input(s): LIPASE, AMYLASE in the last 168 hours. No results for input(s): AMMONIA in the last 168 hours. Coagulation Profile:  Recent  Labs Lab 07/29/16 1800  INR 0.98   Cardiac Enzymes:  Recent Labs Lab 07/29/16 1800  TROPONINI <0.03   BNP (last 3 results) No results for input(s): PROBNP in the last 8760 hours. HbA1C: No results for input(s): HGBA1C in the last 72 hours. CBG:  Recent Labs Lab 07/31/16 2134 08/01/16 0806 08/01/16 1245 08/02/16 0143 08/02/16 0606  GLUCAP 205* 189* 189* 357* 263*   Lipid Profile: No results for input(s): CHOL, HDL, LDLCALC, TRIG, CHOLHDL, LDLDIRECT in the last 72 hours. Thyroid Function Tests: No results for input(s): TSH, T4TOTAL, FREET4, T3FREE, THYROIDAB in the last 72 hours. Anemia Panel: No results for input(s): VITAMINB12, FOLATE, FERRITIN, TIBC, IRON, RETICCTPCT in the last 72 hours. Sepsis Labs: No results for input(s): PROCALCITON, LATICACIDVEN in the last 168 hours.  Recent Results (from the past 240 hour(s))  Urine culture     Status: Abnormal   Collection Time: 07/29/16  7:30 PM  Result Value Ref Range Status   Specimen Description   Final    URINE, RANDOM Performed at [facility] Requests NONE  Final   Culture >=100,000 COLONIES/mL KLEBSIELLA PNEUMONIAE (A)  Final   Report Status 08/01/2016 FINAL  Final   Organism ID, Bacteria KLEBSIELLA PNEUMONIAE (A)  Final      Susceptibility   Klebsiella pneumoniae - MIC*    AMPICILLIN >=32 RESISTANT Resistant     CEFAZOLIN <=4 SENSITIVE Sensitive     CEFTRIAXONE <=1 SENSITIVE Sensitive     CIPROFLOXACIN <=0.25 SENSITIVE Sensitive     GENTAMICIN <=1 SENSITIVE Sensitive     IMIPENEM <=0.25 SENSITIVE Sensitive     NITROFURANTOIN 64 INTERMEDIATE Intermediate     TRIMETH/SULFA <=20 SENSITIVE Sensitive     AMPICILLIN/SULBACTAM 4 SENSITIVE Sensitive     PIP/TAZO <=4 SENSITIVE Sensitive     Extended ESBL NEGATIVE Sensitive     * >=100,000 COLONIES/mL KLEBSIELLA PNEUMONIAE    Radiology Studies: Ct Head Wo Contrast  Result Date: 08/01/2016 CLINICAL DATA:  Subacute onset of left-sided  weakness. Initial encounter. EXAM: CT HEAD WITHOUT CONTRAST TECHNIQUE: Contiguous axial images were obtained from the base of the skull through the vertex without intravenous contrast. COMPARISON:  MRI of the brain performed 07/30/2016, and CT of the head performed 07/29/2016 FINDINGS: Brain: There is a new evolving acute infarct at the medial distribution of the right anterior cerebral artery, along the right frontal lobe. This is much larger than the previously noted small punctate infarcts in this region. Mild associated mass effect is noted, without evidence of significant midline shift. Prominence of the ventricles and sulci reflects mild cortical volume loss. There is no evidence of hemorrhagic transformation, hydrocephalus or mass lesion. The brainstem and fourth ventricle are within normal limits. The basal ganglia are unremarkable in appearance. Vascular: No hyperdense vessel or unexpected calcification. Skull: There is no evidence of fracture; visualized osseous structures  are unremarkable in appearance. Sinuses/Orbits: The orbits are within normal limits. The paranasal sinuses and mastoid air cells are well-aerated. Other: No significant soft tissue abnormalities are seen. IMPRESSION: 1. 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 This is much larger than the previously noted small punctate infarcts in this region. Mild associated mass effect, without significant midline shift. 2. Mild cortical volume loss noted. These results were called by telephone at the time of interpretation on 08/01/2016 at 9:32 pm to [facility] PA, who verbally acknowledged these results. Electronically Signed   By: Garald Balding M.D.   On: 08/01/2016 21:33   ECHOCARDIOGRAM 08/01/16: -Left ventricle: The cavity size was normal. Wall thickness was   increased in a pattern of moderate LVH. Systolic function was   normal. The estimated ejection  fraction was in the range of 60%   to 65%. Wall motion was normal; there were no regional wall   motion abnormalities. Doppler parameters are consistent with   abnormal left ventricular relaxation (grade 1 diastolic   dysfunction). - Mitral valve: Calcified annulus. Moderately thickened leaflets . - Left atrium: The atrium was mildly dilated. - Atrial septum: No defect or patent foramen ovale was identified. - Pulmonary arteries: PA peak pressure: 35 mm Hg (S).  Scheduled Meds: . acetaminophen  650 mg Oral TID AC & HS  . ciprofloxacin  500 mg Oral BID  . citalopram  20 mg Oral Daily  . clopidogrel  75 mg Oral Daily  . enoxaparin (LOVENOX) injection  40 mg Subcutaneous Daily  . famotidine  20 mg Oral QHS  . gemfibrozil  600 mg Oral BID AC  . insulin aspart  0-15 Units Subcutaneous TID WC  . insulin aspart  0-5 Units Subcutaneous QHS  . insulin glargine  70 Units Subcutaneous QHS  . oxybutynin  10 mg Oral Daily  . pantoprazole  40 mg Oral Daily  . pramipexole  0.25 mg Oral Daily  . pravastatin  80 mg Oral Daily   Continuous Infusions: . sodium chloride      LOS: 1 day   Kerney Elbe, DO [facility] Pager [phone]  If 7PM-7AM, please contact night-coverage [email or web address] Password [email or web address] 08/02/2016, 7:41 AM

## 2016-08-02 NOTE — Consult Note (Signed)
Neurology Consultation Reason for Consult: Abnormal behavior Referring Physician: Quincy Carnes  CC: Abnormal behaviors  History is obtained from: Daughter  HPI: Theresa Kramer is a 75 y.o. female who was recently admitted with an A2 occlusion and punctate strokes in this distribution. She had symptoms similar to what she has currently at the onset previously, but rapidly improved. Now, her daughter describes that she seems to be confused, and when she took her home she had the same behaviors that she had had at the onset of her previous stroke though this time the behaviors were persistent. Specifically she does not use her left side correctly, and does not always move it the way that she is supposed to.   LKW: 4:30 PM 9/26 tpa given?: no, outside of window    ROS: A 14 point ROS was performed and is negative except as noted in the HPI.  Past Medical History:  Diagnosis Date  . Arthritis   . B12 deficiency   . Bronchitis    hx of  . Complication of anesthesia   . Depression    "not taking medication"  . Diabetes mellitus   . GERD (gastroesophageal reflux disease)   . H/O hiatal hernia   . Hyperlipidemia   . Hypertension   . Noncompliance with medication regimen   . Pinched cervical nerve root   . PONV (postoperative nausea and vomiting)   . Urinary tract infection    hx of     Family History  Problem Relation Age of Onset  . Cirrhosis Brother     etoh  . Colon cancer Neg Hx      Social History:  reports that she has quit smoking. She has never used smokeless tobacco. She reports that she does not drink alcohol or use drugs.   Exam: Current vital signs: BP 113/76 (BP Location: Right Arm)   Pulse 77   Temp 98.6 F (37 C) (Oral)   Resp 20   Ht 5\' 4"  (1.626 m)   Wt 99.1 kg (218 lb 6.4 oz)   SpO2 98%   BMI 37.49 kg/m  Vital signs in last 24 hours: Temp:  [98.1 F (36.7 C)-98.8 F (37.1 C)] 98.6 F (37 C) (09/30 0000) Pulse Rate:  [66-96] 77 (09/30  0000) Resp:  [17-24] 20 (09/30 0000) BP: (113-153)/(47-92) 113/76 (09/30 0000) SpO2:  [94 %-99 %] 98 % (09/30 0000) Weight:  [99.1 kg (218 lb 6.4 oz)] 99.1 kg (218 lb 6.4 oz) (09/30 0000)   Physical Exam  Constitutional: Appears well-developed and well-nourished.  Psych: Affect appropriate to situation Eyes: No scleral injection HENT: No OP obstrucion Head: Normocephalic.  Cardiovascular: Normal rate and regular rhythm.  Respiratory: Effort normal and breath sounds normal to anterior ascultation GI: Soft.  No distension. There is no tenderness.  Skin: WDI  Neuro: Mental Status: Patient is awake, alert, interactive and appropriate No signs of aphasia or neglect Cranial Nerves: II: Visual Fields are full. Pupils are equal, round, and reactive to light.  III,IV, VI: EOMI without ptosis or diploplia.  V: Facial sensation is symmetric to temperature VII: Facial movement is symmetric.  VIII: hearing is intact to voice X: Uvula elevates symmetrically XI: Shoulder shrug is symmetric. XII: tongue is midline without atrophy or fasciculations.  Motor: Tone is normal. Bulk is normal. She has good strength to confrontation, though with maybe very slight left hemiparesis. She is able to demonstrate combing her hair with her left hand. Sensory: Sensation is symmetric to light touch  and temperature in the arms and legs. Cerebellar: FNF  intact bilaterally      I have reviewed labs in epic and the results pertinent to this consultation are: Mild hyponatremia  I have reviewed the images obtained: CT head-infarct in the right ACA distribution  Impression: 75 year old female who presented previously with right A2 occlusion and punctate infarcts in this distribution. It is unclear if this was acute at the time, but it appears that she has now had failure of collaterals with subsequent ischemia in this distribution.  I would characterize this as completion of her previous stroke as opposed  to a truly new event, and would not change any of the efforts going towards secondary stroke prevention. Therefore I don't think she needs any repeat of the stroke workup that she does have completed.  Recommendations: 1) continue Plavix 2) PT, OT 3) stroke team to follow   Roland Rack, MD [facility] [phone]  If 7pm- 7am, please page neurology on call as listed in [place].

## 2016-08-02 NOTE — Progress Notes (Addendum)
STROKE TEAM PROGRESS NOTE   HISTORY OF PRESENT ILLNESS (per record) Theresa Kramer is a 75 y.o. female who was recently admitted with an A2 occlusion and punctate strokes in this distribution. She had symptoms similar to what she has currently at the onset previously, but rapidly improved. Now, her daughter describes that she seems to be confused, and when she took her home she had the same behaviors that she had had at the onset of her previous stroke though this time the behaviors were persistent. Specifically she does not use her left side correctly, and does not always move it the way that she is supposed to.   LKW: 4:30 PM 9/26 tpa given?: no, outside of window   SUBJECTIVE (INTERVAL HISTORY) Her daughter is at the bedside.  Overall she feels her condition is gradually improving. She has left arm weakness and left hand weakness this time. MRI pending.    OBJECTIVE Temp:  [98.4 F (36.9 C)-98.7 F (37.1 C)] 98.4 F (36.9 C) (09/30 0943) Pulse Rate:  [73-96] 89 (09/30 0943) Cardiac Rhythm: Normal sinus rhythm (09/30 0840) Resp:  [16-24] 18 (09/30 0943) BP: (113-163)/(47-92) 163/68 (09/30 0943) SpO2:  [94 %-98 %] 97 % (09/30 0943) Weight:  [99.1 kg (218 lb 6.4 oz)] 99.1 kg (218 lb 6.4 oz) (09/30 0000)  CBC:  Recent Labs Lab 07/29/16 1800  08/01/16 1734 08/02/16 0823  WBC 27.1*  < > 30.1* 25.4*  NEUTROABS 6.8  --   --  4.3  HGB 10.1*  < > 11.1* 10.4*  HCT 32.9*  < > 37.0 33.7*  MCV 83.7  < > 85.3 84.0  PLT 392  < > 422* 354  < > = values in this interval not displayed.  Basic Metabolic Panel:   Recent Labs Lab 08/01/16 1734 08/02/16 0823  NA 131* 131*  K 4.5 4.2  CL 97* 97*  CO2 22 22  GLUCOSE 310* 307*  BUN 28* 23*  CREATININE 1.03* 0.85  CALCIUM 9.1 8.7*  MG  --  2.0  PHOS  --  2.6    Lipid Panel:     Component Value Date/Time   CHOL 202 (H) 07/30/2016 0637   TRIG 259 (H) 07/30/2016 0637   HDL 28 (L) 07/30/2016 0637   CHOLHDL 7.2 07/30/2016 0637    VLDL 52 (H) 07/30/2016 0637   LDLCALC 122 (H) 07/30/2016 0637   HgbA1c:  Lab Results  Component Value Date   HGBA1C 8.3 (H) 07/30/2016   Urine Drug Screen:     Component Value Date/Time   LABOPIA NONE DETECTED 07/29/2016 1930   COCAINSCRNUR NONE DETECTED 07/29/2016 1930   LABBENZ NONE DETECTED 07/29/2016 1930   AMPHETMU NONE DETECTED 07/29/2016 1930   THCU NONE DETECTED 07/29/2016 1930   LABBARB NONE DETECTED 07/29/2016 1930      IMAGING  Ct Head Wo Contrast 08/01/2016 1. 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 This is much larger than the previously noted small punctate infarcts in this region. Mild associated mass effect, without significant midline shift.  2. Mild cortical volume loss noted.    MRI Brain W/O Cm  1. Large right ACA territory infarct has developed since 07/30/2016. No evidence of hemorrhagic transformation. No significant intracranial mass effect. 2. No other vascular territory ischemia identified.   PHYSICAL EXAM  Temp:  [98.4 F (36.9 C)-98.7 F (37.1 C)] 98.4 F (36.9 C) (09/30 0943) Pulse Rate:  [73-96] 79 (  09/30 1223) Resp:  [16-24] 18 (09/30 1223) BP: (113-163)/(47-107) 136/107 (09/30 1223) SpO2:  [94 %-98 %] 94 % (09/30 1223) Weight:  [218 lb 6.4 oz (99.1 kg)] 218 lb 6.4 oz (99.1 kg) (09/30 0000)  General - Well nourished, well developed, in no apparent distress, mildly restless.  Ophthalmologic - Fundi not visualized due to noncooperation.  Cardiovascular - Regular rate and rhythm.  Mental Status -  Level of arousal and orientation to time, place, and person were intact. Language including expression, naming, repetition, comprehension was assessed and found intact. Attention span and concentration were impaired, able to calculate but wrong on backward spelling. Recent and remote memory were 3/3 registration and 2/3 delayed recall. Fund of Knowledge was  assessed and was able to tell 2/3 previous presidents  Cranial Nerves II - XII - II - Visual field intact OU. III, IV, VI - Extraocular movements intact. V - Facial sensation intact bilaterally. VII - Facial movement intact bilaterally. VIII - Hearing & vestibular intact bilaterally. X - Palate elevates symmetrically. XI - Chin turning & shoulder shrug intact bilaterally. XII - Tongue protrusion intact.  Motor Strength - The patient's strength was normal in all extremities except left arm mild pronator drift and left hand dexterity difficulty with left foot DF 4/5.  Bulk was normal and fasciculations were absent.   Motor Tone - Muscle tone was assessed at the neck and appendages and was normal.  Reflexes - The patient's reflexes were 1+ in all extremities and she had no pathological reflexes.  Sensory - Light touch, temperature/pinprick were assessed and were symmetrical.    Coordination - The patient had normal movements in the hands with no ataxia or dysmetria.  Tremor was absent.  Gait and Station - deferred as pt is going to MRI.    ASSESSMENT/PLAN Ms. Theresa Kramer is a 75 y.o. female with history of a recent stroke, noncompliance with medications, hypertension, hyperlipidemia, diabetes mellitus, bronchitis, and depression presenting with confusion and left-sided weakness. She did not receive IV t-PA due to late presentation and recent stroke.  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 However, cardioembolic can not be ruled out at this time.  Resultant  Left mild hemiparesis  MRI - right ACA infarct  MRA - 07/30/2016 - occlusion RIGHT anterior cerebral artery at A2 segment. 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  CT head -  New evolving acute infarct at the medial distribution of the right ACA along the right frontal lobe.  Carotid Doppler - 07/30/2016 - unremarkable.  2D Echo - EF 60-65%. No cardiac source of emboli  identified.  LDL - 122  HgbA1c - 8.3  VTE prophylaxis - Lovenox Diet heart healthy/carb modified Room service appropriate? Yes; Fluid consistency: Thin  clopidogrel 75 mg daily prior to admission, now on clopidogrel 75 mg daily. Continue plavix on discharge.  Patient counseled to be compliant with her antithrombotic medications  Ongoing aggressive stroke risk factor management  Therapy recommendations: Pending  Disposition: Pending  Leukocytosis with atypical lymphocytes  WBC 6.7 in 2013  Now between 25-30  WBC morphology - atypical lymphocytes  Pt had recent steroids use  May consider peripheral smear and oncology consult  Hypertension  Stable  Permissive hypertension (OK if < 220/120) but gradually normalize in 5-7 days  Long-term BP goal normotensive  Concern if pt has any hypotensive episode at home to cause the infarct extension  Check orthostatic vitals  Daughter is advices  to check BP at home closely.  Hyperlipidemia  Home meds:  Pravachol 80 mg daily resumed in hospital  LDL 122, goal < 70  Continue statin at discharge  Diabetes  HgbA1c 8.3, goal < 7.0  Uncontrolled  On lantus  Needs better control as outpt  Other Stroke Risk Factors  Advanced age  Former cigarette smoker - remote.  Obesity, Body mass index is 37.49 kg/m., recommend weight loss, diet and exercise as appropriate   Hx stroke/TIA  History of noncompliance with medications.  Other Active Problems  Anemia  Hyponatremia - 131  UTI last admission - on cipro - UA repeat pending  Hospital day # 1  Rosalin Hawking, MD PhD Stroke Neurology 08/02/2016 1:14 PM      To contact Stroke Continuity provider, please refer to [email or web address] After hours, contact General Neurology

## 2016-08-03 DIAGNOSIS — I6350 Cerebral infarction due to unspecified occlusion or stenosis of unspecified cerebral artery: Secondary | ICD-10-CM

## 2016-08-03 DIAGNOSIS — M5481 Occipital neuralgia: Secondary | ICD-10-CM

## 2016-08-03 DIAGNOSIS — R319 Hematuria, unspecified: Secondary | ICD-10-CM

## 2016-08-03 LAB — URINALYSIS W MICROSCOPIC (NOT AT ARMC)
BILIRUBIN URINE: NEGATIVE
Glucose, UA: NEGATIVE mg/dL
Ketones, ur: NEGATIVE mg/dL
NITRITE: NEGATIVE
PH: 5.5 (ref 5.0–8.0)
Protein, ur: NEGATIVE mg/dL
SPECIFIC GRAVITY, URINE: 1.021 (ref 1.005–1.030)

## 2016-08-03 LAB — COMPREHENSIVE METABOLIC PANEL
ALBUMIN: 3.4 g/dL — AB (ref 3.5–5.0)
ALT: 38 U/L (ref 14–54)
ANION GAP: 8 (ref 5–15)
AST: 33 U/L (ref 15–41)
Alkaline Phosphatase: 66 U/L (ref 38–126)
BILIRUBIN TOTAL: 0.5 mg/dL (ref 0.3–1.2)
BUN: 21 mg/dL — ABNORMAL HIGH (ref 6–20)
CO2: 25 mmol/L (ref 22–32)
Calcium: 9.1 mg/dL (ref 8.9–10.3)
Chloride: 104 mmol/L (ref 101–111)
Creatinine, Ser: 0.73 mg/dL (ref 0.44–1.00)
GFR calc non Af Amer: >60 mL/min (ref >60)
GLUCOSE: 196 mg/dL — AB (ref 65–99)
POTASSIUM: 4 mmol/L (ref 3.5–5.1)
SODIUM: 137 mmol/L (ref 135–145)
TOTAL PROTEIN: 7 g/dL (ref 6.5–8.1)

## 2016-08-03 LAB — CBC WITH DIFFERENTIAL/PLATELET
BASOS PCT: 0 %
Band Neutrophils: 0 %
Basophils Absolute: 0 10*3/uL (ref 0.0–0.1)
Blasts: 0 %
EOS ABS: 0.7 10*3/uL (ref 0.0–0.7)
EOS PCT: 3 %
HCT: 33.7 % — ABNORMAL LOW (ref 36.0–46.0)
Hemoglobin: 10.1 g/dL — ABNORMAL LOW (ref 12.0–15.0)
LYMPHS PCT: 66 %
Lymphs Abs: 15.5 10*3/uL — ABNORMAL HIGH (ref 0.7–4.0)
MCH: 25.6 pg — ABNORMAL LOW (ref 26.0–34.0)
MCHC: 30 g/dL (ref 30.0–36.0)
MCV: 85.3 fL (ref 78.0–100.0)
MONO ABS: 4 10*3/uL — AB (ref 0.1–1.0)
MONOS PCT: 17 %
Metamyelocytes Relative: 0 %
Myelocytes: 0 %
NEUTROS ABS: 3.3 10*3/uL (ref 1.7–7.7)
NEUTROS PCT: 14 %
NRBC: 0 /100{WBCs}
Platelets: 313 10*3/uL (ref 150–400)
Promyelocytes Absolute: 0 %
RBC: 3.95 MIL/uL (ref 3.87–5.11)
RDW: 15.1 % (ref 11.5–15.5)
WBC: 23.5 10*3/uL — ABNORMAL HIGH (ref 4.0–10.5)

## 2016-08-03 LAB — GLUCOSE, CAPILLARY
GLUCOSE-CAPILLARY: 241 mg/dL — AB (ref 65–99)
GLUCOSE-CAPILLARY: 293 mg/dL — AB (ref 65–99)
GLUCOSE-CAPILLARY: 251 mg/dL — AB (ref 65–99)
Glucose-Capillary: 188 mg/dL — ABNORMAL HIGH (ref 65–99)

## 2016-08-03 LAB — MAGNESIUM: MAGNESIUM: 2.2 mg/dL (ref 1.7–2.4)

## 2016-08-03 LAB — SAVE SMEAR

## 2016-08-03 LAB — PHOSPHORUS: PHOSPHORUS: 3.4 mg/dL (ref 2.5–4.6)

## 2016-08-03 MED ORDER — DEXTROSE 5 % IV SOLN
1.0000 g | INTRAVENOUS | Status: DC
  Administered 2016-08-03 – 2016-08-04 (×2): 1 g via INTRAVENOUS
  Filled 2016-08-03 (×2): qty 10

## 2016-08-03 MED ORDER — LIDOCAINE HCL (PF) 2 % IJ SOLN
10.0000 mL | Freq: Once | INTRAMUSCULAR | Status: DC
  Filled 2016-08-03 (×2): qty 10

## 2016-08-03 MED ORDER — FLUCONAZOLE 100 MG PO TABS
100.0000 mg | ORAL_TABLET | Freq: Every day | ORAL | Status: DC
  Administered 2016-08-03: 100 mg via ORAL
  Filled 2016-08-03: qty 1

## 2016-08-03 MED ORDER — FLUCONAZOLE 100 MG PO TABS: 200.0000 mg | ORAL_TABLET | Freq: Every day | ORAL | Status: DC

## 2016-08-03 MED ORDER — TRAMADOL HCL 50 MG PO TABS
50.0000 mg | ORAL_TABLET | Freq: Four times a day (QID) | ORAL | Status: DC | PRN
Start: 2016-08-03 — End: 2016-08-04
  Administered 2016-08-03 – 2016-08-04 (×3): 50 mg via ORAL
  Filled 2016-08-03 (×3): qty 1

## 2016-08-03 MED ORDER — FLUCONAZOLE 100 MG PO TABS
200.0000 mg | ORAL_TABLET | Freq: Every day | ORAL | Status: DC
  Administered 2016-08-04: 200 mg via ORAL
  Filled 2016-08-03: qty 2

## 2016-08-03 NOTE — Evaluation (Signed)
Occupational Therapy Evaluation Patient Details Name: Theresa Kramer MRN: [id number] DOB: 1941-02-08 Today's Date: 08/03/2016    History of Present Illness pt is a 75 y/o female with ;mh significant for HTN, IDDM who presents with left sided weakness.  MRI shows large R ACA territory infarct.   Clinical Impression   Pt was independent in self care, housekeeping and meal prep. She has assist to manage meds and pay bills. She drives short distances. Pt has baseline impairment of cognition and per daughter is at her baseline. Pt presents with mild L side weakness and incoordination and decreased balance interfering with ability to perform ADL and mobility at her baseline. Pt is eager to go home. Recommending HHOT upon d/c with pt and daughter in agreement. Will follow acutely.   Follow Up Recommendations  Home health OT    Equipment Recommendations  None recommended by OT    Recommendations for Other Services       Precautions / Restrictions Precautions Precautions: Fall Restrictions Weight Bearing Restrictions: No      Mobility Bed Mobility               General bed mobility comments: pt seated at EOB   Transfers Overall transfer level: Needs assistance   Transfers: Sit to/from Stand Sit to Stand: Min guard         General transfer comment: did not use device to ambulate to/from bathroom, guarded for safety    Balance     Sitting balance-Leahy Scale: Fair Sitting balance - Comments: min guard as she attempted to manage socks, but no LOB     Standing balance-Leahy Scale: Fair                              ADL Overall ADL's : Needs assistance/impaired Eating/Feeding: Independent;Sitting   Grooming: Wash/dry hands;Standing;Supervision/safety   Upper Body Bathing: Set up;Sitting   Lower Body Bathing: Minimal assistance;Sit to/from stand Lower Body Bathing Details (indicate cue type and reason): for feet, recommended long handled bath  sponge Upper Body Dressing : Set up;Sitting   Lower Body Dressing: Minimal assistance;Sit to/from stand Lower Body Dressing Details (indicate cue type and reason): pt does not wear socks at baseline, min assist to don, wears slip on shoes, min assist to start pants over feet Toilet Transfer: Min guard;Ambulation;Comfort height toilet;Grab bars   Toileting- Clothing Manipulation and Hygiene: Min guard;Sit to/from stand       Functional mobility during ADLs: Min guard       Vision     Perception     Praxis      Pertinent Vitals/Pain Pain Assessment: No/denies pain     Hand Dominance Right   Extremity/Trunk Assessment Upper Extremity Assessment Upper Extremity Assessment: LUE deficits/detail LUE Deficits / Details: shoulder 4-/5, elbow 4+/5, gross grasp 4/5, hx of reverse TSA--longstanding limitations LUE Sensation:  (no drift, intact temp) LUE Coordination: decreased gross motor   Lower Extremity Assessment Lower Extremity Assessment: Defer to PT evaluation   Cervical / Trunk Assessment Cervical / Trunk Assessment: Normal   Communication Communication Communication: Expressive difficulties (mild slurring)   Cognition Arousal/Alertness: Awake/alert Behavior During Therapy: Flat affect Overall Cognitive Status: History of cognitive impairments - at baseline (per daughter)       Memory: Decreased short-term memory             General Comments       Exercises  Shoulder Instructions      Home Living Family/patient expects to be discharged to:: Private residence Living Arrangements: Children Available Help at Discharge: Family Type of Home: House (townhome) Home Access: Stairs to enter Technical brewer of Steps: 3 Entrance Stairs-Rails: Right;Left Home Layout: One level     Bathroom Shower/Tub: Occupational psychologist: Handicapped height     [place]: [place] in          Prior Functioning/Environment  Level of Independence: Independent        Comments: sometimes sits to shower if she gets tired, prepares simple meals, drives short distances        OT Problem List: Decreased strength;Decreased activity tolerance;Impaired balance (sitting and/or standing);Decreased coordination;Decreased cognition;Decreased safety awareness;Decreased knowledge of use of DME or AE   OT Treatment/Interventions: Self-care/ADL training;Neuromuscular education;DME and/or AE instruction;Therapeutic activities;Cognitive remediation/compensation;Patient/family education;Balance training    OT Goals(Current goals can be found in the care plan section) Acute Rehab OT Goals Patient Stated Goal: go home OT Goal Formulation: With patient Time For Goal Achievement: 08/10/16 Potential to Achieve Goals: Good ADL Goals Pt Will Perform Grooming: with modified independence;standing Pt Will Perform Lower Body Bathing: with modified independence;sit to/from stand Pt Will Perform Lower Body Dressing: with modified independence;sit to/from stand (pants, slip on shoes only) Pt Will Transfer to Toilet: with modified independence;ambulating Pt Will Perform Toileting - Clothing Manipulation and hygiene: with modified independence;sit to/from stand Pt Will Perform Tub/Shower Transfer: Shower transfer;ambulating;rolling walker;with modified independence  OT Frequency: Min 2X/week   Barriers to D/C:            Co-evaluation              End of Session Equipment Utilized During Treatment: Gait belt  Activity Tolerance: Patient tolerated treatment well Patient left: in bed;with call bell/phone within reach;with family/visitor present   Time: [id number] OT Time Calculation (min): 27 min Charges:  OT General Charges $OT Visit: 1 Procedure OT Evaluation $OT Eval Moderate Complexity: 1 Procedure OT Treatments $Self Care/Home Management : 8-22 mins G-Codes:    Malka So 08/03/2016, 9:23 AM   [phone]

## 2016-08-03 NOTE — Progress Notes (Signed)
STROKE TEAM PROGRESS NOTE   SUBJECTIVE (INTERVAL HISTORY) Her daughter is at the bedside. Pt and daughter stated that she has some chronic left arm and hand weakness since the left shoulder surgery. Pt now felt that her left arm hand weakness is at the baseline. However, pt complains of neck pain, back of head hurts and radiating to b/l frontal area. She had this a couple of weeks ago and followed with orthopedics and given steroids dose pack but it comes back now. Exam does show occipital neuralgia, will do occipital nerve block.    OBJECTIVE Temp:  [97.9 F (36.6 C)-98.8 F (37.1 C)] 98.8 F (37.1 C) (10/01 0948) Pulse Rate:  [69-91] 87 (10/01 0948) Cardiac Rhythm: Normal sinus rhythm (09/30 1900) Resp:  [16-20] 20 (10/01 0948) BP: (112-150)/(34-109) 150/69 (10/01 0948) SpO2:  [94 %-97 %] 97 % (10/01 0948)  CBC:   Recent Labs Lab 08/02/16 0823 08/03/16 0250  WBC 25.4* 23.5*  NEUTROABS 4.3 3.3  HGB 10.4* 10.1*  HCT 33.7* 33.7*  MCV 84.0 85.3  PLT 354 [id number]    Basic Metabolic Panel:   Recent Labs Lab 08/02/16 0823 08/03/16 0250  NA 131* 137  K 4.2 4.0  CL 97* 104  CO2 22 25  GLUCOSE 307* 196*  BUN 23* 21*  CREATININE 0.85 0.73  CALCIUM 8.7* 9.1  MG 2.0 2.2  PHOS 2.6 3.4    Lipid Panel:     Component Value Date/Time   CHOL 202 (H) 07/30/2016 0637   TRIG 259 (H) 07/30/2016 0637   HDL 28 (L) 07/30/2016 0637   CHOLHDL 7.2 07/30/2016 0637   VLDL 52 (H) 07/30/2016 0637   LDLCALC 122 (H) 07/30/2016 0637   HgbA1c:  Lab Results  Component Value Date   HGBA1C 8.3 (H) 07/30/2016   Urine Drug Screen:     Component Value Date/Time   LABOPIA NONE DETECTED 07/29/2016 1930   COCAINSCRNUR NONE DETECTED 07/29/2016 1930   LABBENZ NONE DETECTED 07/29/2016 1930   AMPHETMU NONE DETECTED 07/29/2016 1930   THCU NONE DETECTED 07/29/2016 1930   LABBARB NONE DETECTED 07/29/2016 1930      IMAGING I have personally reviewed the radiological images below and agree with  the radiology interpretations.  Ct Head Wo Contrast 08/01/2016 1. 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 This is much larger than the previously noted small punctate infarcts in this region. Mild associated mass effect, without significant midline shift.  2. Mild cortical volume loss noted.   MRI Brain W/O Cm  1. Large right ACA territory infarct has developed since 07/30/2016. No evidence of hemorrhagic transformation. No significant intracranial mass effect. 2. No other vascular territory ischemia identified.   PHYSICAL EXAM  Temp:  [97.9 F (36.6 C)-98.8 F (37.1 C)] 98.8 F (37.1 C) (10/01 0948) Pulse Rate:  [69-91] 87 (10/01 0948) Resp:  [16-20] 20 (10/01 0948) BP: (112-150)/(34-109) 150/69 (10/01 0948) SpO2:  [94 %-97 %] 97 % (10/01 0948)  General - Well nourished, well developed, in no apparent distress, mildly restless.  Ophthalmologic - Fundi not visualized due to noncooperation.  Cardiovascular - Regular rate and rhythm.  Mental Status -  Level of arousal and orientation to time, place, and person were intact. Language including expression, naming, repetition, comprehension was assessed and found intact. Attention span and concentration were impaired, able to calculate but wrong on backward spelling. Recent and remote memory were 3/3 registration and 2/3 delayed recall.  Fund of Knowledge was assessed and was intact.  Cranial Nerves II - XII - II - Visual field intact OU. III, IV, VI - Extraocular movements intact. V - Facial sensation intact bilaterally. VII - Facial movement intact bilaterally. VIII - Hearing & vestibular intact bilaterally. X - Palate elevates symmetrically. XI - Chin turning & shoulder shrug intact bilaterally. XII - Tongue protrusion intact.  Motor Strength - The patient's strength was normal in all extremities except left arm mild pronator drift and left  hand dexterity difficulty (likely chronic as per pt) with left foot DF/PF 4+/5.  Bulk was normal and fasciculations were absent.   Motor Tone - Muscle tone was assessed at the neck and appendages and was normal.  Reflexes - The patient's reflexes were 1+ in all extremities and she had no pathological reflexes.  Sensory - Light touch, temperature/pinprick were assessed and were symmetrical.    Coordination - The patient had normal movements in the hands with no ataxia or dysmetria.  Tremor was absent.  Gait and Station - deferred due to safety concerns.    ASSESSMENT/PLAN Ms. JACLIN DENENBERG is a 75 y.o. female with history of a recent stroke, noncompliance with medications, hypertension, hyperlipidemia, diabetes mellitus, bronchitis, and depression presenting with confusion and left-sided weakness. She did not receive IV t-PA due to late presentation and recent stroke.  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 However, cardioembolic can not be ruled out at this time.  Resultant  Left mild hemiparesis  MRI - right ACA infarct  MRA - 07/30/2016 - occlusion RIGHT anterior cerebral artery at A2 segment. 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  CT head -  New evolving acute infarct at the medial distribution of the right ACA along the right frontal lobe.  Carotid Doppler - 07/30/2016 - unremarkable.  2D Echo - EF 60-65%. No cardiac source of emboli identified.  Recommend 30 day cardiac event monitoring as outpt to rule out afib.  LDL - 122  HgbA1c - 8.3  VTE prophylaxis - Lovenox Diet heart healthy/carb modified Room service appropriate? Yes; Fluid consistency: Thin  clopidogrel 75 mg daily prior to admission, now on clopidogrel 75 mg daily. Continue plavix on discharge.  Patient counseled to be compliant with her antithrombotic medications  Ongoing aggressive stroke risk factor management  Therapy recommendations: Home  Health PT and OT recommended.  Disposition: Pending  Leukocytosis with atypical lymphocytes  WBC 6.7 in 2013  Now 25.4 -> 23.5  WBC morphology - atypical lymphocytes  May be related to recent steroids use  Continue monitoring  Occipital neuralgia  Neck pain, back of head pain, tenderness on bilateral occipital nerve palpitation  Had sterids dose pack 1-2 weeks ago but not comes back again  Will do occipital nerve block  UTI  Repeat UA WBC TMTC  U Cx showed positive klebsiellar P.   On cipro which was sensitive per last urine culture  May consider antibiotics change.  Hypertension  Stable  Permissive hypertension (OK if < 220/120) but gradually normalize in 5-7 days  Long-term BP goal normotensive  Check orthostatic vitals - no orthostatic hypotension  Daughter is advised to check BP at home closely.  Hyperlipidemia  Home meds:  Pravachol 80 mg daily resumed in hospital  LDL 122, goal < 70  Continue statin at discharge  Diabetes  HgbA1c 8.3, goal < 7.0  Uncontrolled  On lantus  Needs better control as outpt  Other Stroke  Risk Factors  Advanced age  Former cigarette smoker - remote.  Obesity, Body mass index is 37.49 kg/m., recommend weight loss, diet and exercise as appropriate   Hx stroke/TIA  History of noncompliance with medications.  Other Active Problems  Anemia  Hyponatremia - 131 -> 137    Hospital day # 2  Rosalin Hawking, MD PhD Stroke Neurology 08/03/2016 11:27 AM    To contact Stroke Continuity provider, please refer to [email or web address] After hours, contact General Neurology

## 2016-08-03 NOTE — Progress Notes (Signed)
PROGRESS NOTE    Theresa Kramer  [id number] DOB: 1941-02-10 DOA: 08/01/2016 PCP: Asencion Noble, MD  Brief Narrative:  Theresa Kramer is a 75 y.o. female with a past medical history significant for HTN, IDDM who presents with left sided weakness.She was also admitted 2 days ago with nonspecific abnormal behaviors thought initially to be related to TIA versus delirium from some underlying neurologic stressor. MRI/MRA were obtained which showed some punctate findings in the right ACA distribution, ultimately thought by stroke team not to be infarction, and the patient was treated for a UTI, and hydrated.  Echocardiogram was obtained that was normal.  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 Today's MRI showed right ACA territory infarct has developed since 07/30/2016 and Stroke Team is following.   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 Evolving Large Acute CVA at the Medial Distribution of the R Anterior Cerebral Artery along the Frontal Lobes likely 2/2 to Failure of Collaterals with subsequent Ischemia and Right A2 Occulsion.  2. Hyperglycemia in the setting of Insulin Dependent Diabetes Mellitus Type 2 3. Leukocytosis in the Setting of Prednisone use 4. UTI, poA from Last Admission (incomplete Tx) 5. Microscopic Hematuria 6. Occipital Neuralgia 7. Mild Hyponatremia 8 Hypertension 9. Hyperlipdemia 10. GERD 11. OAB 12. Restless Leg Syndrome 13. Depression  PLAN  1. Evolving Large Acute CVA at the Medial Distribution of the R Anterior Cerebral Artery  along the Frontal Lobes likely 2/2 to Failure of Collaterals with subsequent Ischemia and Right A2 Occulsion.  -Neurology Stroke Team Consulted and Appreciate Recommendations. Discussed Case with Dr. Erlinda Hong -CT Head showed New evolving acute infarct at the medial distribution of the right anterior cerebral artery along the right frontal lobe. This is much larger than the previously noted small punctate infarcts in this region. Mild associated mass effect, without significant midline shift.  Mild cortical volume loss noted.  -MRI of Brain showed Large right ACA territory infarct has developed since 07/30/2016. No evidence of hemorrhagic transformation. No significant intracranial mass effect. No other vascular territory ischemia identified. -Carotid Dopplers Done 07/30/16 -Transthoracic Echocardiogram done yesterday showed Left ventricle: The cavity size was normal. Wall thickness was increased in a pattern of moderate LVH. Systolic function was   normal. The estimated ejection fraction was in the range of 60% to 65%. Wall motion was normal; there were no regional wall motion abnormalities. Doppler parameters are consistent with abnormal left ventricular relaxation (grade 1 diastolic dysfunction). -Lipid Panel showed Cholesterol of 202, HDL of 28, LDL of 122, TG of 259 -HbA1c 8.3% -Plavix 75 mg po Daily and Pravastatin 80 mg po qHS -PT/OT/SLP Evaluate and treat - PT recommends Home PT and OT reccommends Home OT with no Equipment Recommendations -Neurochecks per protocol -Continue with Telemetry -Allow for Permissive HTN -Will Likely D/C tomorrow  2. Hyperglycemia in the setting of Insulin Dependent Diabetes Mellitus Type 2 -BS on BMP was 196 -Heart Healthy/Carb Modified Diet -C/w Novolog Moderate SSI AC and HS correction; May need to increase to Resistant Scale -C/w Lantus 70 units sq at bedtime -  HbA1c 8.3 -Hypoglycemia Protocol in Place  3. Leukocytosis  -Patients WBC went from 24.9 -> 30.1 ->  25.4 -> 23.5 -Possibly from Prednisone however patient had Atypical Lymphocytes and Mononuclear Cells -Peripheral Smear obtainedand Repeat CBC -Patient had outpatient Appointment with Heme/Onc -Will consider Inpatient Heme/Onc Curbside vs. Actual Consult for Recommendations   4. UTI, poA from previous Admission (incomplete Tx) -Patient Immunosuppressed because of Diabetes -UTI on previous Admission/Discharge was treated with Ciprofloxacin 500 mg po BID -Patient states she has symptoms of burning still -UA was Cloudy with Many UA Bacteria, Moderate Leukocytes, TNTC WBC as well as Yeast Present -Urine C/x showed Klebsiella Pneumoniae on 07/29/16 -D/C'd Ciprofloxacin 500 mg BID and Started Ceftriaxone 1 gram q24h -Started Fluconazole 200 mg po x 5 days   5. Microscopic Hematuria  -Large Blood found in Ua; No Gross Hematuria -Likely 2/2 to UTI -Continue to Monitor; If still Present will need Urologic Eval  6. Occipital Neuralgia -Restarted Ultram today -Neurology to Do Occipital Block   7. Mild Hyponatremia, resolved -Patients' Na+ was 131 -> 137 -Repeat CMP in AM  8. Hypertension -Allow for Permissive HTN. BP has been ranging from 121-150 SBP and 47-109 -Hold Home Antihypertensive Medications.  -Add back Slowly  9. Hyperlipidemia -Lipid Panel showed Cholesterol of 202, HDL of 28, LDL of 122, TG of 259 -Patient was started on Pravastatin 80 mg po qHS and Gemfibrozil 600 mg po BID  10. GERD -Continue with Famotidine 20 mg po qHS and with Pantoprazole 40 mg po ACB  11. OAB -Continue with Oxybutynin 10 mg po ACB  12. Restless Leg Syndrome -Continue with Pramipexole 0.25  13. Depression -C/w Citalopram 20 mg po Daily  DVT prophylaxis: Lovenox Code Status: Full Family Communication: Discussed Case with patient. Patient's Daughter was in there room but left when plan of care was discussed. Disposition Plan: Home with Home Health PT  Consultants:   Neurology Stroke  Team  Procedures:   Antimicrobials: Ceftriaxone 1 g q24h   Subjective: Patient was seen and examined at bedside sitting doing a Crossward Puzzle. Had no active complaints and states she was doing well with physical therapy. Admitted to a little burning and discomfort still in her urine today. Also complained of a headache towards the back of her neck. Denied dizziness, Cp, SOB, N/V, Lightheadedness. No other complaints or concerns.   Objective: Vitals:   08/03/16 0058 08/03/16 0602 08/03/16 0948 08/03/16 1313  BP: 131/86 (!) 143/109 (!) 150/69 (!) 131/47  Pulse: 77 91 87 76  Resp: 18 20 20 20   Temp: 98.3 F (36.8 C) 97.9 F (36.6 C) 98.8 F (37.1 C) 98.7 F (37.1 C)  TempSrc: Oral Oral Oral Oral  SpO2: 96% 97% 97% 93%  Weight:      Height:       No intake or output data in the 24 hours ending 08/03/16 1651 Filed Weights   08/02/16 0000  Weight: 99.1 kg (218 lb 6.4 oz)   Examination: Physical Exam:  Constitutional: WN/WD obese female, NAD and appears calm and comfortable sitting at edge of bed  Eyes:  Lids and conjunctivae normal, sclerae anicteric  ENMT: External Ears, Nose appear normal. Grossly normal hearing.  Neck: Appears normal, supple, no cervical masses, normal ROM, no appreciable thyromegaly, no JVD Respiratory: Clear to auscultation bilaterally, no wheezing, rales, rhonchi or crackles. Normal respiratory effort and patient is not tachypenic. No accessory muscle use.  Cardiovascular: RRR, no murmurs / rubs / gallops. S1 and S2 auscultated. No extremity  edema. 2+ pedal pulses.  Abdomen: Soft, non-tender, non-distended. No masses palpated. No appreciable hepatosplenomegaly. Bowel sounds positive x4.  GU: Deferred. Musculoskeletal: No clubbing / cyanosis of digits/nails. No joint deformity upper and lower extremities. No contractures. Normal muscle tone.  Skin: No rashes, lesions, ulcers. No induration; Warm and dry.  Neurologic: CN 2-12 grossly intact with no focal  deficits. Sensation intact in all 4 Extremities, Strength improving n LUE and LLE. Romberg sign cerebellar reflexes not assessed.  Psychiatric: Normal judgment and insight. Alert and oriented x 3. Normal mood and flat affect.   Data Reviewed: I have reviewed the following.   CBC:  Recent Labs Lab 07/29/16 1800 07/30/16 0637 07/31/16 0510 08/01/16 1734 08/02/16 0823 08/03/16 0250  WBC 27.1* 27.3* 24.9* 30.1* 25.4* 23.5*  NEUTROABS 6.8  --   --   --  4.3 3.3  HGB 10.1* 10.9* 10.4* 11.1* 10.4* 10.1*  HCT 32.9* 35.6* 34.9* 37.0 33.7* 33.7*  MCV 83.7 84.6 83.9 85.3 84.0 85.3  PLT 392 407* 348 422* 354 [id number]   Basic Metabolic Panel:  Recent Labs Lab 07/29/16 1800 07/31/16 0510 08/01/16 1734 08/02/16 0823 08/03/16 0250  NA 139 136 131* 131* 137  K 3.6 4.2 4.5 4.2 4.0  CL 105 102 97* 97* 104  CO2 21* 26 22 22 25   GLUCOSE 86 235* 310* 307* 196*  BUN 36* 23* 28* 23* 21*  CREATININE 0.77 0.81 1.03* 0.85 0.73  CALCIUM 9.6 9.2 9.1 8.7* 9.1  MG  --   --   --  2.0 2.2  PHOS  --   --   --  2.6 3.4   GFR: Estimated Creatinine Clearance: 69.5 mL/min (by C-G formula based on SCr of 0.73 mg/dL). Liver Function Tests:  Recent Labs Lab 07/29/16 1800 08/02/16 0823 08/03/16 0250  AST 28 32 33  ALT 21 33 38  ALKPHOS 54 69 66  BILITOT 0.4 0.5 0.5  PROT 7.3 6.9 7.0  ALBUMIN 3.7 3.5 3.4*   No results for input(s): LIPASE, AMYLASE in the last 168 hours. No results for input(s): AMMONIA in the last 168 hours. Coagulation Profile:  Recent Labs Lab 07/29/16 1800  INR 0.98   Cardiac Enzymes:  Recent Labs Lab 07/29/16 1800  TROPONINI <0.03   BNP (last 3 results) No results for input(s): PROBNP in the last 8760 hours. HbA1C: No results for input(s): HGBA1C in the last 72 hours. CBG:  Recent Labs Lab 08/02/16 1628 08/02/16 2139 08/03/16 0642 08/03/16 1127 08/03/16 1621  GLUCAP 263* 308* 241* 293* 188*   Lipid Profile: No results for input(s): CHOL, HDL, LDLCALC,  TRIG, CHOLHDL, LDLDIRECT in the last 72 hours. Thyroid Function Tests: No results for input(s): TSH, T4TOTAL, FREET4, T3FREE, THYROIDAB in the last 72 hours. Anemia Panel: No results for input(s): VITAMINB12, FOLATE, FERRITIN, TIBC, IRON, RETICCTPCT in the last 72 hours. Sepsis Labs: No results for input(s): PROCALCITON, LATICACIDVEN in the last 168 hours.  Recent Results (from the past 240 hour(s))  Urine culture     Status: Abnormal   Collection Time: 07/29/16  7:30 PM  Result Value Ref Range Status   Specimen Description   Final    URINE, RANDOM Performed at [facility] Requests NONE  Final   Culture >=100,000 COLONIES/mL KLEBSIELLA PNEUMONIAE (A)  Final   Report Status 08/01/2016 FINAL  Final   Organism ID, Bacteria KLEBSIELLA PNEUMONIAE (A)  Final      Susceptibility   Klebsiella pneumoniae - MIC*  AMPICILLIN >=32 RESISTANT Resistant     CEFAZOLIN <=4 SENSITIVE Sensitive     CEFTRIAXONE <=1 SENSITIVE Sensitive     CIPROFLOXACIN <=0.25 SENSITIVE Sensitive     GENTAMICIN <=1 SENSITIVE Sensitive     IMIPENEM <=0.25 SENSITIVE Sensitive     NITROFURANTOIN 64 INTERMEDIATE Intermediate     TRIMETH/SULFA <=20 SENSITIVE Sensitive     AMPICILLIN/SULBACTAM 4 SENSITIVE Sensitive     PIP/TAZO <=4 SENSITIVE Sensitive     Extended ESBL NEGATIVE Sensitive     * >=100,000 COLONIES/mL KLEBSIELLA PNEUMONIAE    Radiology Studies: Dg Chest 2 View  Result Date: 08/02/2016 CLINICAL DATA:  76 year old female with history of diabetes and hypertension. EXAM: CHEST  2 VIEW COMPARISON:  Chest x-ray 11/10/2015. FINDINGS: Lung volumes are normal. No consolidative airspace disease. No pleural effusions. No pneumothorax. No pulmonary nodule or mass noted. Pulmonary vasculature and the cardiomediastinal silhouette are within normal limits. Orthopedic fixation hardware in the lower cervical spine. Postoperative changes of left shoulder arthroplasty incompletely visualized. IMPRESSION:  1. No radiographic evidence of acute cardiopulmonary disease. Electronically Signed   By: Vinnie Langton M.D.   On: 08/02/2016 12:00   Ct Head Wo Contrast  Result Date: 08/01/2016 CLINICAL DATA:  Subacute onset of left-sided weakness. Initial encounter. EXAM: CT HEAD WITHOUT CONTRAST TECHNIQUE: Contiguous axial images were obtained from the base of the skull through the vertex without intravenous contrast. COMPARISON:  MRI of the brain performed 07/30/2016, and CT of the head performed 07/29/2016 FINDINGS: Brain: There is a new evolving acute infarct at the medial distribution of the right anterior cerebral artery, along the right frontal lobe. This is much larger than the previously noted small punctate infarcts in this region. Mild associated mass effect is noted, without evidence of significant midline shift. Prominence of the ventricles and sulci reflects mild cortical volume loss. There is no evidence of hemorrhagic transformation, hydrocephalus or mass lesion. The brainstem and fourth ventricle are within normal limits. The basal ganglia are unremarkable in appearance. Vascular: No hyperdense vessel or unexpected calcification. Skull: There is no evidence of fracture; visualized osseous structures are unremarkable in appearance. Sinuses/Orbits: The orbits are within normal limits. The paranasal sinuses and mastoid air cells are well-aerated. Other: No significant soft tissue abnormalities are seen. IMPRESSION: 1. 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 This is much larger than the previously noted small punctate infarcts in this region. Mild associated mass effect, without significant midline shift. 2. Mild cortical volume loss noted. These results were called by telephone at the time of interpretation on 08/01/2016 at 9:32 pm to [facility] PA, who verbally acknowledged these results. Electronically Signed   By: Garald Balding M.D.   On: 08/01/2016 21:33   Mr Brain Ltd W/o Cm  Result Date: 08/02/2016 CLINICAL DATA:  75 year old female with suspected punctate lacunar infarcts in the right hemisphere on recent MRI done for altered mental status. Neurology request for repeat diffusion-weighted imaging only. Recent hyperglycemia. EXAM: MRI HEAD WITHOUT CONTRAST LIMITED TECHNIQUE: Multiplanar, multiecho pulse sequences of the brain and surrounding structures were obtained without intravenous contrast. COMPARISON:  Brain MRI and intracranial MRA 07/30/2016. FINDINGS: Axial and coronal diffusion weighted imaging was performed. Axial susceptibility weighted imaging was also performed. There is now all confluent restricted diffusion throughout the right ACA territory extending over an area of 6-7 cm of the right corpus callosum, cingulate gyrus, and medial aspect of the right superior frontal gyrus.  No restricted diffusion elsewhere. No hemorrhagic transformation on susceptibility weighted imaging. No significant intracranial mass effect. IMPRESSION: 1. Large right ACA territory infarct has developed since 07/30/2016. No evidence of hemorrhagic transformation. No significant intracranial mass effect. 2. No other vascular territory ischemia identified. Electronically Signed   By: Genevie Ann M.D.   On: 08/02/2016 11:59   ECHOCARDIOGRAM 08/01/16: -Left ventricle: The cavity size was normal. Wall thickness was   increased in a pattern of moderate LVH. Systolic function was   normal. The estimated ejection fraction was in the range of 60%   to 65%. Wall motion was normal; there were no regional wall   motion abnormalities. Doppler parameters are consistent with   abnormal left ventricular relaxation (grade 1 diastolic   dysfunction). - Mitral valve: Calcified annulus. Moderately thickened leaflets . - Left atrium: The atrium was mildly dilated. - Atrial septum: No defect or patent foramen ovale was identified. - Pulmonary arteries:  PA peak pressure: 35 mm Hg (S).  Scheduled Meds: . acetaminophen  650 mg Oral TID AC & HS  . cefTRIAXone (ROCEPHIN)  IV  1 g Intravenous Q24H  . citalopram  20 mg Oral Daily  . clopidogrel  75 mg Oral Daily  . enoxaparin (LOVENOX) injection  40 mg Subcutaneous Daily  . famotidine  20 mg Oral QHS  . fluconazole  100 mg Oral Daily  . gemfibrozil  600 mg Oral BID AC  . insulin aspart  0-15 Units Subcutaneous TID WC  . insulin aspart  0-5 Units Subcutaneous QHS  . insulin glargine  70 Units Subcutaneous QHS  . lidocaine  10 mL Intradermal Once  . oxybutynin  10 mg Oral Daily  . pantoprazole  40 mg Oral Daily  . pramipexole  0.25 mg Oral Daily  . pravastatin  80 mg Oral Daily   Continuous Infusions:    LOS: 2 days   Kerney Elbe, DO [facility] Pager [phone]  If 7PM-7AM, please contact night-coverage [email or web address] Password [email or web address] 08/03/2016, 4:51 PM

## 2016-08-04 DIAGNOSIS — N3001 Acute cystitis with hematuria: Secondary | ICD-10-CM

## 2016-08-04 DIAGNOSIS — Z23 Encounter for immunization: Secondary | ICD-10-CM | POA: Diagnosis not present

## 2016-08-04 LAB — COMPREHENSIVE METABOLIC PANEL
ALT: 43 U/L (ref 14–54)
ANION GAP: 12 (ref 5–15)
AST: 38 U/L (ref 15–41)
Albumin: 3.6 g/dL (ref 3.5–5.0)
Alkaline Phosphatase: 84 U/L (ref 38–126)
BUN: 16 mg/dL (ref 6–20)
CHLORIDE: 98 mmol/L — AB (ref 101–111)
CO2: 24 mmol/L (ref 22–32)
Calcium: 9.5 mg/dL (ref 8.9–10.3)
Creatinine, Ser: 0.73 mg/dL (ref 0.44–1.00)
Glucose, Bld: 200 mg/dL — ABNORMAL HIGH (ref 65–99)
POTASSIUM: 4.4 mmol/L (ref 3.5–5.1)
SODIUM: 134 mmol/L — AB (ref 135–145)
Total Bilirubin: 0.4 mg/dL (ref 0.3–1.2)
Total Protein: 7.4 g/dL (ref 6.5–8.1)

## 2016-08-04 LAB — CBC WITH DIFFERENTIAL/PLATELET
BAND NEUTROPHILS: 0 %
BASOS PCT: 0 %
Basophils Absolute: 0 10*3/uL (ref 0.0–0.1)
Blasts: 0 %
EOS ABS: 0.7 10*3/uL (ref 0.0–0.7)
EOS PCT: 3 %
HEMATOCRIT: 34.8 % — AB (ref 36.0–46.0)
HEMOGLOBIN: 10.6 g/dL — AB (ref 12.0–15.0)
LYMPHS PCT: 78 %
Lymphs Abs: 19.1 10*3/uL — ABNORMAL HIGH (ref 0.7–4.0)
MCH: 25.5 pg — AB (ref 26.0–34.0)
MCHC: 30.5 g/dL (ref 30.0–36.0)
MCV: 83.7 fL (ref 78.0–100.0)
MONO ABS: 1.2 10*3/uL — AB (ref 0.1–1.0)
MONOS PCT: 5 %
Metamyelocytes Relative: 0 %
Myelocytes: 0 %
NEUTROS ABS: 3.4 10*3/uL (ref 1.7–7.7)
NEUTROS PCT: 14 %
NRBC: 0 /100{WBCs}
OTHER: 0 %
PLATELETS: 321 10*3/uL (ref 150–400)
PROMYELOCYTES ABS: 0 %
RBC: 4.16 MIL/uL (ref 3.87–5.11)
RDW: 15.2 % (ref 11.5–15.5)
WBC: 24.4 10*3/uL — ABNORMAL HIGH (ref 4.0–10.5)

## 2016-08-04 LAB — GLUCOSE, CAPILLARY
Glucose-Capillary: 174 mg/dL — ABNORMAL HIGH (ref 65–99)
Glucose-Capillary: 214 mg/dL — ABNORMAL HIGH (ref 65–99)
Glucose-Capillary: 123 mg/dL — ABNORMAL HIGH (ref 65–99)

## 2016-08-04 LAB — MAGNESIUM: MAGNESIUM: 2.1 mg/dL (ref 1.7–2.4)

## 2016-08-04 MED ORDER — FLUCONAZOLE 200 MG PO TABS: 200.0000 mg | ORAL_TABLET | Freq: Every day | ORAL | 0 refills | Status: AC

## 2016-08-04 MED ORDER — TRAMADOL HCL 50 MG PO TABS: 50.0000 mg | ORAL_TABLET | Freq: Four times a day (QID) | ORAL | 0 refills | Status: AC | PRN

## 2016-08-04 NOTE — Discharge Summary (Signed)
Physician Discharge Summary  Theresa Kramer [id number] DOB: [birth year] DOA: 08/01/2016  PCP: Asencion Noble, MD  Admit date: 08/01/2016 Discharge date: 08/04/2016  Admitted From: Home Disposition: Home with Home Health  Recommendations for Outpatient Follow-up:  1. Follow up with PCP Dr. Asencion Noble on August 07, 2016 at 11:45 am 2. Follow up with Dr. Nicholas Lose in Hematology/Oncology in 1 week after Flow Cytometry test is done for concern of CLL 3. Follow up with Neurology in 6 weeks at [place] 4. Please obtain BMP/CBC in one week 5. Please follow up on the following pending results: Flow Cytometry for [place]: Yes Equipment/Devices: None  Discharge Condition: Stable and Improved  CODE STATUS: FULL Diet recommendation: Heart Healthy Carb Modified Low Salt.   Brief/Interim Summary: Theresa Kramer a 75 y.o.femalewith a past medical history significant for HTN, IDDMwho presented with left sided weakness.She was also admitted last week with nonspecific abnormal behaviors thought initially to be related to TIA versus delirium from some underlying neurologic stressor. MRI/MRA were obtained which showed some punctate findings in the right ACA distribution, ultimately thought by stroke team not to be infarction, andthe patient was treated for a UTI, and hydrated. Echocardiogram was obtained that was normal. 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 Patient still complained of Urinary Symptoms so she was treated initially with po Ciprofloxacin but switched to IV Ceftriaxone to complete her Abx Course. Because of her immunocompromised state she was also treated for the yeast in her urine. Throughout the course of the hospitalization the patient progressed and PT/OT recommended Home Physical Therapy and Occupational Therapy. Patient was also  noticed to have an elevated Leukocyte Count with Atypical Mononuclear Cells and Lymphocytes and Heme/Onc was called and they recommended Flow Cytometry to evaluate for CLL and follow up as an outpatient. Patient at this time was deemed medically stable to be D/C'd Home and is to follow up with PCP, Hematology/Oncology, and Neurology as an outpatient.   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 Evolving Large Acute CVA at the Medial Distribution of the R Anterior Cerebral Artery along the Frontal Lobes likely 2/2 to Failure of Collaterals with subsequent Ischemia and Right A2 Occulsion.  2. Hyperglycemia in the setting of Insulin Dependent Diabetes Mellitus Type 2 3. Leukocytosis in the Setting of Prednisone use 4. UTI, poA from Last Admission (incomplete Tx) 5. Microscopic Hematuria 6. Occipital Neuralgia 7. Mild Hyponatremia 8 Hypertension 9. Hyperlipdemia 10. GERD 11. OAB 12. Restless Leg Syndrome 13. Depression  PLAN  1. Evolving Large Acute CVA at the Medial Distribution of the R Anterior Cerebral Artery along the Frontal Lobes likely 2/2 to Failure of Collaterals with subsequent Ischemia and Right A2 Occulsion.  -Neurology Stroke Team Consulted and Appreciate Recommendations. Discussed Case with Dr. Erlinda Hong and patient can be D/C'd and follow up with PCP in 1 week and Neurology in 6 weeks.  -CT Head showed New evolving acute infarct at the medial distribution of the  right anterior cerebral artery along the right frontal lobe.This is much larger than the previously noted  small punctate infarcts in this region. Mild associated mass effect, without significant midline shift.  Mild cortical volume loss noted.  -MRI of Brain showed Large right ACA territory infarct has developed since 07/30/2016. No evidence of hemorrhagic transformation. No significant intracranial mass effect. No other vascular territory ischemia identified. -Carotid Dopplers Done 07/30/16 -Transthoracic Echocardiogram done showed Left ventricle: The cavity size was normal. Wall thickness wasincreased in a pattern of moderate LVH. Systolic function was normal. The estimated ejection fraction was in the range of 60%to 65%. Wall motion was normal; there were no regional wallmotion abnormalities. Doppler parameters are consistent withabnormal left ventricular relaxation (grade 1 diastolicdysfunction). -Lipid Panel showed Cholesterol of 202, HDL of 28, LDL of 122, TG of 259 -HbA1c 8.3% -C/w Plavix 75 mg po Daily and Pravastatin 80 mg po qHS -PT recommended Home PT and OT reccommends Home OT with no Equipment Recommendations  2. Hyperglycemia in the setting of Insulin Dependent Diabetes Mellitus Type 2 -Heart Healthy/Carb Modified Diet -C/w Lantus 70 units sq at bedtime and Home SSI -HbA1c 8.3 -Monitor for Signs of Hypoglycemia  3. Leukocytosis  -Patients WBC went from 24.9 -> 30.1 -> 25.4 -> 23.5 -> 24.4 -Possibly from Prednisone, however patient had Atypical Lymphocytes and Mononuclear Cells -Peripheral Smear obtained. -Discussed Case with Hematology/Oncology Dr. Lindi Adie and recommended obtaining Flow Cytometry for CLL as Dr. Lindi Adie was concerned that she could possibly have CLL -Will see Patient as an outpatient once Flow Cytometry is done  4. UTI, poA from previous Admission (incomplete Tx) -Patient Immunosuppressed because of Diabetes -UTI on previous Admission/Discharge was treated with Ciprofloxacin 500 mg po BID -Patient states she has symptoms of burning still -UA was Cloudy  with Many UA Bacteria, Moderate Leukocytes, TNTC WBC as well as Yeast Present -Urine C/x showed Klebsiella Pneumoniae on 07/29/16 -Completed 5 Day Treatment Course with Ceftriaxone 1 gram q24h (previously on Ciprofloxacin 500 mg po BID) -Started Fluconazole 200 mg po x 5 days for Yeast in Urine  5. Microscopic Hematuria  -Large Blood found in Ua; No Gross Hematuria -Likely 2/2 to UTI -Continue to Monitor; If still Present will need Urologic Eval -Repeat U/A at PCP's office  6. Occipital Neuralgia -Restarted Ultram 50 mg po q6hprn today -Neurology did not do Occipital Block yesterday as patient's Sx improved -Neurology to Do Occipital Block today prior to D/C as patient was complaining of Headache again  7. Mild Hyponatremia -Patients' Na+ was 134 -Repeat CMP as an an outpatient.   8. Hypertension -Allowed for Permissive HTN. -Add Home Antihypertensive Medications slowly  9. Hyperlipidemia -Lipid Panel showed Cholesterol of 202, HDL of 28, LDL of 122, TG of 259 -Patient was started on Pravastatin 80 mg po qHS and Gemfibrozil 600 mg po BID  10. GERD -Continue with Famotidine 20 mg po qHS and with Pantoprazole 40 mg po ACB  11. OAB -Continue with Oxybutynin 10 mg po ACB  12. Restless Leg Syndrome -Continue with Pramipexole 0.25  13. Depression -C/w Citalopram 20 mg po Daily  Discharge Instructions  Discharge Instructions    Call MD for:  difficulty breathing, headache or visual disturbances    Complete by:  As directed    Call MD for:  persistant dizziness or light-headedness    Complete by:  As directed    Call MD for:  persistant nausea and vomiting    Complete by:  As directed    Diet -  low sodium heart healthy    Complete by:  As directed    Increase activity slowly    Complete by:  As directed        Medication List    STOP taking these medications   ciprofloxacin 500 MG tablet Commonly known as:  CIPRO     TAKE these medications    acetaminophen 650 MG CR tablet Commonly known as:  TYLENOL Take 1,300 mg by mouth 2 (two) times daily.   amLODipine 5 MG tablet Commonly known as:  NORVASC Take 5 mg by mouth daily.   benzonatate 200 MG capsule Commonly known as:  TESSALON Take 200 mg by mouth 3 (three) times daily as needed for cough.   citalopram 20 MG tablet Commonly known as:  CELEXA Take 20 mg by mouth daily.   clopidogrel 75 MG tablet Commonly known as:  PLAVIX Take 1 tablet (75 mg total) by mouth daily.   clotrimazole 1 % cream Commonly known as:  LOTRIMIN Apply 1 application topically 2 (two) times daily as needed. For rash   cyanocobalamin 1000 MCG/ML injection Commonly known as:  (VITAMIN B-12) Inject 1,000 mcg into the muscle every 30 (thirty) days. Usually on the 1st   fluconazole 200 MG tablet Commonly known as:  DIFLUCAN Take 1 tablet (200 mg total) by mouth daily. Start taking on:  08/05/2016   gemfibrozil 600 MG tablet Commonly known as:  LOPID Take 600 mg by mouth 2 (two) times daily before a meal.   glipiZIDE 5 MG 24 hr tablet Commonly known as:  GLUCOTROL XL Take 5 mg by mouth daily.   HYDROcodone-homatropine 5-1.5 MG/5ML syrup Commonly known as:  HYCODAN Take 5 mLs by mouth every 4 (four) hours as needed for cough.   insulin glargine 100 UNIT/ML injection Commonly known as:  LANTUS Inject 70 Units into the skin at bedtime.   losartan-hydrochlorothiazide 100-12.5 MG tablet Commonly known as:  HYZAAR Take 1 tablet by mouth daily.   metFORMIN 1000 MG tablet Commonly known as:  GLUCOPHAGE Take 1,000 mg by mouth 2 (two) times daily with a meal.   NOVOLOG FLEXPEN 100 UNIT/ML injection Generic drug:  insulin aspart Inject 12 Units into the skin 3 (three) times daily before meals.   oxybutynin 10 MG 24 hr tablet Commonly known as:  DITROPAN-XL Take 10 mg by mouth daily.   pantoprazole 40 MG tablet Commonly known as:  PROTONIX Take 40 mg by mouth daily.   pramipexole  0.25 MG tablet Commonly known as:  MIRAPEX Take 0.25 mg by mouth daily.   pravastatin 80 MG tablet Commonly known as:  PRAVACHOL Take 1 tablet (80 mg total) by mouth daily.   ranitidine 150 MG tablet Commonly known as:  ZANTAC Take 150 mg by mouth at bedtime.   traMADol 50 MG tablet Commonly known as:  ULTRAM Take 1 tablet (50 mg total) by mouth every 6 (six) hours as needed for moderate pain.       Allergies  Allergen Reactions  . Meloxicam Other (See Comments)    Stomach pains    Consultations: - Neurology Stroke Team Dr. Erlinda Hong  Procedures/Studies: Dg Chest 2 View  Result Date: 08/02/2016 CLINICAL DATA:  75 year old female with history of diabetes and hypertension. EXAM: CHEST  2 VIEW COMPARISON:  Chest x-ray 11/10/2015. FINDINGS: Lung volumes are normal. No consolidative airspace disease. No pleural effusions. No pneumothorax. No pulmonary nodule or mass noted. Pulmonary vasculature and the cardiomediastinal silhouette are within normal limits. Orthopedic fixation hardware  in the lower cervical spine. Postoperative changes of left shoulder arthroplasty incompletely visualized. IMPRESSION: 1. No radiographic evidence of acute cardiopulmonary disease. Electronically Signed   By: Vinnie Langton M.D.   On: 08/02/2016 12:00   Ct Head Wo Contrast  Result Date: 08/01/2016 CLINICAL DATA:  Subacute onset of left-sided weakness. Initial encounter. EXAM: CT HEAD WITHOUT CONTRAST TECHNIQUE: Contiguous axial images were obtained from the base of the skull through the vertex without intravenous contrast. COMPARISON:  MRI of the brain performed 07/30/2016, and CT of the head performed 07/29/2016 FINDINGS: Brain: There is a new evolving acute infarct at the medial distribution of the right anterior cerebral artery, along the right frontal lobe. This is much larger than the previously noted small punctate infarcts in this region. Mild associated mass effect is noted, without evidence of  significant midline shift. Prominence of the ventricles and sulci reflects mild cortical volume loss. There is no evidence of hemorrhagic transformation, hydrocephalus or mass lesion. The brainstem and fourth ventricle are within normal limits. The basal ganglia are unremarkable in appearance. Vascular: No hyperdense vessel or unexpected calcification. Skull: There is no evidence of fracture; visualized osseous structures are unremarkable in appearance. Sinuses/Orbits: The orbits are within normal limits. The paranasal sinuses and mastoid air cells are well-aerated. Other: No significant soft tissue abnormalities are seen. IMPRESSION: 1. 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 This is much larger than the previously noted small punctate infarcts in this region. Mild associated mass effect, without significant midline shift. 2. Mild cortical volume loss noted. These results were called by telephone at the time of interpretation on 08/01/2016 at 9:32 pm to [facility], who verbally acknowledged these results. Electronically Signed   By: Garald Balding M.D.   On: 08/01/2016 21:33   Ct Head Wo Contrast  Result Date: 07/29/2016 CLINICAL DATA:  75 y/o F; 30 minutes of altered mental status today. EXAM: CT HEAD WITHOUT CONTRAST TECHNIQUE: Contiguous axial images were obtained from the base of the skull through the vertex without intravenous contrast. COMPARISON:  05/03/2011 CT head. FINDINGS: Brain: No evidence of acute infarction, hemorrhage, hydrocephalus, extra-axial collection or mass lesion/mass effect. Chronic microvascular ischemic changes and parenchymal volume loss are mildly progressed from 2012. Vascular: No hyperdense vessel. Calcific atherosclerosis of cavernous internal carotid artery is and vertebral arteries. Skull: Normal. Negative for fracture or focal lesion. Sinuses/Orbits: No acute finding. Other: None. IMPRESSION:  No acute intracranial abnormality is identified. Mild chronic microvascular ischemic changes and parenchymal volume loss are progressed from 2012. Electronically Signed   By: Kristine Garbe M.D.   On: 07/29/2016 18:34   Mr Brain Wo Contrast  Result Date: 07/30/2016 CLINICAL DATA:  30 minute episode altered mental status, concurrent hyperglycemia secondary to steroid use for inflamed ganglion cyst. History of hypertension, diabetes. LEFT transient ischemic attack. EXAM: MRI HEAD WITHOUT CONTRAST MRA HEAD WITHOUT CONTRAST TECHNIQUE: Multiplanar, multiecho pulse sequences of the brain and surrounding structures were obtained without intravenous contrast. Angiographic images of the head were obtained using MRA technique without contrast. COMPARISON:  CT HEAD July 29, 2016 FINDINGS: MRI HEAD FINDINGS BRAIN: Punctate foci of reduced diffusion RIGHT frontal lobe/ anterior genu of the corpus callosum and RIGHT centrum semiovale. No susceptibility artifact to suggest hemorrhage. Patchy supratentorial pontine white matter FLAIR T2 hyperintensities compatible with mild chronic small vessel ischemic disease, less than expected for age. The ventricles and sulci are normal for patient's age. No  suspicious parenchymal signal, masses or mass effect. No abnormal extra-axial fluid collections. No extra-axial masses though, contrast enhanced sequences would be more sensitive. VASCULAR: Normal major intracranial vascular flow voids present at skull base. SKULL AND UPPER CERVICAL SPINE: No abnormal sellar expansion. No suspicious calvarial bone marrow signal. Craniocervical junction maintained. SINUSES/ORBITS: The mastoid air-cells and included paranasal sinuses are well-aerated. Status post bilateral ocular lens implants. The included ocular globes and orbital contents are non-suspicious. OTHER: Patient is edentulous. MRA HEAD FINDINGS- mildly motion degraded examination. ANTERIOR CIRCULATION: Normal flow related  enhancement of the included cervical, petrous, cavernous and supraclinoid internal carotid arteries. 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 Patent anterior communicating artery. Occluded proximal RIGHT A2 segment. Moderate tandem stenosis bilateral mid to distal anterior middle cerebral arteries. No high-grade stenosis, abnormal luminal irregularity. POSTERIOR CIRCULATION: Codominant vertebral artery's. Basilar artery is patent, with normal flow related enhancement of the main branch vessels. Normal flow related enhancement of the posterior cerebral arteries. Mild stenosis proximal LEFT P2 segment. Moderate stenosis proximal LEFT P3 segment . No large vessel occlusion, high-grade stenosis, abnormal luminal irregularity, aneurysm. IMPRESSION: MRI HEAD: 2 punctate acute infarcts RIGHT frontal lobe. Otherwise negative MRI of the head for age. MRA HEAD: Probably acute occlusion RIGHT anterior cerebral artery at A2 segment. Moderate intracranial atherosclerosis. 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 These results will be called to the ordering clinician or representative by the Radiologist Assistant, and communication documented in the PACS or zVision Dashboard. Electronically Signed   By: Elon Alas M.D.   On: 07/30/2016 06:08   Mr Brain Ltd W/o Cm  Result Date: 08/02/2016 CLINICAL DATA:  75 year old female with suspected punctate lacunar infarcts in the right hemisphere on recent MRI done for altered mental status. Neurology request for repeat diffusion-weighted imaging only. Recent hyperglycemia. EXAM: MRI HEAD WITHOUT CONTRAST LIMITED TECHNIQUE: Multiplanar, multiecho pulse sequences of the brain and surrounding structures were obtained without intravenous contrast. COMPARISON:  Brain MRI and intracranial MRA 07/30/2016. FINDINGS: Axial and coronal diffusion weighted imaging was performed. Axial susceptibility weighted imaging was also  performed. There is now all confluent restricted diffusion throughout the right ACA territory extending over an area of 6-7 cm of the right corpus callosum, cingulate gyrus, and medial aspect of the right superior frontal gyrus. No restricted diffusion elsewhere. No hemorrhagic transformation on susceptibility weighted imaging. No significant intracranial mass effect. IMPRESSION: 1. Large right ACA territory infarct has developed since 07/30/2016. No evidence of hemorrhagic transformation. No significant intracranial mass effect. 2. No other vascular territory ischemia identified. Electronically Signed   By: Genevie Ann M.D.   On: 08/02/2016 11:59   Mr Jodene Nam Head/brain [id number] Cm  Result Date: 07/30/2016 CLINICAL DATA:  30 minute episode altered mental status, concurrent hyperglycemia secondary to steroid use for inflamed ganglion cyst. History of hypertension, diabetes. LEFT transient ischemic attack. EXAM: MRI HEAD WITHOUT CONTRAST MRA HEAD WITHOUT CONTRAST TECHNIQUE: Multiplanar, multiecho pulse sequences of the brain and surrounding structures were obtained without intravenous contrast. Angiographic images of the head were obtained using MRA technique without contrast. COMPARISON:  CT HEAD July 29, 2016 FINDINGS: MRI HEAD FINDINGS BRAIN: Punctate foci of reduced diffusion RIGHT frontal lobe/ anterior genu of the corpus callosum and RIGHT centrum semiovale. No susceptibility artifact to suggest hemorrhage. Patchy supratentorial pontine white matter FLAIR T2 hyperintensities compatible with mild chronic small vessel ischemic disease, less than expected for age. The ventricles and sulci are normal for patient's age. No suspicious parenchymal  signal, masses or mass effect. No abnormal extra-axial fluid collections. No extra-axial masses though, contrast enhanced sequences would be more sensitive. VASCULAR: Normal major intracranial vascular flow voids present at skull base. SKULL AND UPPER CERVICAL SPINE: No abnormal  sellar expansion. No suspicious calvarial bone marrow signal. Craniocervical junction maintained. SINUSES/ORBITS: The mastoid air-cells and included paranasal sinuses are well-aerated. Status post bilateral ocular lens implants. The included ocular globes and orbital contents are non-suspicious. OTHER: Patient is edentulous. MRA HEAD FINDINGS- mildly motion degraded examination. ANTERIOR CIRCULATION: Normal flow related enhancement of the included cervical, petrous, cavernous and supraclinoid internal carotid arteries. 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 Patent anterior communicating artery. Occluded proximal RIGHT A2 segment. Moderate tandem stenosis bilateral mid to distal anterior middle cerebral arteries. No high-grade stenosis, abnormal luminal irregularity. POSTERIOR CIRCULATION: Codominant vertebral artery's. Basilar artery is patent, with normal flow related enhancement of the main branch vessels. Normal flow related enhancement of the posterior cerebral arteries. Mild stenosis proximal LEFT P2 segment. Moderate stenosis proximal LEFT P3 segment . No large vessel occlusion, high-grade stenosis, abnormal luminal irregularity, aneurysm. IMPRESSION: MRI HEAD: 2 punctate acute infarcts RIGHT frontal lobe. Otherwise negative MRI of the head for age. MRA HEAD: Probably acute occlusion RIGHT anterior cerebral artery at A2 segment. Moderate intracranial atherosclerosis. 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 These results will be called to the ordering clinician or representative by the Radiologist Assistant, and communication documented in the PACS or zVision Dashboard. Electronically Signed   By: Elon Alas M.D.   On: 07/30/2016 06:08     ECHOCARDIOGRAM:  - Left ventricle: The cavity size was normal. Wall thickness was   increased in a pattern of moderate LVH. Systolic function was   normal. The estimated ejection fraction was in  the range of 60%   to 65%. Wall motion was normal; there were no regional wall   motion abnormalities. Doppler parameters are consistent with   abnormal left ventricular relaxation (grade 1 diastolic   dysfunction). - Mitral valve: Calcified annulus. Moderately thickened leaflets . - Left atrium: The atrium was mildly dilated. - Atrial septum: No defect or patent foramen ovale was identified. - Pulmonary arteries: PA peak pressure: 35 mm Hg (S).  Subjective: Patient was seen and examined at bedside today and was doing well. Wanted to go home. States she has been working hard with Therapy. No Cp, SOB, Lightheadedness or Dizziness. No other complaints or concerns.  Discharge Exam: Vitals:   08/04/16 0941 08/04/16 1413  BP: (!) 144/56 (!) 123/52  Pulse: 72 78  Resp: 20 20  Temp: 98.1 F (36.7 C) 98.7 F (37.1 C)   Vitals:   08/04/16 0114 08/04/16 0629 08/04/16 0941 08/04/16 1413  BP: (!) 154/53 (!) 160/58 (!) 144/56 (!) 123/52  Pulse: 63 83 72 78  Resp: 16 18 20 20   Temp: 97.9 F (36.6 C) 98 F (36.7 C) 98.1 F (36.7 C) 98.7 F (37.1 C)  TempSrc: Oral Oral Oral Oral  SpO2: 95% 97% 96% 96%  Weight:      Height:        General: Pt is an obese WN/WD Caucasian female who is alert, awake, not in acute distress Cardiovascular: RRR, S1/S2 +, no rubs, no gallops Respiratory: CTA bilaterally, no wheezing, no rhonchi Abdominal: Soft, NT, Distended 2/2 to body habitus, bowel sounds + Extremities: no edema, no cyanosis Neurological: Mild Left hand Weakness appreciated. CN 2-12 grossly intact.   The results of  significant diagnostics from this hospitalization (including imaging, microbiology, ancillary and laboratory) are listed below for reference.    Microbiology: Recent Results (from the past 240 hour(s))  Urine culture     Status: Abnormal   Collection Time: 07/29/16  7:30 PM  Result Value Ref Range Status   Specimen Description   Final    URINE, RANDOM Performed at [facility] Requests NONE  Final   Culture >=100,000 COLONIES/mL KLEBSIELLA PNEUMONIAE (A)  Final   Report Status 08/01/2016 FINAL  Final   Organism ID, Bacteria KLEBSIELLA PNEUMONIAE (A)  Final      Susceptibility   Klebsiella pneumoniae - MIC*    AMPICILLIN >=32 RESISTANT Resistant     CEFAZOLIN <=4 SENSITIVE Sensitive     CEFTRIAXONE <=1 SENSITIVE Sensitive     CIPROFLOXACIN <=0.25 SENSITIVE Sensitive     GENTAMICIN <=1 SENSITIVE Sensitive     IMIPENEM <=0.25 SENSITIVE Sensitive     NITROFURANTOIN 64 INTERMEDIATE Intermediate     TRIMETH/SULFA <=20 SENSITIVE Sensitive     AMPICILLIN/SULBACTAM 4 SENSITIVE Sensitive     PIP/TAZO <=4 SENSITIVE Sensitive     Extended ESBL NEGATIVE Sensitive     * >=100,000 COLONIES/mL KLEBSIELLA PNEUMONIAE     Labs: BNP (last 3 results) No results for input(s): BNP in the last 8760 hours. Basic Metabolic Panel:  Recent Labs Lab 07/31/16 0510 08/01/16 1734 08/02/16 0823 08/03/16 0250 08/04/16 0641  NA 136 131* 131* 137 134*  K 4.2 4.5 4.2 4.0 4.4  CL 102 97* 97* 104 98*  CO2 26 22 22 25 24   GLUCOSE 235* 310* 307* 196* 200*  BUN 23* 28* 23* 21* 16  CREATININE 0.81 1.03* 0.85 0.73 0.73  CALCIUM 9.2 9.1 8.7* 9.1 9.5  MG  --   --  2.0 2.2 2.1  PHOS  --   --  2.6 3.4  --    Liver Function Tests:  Recent Labs Lab 07/29/16 1800 08/02/16 0823 08/03/16 0250 08/04/16 0641  AST 28 32 33 38  ALT 21 33 38 43  ALKPHOS 54 69 66 84  BILITOT 0.4 0.5 0.5 0.4  PROT 7.3 6.9 7.0 7.4  ALBUMIN 3.7 3.5 3.4* 3.6   No results for input(s): LIPASE, AMYLASE in the last 168 hours. No results for input(s): AMMONIA in the last 168 hours. CBC:  Recent Labs Lab 07/29/16 1800  07/31/16 0510 08/01/16 1734 08/02/16 0823 08/03/16 0250 08/04/16 0641  WBC 27.1*  < > 24.9* 30.1* 25.4* 23.5* 24.4*  NEUTROABS 6.8  --   --   --  4.3 3.3 3.4  HGB 10.1*  < > 10.4* 11.1* 10.4* 10.1* 10.6*  HCT 32.9*  < > 34.9* 37.0 33.7* 33.7* 34.8*  MCV  83.7  < > 83.9 85.3 84.0 85.3 83.7  PLT 392  < > 348 422* 354 313 321  < > = values in this interval not displayed. Cardiac Enzymes:  Recent Labs Lab 07/29/16 1800  TROPONINI <0.03   BNP: Invalid input(s): POCBNP CBG:  Recent Labs Lab 08/03/16 1127 08/03/16 1621 08/03/16 2210 08/04/16 0632 08/04/16 1116  GLUCAP 293* 188* 251* 174* 214*   D-Dimer No results for input(s): DDIMER in the last 72 hours. Hgb A1c No results for input(s): HGBA1C in the last 72 hours. Lipid Profile No results for input(s): CHOL, HDL, LDLCALC, TRIG, CHOLHDL, LDLDIRECT in the last 72 hours. Thyroid function studies No results for input(s): TSH, T4TOTAL, T3FREE, THYROIDAB in the last 72  hours.  Invalid input(s): FREET3 Anemia work up No results for input(s): VITAMINB12, FOLATE, FERRITIN, TIBC, IRON, RETICCTPCT in the last 72 hours. Urinalysis    Component Value Date/Time   COLORURINE YELLOW 08/03/2016 0417   APPEARANCEUR CLOUDY (A) 08/03/2016 0417   LABSPEC 1.021 08/03/2016 0417   PHURINE 5.5 08/03/2016 0417   GLUCOSEU NEGATIVE 08/03/2016 0417   HGBUR LARGE (A) 08/03/2016 0417   BILIRUBINUR NEGATIVE 08/03/2016 0417   KETONESUR NEGATIVE 08/03/2016 0417   PROTEINUR NEGATIVE 08/03/2016 0417   UROBILINOGEN 0.2 11/10/2011 1435   NITRITE NEGATIVE 08/03/2016 0417   LEUKOCYTESUR MODERATE (A) 08/03/2016 0417   Sepsis Labs Invalid input(s): PROCALCITONIN,  WBC,  LACTICIDVEN Microbiology Recent Results (from the past 240 hour(s))  Urine culture     Status: Abnormal   Collection Time: 07/29/16  7:30 PM  Result Value Ref Range Status   Specimen Description   Final    URINE, RANDOM Performed at [facility] Requests NONE  Final   Culture >=100,000 COLONIES/mL KLEBSIELLA PNEUMONIAE (A)  Final   Report Status 08/01/2016 FINAL  Final   Organism ID, Bacteria KLEBSIELLA PNEUMONIAE (A)  Final      Susceptibility   Klebsiella pneumoniae - MIC*    AMPICILLIN >=32 RESISTANT  Resistant     CEFAZOLIN <=4 SENSITIVE Sensitive     CEFTRIAXONE <=1 SENSITIVE Sensitive     CIPROFLOXACIN <=0.25 SENSITIVE Sensitive     GENTAMICIN <=1 SENSITIVE Sensitive     IMIPENEM <=0.25 SENSITIVE Sensitive     NITROFURANTOIN 64 INTERMEDIATE Intermediate     TRIMETH/SULFA <=20 SENSITIVE Sensitive     AMPICILLIN/SULBACTAM 4 SENSITIVE Sensitive     PIP/TAZO <=4 SENSITIVE Sensitive     Extended ESBL NEGATIVE Sensitive     * >=100,000 COLONIES/mL KLEBSIELLA PNEUMONIAE   Time coordinating discharge: Over 30 minutes  SIGNED:  Kerney Elbe, DO [facility] 08/04/2016, 2:37 PM Pager [phone]  If 7PM-7AM, please contact night-coverage [email or web address] Password [email or web address]

## 2016-08-04 NOTE — Progress Notes (Signed)
Inpatient Diabetes Program Recommendations  AACE/ADA: New Consensus Statement on Inpatient Glycemic Control (2015)  Target Ranges:  Prepandial:   less than 140 mg/dL      Peak postprandial:   less than 180 mg/dL (1-2 hours)      Critically ill patients:  140 - 180 mg/dL   Lab Results  Component Value Date   GLUCAP 174 (H) 08/04/2016   HGBA1C 8.3 (H) 07/30/2016    Review of Glycemic Control:  Results for VERNESSA, GAILES (MRN [id number]) as of 08/04/2016 10:52  Ref. Range 08/03/2016 06:42 08/03/2016 11:27 08/03/2016 16:21 08/03/2016 22:10 08/04/2016 06:32  Glucose-Capillary Latest Ref Range: 65 - 99 mg/dL 241 (H) 293 (H) 188 (H) 251 (H) 174 (H)   Diabetes history: Type 2 diabetes Outpatient Diabetes medications: Glucotrol XL 5 mg daily, Novolog 12 units tid with meals, Lantus 70 units q HS, Metformin 1000 mg bid Current orders for Inpatient glycemic control:  Novolog moderate tid with meals and HS, Lantus 70 units q HS  Inpatient Diabetes Program Recommendations:   Please consider adding Novolog 8 units tid with meals (Hold if patient eats less than 50%).  Thanks, Adah Perl, RN, BC-ADM Inpatient Diabetes Coordinator Pager [phone] (8a-5p)

## 2016-08-04 NOTE — Care Management Important Message (Signed)
Important Message  Patient Details  Name: Theresa Kramer MRN: [id number] Date of Birth: Jun 14, 1941   Medicare Important Message Given:  Yes    Theresa Kramer 08/04/2016, 1:16 PM

## 2016-08-04 NOTE — Evaluation (Signed)
Speech Language Pathology Evaluation Patient Details Name: Theresa Kramer MRN: [id number] DOB: 11/01/1941 Today's Date: 08/04/2016 Time: [id number] SLP Time Calculation (min) (ACUTE ONLY): 42 min  Problem List:  Patient Active Problem List   Diagnosis Date Noted  . Occipital neuralgia   . Acute ischemic stroke ([place])   . Stroke ([place])   . 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 Leukocytosis 08/01/2016  . Urinary tract infectious disease 08/01/2016  . Type 2 diabetes mellitus without complication, with long-term current use of insulin ([place]) 07/30/2016  . Essential hypertension 07/30/2016  . Osteoarthritis 07/30/2016  . Normocytic anemia 07/30/2016  . HLD (hyperlipidemia)   . Schatzki's ring   . Cervical herniated disc 03/15/2012  . GERD 04/29/2010  . CHEST PAIN, ATYPICAL 04/29/2010  . DYSPHAGIA UNSPECIFIED 04/29/2010   Past Medical History:  Past Medical History:  Diagnosis Date  . Arthritis   . B12 deficiency   . Bronchitis    hx of  . Complication of anesthesia   . Depression    "not taking medication"  . Diabetes mellitus   . GERD (gastroesophageal reflux disease)   . H/O hiatal hernia   . Hyperlipidemia   . Hypertension   . Noncompliance with medication regimen   . Pinched cervical nerve root   . PONV (postoperative nausea and vomiting)   . Urinary tract infection    hx of   Past Surgical History:  Past Surgical History:  Procedure Laterality Date  . ABDOMINAL HYSTERECTOMY    . ANTERIOR CERVICAL DECOMP/DISCECTOMY FUSION  03/15/2012   Procedure: ANTERIOR CERVICAL DECOMPRESSION/DISCECTOMY FUSION 2 LEVELS;  Surgeon: Ophelia Charter, MD;  Location: [facility];  Service: Neurosurgery;  Laterality: N/A;  Cervical four-five Cervical five six anterior cervical decompression with fusion interbody prothesis plating and bonegraft  . APPENDECTOMY    . BONE CYST EXCISION     from lower back and  foot  . CHOLECYSTECTOMY    . COLONOSCOPY  2011   Dr. Gala Romney: prep compromised exam. diverticulosis, hemorrhoids. next tcs 2021  . DILATION AND CURETTAGE OF UTERUS    . 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 Multiple distal rings dilated and disrupted as described above. Small hiatal hernia.   Marland Kitchen EXCISION METACARPAL MASS Left 03/03/2016   Procedure: EXCISION OF LEFT  DORSAL MASS OF EXTENSOR TENDON;  Surgeon: Milly Jakob, MD;  Location: [facility];  Service: Orthopedics;  Laterality: Left;  . EYE SURGERY     bilateral cataract surgery,   . HIP ARTHROPLASTY     right  . JOINT REPLACEMENT    . KNEE SURGERY     torn cartilage repair  . MALONEY DILATION N/A 09/19/2014   Procedure: Venia Minks DILATION;  Surgeon: Daneil Dolin, MD;  [facility];  Service: Endoscopy;  Laterality: N/A;  . ORTHOPEDIC SURGERY    . ROTATOR CUFF REPAIR     "multiple in both shoulders" and total shoulder replacement in left arm  . SAVORY DILATION N/A 09/19/2014   Procedure: SAVORY DILATION;  Surgeon: Daneil Dolin, MD;  [facility];  Service: Endoscopy;  Laterality: N/A;  . TONSILLECTOMY     HPI:  75 yo adm with left sided weakness, found to have evolving right frontal cva and ? UTI.  Pt with recent TIAs per son Octavia Bruckner.  Pt resides with daughter [name] and takes care of her own puppy, cleans  her own room and does some cooking.  Her daughter [name] manages finances and pt's appointments, medications, etc.     Assessment / Plan / Recommendation Clinical Impression  Most of MOCA administered (except mental subtraction) with pt demonstrating severe cognitive lingusitic deficits.  Pt scored 13/27 with areas of strength including naming, attention and language.  She does appear to have a flat affect, but uncertain if this is her baseline prosody.  Her speech is fluent without dysarthria nor word finding deficits  observed.  Visuospatial, memory and abstract thought were challenging for pt.  Pt has supportive environment per family (neighbors available to help prn) and daughter has flexible work schedule to accommodate patient.  Per review of OT note, pt with baseline cogntiive deficits and daughter reported she was at baseline.  Given her supportive environment and family denial of changes, no follow up indicated.  SLP educated pt and family to findings and provided written compensation strategies.  Thanks for this referral.     SLP Assessment  Patient does not need any further Speech Lanaguage Pathology Services    Follow Up Recommendations  None    Frequency and Duration   n/a        SLP Evaluation Cognition  Overall Cognitive Status:  (per OT note, pt with baseline cognitive deficits per daughter) Arousal/Alertness: Awake/alert Orientation Level: Oriented to person;Oriented to situation;Oriented to place;Other (comment) (to date with use of clock) Attention: Sustained Sustained Attention: Appears intact Memory: Impaired Memory Impairment: Storage deficit;Retrieval deficit Awareness: Appears intact Problem Solving: Appears intact (pt used her watch to correct her drawn clock, she also guessed at items with memory task to improve chance of being correct)       Comprehension  Auditory Comprehension Overall Auditory Comprehension: Appears within functional limits for tasks assessed Yes/No Questions: Not tested Commands: Within Functional Limits Conversation: Simple Other Conversation Comments: pt answered questions but was not excessively verbose during testing, her son Octavia Bruckner provided detailed information EffectiveTechniques: Extra processing time Visual Recognition/Discrimination Discrimination: Not tested Reading Comprehension Reading Status: Within funtional limits (for tasks completed)    Expression Expression Primary Mode of Expression: Verbal Verbal Expression Overall Verbal  Expression: Appears within functional limits for tasks assessed Initiation: No impairment Level of Generative/Spontaneous Verbalization: Sentence Repetition: No impairment Naming: No impairment Pragmatics:  (? flat affect or if baseline prosody pattern) Non-Verbal Means of Communication: Not applicable Written Expression Dominant Hand: Right Written Expression: Not tested   Oral / Motor  Oral Motor/Sensory Function Overall Oral Motor/Sensory Function: Within functional limits Motor Speech Overall Motor Speech: Appears within functional limits for tasks assessed Phonation: Normal Resonance: Within functional limits Articulation: Within functional limitis Intelligibility: Intelligible Motor Planning: Witnin functional limits Motor Speech Errors: Not applicable   GO                    Claudie Fisherman, [place] [facility] SLP [phone]

## 2016-08-04 NOTE — Care Management Note (Signed)
Case Management Note  Patient Details  Name: Theresa Kramer MRN: [id number] Date of Birth: 04/05/1941  Subjective/Objective:                    Action/Plan: Pt discharging home with orders for [facility] services. CM met with the patient and her daughter and provided them a list of [place] agencies in the [place] area. They selected [place]. Santiago Glad with [facility] notified and accepted the referral. Will update the bedside RN.   Expected Discharge Date:                  Expected Discharge Plan:  [place]  In-House Referral:     Discharge planning Services  CM Consult  Post Acute Care Choice:  Home Health Choice offered to:  Adult Children  DME Arranged:    DME Agency:     HH Arranged:  PT, OT, Nurse's Aide [place] Agency:  [facility]  Status of Service:  Completed, signed off  If discussed at [place] of Stay Meetings, dates discussed:    Additional Comments:  Pollie Friar, RN 08/04/2016, 4:09 PM

## 2016-08-04 NOTE — Progress Notes (Signed)
Patient discharged via wheelchair.  Patient in stable condition.  Discharge instructions read to patient & family, patient and family verbalized understanding.  All personal belongings sent with patient.

## 2016-08-04 NOTE — Progress Notes (Signed)
Physical Therapy Treatment Patient Details Name: Theresa Kramer MRN: [id number] DOB: 08-30-1941 Today's Date: 08/04/2016    History of Present Illness pt is a 75 y/o female with ;mh significant for HTN, IDDM who presents with left sided weakness.  MRI shows large R ACA territory infarct.    PT Comments    Pt progressing steadily.  Should be safe in the home with available assist provided when daughter is at work.  Suggested BSC for toileting at night or at the very least a night light, which daughter states pt refuses.  Follow Up Recommendations  Home health PT     Equipment Recommendations  None recommended by PT    Recommendations for Other Services       Precautions / Restrictions Precautions Precautions: Fall Restrictions Weight Bearing Restrictions: No    Mobility  Bed Mobility Overal bed mobility: Needs Assistance Bed Mobility: Supine to Sit   Sidelying to sit: Min guard       General bed mobility comments: cues to use L UE more to assist  Transfers Overall transfer level: Needs assistance Equipment used: None Transfers: Sit to/from Stand Sit to Stand: Min guard            Ambulation/Gait Ambulation/Gait assistance: Supervision Ambulation Distance (Feet): 230 Feet Assistive device: None Gait Pattern/deviations: Step-through pattern Gait velocity: slower Gait velocity interpretation: Below normal speed for age/gender General Gait Details: generally steady today without AD, though with periods of instability.  More "wobble" with fatigue and her hip pain.   Stairs Stairs: Yes Stairs assistance: Min assist Stair Management: One rail Right;Alternating pattern;Forwards Number of Stairs: 4 General stair comments: staggered with stepping up on the left due to not quite clearing the step, otherwise she stepped up and down safely with the rail  Wheelchair Mobility    Modified Rankin (Stroke Patients Only) Modified Rankin (Stroke Patients  Only) Pre-Morbid Rankin Score: No significant disability Modified Rankin: Moderate disability     Balance Overall balance assessment: Needs assistance Sitting-balance support: No upper extremity supported Sitting balance-Leahy Scale: Fair     Standing balance support: No upper extremity supported Standing balance-Leahy Scale: Fair                      Cognition Arousal/Alertness: Awake/alert Behavior During Therapy: Flat affect Overall Cognitive Status: History of cognitive impairments - at baseline                      Exercises      General Comments General comments (skin integrity, edema, etc.): DGI not complete, but suggest by the scores that pt is a risk for fall.      Pertinent Vitals/Pain Pain Assessment: Faces Faces Pain Scale: Hurts even more Pain Location: R hip/leg Pain Descriptors / Indicators: Shooting Pain Intervention(s): Monitored during session;Repositioned    Home Living                      Prior Function            PT Goals (current goals can now be found in the care plan section) Acute Rehab PT Goals Patient Stated Goal: go home PT Goal Formulation: With patient Time For Goal Achievement: 08/16/16 Potential to Achieve Goals: Good Progress towards PT goals: Progressing toward goals    Frequency    Min 3X/week      PT Plan Current plan remains appropriate    Co-evaluation  End of Session   Activity Tolerance: Patient tolerated treatment well;Patient limited by pain Patient left: with call bell/phone within reach;in chair;with chair alarm set     Time: [id number] PT Time Calculation (min) (ACUTE ONLY): 24 min  Charges:  $Gait Training: 8-22 mins $Therapeutic Activity: 8-22 mins                    G Codes:      [name], [name] 08/04/2016, 3:47 PM 08/04/2016  Donnella Sham, PT [phone]  (pager)

## 2016-08-04 NOTE — Progress Notes (Addendum)
STROKE TEAM PROGRESS NOTE   SUBJECTIVE (INTERVAL HISTORY) Her daughter is at the bedside. She had occipital HA this morning on waking up. Received tramadol one dose and HA went away. Occipital nerve block cancelled. Will give tramadol PRN at home on discharge.    OBJECTIVE Temp:  [97.8 F (36.6 C)-98.8 F (37.1 C)] 98.7 F (37.1 C) (10/02 1413) Pulse Rate:  [63-87] 78 (10/02 1413) Cardiac Rhythm: Normal sinus rhythm (10/02 0700) Resp:  [16-20] 20 (10/02 1413) BP: (121-160)/(52-69) 123/52 (10/02 1413) SpO2:  [94 %-97 %] 96 % (10/02 1413)  CBC:   Recent Labs Lab 08/03/16 0250 08/04/16 0641  WBC 23.5* 24.4*  NEUTROABS 3.3 3.4  HGB 10.1* 10.6*  HCT 33.7* 34.8*  MCV 85.3 83.7  PLT 313 [id number]    Basic Metabolic Panel:   Recent Labs Lab 08/02/16 0823 08/03/16 0250 08/04/16 0641  NA 131* 137 134*  K 4.2 4.0 4.4  CL 97* 104 98*  CO2 22 25 24   GLUCOSE 307* 196* 200*  BUN 23* 21* 16  CREATININE 0.85 0.73 0.73  CALCIUM 8.7* 9.1 9.5  MG 2.0 2.2 2.1  PHOS 2.6 3.4  --     Lipid Panel:     Component Value Date/Time   CHOL 202 (H) 07/30/2016 0637   TRIG 259 (H) 07/30/2016 0637   HDL 28 (L) 07/30/2016 0637   CHOLHDL 7.2 07/30/2016 0637   VLDL 52 (H) 07/30/2016 0637   LDLCALC 122 (H) 07/30/2016 0637   HgbA1c:  Lab Results  Component Value Date   HGBA1C 8.3 (H) 07/30/2016   Urine Drug Screen:     Component Value Date/Time   LABOPIA NONE DETECTED 07/29/2016 1930   COCAINSCRNUR NONE DETECTED 07/29/2016 1930   LABBENZ NONE DETECTED 07/29/2016 1930   AMPHETMU NONE DETECTED 07/29/2016 1930   THCU NONE DETECTED 07/29/2016 1930   LABBARB NONE DETECTED 07/29/2016 1930      IMAGING I have personally reviewed the radiological images below and agree with the radiology interpretations.  Ct Head Wo Contrast 08/01/2016 1. 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 This is much larger than the  previously noted small punctate infarcts in this region. Mild associated mass effect, without significant midline shift.  2. Mild cortical volume loss noted.   MRI Brain W/O Cm  1. Large right ACA territory infarct has developed since 07/30/2016. No evidence of hemorrhagic transformation. No significant intracranial mass effect. 2. No other vascular territory ischemia identified.   PHYSICAL EXAM  Temp:  [97.8 F (36.6 C)-98.8 F (37.1 C)] 98.7 F (37.1 C) (10/02 1413) Pulse Rate:  [63-87] 78 (10/02 1413) Resp:  [16-20] 20 (10/02 1413) BP: (121-160)/(52-69) 123/52 (10/02 1413) SpO2:  [94 %-97 %] 96 % (10/02 1413)  General - Well nourished, well developed, in no apparent distress, mildly restless.  Ophthalmologic - Fundi not visualized due to noncooperation.  Cardiovascular - Regular rate and rhythm.  Mental Status -  Level of arousal and orientation to time, place, and person were intact. Language including expression, naming, repetition, comprehension was assessed and found intact. Attention span and concentration were impaired, able to calculate but wrong on backward spelling. Recent and remote memory were 3/3 registration and 2/3 delayed recall. Fund of Knowledge was assessed and was intact.  Cranial Nerves II - XII - II - Visual field intact OU. III, IV, VI - Extraocular movements intact. V - Facial sensation intact bilaterally. VII - Facial  movement intact bilaterally. VIII - Hearing & vestibular intact bilaterally. X - Palate elevates symmetrically. XI - Chin turning & shoulder shrug intact bilaterally. XII - Tongue protrusion intact.  Motor Strength - The patient's strength was normal in all extremities except left arm mild pronator drift and left hand dexterity difficulty (likely chronic as per pt) with left foot DF/PF 4+/5.  Bulk was normal and fasciculations were absent.   Motor Tone - Muscle tone was assessed at the neck and appendages and was normal.  Reflexes  - The patient's reflexes were 1+ in all extremities and she had no pathological reflexes.  Sensory - Light touch, temperature/pinprick were assessed and were symmetrical.    Coordination - The patient had normal movements in the hands with no ataxia or dysmetria.  Tremor was absent.  Gait and Station - deferred due to safety concerns.    ASSESSMENT/PLAN Ms. SHERREL REDER is a 75 y.o. female with history of a recent stroke, noncompliance with medications, hypertension, hyperlipidemia, diabetes mellitus, bronchitis, and depression presenting with confusion and left-sided weakness. She did not receive IV t-PA due to late presentation and recent stroke.  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 However, cardioembolic can not be ruled out at this time.  Resultant  Left mild hemiparesis  MRI - right ACA infarct  MRA - 07/30/2016 - occlusion RIGHT anterior cerebral artery at A2 segment. 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  CT head -  New evolving acute infarct at the medial distribution of the right ACA along the right frontal lobe.  Carotid Doppler - 07/30/2016 - unremarkable.  2D Echo - EF 60-65%. No cardiac source of emboli identified.  Recommend 30 day cardiac event monitoring as outpt to rule out afib.  LDL - 122  HgbA1c - 8.3  VTE prophylaxis - Lovenox Diet heart healthy/carb modified Room service appropriate? Yes; Fluid consistency: Thin Diet - low sodium heart healthy  clopidogrel 75 mg daily prior to admission, now on clopidogrel 75 mg daily. Continue plavix on discharge.  Patient counseled to be compliant with her antithrombotic medications  Ongoing aggressive stroke risk factor management  Therapy recommendations: Home Health PT and OT recommended.  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 Alfredia Ferguson.  Occipital neuralgia  Neck pain, back of head pain, tenderness on bilateral occipital nerve palpitation  Had sterids dose pack 1-2 weeks ago but not comes back again  Tramadol PRN on discharge.  Occipital nerve block cancelled today.   UTI  Repeat UA WBC TMTC  U Cx showed positive klebsiellar P.   On cipro which was sensitive per last urine culture  Switched to rocephin.   Hypertension  Stable  Permissive hypertension (OK if < 220/120) but gradually normalize in 5-7 days  Long-term BP goal normotensive  Check orthostatic vitals - no orthostatic hypotension  Daughter is advised to check BP at home closely.  Hyperlipidemia  Home meds:  Pravachol 80 mg daily resumed in hospital  LDL 122, goal < 70  Continue statin at discharge  Diabetes  HgbA1c 8.3, goal < 7.0  Uncontrolled  On lantus  Needs better control as outpt  Other Stroke Risk Factors  Advanced age  Former cigarette smoker - remote.  Obesity, Body mass index is 37.49  kg/m., recommend weight loss, diet and exercise as appropriate   Hx stroke/TIA  History of noncompliance with medications.  Other Active Problems  Anemia  Hyponatremia - 131 -> 137    Hospital day # 3  Neurology will sign off. Please call with questions. Pt will follow up with Cecille Rubin NP at [facility] in about 6 weeks. Thanks for the consult.   Rosalin Hawking, MD PhD Stroke Neurology 08/04/2016 3:37 PM    To contact Stroke Continuity provider, please refer to [email or web address] After hours, contact General Neurology

## 2016-08-05 DIAGNOSIS — T380X5D Adverse effect of glucocorticoids and synthetic analogues, subsequent encounter: Secondary | ICD-10-CM | POA: Diagnosis not present

## 2016-08-05 DIAGNOSIS — D649 Anemia, unspecified: Secondary | ICD-10-CM | POA: Diagnosis not present

## 2016-08-05 DIAGNOSIS — K219 Gastro-esophageal reflux disease without esophagitis: Secondary | ICD-10-CM | POA: Diagnosis not present

## 2016-08-05 DIAGNOSIS — E1165 Type 2 diabetes mellitus with hyperglycemia: Secondary | ICD-10-CM | POA: Diagnosis not present

## 2016-08-05 DIAGNOSIS — E785 Hyperlipidemia, unspecified: Secondary | ICD-10-CM | POA: Diagnosis not present

## 2016-08-05 DIAGNOSIS — D72829 Elevated white blood cell count, unspecified: Secondary | ICD-10-CM | POA: Diagnosis not present

## 2016-08-05 DIAGNOSIS — Z794 Long term (current) use of insulin: Secondary | ICD-10-CM | POA: Diagnosis not present

## 2016-08-05 DIAGNOSIS — I10 Essential (primary) hypertension: Secondary | ICD-10-CM | POA: Diagnosis not present

## 2016-08-05 DIAGNOSIS — I69354 Hemiplegia and hemiparesis following cerebral infarction affecting left non-dominant side: Secondary | ICD-10-CM | POA: Diagnosis not present

## 2016-08-05 DIAGNOSIS — N390 Urinary tract infection, site not specified: Secondary | ICD-10-CM | POA: Diagnosis not present

## 2016-08-05 LAB — PATHOLOGIST SMEAR REVIEW

## 2016-08-06 LAB — VAS US CAROTID
LCCADDIAS: -17 cm/s
LEFT ECA DIAS: -1 cm/s
LEFT VERTEBRAL DIAS: -6 cm/s
LICADDIAS: -17 cm/s
LICADSYS: -70 cm/s
LICAPSYS: -87 cm/s
Left CCA dist sys: -75 cm/s
Left CCA prox dias: -11 cm/s
Left CCA prox sys: -100 cm/s
Left ICA prox dias: -22 cm/s
RIGHT ECA DIAS: -6 cm/s
RIGHT VERTEBRAL DIAS: -7 cm/s
Right CCA prox dias: -22 cm/s
Right CCA prox sys: -117 cm/s
Right cca dist sys: -80 cm/s

## 2016-08-07 DIAGNOSIS — D72829 Elevated white blood cell count, unspecified: Secondary | ICD-10-CM | POA: Diagnosis not present

## 2016-08-07 DIAGNOSIS — T380X5D Adverse effect of glucocorticoids and synthetic analogues, subsequent encounter: Secondary | ICD-10-CM | POA: Diagnosis not present

## 2016-08-07 DIAGNOSIS — Z794 Long term (current) use of insulin: Secondary | ICD-10-CM | POA: Diagnosis not present

## 2016-08-07 DIAGNOSIS — N390 Urinary tract infection, site not specified: Secondary | ICD-10-CM | POA: Diagnosis not present

## 2016-08-07 DIAGNOSIS — I69959 Hemiplegia and hemiparesis following unspecified cerebrovascular disease affecting unspecified side: Secondary | ICD-10-CM | POA: Diagnosis not present

## 2016-08-07 DIAGNOSIS — E119 Type 2 diabetes mellitus without complications: Secondary | ICD-10-CM | POA: Diagnosis not present

## 2016-08-07 DIAGNOSIS — D649 Anemia, unspecified: Secondary | ICD-10-CM | POA: Diagnosis not present

## 2016-08-07 DIAGNOSIS — E1165 Type 2 diabetes mellitus with hyperglycemia: Secondary | ICD-10-CM | POA: Diagnosis not present

## 2016-08-07 DIAGNOSIS — K219 Gastro-esophageal reflux disease without esophagitis: Secondary | ICD-10-CM | POA: Diagnosis not present

## 2016-08-07 DIAGNOSIS — I69354 Hemiplegia and hemiparesis following cerebral infarction affecting left non-dominant side: Secondary | ICD-10-CM | POA: Diagnosis not present

## 2016-08-07 DIAGNOSIS — I10 Essential (primary) hypertension: Secondary | ICD-10-CM | POA: Diagnosis not present

## 2016-08-07 DIAGNOSIS — E785 Hyperlipidemia, unspecified: Secondary | ICD-10-CM | POA: Diagnosis not present

## 2016-08-08 DIAGNOSIS — I639 Cerebral infarction, unspecified: Secondary | ICD-10-CM | POA: Diagnosis not present

## 2016-08-11 ENCOUNTER — Telehealth: Payer: Self-pay | Admitting: Hematology

## 2016-08-11 ENCOUNTER — Other Ambulatory Visit: Payer: Self-pay

## 2016-08-11 DIAGNOSIS — E785 Hyperlipidemia, unspecified: Secondary | ICD-10-CM | POA: Diagnosis not present

## 2016-08-11 DIAGNOSIS — D649 Anemia, unspecified: Secondary | ICD-10-CM | POA: Diagnosis not present

## 2016-08-11 DIAGNOSIS — T380X5D Adverse effect of glucocorticoids and synthetic analogues, subsequent encounter: Secondary | ICD-10-CM | POA: Diagnosis not present

## 2016-08-11 DIAGNOSIS — N390 Urinary tract infection, site not specified: Secondary | ICD-10-CM | POA: Diagnosis not present

## 2016-08-11 DIAGNOSIS — I10 Essential (primary) hypertension: Secondary | ICD-10-CM | POA: Diagnosis not present

## 2016-08-11 DIAGNOSIS — Z794 Long term (current) use of insulin: Secondary | ICD-10-CM | POA: Diagnosis not present

## 2016-08-11 DIAGNOSIS — E1165 Type 2 diabetes mellitus with hyperglycemia: Secondary | ICD-10-CM | POA: Diagnosis not present

## 2016-08-11 DIAGNOSIS — D72829 Elevated white blood cell count, unspecified: Secondary | ICD-10-CM | POA: Diagnosis not present

## 2016-08-11 DIAGNOSIS — K219 Gastro-esophageal reflux disease without esophagitis: Secondary | ICD-10-CM | POA: Diagnosis not present

## 2016-08-11 DIAGNOSIS — I69354 Hemiplegia and hemiparesis following cerebral infarction affecting left non-dominant side: Secondary | ICD-10-CM | POA: Diagnosis not present

## 2016-08-11 NOTE — Patient Outreach (Signed)
[facility]) Care Management  08/11/2016  Theresa Kramer Jan 18, 1941 [id number]  REFERRAL DATE: 08/11/16 REFERRAL SOURCE: EMMI stroke transition program REFERRAL REASON: EMMI stroke red alert:  "Feeling worse overall" CONSENT: Self/ Patient gave verbal authorization to speak with her daughter, Theresa Kramer regarding all of her personal health information.   PROVIDER:  Dr. Asencion Noble: primary MD Dr. Vernetta Honey - hematology/ oncology Dr. Erlinda Hong - neurology  SOCIAL:  Patient lives with her daughter, Theresa Kramer Patient uses walker for ambulation Home health providing occupational/physical therapy - [facility] Patients daughter manages patients medications and provides transportation  SUBJECTIVE:  Telephone call to patient regarding EMMI stroke red alert.  HIPAA verified with patient. Patient gave verbal authorization to speak with daughter,  Theresa Kramer regarding all of her health information.  Discussed EMMI stroke follow up program with patient and daughter.   Daughter states patient has had a follow up visit with her primary MD since discharge from the hospital on August 07, 2016.  Daughter states she discussed with the doctor that patient has had quite a few blood sugar drops.  States doctor adjusted patients lantas to 65 units at night and humulog down to 12 units.  Daughter states she manages patients medications and makes sure that patients blood sugar and blood pressure is checked daily.  States doctor discontinued patients aspirin and started patient on plavix, and doubled patients cholesterol medication.  Daughter states patient's appetite has gotten better.  Daughter reports that [facility] is providing physical and occupational therapy to patient.  Daughter states patient has been doing much better over the last several days.  States patient is now walking with her walker to the mailbox.  Seems to be getting stronger. Daughter states patient has fallen  within the past week.  Daughter states patient has been getting stronger since then. Daughter states that patient is never left alone and that patient had been sleeping in a recliner in her room. Daughter states she has purchased a bed alarm and railing for the bed.  Daughter states patient uses walker and has wheelchair to assist with ambulation.  Daughter states patient has follow up appointments scheduled with neurologist and hematology/ oncology. Daughter states patient is currently in the donut hole with her medication.  Verbally agreed to referral to [facility] pharmacist for medication assistance.  RNCM discussed with patients daughter signs/ symptoms of stroke, call 911 for stroke like symptoms. Contact primary MD for any other unusual signs/ symptoms RNCM discussed with daughter that patient should take her medication as prescribed. Keep follow up doctor appointments.  Daughter states she feels like she is managing patient well and does not require additional assistance.  Daughter verbally agreed to follow up call with RNCM to   ASSESSEMENT:   75 y.o.femalewith a past medical history significant for HTN, IDDM. 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  RNCM will refer patient to [facility] care management pharmacist  Quinn Plowman RN,BSN,CCM [facility] Telephonic  [phone]

## 2016-08-11 NOTE — Telephone Encounter (Signed)
Lft vm to reschedule appt w/Kale.

## 2016-08-12 ENCOUNTER — Telehealth: Payer: Self-pay | Admitting: Hematology

## 2016-08-12 DIAGNOSIS — E785 Hyperlipidemia, unspecified: Secondary | ICD-10-CM | POA: Diagnosis not present

## 2016-08-12 DIAGNOSIS — N390 Urinary tract infection, site not specified: Secondary | ICD-10-CM | POA: Diagnosis not present

## 2016-08-12 DIAGNOSIS — I10 Essential (primary) hypertension: Secondary | ICD-10-CM | POA: Diagnosis not present

## 2016-08-12 DIAGNOSIS — T380X5D Adverse effect of glucocorticoids and synthetic analogues, subsequent encounter: Secondary | ICD-10-CM | POA: Diagnosis not present

## 2016-08-12 DIAGNOSIS — Z794 Long term (current) use of insulin: Secondary | ICD-10-CM | POA: Diagnosis not present

## 2016-08-12 DIAGNOSIS — K219 Gastro-esophageal reflux disease without esophagitis: Secondary | ICD-10-CM | POA: Diagnosis not present

## 2016-08-12 DIAGNOSIS — E1165 Type 2 diabetes mellitus with hyperglycemia: Secondary | ICD-10-CM | POA: Diagnosis not present

## 2016-08-12 DIAGNOSIS — D649 Anemia, unspecified: Secondary | ICD-10-CM | POA: Diagnosis not present

## 2016-08-12 DIAGNOSIS — I69354 Hemiplegia and hemiparesis following cerebral infarction affecting left non-dominant side: Secondary | ICD-10-CM | POA: Diagnosis not present

## 2016-08-12 DIAGNOSIS — D72829 Elevated white blood cell count, unspecified: Secondary | ICD-10-CM | POA: Diagnosis not present

## 2016-08-12 NOTE — Telephone Encounter (Signed)
Cld the patient's daughter [name] to reschedule her mother's appt w/[facility]. Scheduled for 10/16 @830am . Aware to have her mom here by 815am.

## 2016-08-13 DIAGNOSIS — D649 Anemia, unspecified: Secondary | ICD-10-CM | POA: Diagnosis not present

## 2016-08-13 DIAGNOSIS — N390 Urinary tract infection, site not specified: Secondary | ICD-10-CM | POA: Diagnosis not present

## 2016-08-13 DIAGNOSIS — K219 Gastro-esophageal reflux disease without esophagitis: Secondary | ICD-10-CM | POA: Diagnosis not present

## 2016-08-13 DIAGNOSIS — E1165 Type 2 diabetes mellitus with hyperglycemia: Secondary | ICD-10-CM | POA: Diagnosis not present

## 2016-08-13 DIAGNOSIS — I10 Essential (primary) hypertension: Secondary | ICD-10-CM | POA: Diagnosis not present

## 2016-08-13 DIAGNOSIS — I69354 Hemiplegia and hemiparesis following cerebral infarction affecting left non-dominant side: Secondary | ICD-10-CM | POA: Diagnosis not present

## 2016-08-13 DIAGNOSIS — T380X5D Adverse effect of glucocorticoids and synthetic analogues, subsequent encounter: Secondary | ICD-10-CM | POA: Diagnosis not present

## 2016-08-13 DIAGNOSIS — Z794 Long term (current) use of insulin: Secondary | ICD-10-CM | POA: Diagnosis not present

## 2016-08-13 DIAGNOSIS — E785 Hyperlipidemia, unspecified: Secondary | ICD-10-CM | POA: Diagnosis not present

## 2016-08-13 DIAGNOSIS — D72829 Elevated white blood cell count, unspecified: Secondary | ICD-10-CM | POA: Diagnosis not present

## 2016-08-14 DIAGNOSIS — I69354 Hemiplegia and hemiparesis following cerebral infarction affecting left non-dominant side: Secondary | ICD-10-CM | POA: Diagnosis not present

## 2016-08-14 DIAGNOSIS — D72829 Elevated white blood cell count, unspecified: Secondary | ICD-10-CM | POA: Diagnosis not present

## 2016-08-14 DIAGNOSIS — E785 Hyperlipidemia, unspecified: Secondary | ICD-10-CM | POA: Diagnosis not present

## 2016-08-14 DIAGNOSIS — N390 Urinary tract infection, site not specified: Secondary | ICD-10-CM | POA: Diagnosis not present

## 2016-08-14 DIAGNOSIS — D649 Anemia, unspecified: Secondary | ICD-10-CM | POA: Diagnosis not present

## 2016-08-14 DIAGNOSIS — K219 Gastro-esophageal reflux disease without esophagitis: Secondary | ICD-10-CM | POA: Diagnosis not present

## 2016-08-14 DIAGNOSIS — Z794 Long term (current) use of insulin: Secondary | ICD-10-CM | POA: Diagnosis not present

## 2016-08-14 DIAGNOSIS — I10 Essential (primary) hypertension: Secondary | ICD-10-CM | POA: Diagnosis not present

## 2016-08-14 DIAGNOSIS — E1165 Type 2 diabetes mellitus with hyperglycemia: Secondary | ICD-10-CM | POA: Diagnosis not present

## 2016-08-14 DIAGNOSIS — T380X5D Adverse effect of glucocorticoids and synthetic analogues, subsequent encounter: Secondary | ICD-10-CM | POA: Diagnosis not present

## 2016-08-18 ENCOUNTER — Ambulatory Visit (HOSPITAL_BASED_OUTPATIENT_CLINIC_OR_DEPARTMENT_OTHER): Payer: Medicare Other | Admitting: Hematology

## 2016-08-18 ENCOUNTER — Encounter: Payer: Self-pay | Admitting: Hematology

## 2016-08-18 VITALS — BP 153/58 | HR 89 | Temp 98.5°F | Resp 18 | Ht 64.0 in | Wt 220.3 lb

## 2016-08-18 DIAGNOSIS — D479 Neoplasm of uncertain behavior of lymphoid, hematopoietic and related tissue, unspecified: Secondary | ICD-10-CM

## 2016-08-18 DIAGNOSIS — D649 Anemia, unspecified: Secondary | ICD-10-CM | POA: Diagnosis not present

## 2016-08-18 NOTE — Progress Notes (Signed)
Marland Kitchen    HEMATOLOGY/ONCOLOGY CONSULTATION NOTE  Date of Service: 08/18/2016  Patient Care Team: Asencion Noble, MD as PCP - T[place], RN as [facility]  CHIEF COMPLAINTS/PURPOSE OF CONSULTATION:  Monoclonal B- lymphoproliferative disorder   HISTORY OF PRESENTING ILLNESS:  Theresa Kramer is a wonderful 75 y.o. female who has been referred to [place] by Dr Asencion Noble, MD for evaluation and management of lymphocytosis.  Patient has a h/o B12 def, depression, arthritis and DDD and was recently admitted to the hospital with TIA vs delirium in the setting of UTI. 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 She is here for further evaluation of her newly diagnosed B-cell leukemia/lymphoma. Patient reports no fevers. Has some night sweats but those have been in the setting of hypoglycemia. No overt LNadenopathy. No abdominal pain or distension. No reported weight loss. Has chronic fatigue that hasnt significantly changed recently.   MEDICAL HISTORY:  Past Medical History:  Diagnosis Date  . Arthritis   . B12 deficiency   . Bronchitis    hx of  . Complication of anesthesia   . Depression    "not taking medication"  . Diabetes mellitus   . GERD (gastroesophageal reflux disease)   . H/O hiatal hernia   . Hyperlipidemia   . Hypertension   . Noncompliance with medication regimen   . Pinched cervical nerve root   . PONV (postoperative nausea and vomiting)   . Urinary tract infection    hx of    SURGICAL HISTORY: Past Surgical History:  Procedure Laterality Date  . ABDOMINAL HYSTERECTOMY    . ANTERIOR CERVICAL DECOMP/DISCECTOMY FUSION  03/15/2012   Procedure: ANTERIOR CERVICAL DECOMPRESSION/DISCECTOMY FUSION 2 LEVELS;  Surgeon: Ophelia Charter, MD;  Location: [facility];  Service: Neurosurgery;  Laterality: N/A;  Cervical four-five Cervical  five six anterior cervical decompression with fusion interbody prothesis plating and bonegraft  . APPENDECTOMY    . BONE CYST EXCISION     from lower back and foot  . CHOLECYSTECTOMY    . COLONOSCOPY  2011   Dr. Gala Romney: prep compromised exam. diverticulosis, hemorrhoids. next tcs 2021  . DILATION AND CURETTAGE OF UTERUS    . 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 Multiple distal rings dilated and disrupted as described above. Small hiatal hernia.   Marland Kitchen EXCISION METACARPAL MASS Left 03/03/2016   Procedure: EXCISION OF LEFT  DORSAL MASS OF EXTENSOR TENDON;  Surgeon: Milly Jakob, MD;  Location: [facility];  Service: Orthopedics;  Laterality: Left;  . EYE SURGERY     bilateral cataract surgery,   . HIP ARTHROPLASTY     right  . JOINT REPLACEMENT    . KNEE SURGERY     torn cartilage repair  . MALONEY DILATION N/A 09/19/2014   Procedure: Venia Minks DILATION;  Surgeon: Daneil Dolin, MD;  [facility];  Service: Endoscopy;  Laterality: N/A;  . ORTHOPEDIC SURGERY    . ROTATOR CUFF REPAIR     "multiple in both shoulders" and total shoulder replacement in left arm  . SAVORY DILATION N/A 09/19/2014   Procedure: SAVORY DILATION;  Surgeon: Daneil Dolin, MD;  [facility];  Service: Endoscopy;  Laterality: N/A;  . TONSILLECTOMY      SOCIAL HISTORY: Social History   Social History  . Marital status: Widowed  Spouse name: N/A  . Number of children: N/A  . Years of education: N/A   Occupational History  . Not on file.   Social History Main Topics  . Smoking status: Former Research scientist (life sciences)  . Smokeless tobacco: Never Used     Comment: stopped 25 years ago  . Alcohol use No  . Drug use: No  . Sexual activity: Not on file   Other Topics Concern  . Not on file   Social History Narrative  . No narrative on file    FAMILY HISTORY: Family History  Problem Relation Age of Onset  .  Cirrhosis Brother     etoh  . Colon cancer Neg Hx     ALLERGIES:  is allergic to meloxicam.  MEDICATIONS:  Current Outpatient Prescriptions  Medication Sig Dispense Refill  . acetaminophen (TYLENOL) 650 MG CR tablet Take 1,300 mg by mouth 2 (two) times daily.    Marland Kitchen amLODipine (NORVASC) 5 MG tablet Take 5 mg by mouth daily.    . benzonatate (TESSALON) 200 MG capsule Take 200 mg by mouth 3 (three) times daily as needed for cough.    . citalopram (CELEXA) 20 MG tablet Take 20 mg by mouth daily.      . clopidogrel (PLAVIX) 75 MG tablet Take 1 tablet (75 mg total) by mouth daily. 30 tablet 1  . clotrimazole (LOTRIMIN) 1 % cream Apply 1 application topically 2 (two) times daily as needed. For rash     . cyanocobalamin (,VITAMIN B-12,) 1000 MCG/ML injection Inject 1,000 mcg into the muscle every 30 (thirty) days. Usually on the 1st     . gemfibrozil (LOPID) 600 MG tablet Take 600 mg by mouth 2 (two) times daily before a meal.      . glipiZIDE (GLUCOTROL XL) 5 MG 24 hr tablet Take 5 mg by mouth daily.      Marland Kitchen HYDROcodone-homatropine (HYCODAN) 5-1.5 MG/5ML syrup Take 5 mLs by mouth every 4 (four) hours as needed for cough.    . insulin aspart (NOVOLOG FLEXPEN) 100 UNIT/ML injection Inject 12 Units into the skin 3 (three) times daily before meals.     . insulin glargine (LANTUS) 100 UNIT/ML injection Inject 70 Units into the skin at bedtime.     Marland Kitchen losartan-hydrochlorothiazide (HYZAAR) 100-12.5 MG tablet Take 1 tablet by mouth daily.    . metFORMIN (GLUCOPHAGE) 1000 MG tablet Take 1,000 mg by mouth 2 (two) times daily with a meal.      . oxybutynin (DITROPAN-XL) 10 MG 24 hr tablet Take 10 mg by mouth daily.     . pantoprazole (PROTONIX) 40 MG tablet Take 40 mg by mouth daily.    . pramipexole (MIRAPEX) 0.25 MG tablet Take 0.25 mg by mouth daily.      . pravastatin (PRAVACHOL) 80 MG tablet Take 1 tablet (80 mg total) by mouth daily. 30 tablet 3  . ranitidine (ZANTAC) 150 MG tablet Take 150 mg by mouth  at bedtime.    . traMADol (ULTRAM) 50 MG tablet Take 1 tablet (50 mg total) by mouth every 6 (six) hours as needed for moderate pain. 20 tablet 0   No current facility-administered medications for this visit.     REVIEW OF SYSTEMS:    10 Point review of Systems was done is negative except as noted above.  PHYSICAL EXAMINATION: ECOG PERFORMANCE STATUS: 2 - Symptomatic, <50% confined to bed  . Vitals:   08/18/16 0826  BP: (!) 153/58  Pulse: 89  Resp: 18  Temp: 98.5 F (36.9 C)   Filed Weights   08/18/16 0826  Weight: 220 lb 4.8 oz (99.9 kg)   .Body mass index is 37.81 kg/m.  GENERAL:alert, in no acute distress and comfortable SKIN: skin color, texture, turgor are normal, no rashes or significant lesions EYES: normal, conjunctiva are pink and non-injected, sclera clear OROPHARYNX:no exudate, no erythema and lips, buccal mucosa, and tongue normal  NECK: supple, no JVD, thyroid normal size, non-tender, without nodularity LYMPH:  no palpable lymphadenopathy in the cervical, axillary or inguinal LUNGS: clear to auscultation with normal respiratory effort HEART: regular rate & rhythm,  no murmurs and no lower extremity edema ABDOMEN: abdomen soft, non-tender, normoactive bowel sounds , no palpable hepatosplenomegaly. Musculoskeletal: no cyanosis of digits and no clubbing  PSYCH: alert & oriented x 3 with fluent speech NEURO: no focal motor/sensory deficits  LABORATORY DATA:  I have reviewed the data as listed  . CBC Latest Ref Rng & Units 08/04/2016 08/03/2016 08/02/2016  WBC 4.0 - 10.5 K/uL 24.4(H) 23.5(H) 25.4(H)  Hemoglobin 12.0 - 15.0 g/dL 10.6(L) 10.1(L) 10.4(L)  Hematocrit 36.0 - 46.0 % 34.8(L) 33.7(L) 33.7(L)  Platelets 150 - 400 K/uL 321 313 354   . CBC    Component Value Date/Time   WBC 24.4 (H) 08/04/2016 0641   RBC 4.16 08/04/2016 0641   HGB 10.6 (L) 08/04/2016 0641   HCT 34.8 (L) 08/04/2016 0641   PLT 321 08/04/2016 0641   MCV 83.7 08/04/2016 0641   MCH  25.5 (L) 08/04/2016 0641   MCHC 30.5 08/04/2016 0641   RDW 15.2 08/04/2016 0641   LYMPHSABS 19.1 (H) 08/04/2016 0641   MONOABS 1.2 (H) 08/04/2016 0641   EOSABS 0.7 08/04/2016 0641   BASOSABS 0.0 08/04/2016 0641     . CMP Latest Ref Rng & Units 08/04/2016 08/03/2016 08/02/2016  Glucose 65 - 99 mg/dL 200(H) 196(H) 307(H)  BUN 6 - 20 mg/dL 16 21(H) 23(H)  Creatinine 0.44 - 1.00 mg/dL 0.73 0.73 0.85  Sodium 135 - 145 mmol/L 134(L) 137 131(L)  Potassium 3.5 - 5.1 mmol/L 4.4 4.0 4.2  Chloride 101 - 111 mmol/L 98(L) 104 97(L)  CO2 22 - 32 mmol/L _0 Calcium 8.9 - 10.3 mg/dL 9.5 9.1 8.7(L)  Total Protein 6.5 - 8.1 g/dL 7.4 7.0 6.9  Total Bilirubin 0.3 - 1.2 mg/dL 0.4 0.5 0.5  Alkaline Phos 38 - 126 U/L 84 66 69  AST 15 - 41 U/L 38 33 32  ALT 14 - 54 U/L 43 38 33      RADIOGRAPHIC STUDIES: I have personally reviewed the radiological images as listed and agreed with the findings in the report. Dg Chest 2 View  Result Date: 08/02/2016 CLINICAL DATA:  75 year old female with history of diabetes and hypertension. EXAM: CHEST  2 VIEW COMPARISON:  Chest x-ray 11/10/2015. FINDINGS: Lung volumes are normal. No consolidative airspace disease. No pleural effusions. No pneumothorax. No pulmonary nodule or mass noted. Pulmonary vasculature and the cardiomediastinal silhouette are within normal limits. Orthopedic fixation hardware in the lower cervical spine. Postoperative changes of left shoulder arthroplasty incompletely visualized. IMPRESSION: 1. No radiographic evidence of acute cardiopulmonary disease. Electronically Signed   By: Vinnie Langton M.D.   On: 08/02/2016 12:00   Ct Head Wo Contrast  Result Date: 08/01/2016 CLINICAL DATA:  Subacute onset of left-sided weakness. Initial encounter. EXAM: CT HEAD WITHOUT CONTRAST TECHNIQUE: Contiguous axial images were obtained from the base of the skull through the vertex without intravenous contrast. COMPARISON:  MRI of the brain performed  07/30/2016, and CT of the head performed 07/29/2016 FINDINGS: Brain: There is a new evolving acute infarct at the medial distribution of the right anterior cerebral artery, along the right frontal lobe. This is much larger than the previously noted small punctate infarcts in this region. Mild associated mass effect is noted, without evidence of significant midline shift. Prominence of the ventricles and sulci reflects mild cortical volume loss. There is no evidence of hemorrhagic transformation, hydrocephalus or mass lesion. The brainstem and fourth ventricle are within normal limits. The basal ganglia are unremarkable in appearance. Vascular: No hyperdense vessel or unexpected calcification. Skull: There is no evidence of fracture; visualized osseous structures are unremarkable in appearance. Sinuses/Orbits: The orbits are within normal limits. The paranasal sinuses and mastoid air cells are well-aerated. Other: No significant soft tissue abnormalities are seen. IMPRESSION: 1. 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 This is much larger than the previously noted small punctate infarcts in this region. Mild associated mass effect, without significant midline shift. 2. Mild cortical volume loss noted. These results were called by telephone at the time of interpretation on 08/01/2016 at 9:32 pm to [facility], who verbally acknowledged these results. Electronically Signed   By: Garald Balding M.D.   On: 08/01/2016 21:33   Ct Head Wo Contrast  Result Date: 07/29/2016 CLINICAL DATA:  75 y/o F; 30 minutes of altered mental status today. EXAM: CT HEAD WITHOUT CONTRAST TECHNIQUE: Contiguous axial images were obtained from the base of the skull through the vertex without intravenous contrast. COMPARISON:  05/03/2011 CT head. FINDINGS: Brain: No evidence of acute infarction, hemorrhage, hydrocephalus, extra-axial collection or mass  lesion/mass effect. Chronic microvascular ischemic changes and parenchymal volume loss are mildly progressed from 2012. Vascular: No hyperdense vessel. Calcific atherosclerosis of cavernous internal carotid artery is and vertebral arteries. Skull: Normal. Negative for fracture or focal lesion. Sinuses/Orbits: No acute finding. Other: None. IMPRESSION: No acute intracranial abnormality is identified. Mild chronic microvascular ischemic changes and parenchymal volume loss are progressed from 2012. Electronically Signed   By: Kristine Garbe M.D.   On: 07/29/2016 18:34   Mr Brain Wo Contrast  Result Date: 07/30/2016 CLINICAL DATA:  30 minute episode altered mental status, concurrent hyperglycemia secondary to steroid use for inflamed ganglion cyst. History of hypertension, diabetes. LEFT transient ischemic attack. EXAM: MRI HEAD WITHOUT CONTRAST MRA HEAD WITHOUT CONTRAST TECHNIQUE: Multiplanar, multiecho pulse sequences of the brain and surrounding structures were obtained without intravenous contrast. Angiographic images of the head were obtained using MRA technique without contrast. COMPARISON:  CT HEAD July 29, 2016 FINDINGS: MRI HEAD FINDINGS BRAIN: Punctate foci of reduced diffusion RIGHT frontal lobe/ anterior genu of the corpus callosum and RIGHT centrum semiovale. No susceptibility artifact to suggest hemorrhage. Patchy supratentorial pontine white matter FLAIR T2 hyperintensities compatible with mild chronic small vessel ischemic disease, less than expected for age. The ventricles and sulci are normal for patient's age. No suspicious parenchymal signal, masses or mass effect. No abnormal extra-axial fluid collections. No extra-axial masses though, contrast enhanced sequences would be more sensitive. VASCULAR: Normal major intracranial vascular flow voids present at skull base. SKULL AND UPPER CERVICAL SPINE: No abnormal sellar expansion. No suspicious calvarial bone marrow signal.  Craniocervical junction maintained. SINUSES/ORBITS: The mastoid air-cells and included paranasal sinuses are well-aerated. Status post bilateral ocular lens implants. The included ocular globes and orbital contents are non-suspicious. OTHER: Patient is  edentulous. MRA HEAD FINDINGS- mildly motion degraded examination. ANTERIOR CIRCULATION: Normal flow related enhancement of the included cervical, petrous, cavernous and supraclinoid internal carotid arteries. 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 Patent anterior communicating artery. Occluded proximal RIGHT A2 segment. Moderate tandem stenosis bilateral mid to distal anterior middle cerebral arteries. No high-grade stenosis, abnormal luminal irregularity. POSTERIOR CIRCULATION: Codominant vertebral artery's. Basilar artery is patent, with normal flow related enhancement of the main branch vessels. Normal flow related enhancement of the posterior cerebral arteries. Mild stenosis proximal LEFT P2 segment. Moderate stenosis proximal LEFT P3 segment . No large vessel occlusion, high-grade stenosis, abnormal luminal irregularity, aneurysm. IMPRESSION: MRI HEAD: 2 punctate acute infarcts RIGHT frontal lobe. Otherwise negative MRI of the head for age. MRA HEAD: Probably acute occlusion RIGHT anterior cerebral artery at A2 segment. Moderate intracranial atherosclerosis. 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 These results will be called to the ordering clinician or representative by the Radiologist Assistant, and communication documented in the PACS or zVision Dashboard. Electronically Signed   By: Elon Alas M.D.   On: 07/30/2016 06:08   Mr Brain Ltd W/o Cm  Result Date: 08/02/2016 CLINICAL DATA:  75 year old female with suspected punctate lacunar infarcts in the right hemisphere on recent MRI done for altered mental status. Neurology request for repeat diffusion-weighted imaging only. Recent  hyperglycemia. EXAM: MRI HEAD WITHOUT CONTRAST LIMITED TECHNIQUE: Multiplanar, multiecho pulse sequences of the brain and surrounding structures were obtained without intravenous contrast. COMPARISON:  Brain MRI and intracranial MRA 07/30/2016. FINDINGS: Axial and coronal diffusion weighted imaging was performed. Axial susceptibility weighted imaging was also performed. There is now all confluent restricted diffusion throughout the right ACA territory extending over an area of 6-7 cm of the right corpus callosum, cingulate gyrus, and medial aspect of the right superior frontal gyrus. No restricted diffusion elsewhere. No hemorrhagic transformation on susceptibility weighted imaging. No significant intracranial mass effect. IMPRESSION: 1. Large right ACA territory infarct has developed since 07/30/2016. No evidence of hemorrhagic transformation. No significant intracranial mass effect. 2. No other vascular territory ischemia identified. Electronically Signed   By: Genevie Ann M.D.   On: 08/02/2016 11:59   Mr Jodene Nam Head/brain AY Cm  Result Date: 07/30/2016 CLINICAL DATA:  30 minute episode altered mental status, concurrent hyperglycemia secondary to steroid use for inflamed ganglion cyst. History of hypertension, diabetes. LEFT transient ischemic attack. EXAM: MRI HEAD WITHOUT CONTRAST MRA HEAD WITHOUT CONTRAST TECHNIQUE: Multiplanar, multiecho pulse sequences of the brain and surrounding structures were obtained without intravenous contrast. Angiographic images of the head were obtained using MRA technique without contrast. COMPARISON:  CT HEAD July 29, 2016 FINDINGS: MRI HEAD FINDINGS BRAIN: Punctate foci of reduced diffusion RIGHT frontal lobe/ anterior genu of the corpus callosum and RIGHT centrum semiovale. No susceptibility artifact to suggest hemorrhage. Patchy supratentorial pontine white matter FLAIR T2 hyperintensities compatible with mild chronic small vessel ischemic disease, less than expected for age.  The ventricles and sulci are normal for patient's age. No suspicious parenchymal signal, masses or mass effect. No abnormal extra-axial fluid collections. No extra-axial masses though, contrast enhanced sequences would be more sensitive. VASCULAR: Normal major intracranial vascular flow voids present at skull base. SKULL AND UPPER CERVICAL SPINE: No abnormal sellar expansion. No suspicious calvarial bone marrow signal. Craniocervical junction maintained. SINUSES/ORBITS: The mastoid air-cells and included paranasal sinuses are well-aerated. Status post bilateral ocular lens implants. The included ocular globes and orbital contents are non-suspicious. OTHER: Patient is edentulous. MRA  HEAD FINDINGS- mildly motion degraded examination. ANTERIOR CIRCULATION: Normal flow related enhancement of the included cervical, petrous, cavernous and supraclinoid internal carotid arteries. 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 Patent anterior communicating artery. Occluded proximal RIGHT A2 segment. Moderate tandem stenosis bilateral mid to distal anterior middle cerebral arteries. No high-grade stenosis, abnormal luminal irregularity. POSTERIOR CIRCULATION: Codominant vertebral artery's. Basilar artery is patent, with normal flow related enhancement of the main branch vessels. Normal flow related enhancement of the posterior cerebral arteries. Mild stenosis proximal LEFT P2 segment. Moderate stenosis proximal LEFT P3 segment . No large vessel occlusion, high-grade stenosis, abnormal luminal irregularity, aneurysm. IMPRESSION: MRI HEAD: 2 punctate acute infarcts RIGHT frontal lobe. Otherwise negative MRI of the head for age. MRA HEAD: Probably acute occlusion RIGHT anterior cerebral artery at A2 segment. Moderate intracranial atherosclerosis. 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 These results will be called to the ordering clinician or representative by the  Radiologist Assistant, and communication documented in the PACS or zVision Dashboard. Electronically Signed   By: Elon Alas M.D.   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 Noted to have elevated wbc/lymphocyte counts end of September. Unknown if she had a more chronic elevation of her WBC counts that might put a timeline on her lymphocytosis.  Differential diagnosis- CD5 neg variant of CLL (about 17-20% patients with CLL could be CD5neg) vs B-PLL vs other B-cell leukemia/lymphoma  2) Normocytic Anemia with nl RDW. ? Related to lymphoproliferative process.  3) h/o B12 deficiency PLAN -findings on flow cytometry and their potential significance was discussed in details with the patient and her daughter and all their questions were answered. -we will need to get her previous labs from Dr Ria Comment office to determine the chronicity of her Lymphocytosis to put the disease in perspective. -rpt labs today with cbc w diff, cmp, ldh -myeloma panel, K/L FLC -peripheral blood/CLL FISH to see if cytogenetics might help indicate the diagnosis -PET/CT to check for lymphadenopathy/Hepato-splenomegaly for treatment planning. -if above workup non diagnostic might need Bone Marrow Examination  4). Patient Active Problem List   Diagnosis Date Noted  . Occipital neuralgia   . Acute ischemic stroke ([place])   . Stroke ([place])   . 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 Leukocytosis 08/01/2016  . Urinary tract infectious disease 08/01/2016  . Type 2 diabetes mellitus without complication, with long-term current use of insulin ([place]) 07/30/2016  . Essential hypertension 07/30/2016  . Osteoarthritis 07/30/2016  . Normocytic anemia 07/30/2016  . HLD (hyperlipidemia)   . Schatzki's ring   . Cervical herniated disc 03/15/2012  . GERD  04/29/2010  . CHEST PAIN, ATYPICAL 04/29/2010  . DYSPHAGIA UNSPECIFIED 04/29/2010  -continue rf/u with PCP.   RTC with Dr Irene Limbo in 2 weeks to review above testing results and determine further plan of care.  All of the patients questions were answered with apparent satisfaction. The patient knows to call the clinic with any problems, questions or concerns.  . Orders Placed This Encounter  Procedures  . NM PET Image Initial (PI) Skull Base To Thigh    Standing Status:   Future    Standing Expiration Date:   09/22/2017    Order Specific Question:   Reason for exam:    Answer:   Monoclonal B-cell leukemia/lymphoma newly diagnosed for  initial evaluation for treatment strategy planning    Order Specific Question:   Preferred imaging location?    Answer:   [facility]  . CBC & Diff and Retic    Standing Status:   Future    Standing Expiration Date:   08/18/2017  . Comprehensive metabolic panel    Standing Status:   Future    Standing Expiration Date:   08/18/2017  . Lactate dehydrogenase    Standing Status:   Future    Standing Expiration Date:   08/18/2017  . Sedimentation rate    Standing Status:   Future    Standing Expiration Date:   08/18/2017  . C-reactive protein    Standing Status:   Future    Standing Expiration Date:   08/18/2017  . Hepatitis C antibody    Standing Status:   Future    Standing Expiration Date:   08/18/2017  . HIV antibody (with reflex)    Standing Status:   Future    Standing Expiration Date:   08/18/2017  . FISH, Peripheral Blood    Peripheral blood Monoclonal B-cell leukemia ?atypical CLL vs other    Standing Status:   Future    Standing Expiration Date:   08/18/2017  . FISH, CLL Prognostic Panel    Standing Status:   Future    Standing Expiration Date:   08/18/2017  . Multiple Myeloma Panel (SPEP&IFE w/QIG)    Standing Status:   Future    Standing Expiration Date:   08/18/2017  . Kappa/lambda light chains    Standing Status:   Future     Standing Expiration Date:   09/22/2017  . Smear    Standing Status:   Future    Standing Expiration Date:   08/18/2017    I spent 45 minutes counseling the patient face to face. The total time spent in the appointment was 60 minutes and more than 50% was on counseling and direct patient cares.    Sullivan Lone MD [place] AAHIVMS [facility] Hematology/Oncology Physician [facility]  (Office):       [phone] (Work cell):  [phone] (Fax):           [phone]  08/18/2016 9:02 AM

## 2016-08-18 NOTE — Addendum Note (Signed)
Addended by: Kellie Simmering A on: 08/18/2016 10:17 AM   Modules accepted: Orders

## 2016-08-19 ENCOUNTER — Other Ambulatory Visit: Payer: Self-pay

## 2016-08-19 DIAGNOSIS — I69354 Hemiplegia and hemiparesis following cerebral infarction affecting left non-dominant side: Secondary | ICD-10-CM | POA: Diagnosis not present

## 2016-08-19 DIAGNOSIS — E785 Hyperlipidemia, unspecified: Secondary | ICD-10-CM | POA: Diagnosis not present

## 2016-08-19 DIAGNOSIS — D649 Anemia, unspecified: Secondary | ICD-10-CM | POA: Diagnosis not present

## 2016-08-19 DIAGNOSIS — Z794 Long term (current) use of insulin: Secondary | ICD-10-CM | POA: Diagnosis not present

## 2016-08-19 DIAGNOSIS — D72829 Elevated white blood cell count, unspecified: Secondary | ICD-10-CM | POA: Diagnosis not present

## 2016-08-19 DIAGNOSIS — I10 Essential (primary) hypertension: Secondary | ICD-10-CM | POA: Diagnosis not present

## 2016-08-19 DIAGNOSIS — N390 Urinary tract infection, site not specified: Secondary | ICD-10-CM | POA: Diagnosis not present

## 2016-08-19 DIAGNOSIS — T380X5D Adverse effect of glucocorticoids and synthetic analogues, subsequent encounter: Secondary | ICD-10-CM | POA: Diagnosis not present

## 2016-08-19 DIAGNOSIS — E1165 Type 2 diabetes mellitus with hyperglycemia: Secondary | ICD-10-CM | POA: Diagnosis not present

## 2016-08-19 DIAGNOSIS — K219 Gastro-esophageal reflux disease without esophagitis: Secondary | ICD-10-CM | POA: Diagnosis not present

## 2016-08-19 NOTE — Patient Outreach (Signed)
[place] [facility]) Care Management  08/19/2016  Theresa Kramer [birth year] [id number]  REFERRAL DATE: 08/11/16  REFERRAL SOURCE: EMMI stroke transition program REFERRAL REASON:  Follow up outreach  Telephone call to patient regarding EMMI stroke program follow up.  Unable to reach patient. HIPAA compliant voice message left with call back phone number.   PLAN; RNCM will attempt 2nd telephone outreach to patient within  1 week.   Quinn Plowman RN,BSN,CCM [facility] Telephonic  [phone]

## 2016-08-21 ENCOUNTER — Other Ambulatory Visit: Payer: Self-pay

## 2016-08-21 DIAGNOSIS — I69354 Hemiplegia and hemiparesis following cerebral infarction affecting left non-dominant side: Secondary | ICD-10-CM | POA: Diagnosis not present

## 2016-08-21 DIAGNOSIS — T380X5D Adverse effect of glucocorticoids and synthetic analogues, subsequent encounter: Secondary | ICD-10-CM | POA: Diagnosis not present

## 2016-08-21 DIAGNOSIS — I10 Essential (primary) hypertension: Secondary | ICD-10-CM | POA: Diagnosis not present

## 2016-08-21 DIAGNOSIS — E785 Hyperlipidemia, unspecified: Secondary | ICD-10-CM | POA: Diagnosis not present

## 2016-08-21 DIAGNOSIS — D649 Anemia, unspecified: Secondary | ICD-10-CM | POA: Diagnosis not present

## 2016-08-21 DIAGNOSIS — E1165 Type 2 diabetes mellitus with hyperglycemia: Secondary | ICD-10-CM | POA: Diagnosis not present

## 2016-08-21 DIAGNOSIS — D72829 Elevated white blood cell count, unspecified: Secondary | ICD-10-CM | POA: Diagnosis not present

## 2016-08-21 DIAGNOSIS — Z794 Long term (current) use of insulin: Secondary | ICD-10-CM | POA: Diagnosis not present

## 2016-08-21 DIAGNOSIS — N390 Urinary tract infection, site not specified: Secondary | ICD-10-CM | POA: Diagnosis not present

## 2016-08-21 DIAGNOSIS — K219 Gastro-esophageal reflux disease without esophagitis: Secondary | ICD-10-CM | POA: Diagnosis not present

## 2016-08-21 NOTE — Patient Outreach (Signed)
[facility]) Care Management  08/21/2016  MEKA ALESSANDRA 1941/06/24 [id number]  REFERRAL DATE: 08/11/16 REFERRAL SOURCE: EMMI stroke transition program REFERRAL REASON: EMMI stroke program follow up outreach CONSENT: Self/ Patient gave verbal authorization to speak with her daughter, Leonette Monarch regarding all of her personal health information.   PROVIDER:  Dr. Asencion Noble: primary MD Dr. Vernetta Honey - hematology/ oncology Dr. Erlinda Hong - neurology  SOCIAL:  Patient lives with her daughter, Leonette Monarch Patient uses walker for ambulation Home health providing occupational/physical therapy - [facility] Patients daughter manages patients medications and provides transportation   SUBJECTIVE:  Telephone call to patient regarding EMMI stroke follow up.  Spoke with patients daughter/ caregiver, Leonette Monarch.  HIPAA verified by daughter for patient.  Daughter states patient is doing really well. Daughter states patient is walking in the home without assistive device now.  States patients home health physical therapy will end today and patient will start outpatient physical therapy.  Daughter states occupational therapy will continue to work with patient regarding shower safety due to patients left leg still being weak.  Daughter states she and patient take walks outside and patient uses her walker. States patient is getting into and out of the car better. Daughter states she is taking patient out to eat and to the movies.  Daughter states she was concerned over the weekend about patients cognitive status.  States patient appeared a little confused for a short period of time.  Daughter states that has since resolved.   Daughter states patient is not sleeping.  Daughter states patient complains of have anxiety. States patient is on celexa.  Daughter states she will be calling patients primary MD today to discuss. Daughter denies patient having any recent falls. RNCM discussed  with daughter that fall Daughter states patients blood sugars have stabilized since patient is eating better. Reports patients fasting blood sugar this morning was 119.   Daughter reports patients blood pressures have been within normal limits.  Daughter states patients blood pressures and blood sugars are recorded and they will take the recordings to patients next primary MD appointment.  Daughter states patient continues to take her medications as prescribed by her doctors. Daughter denies any new onset of symptoms. RNCM reviewed signs/ symptoms of stroke.  Advised to call 911 for stroke like symptoms.  RNCM offered [facility] care management services to daughter for patient. Daughter denies need for services at this time. Daughter verbally agreed to telephone outreach from [facility] within 1 week.   ASSESSMENT: Per patients daughter, patient progressing well with medical status.  Denies need for [facility] care management services.  Agreed to 1 more follow up call with RNCM.  PLAN:  RNCM will follow up with patient within 1 week. Patients daughter will report to [facility] if EMMI education material received.  Daughter will report on calling patients primary MD regarding patient not sleeping.   Quinn Plowman RN,BSN,CCM [facility] Telephonic  [phone]

## 2016-08-25 ENCOUNTER — Telehealth: Payer: Self-pay | Admitting: Hematology

## 2016-08-25 ENCOUNTER — Other Ambulatory Visit: Payer: Self-pay

## 2016-08-25 ENCOUNTER — Telehealth: Payer: Self-pay | Admitting: Neurology

## 2016-08-25 DIAGNOSIS — E1165 Type 2 diabetes mellitus with hyperglycemia: Secondary | ICD-10-CM | POA: Diagnosis not present

## 2016-08-25 DIAGNOSIS — D72829 Elevated white blood cell count, unspecified: Secondary | ICD-10-CM | POA: Diagnosis not present

## 2016-08-25 DIAGNOSIS — N390 Urinary tract infection, site not specified: Secondary | ICD-10-CM | POA: Diagnosis not present

## 2016-08-25 DIAGNOSIS — I69354 Hemiplegia and hemiparesis following cerebral infarction affecting left non-dominant side: Secondary | ICD-10-CM | POA: Diagnosis not present

## 2016-08-25 DIAGNOSIS — E785 Hyperlipidemia, unspecified: Secondary | ICD-10-CM | POA: Diagnosis not present

## 2016-08-25 DIAGNOSIS — D649 Anemia, unspecified: Secondary | ICD-10-CM | POA: Diagnosis not present

## 2016-08-25 DIAGNOSIS — Z794 Long term (current) use of insulin: Secondary | ICD-10-CM | POA: Diagnosis not present

## 2016-08-25 DIAGNOSIS — K219 Gastro-esophageal reflux disease without esophagitis: Secondary | ICD-10-CM | POA: Diagnosis not present

## 2016-08-25 DIAGNOSIS — I10 Essential (primary) hypertension: Secondary | ICD-10-CM | POA: Diagnosis not present

## 2016-08-25 DIAGNOSIS — T380X5D Adverse effect of glucocorticoids and synthetic analogues, subsequent encounter: Secondary | ICD-10-CM | POA: Diagnosis not present

## 2016-08-25 NOTE — Telephone Encounter (Signed)
Daughter called to advise, patient is having swelling in feet and ankles, left ankle and foot, states she had this when she came home from hospital but it has gotten progressively worse, states she contacted PCP who advised to call Neurologist to follow up with whether to be seen or if this is just normal.

## 2016-08-25 NOTE — Telephone Encounter (Signed)
Called the daughter and discussed with her over the phone. She stated that pt still has mainly left foot and ankle mild swelling since discharge. May be a little worse over time. Right foot and ankle subtle swelling. However, as per daughter, for the last 3 weeks after discharge, she cannot sleep at night, and she refused to lie down at night. Therefore, she has been sleeping in chair without leg lifting up. Her PCP recently give her antidepressant for anxiety treatment, she was able to get some sleep last night but still struggling with insomnia. I told daughter that this position likely the cause for patient's legs and ankle swelling. I'll encourage her to put leg and foot in the elevated position at night to help out for the swelling.  She has been on amlodipine for one half years so that I don't think amlodipine is the cause for her ankle swelling. Daughter understood and she will talk to patient regarding my recommendations.  Rosalin Hawking, MD PhD Stroke Neurology 08/25/2016 4:12 PM

## 2016-08-25 NOTE — Telephone Encounter (Signed)
Spoke with patient dtr re appointments for 10/25 and 11/6. Patient did not return to lab on 10/16.

## 2016-08-25 NOTE — Patient Outreach (Signed)
[facility]) Care Management  08/25/2016  Theresa Kramer [birth year] [id number]  REFERRAL DATE: 08/11/16 REFERRAL SOURCE: EMMI stroke transition program REFERRAL REASON: EMMI stroke program follow up outreach CONSENT: Self/ Patient gave verbal authorization to speak with her daughter, Theresa Kramer regarding all of her personal health information.   PROVIDER:  Dr. Asencion Noble: primary MD Dr. Vernetta Honey - hematology/ oncology Dr. Erlinda Hong - neurology  SOCIAL:  Patient lives with her daughter, Theresa Kramer Patient uses walker for ambulation Home health providing occupational/physical therapy - [facility] Patients daughter manages patients medications and provides transportation  SUBJECTIVE; RNCM returned call to patients daughter, Theresa Kramer as requested.  HIPAA verified by daughter for patient. Daughter states patient has been having swelling in her feet and ankles. Daughter states patient has had a history of some swelling in her feet and ankles but seems to have increased some. Daughter states she called patients primary MD office and was instructed to follow up with the neurologist.  Daughter states she will be calling patients neurologist today.   RNCM advised daughter to have patient elevate her legs when sitting or laying.  Daughter states she has also applied ice to patients feet/ankles to help with the swelling.  Daughter states patient is not as compliant with elevating her legs. Daughter states patient continues to take her medication as prescribed.  RNCM inquired about patients concern with sleeping at night and if doctor had been contacted.  Daughter states patients primary MD gave patient prescription for trazodone.  States patient started medication on 08/21/16.  Daughter states patient has been sleeping better with the medication.    ASSESSMENT:  EMMI stroke follow up PLAN;  Patient daughter will contact patients neurologist to discuss symptom of  swelling RNCM will follow up with daughter and/or patient within 3 business days.   Quinn Plowman RN,BSN,CCM [facility] Telephonic  [phone]

## 2016-08-26 ENCOUNTER — Other Ambulatory Visit: Payer: Medicare Other

## 2016-08-26 ENCOUNTER — Other Ambulatory Visit: Payer: Self-pay

## 2016-08-26 ENCOUNTER — Ambulatory Visit: Payer: Self-pay

## 2016-08-26 ENCOUNTER — Ambulatory Visit (HOSPITAL_COMMUNITY)
Admission: RE | Admit: 2016-08-26 | Discharge: 2016-08-26 | Disposition: A | Discharge status: Home / Self Care | Payer: Medicare Other | Source: Ambulatory Visit | Attending: Hematology | Admitting: Hematology

## 2016-08-26 DIAGNOSIS — I2510 Atherosclerotic heart disease of native coronary artery without angina pectoris: Secondary | ICD-10-CM | POA: Diagnosis not present

## 2016-08-26 DIAGNOSIS — D47Z9 Other specified neoplasms of uncertain behavior of lymphoid, hematopoietic and related tissue: Secondary | ICD-10-CM | POA: Diagnosis not present

## 2016-08-26 DIAGNOSIS — K449 Diaphragmatic hernia without obstruction or gangrene: Secondary | ICD-10-CM | POA: Insufficient documentation

## 2016-08-26 DIAGNOSIS — I700 Atherosclerosis of aorta: Secondary | ICD-10-CM | POA: Insufficient documentation

## 2016-08-26 DIAGNOSIS — R161 Splenomegaly, not elsewhere classified: Secondary | ICD-10-CM | POA: Diagnosis not present

## 2016-08-26 DIAGNOSIS — D479 Neoplasm of uncertain behavior of lymphoid, hematopoietic and related tissue, unspecified: Secondary | ICD-10-CM

## 2016-08-26 DIAGNOSIS — K4090 Unilateral inguinal hernia, without obstruction or gangrene, not specified as recurrent: Secondary | ICD-10-CM | POA: Diagnosis not present

## 2016-08-26 LAB — GLUCOSE, CAPILLARY: Glucose-Capillary: 68 mg/dL (ref 65–99)

## 2016-08-26 MED ORDER — FLUDEOXYGLUCOSE F - 18 (FDG) INJECTION
10.7800 | Freq: Once | INTRAVENOUS | Status: AC | PRN
  Administered 2016-08-26: 10.78 via INTRAVENOUS

## 2016-08-26 NOTE — Patient Outreach (Signed)
[facility]) Care Management  08/26/2016  Theresa Kramer [birth year] [id number]  REFERRAL DATE: 08/11/16 REFERRAL SOURCE: EMMI stroke transition program REFERRAL REASON: EMMI stroke program follow up outreach CONSENT: Self/ Patient gave verbal authorization to speak with her daughter, Theresa Kramer regarding all of her personal health information.   PROVIDER:  Dr. Asencion Noble: primary MD Dr. Vernetta Honey - hematology/ oncology Dr. Erlinda Hong - neurology  SOCIAL:  Patient lives with her daughter, Theresa Kramer Patient uses walker for ambulation Home health providing occupational/physical therapy - [facility] Patients daughter manages patients medications and provides transportation  SUBJECTIVE:  Telephone call to patients daughter, Theresa Kramer. HIPAA verified with daughter. Daughter states she spoke with Dr. Erlinda Hong, neurologist about patients feet and ankles swelling.  Daughter states doctor states the swelling is because patient has not been sleeping over the past couple of weeks and she is up on her feet a lot. Daughter states she was advised to have patient lay flat as tolerated to see if this helps the swelling. Daughter states patient denies any pain in her feet and ankles and patient is walking fine.    PLAN; RNCM will follow up with daughter and/or patient at next scheduled outreach.  Quinn Plowman RN,BSN,CCM [facility] Telephonic  [phone]

## 2016-08-27 ENCOUNTER — Encounter: Payer: Self-pay | Admitting: *Deleted

## 2016-08-27 ENCOUNTER — Other Ambulatory Visit (HOSPITAL_COMMUNITY)
Admission: RE | Admit: 2016-08-27 | Discharge: 2016-08-27 | Disposition: A | Discharge status: Home / Self Care | Payer: Medicare Other | Source: Ambulatory Visit | Attending: Hematology | Admitting: Hematology

## 2016-08-27 ENCOUNTER — Other Ambulatory Visit: Payer: Medicare Other

## 2016-08-27 DIAGNOSIS — D649 Anemia, unspecified: Secondary | ICD-10-CM | POA: Diagnosis not present

## 2016-08-27 DIAGNOSIS — D479 Neoplasm of uncertain behavior of lymphoid, hematopoietic and related tissue, unspecified: Secondary | ICD-10-CM

## 2016-08-27 LAB — CBC & DIFF AND RETIC
BASO%: 0.3 % (ref 0.0–2.0)
Basophils Absolute: 0.1 10*3/uL (ref 0.0–0.1)
EOS%: 1 % (ref 0.0–7.0)
Eosinophils Absolute: 0.2 10*3/uL (ref 0.0–0.5)
HCT: 32.8 % — ABNORMAL LOW (ref 34.8–46.6)
HGB: 10.7 g/dL — ABNORMAL LOW (ref 11.6–15.9)
Immature Retic Fract: 14.3 % — ABNORMAL HIGH (ref 1.60–10.00)
LYMPH#: 16.9 10*3/uL — AB (ref 0.9–3.3)
LYMPH%: 77.9 % — ABNORMAL HIGH (ref 14.0–49.7)
MCH: 26.1 pg (ref 25.1–34.0)
MCHC: 32.6 g/dL (ref 31.5–36.0)
MCV: 80 fL (ref 79.5–101.0)
MONO#: 0.1 10*3/uL (ref 0.1–0.9)
MONO%: 0.5 % (ref 0.0–14.0)
NEUT#: 4.4 10*3/uL (ref 1.5–6.5)
NEUT%: 20.3 % — AB (ref 38.4–76.8)
PLATELETS: 370 10*3/uL (ref 145–400)
RBC: 4.1 10*6/uL (ref 3.70–5.45)
RDW: 16.2 % — AB (ref 11.2–14.5)
RETIC %: 1.97 % (ref 0.70–2.10)
RETIC CT ABS: 80.77 10*3/uL (ref 33.70–90.70)
WBC: 21.7 10*3/uL — ABNORMAL HIGH (ref 3.9–10.3)

## 2016-08-27 LAB — COMPREHENSIVE METABOLIC PANEL
ALT: 25 U/L (ref 0–55)
ANION GAP: 14 meq/L — AB (ref 3–11)
AST: 19 U/L (ref 5–34)
Albumin: 3.3 g/dL — ABNORMAL LOW (ref 3.5–5.0)
Alkaline Phosphatase: 103 U/L (ref 40–150)
BUN: 19.6 mg/dL (ref 7.0–26.0)
CHLORIDE: 102 meq/L (ref 98–109)
CO2: 27 meq/L (ref 22–29)
CREATININE: 0.8 mg/dL (ref 0.6–1.1)
Calcium: 9.6 mg/dL (ref 8.4–10.4)
EGFR: 67 mL/min/{1.73_m2} — AB (ref >90)
Glucose: 47 mg/dl — ABNORMAL LOW (ref 70–140)
POTASSIUM: 3.7 meq/L (ref 3.5–5.1)
Sodium: 143 mEq/L (ref 136–145)
Total Bilirubin: 0.31 mg/dL (ref 0.20–1.20)
Total Protein: 7.8 g/dL (ref 6.4–8.3)

## 2016-08-27 LAB — CHCC SMEAR

## 2016-08-27 LAB — TECHNOLOGIST REVIEW

## 2016-08-27 LAB — LACTATE DEHYDROGENASE: LDH: 239 U/L (ref 125–245)

## 2016-08-27 NOTE — Progress Notes (Signed)
Blood glucose: 47 with morning labs. RN contacted daughter [name] [facility]) and informed her of recent labs. [name] stated that she is aware that patient glucose was low. Patient checked her blood glucose this morning and it was in the 70s and she didn't eat before her labs because she thought fasting was needed. Made [name] aware that fasting is not needed before labs. [name] stated that patient did receive something to eat after she left [facility] and that they will be checking glucose shortly.

## 2016-08-28 ENCOUNTER — Ambulatory Visit: Payer: Self-pay

## 2016-08-28 LAB — KAPPA/LAMBDA LIGHT CHAINS
IG KAPPA FREE LIGHT CHAIN: 16.7 mg/L (ref 3.3–19.4)
IG LAMBDA FREE LIGHT CHAIN: 76.6 mg/L — AB (ref 5.7–26.3)
KAPPA/LAMBDA FLC RATIO: 0.22 — AB (ref 0.26–1.65)

## 2016-08-28 LAB — SEDIMENTATION RATE: Sedimentation Rate-Westergren: 46 mm/hr — ABNORMAL HIGH (ref 0–40)

## 2016-08-28 LAB — HEPATITIS C ANTIBODY: Hep C Virus Ab: <0.1 s/co ratio (ref 0.0–0.9)

## 2016-08-28 LAB — HIV ANTIBODY (ROUTINE TESTING W REFLEX): HIV Screen 4th Generation wRfx: NONREACTIVE

## 2016-08-28 LAB — C-REACTIVE PROTEIN: CRP: 30.1 mg/L — AB (ref 0.0–4.9)

## 2016-08-29 ENCOUNTER — Encounter: Payer: Medicare Other | Admitting: Hematology

## 2016-08-30 DIAGNOSIS — I10 Essential (primary) hypertension: Secondary | ICD-10-CM | POA: Diagnosis not present

## 2016-08-30 DIAGNOSIS — E1165 Type 2 diabetes mellitus with hyperglycemia: Secondary | ICD-10-CM | POA: Diagnosis not present

## 2016-08-30 DIAGNOSIS — D649 Anemia, unspecified: Secondary | ICD-10-CM | POA: Diagnosis not present

## 2016-08-30 DIAGNOSIS — I69354 Hemiplegia and hemiparesis following cerebral infarction affecting left non-dominant side: Secondary | ICD-10-CM | POA: Diagnosis not present

## 2016-08-30 DIAGNOSIS — Z794 Long term (current) use of insulin: Secondary | ICD-10-CM | POA: Diagnosis not present

## 2016-08-30 DIAGNOSIS — D72829 Elevated white blood cell count, unspecified: Secondary | ICD-10-CM | POA: Diagnosis not present

## 2016-08-30 DIAGNOSIS — K219 Gastro-esophageal reflux disease without esophagitis: Secondary | ICD-10-CM | POA: Diagnosis not present

## 2016-08-30 DIAGNOSIS — E785 Hyperlipidemia, unspecified: Secondary | ICD-10-CM | POA: Diagnosis not present

## 2016-08-30 DIAGNOSIS — N390 Urinary tract infection, site not specified: Secondary | ICD-10-CM | POA: Diagnosis not present

## 2016-08-30 DIAGNOSIS — T380X5D Adverse effect of glucocorticoids and synthetic analogues, subsequent encounter: Secondary | ICD-10-CM | POA: Diagnosis not present

## 2016-08-30 LAB — MULTIPLE MYELOMA PANEL, SERUM
ALBUMIN SERPL ELPH-MCNC: 3.5 g/dL (ref 2.9–4.4)
ALBUMIN/GLOB SERPL: 1 (ref 0.7–1.7)
Alpha 1: 0.3 g/dL (ref 0.0–0.4)
Alpha2 Glob SerPl Elph-Mcnc: 1.4 g/dL — ABNORMAL HIGH (ref 0.4–1.0)
B-GLOBULIN SERPL ELPH-MCNC: 1.2 g/dL (ref 0.7–1.3)
GAMMA GLOB SERPL ELPH-MCNC: 0.8 g/dL (ref 0.4–1.8)
GLOBULIN, TOTAL: 3.7 g/dL (ref 2.2–3.9)
IgA, Qn, Serum: 143 mg/dL (ref 64–422)
IgG, Qn, Serum: 599 mg/dL — ABNORMAL LOW (ref 700–1600)
IgM, Qn, Serum: 318 mg/dL — ABNORMAL HIGH (ref 26–217)
M PROTEIN SERPL ELPH-MCNC: 0.3 g/dL — AB
Total Protein: 7.2 g/dL (ref 6.0–8.5)

## 2016-09-01 LAB — TISSUE HYBRIDIZATION TO NCBH

## 2016-09-02 ENCOUNTER — Other Ambulatory Visit: Payer: Self-pay

## 2016-09-02 NOTE — Patient Outreach (Signed)
[facility]) Care Management  09/02/2016  KRISTIE ZESIGER [birth year] [id number]  REFERRAL DATE: 08/11/16 REFERRAL SOURCE: EMMI stroke transition program REFERRAL REASON: EMMI stroke program follow up outreach CONSENT: Self/ Patient gave verbal authorization to speak with her daughter, Leonette Monarch regarding all of her personal health information.   PROVIDER:  Dr. Asencion Noble: primary MD Dr. Vernetta Honey - hematology/ oncology Dr. Erlinda Hong - neurology  SOCIAL:  Patient lives with her daughter, Leonette Monarch Patient uses walker for ambulation Home health providing occupational/physical therapy - [facility] Patients daughter manages patients medications and provides transportation  SUBJECTIVE: Telephone call to patients daughter, Leonette Monarch.  HIPAA confirmed for patient by daughter.  Daughter states, "patient is doing fabulous."  States patient is doing well and almost better than before the stroke.  Daughter states patient is able to walk without her walker short distances.  States patient has been able to do some household chores like washing her own clothes and cleaning her bathroom.  Daughter states patients home health physical therapy and occupational therapy has ended. Daughter states patient still deals with some anxiety at night.  Daughter states patient continues to take her medications as prescribed. Daughter reports patients swelling in her feet/ ankles is much improved.  Daughter states she is working on helping patient recognize if her blood sugars are low.  Daughter states patient continues to take her blood sugars daily which have been within normal range. Daughter states she is continuing to  Work with patient on safety in the home. Daughter states patient is never left alone at this time.  States long range goal is for patient to be able to spend short periods of  time alone and is safe and managing care. Daughter states she plans on taking patient to the  movies tonight and on a trip over the weekend.   Daughter states she is aware of signs of stroke and to call 911 for stroke like symptoms.  Daughter in agreement that patient be closed to EMMI stroke program at this time.   ASSESSMENT: EMMI stroke program closure.  As reported patient continues to improve and meet short term goals established by daughter/ caregiver.   PLAN:  RNCM will refer patient to case manager assistant to close. RNCM will send patients primary MD closure notice.   Quinn Plowman RN,BSN,CCM [facility] Telephonic  [phone]

## 2016-09-03 LAB — FISH, PERIPHERAL BLOOD

## 2016-09-04 LAB — FISH,CLL PROGNOSTIC PANEL

## 2016-09-08 ENCOUNTER — Ambulatory Visit: Payer: Medicare Other | Admitting: Hematology

## 2016-09-08 DIAGNOSIS — D72829 Elevated white blood cell count, unspecified: Secondary | ICD-10-CM | POA: Diagnosis not present

## 2016-09-08 DIAGNOSIS — I69015 Cognitive social or emotional deficit following nontraumatic subarachnoid hemorrhage: Secondary | ICD-10-CM | POA: Diagnosis not present

## 2016-09-17 ENCOUNTER — Other Ambulatory Visit: Payer: Self-pay | Admitting: Pharmacist

## 2016-09-17 NOTE — Patient Outreach (Signed)
[facility]) Care Management  09/17/2016  Theresa Kramer 1941-02-20 [id number]  75 year old female referred to [place] for medication assistance. This is a EMMI stroke patient that is on the all payor list. Patient has given verbal authorization to speak with her daughter, Theresa Kramer regarding all of her personal health information. Today via telephone patients daughter states patient is in the Medicare coverage gap and is having trouble affording Lantus and Humalog which has a copay of $300-400. Patients daughter states they have not asked about samples from patients provider and has not ever applied for low income subsidy/extra help.     Assessment: Medication Assistance: [employer] patient currently in the Medicare coverage gap having difficulty affording Lantus and Humalog with copays of $300-400 each.  Patient lives with daughter but daughter does not support her or claim as dependent.  Patient does receive social security income as her only source of income.  Patient may be eligible for low income subsidy/extra help and also manufacturer patient assistance (if still accepting applications at this time of the year).   Plan: Patients daughter will contact Dr Asencion Noble for insulin samples if they are available. I will also send notification Daughter believes that patient can afford insulin for the rest of 2017 but would like to apply for low income subsidy/extra help.  Will plan followup telephone call for Friday to complete application over the phone once daughter gathers income documents.   Will discuss transferring patients medications to [facility] as 90 day supply of Tier 1 and Tier 2 medications are $0 through mail order.   Bennye Alm, PharmD [facility] PGY2 Pharmacy Resident [phone]

## 2016-09-19 ENCOUNTER — Other Ambulatory Visit: Payer: Self-pay | Admitting: Pharmacist

## 2016-09-22 ENCOUNTER — Other Ambulatory Visit: Payer: Self-pay

## 2016-09-22 ENCOUNTER — Other Ambulatory Visit: Payer: Self-pay | Admitting: *Deleted

## 2016-09-22 ENCOUNTER — Telehealth: Payer: Self-pay | Admitting: Hematology

## 2016-09-22 ENCOUNTER — Ambulatory Visit (INDEPENDENT_AMBULATORY_CARE_PROVIDER_SITE_OTHER): Payer: Medicare Other | Admitting: Nurse Practitioner

## 2016-09-22 ENCOUNTER — Telehealth: Payer: Self-pay | Admitting: Neurology

## 2016-09-22 ENCOUNTER — Encounter: Payer: Self-pay | Admitting: Hematology

## 2016-09-22 ENCOUNTER — Encounter: Payer: Self-pay | Admitting: Nurse Practitioner

## 2016-09-22 ENCOUNTER — Ambulatory Visit (HOSPITAL_BASED_OUTPATIENT_CLINIC_OR_DEPARTMENT_OTHER): Payer: Medicare Other | Admitting: Hematology

## 2016-09-22 VITALS — BP 137/64 | HR 100 | Temp 98.4°F | Resp 18 | Ht 64.0 in | Wt 220.2 lb

## 2016-09-22 VITALS — BP 134/64 | HR 81 | Ht 64.0 in | Wt 219.8 lb

## 2016-09-22 DIAGNOSIS — E119 Type 2 diabetes mellitus without complications: Secondary | ICD-10-CM

## 2016-09-22 DIAGNOSIS — I63521 Cerebral infarction due to unspecified occlusion or stenosis of right anterior cerebral artery: Secondary | ICD-10-CM

## 2016-09-22 DIAGNOSIS — D472 Monoclonal gammopathy: Secondary | ICD-10-CM | POA: Diagnosis not present

## 2016-09-22 DIAGNOSIS — C9110 Chronic lymphocytic leukemia of B-cell type not having achieved remission: Secondary | ICD-10-CM

## 2016-09-22 DIAGNOSIS — E785 Hyperlipidemia, unspecified: Secondary | ICD-10-CM

## 2016-09-22 DIAGNOSIS — I10 Essential (primary) hypertension: Secondary | ICD-10-CM

## 2016-09-22 DIAGNOSIS — E538 Deficiency of other specified B group vitamins: Secondary | ICD-10-CM

## 2016-09-22 DIAGNOSIS — D649 Anemia, unspecified: Secondary | ICD-10-CM

## 2016-09-22 DIAGNOSIS — Z794 Long term (current) use of insulin: Secondary | ICD-10-CM

## 2016-09-22 DIAGNOSIS — I6350 Cerebral infarction due to unspecified occlusion or stenosis of unspecified cerebral artery: Secondary | ICD-10-CM

## 2016-09-22 MED ORDER — CLOPIDOGREL BISULFATE 75 MG PO TABS: 75.0000 mg | ORAL_TABLET | Freq: Every day | ORAL | 1 refills | Status: DC

## 2016-09-22 NOTE — Patient Instructions (Addendum)
Stressed the importance of management of risk factors to prevent further stroke Continue Plavix for secondary stroke prevention Maintain strict control of hypertension with blood pressure goal below 130/90, today's reading 134/64 continue antihypertensive medications Control of diabetes with hemoglobin A1c below 6.5 followed by primary care most recent hemoglobin A1c 8.3 continue diabetic medications Cholesterol with LDL cholesterol less than 70, followed by primary care,  most recent one 122 continue statin drugs Exercise by walking, home exercise program slowly increase , eat healthy diet with whole grains,  fresh fruits and vegetables Cognitive activities include cross words strategy games, exercise Discussed risk for recurrent stroke/ TIA and answered additional questions Will set up for 30 day event monitor  F/U in 3  months

## 2016-09-22 NOTE — Progress Notes (Signed)
[facility]  PATIENT: Theresa Kramer DOB: 11-12-1940   REASON FOR VISIT: Hospital follow-up for stroke HISTORY FROM: Patient and daughter Theresa Kramer    HISTORY OF PRESENT ILLNESS:Theresa Kramer a 75 y.o.femalewith a past medical history significant for HTN, IDDMwho presented with left sided weakness on 08/02/16.She was also admitted last week with nonspecific abnormal behaviors thought initially to be related to TIA versus delirium from some underlying neurologic stressor. MRI/MRA were obtained which showed some punctate findings in the right ACA distribution, ultimately thought by stroke team not to be infarction, andthe patient was treated for a UTI, and hydrated. Echocardiogram was obtained that was normal. 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 Patient still complained of Urinary Symptoms so she was treated initially with po Ciprofloxacin but switched to IV Ceftriaxone to complete her Abx Course. Because of her immunocompromised state she was also treated for the yeast in her urine. Throughout the course of the hospitalization the patient progressed and PT/OT recommended Home Physical Therapy and Occupational Therapy. Patient was also noticed to have an elevated Leukocyte Count with Atypical Mononuclear Cells and Lymphocytes and Heme/Onc was called and they recommended Flow Cytometry to evaluate for CLL and follow up as an outpatient. Patient at this time was deemed medically stable to be D/C'd Home and is to follow up with PCP, Hematology/Oncology, and Neurology as an outpatient. She follows up in the stroke clinic today for hospital follow-up. 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 Carotid Doppler was unremarkable 2-D echo EF 60-65%. 30 day event monitoring is suggested we set  up. LDL 122 hemoglobin A1c 8.3. She is currently on Plavix without further stroke or TIA symptoms. Minimal bruising or bleeding she has had several adjustments to her insulin dose since discharge. Her blood pressures are well controlled. Her therapies concluded last week however she is not doing a home exercise program. She has trazodone to take for sleep and risperidone for anxiety. She returns for reevaluation. She has follow-up with hematology this afternoon   REVIEW OF SYSTEMS: Full 14 system review of systems performed and notable only for those listed, all others are neg:  Constitutional: neg  Cardiovascular: neg Ear/Nose/Throat: neg  Skin: neg Eyes: neg Respiratory: neg Gastroitestinal: neg  Hematology/Lymphatic: Easy bruising Endocrine: Intolerance to cold Musculoskeletal: Muscle cramps Allergy/Immunology: neg Neurological: Mild cognitive impairment Psychiatric: Depression and anxiety Sleep : neg   ALLERGIES: Allergies  Allergen Reactions  . Meloxicam Other (See Comments)    Stomach pains    HOME MEDICATIONS: Outpatient Medications Prior to Visit  Medication Sig Dispense Refill  . acetaminophen (TYLENOL) 650 MG CR tablet Take 1,300 mg by mouth 2 (two) times daily.    Marland Kitchen amLODipine (NORVASC) 5 MG tablet Take 5 mg by mouth daily.    . benzonatate (TESSALON) 200 MG capsule Take 200 mg by mouth 3 (three) times daily as needed for cough.    . citalopram (CELEXA) 20 MG tablet Take 20 mg by mouth daily.      . clopidogrel (PLAVIX) 75 MG tablet Take 1 tablet (75 mg total) by mouth daily. 30 tablet 1  . clotrimazole (LOTRIMIN) 1 % cream Apply 1 application topically 2 (two) times daily as needed. For rash     . cyanocobalamin (,VITAMIN B-12,) 1000 MCG/ML injection Inject 1,000 mcg into the  muscle every 30 (thirty) days. Usually on the 1st     . gemfibrozil (LOPID) 600 MG tablet Take 600 mg by mouth 2 (two) times daily before a meal.      . glipiZIDE (GLUCOTROL XL) 5 MG 24 hr  tablet Take 5 mg by mouth daily.      Marland Kitchen HYDROcodone-homatropine (HYCODAN) 5-1.5 MG/5ML syrup Take 5 mLs by mouth every 4 (four) hours as needed for cough.    . insulin glargine (LANTUS) 100 UNIT/ML injection Inject 65 Units into the skin at bedtime.     . insulin lispro (HUMALOG) 100 UNIT/ML injection Inject 12 Units into the skin 3 (three) times daily before meals.     Marland Kitchen losartan-hydrochlorothiazide (HYZAAR) 100-12.5 MG tablet Take 1 tablet by mouth daily.    . metFORMIN (GLUCOPHAGE) 1000 MG tablet Take 1,000 mg by mouth 2 (two) times daily with a meal.      . oxybutynin (DITROPAN-XL) 10 MG 24 hr tablet Take 10 mg by mouth daily.     . pantoprazole (PROTONIX) 40 MG tablet Take 40 mg by mouth daily.    . pramipexole (MIRAPEX) 0.25 MG tablet Take 0.25 mg by mouth daily.      . pravastatin (PRAVACHOL) 80 MG tablet Take 1 tablet (80 mg total) by mouth daily. 30 tablet 3  . ranitidine (ZANTAC) 150 MG tablet Take 150 mg by mouth at bedtime.    . traMADol (ULTRAM) 50 MG tablet Take 1 tablet (50 mg total) by mouth every 6 (six) hours as needed for moderate pain. 20 tablet 0   No facility-administered medications prior to visit.     PAST MEDICAL HISTORY: Past Medical History:  Diagnosis Date  . Arthritis   . B12 deficiency   . Bronchitis    hx of  . Complication of anesthesia   . Depression    "not taking medication"  . Diabetes mellitus   . GERD (gastroesophageal reflux disease)   . H/O hiatal hernia   . Hyperlipidemia   . Hypertension   . Noncompliance with medication regimen   . Pinched cervical nerve root   . PONV (postoperative nausea and vomiting)   . Urinary tract infection    hx of    PAST SURGICAL HISTORY: Past Surgical History:  Procedure Laterality Date  . ABDOMINAL HYSTERECTOMY    . ANTERIOR CERVICAL DECOMP/DISCECTOMY FUSION  03/15/2012   Procedure: ANTERIOR CERVICAL DECOMPRESSION/DISCECTOMY FUSION 2 LEVELS;  Surgeon: Ophelia Charter, MD;  Location: [facility];   Service: Neurosurgery;  Laterality: N/A;  Cervical four-five Cervical five six anterior cervical decompression with fusion interbody prothesis plating and bonegraft  . APPENDECTOMY    . BONE CYST EXCISION     from lower back and foot  . CHOLECYSTECTOMY    . COLONOSCOPY  2011   Dr. Gala Romney: prep compromised exam. diverticulosis, hemorrhoids. next tcs 2021  . DILATION AND CURETTAGE OF UTERUS    . 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 Multiple distal rings dilated and disrupted as described above. Small hiatal hernia.   Marland Kitchen EXCISION METACARPAL MASS Left 03/03/2016   Procedure: EXCISION OF LEFT  DORSAL MASS OF EXTENSOR TENDON;  Surgeon: Milly Jakob, MD;  Location: [facility];  Service: Orthopedics;  Laterality: Left;  . EYE SURGERY     bilateral cataract surgery,   . HIP ARTHROPLASTY     right  . JOINT REPLACEMENT    . KNEE SURGERY  torn cartilage repair  . MALONEY DILATION N/A 09/19/2014   Procedure: Venia Minks DILATION;  Surgeon: Daneil Dolin, MD;  Location: [facility];  Service: Endoscopy;  Laterality: N/A;  . ORTHOPEDIC SURGERY    . ROTATOR CUFF REPAIR     "multiple in both shoulders" and total shoulder replacement in left arm  . SAVORY DILATION N/A 09/19/2014   Procedure: SAVORY DILATION;  Surgeon: Daneil Dolin, MD;  Location: [facility];  Service: Endoscopy;  Laterality: N/A;  . TONSILLECTOMY      FAMILY HISTORY: Family History  Problem Relation Age of Onset  . Cirrhosis Brother     etoh  . Colon cancer Neg Hx     SOCIAL HISTORY: Social History   Social History  . Marital status: Widowed    Spouse name: N/A  . Number of children: N/A  . Years of education: N/A   Occupational History  . Not on file.   Social History Main Topics  . Smoking status: Former Research scientist (life sciences)  . Smokeless tobacco: Never Used     Comment: stopped 25 years ago  . Alcohol use No  . Drug use: No  .  Sexual activity: Not on file   Other Topics Concern  . Not on file   Social History Narrative  . No narrative on file     PHYSICAL EXAM  Vitals:   09/22/16 0827  BP: 134/64  Pulse: 81  Weight: 219 lb 12.8 oz (99.7 kg)  Height: 5\' 4"  (1.626 m)   Body mass index is 37.73 kg/m.  Generalized: Well developed,Obese female in  no acute distress  Head: normocephalic and atraumatic,. Oropharynx benign  Neck: Supple, no carotid bruits  Cardiac: Regular rate rhythm, no murmur  Musculoskeletal: No deformity   Neurological examination   Mentation: Alert oriented to time, place, history taking. Attention span and concentration appropriate. Delayed recall 2 of 3   Follows all commands speech and language fluent.   Cranial nerve II-XII: Fundoscopic exam not done. Pupils were equal round reactive to light extraocular movements were full, visual field were full on confrontational test. Facial sensation and strength were normal. hearing was intact to finger rubbing bilaterally. Uvula tongue midline. head turning and shoulder shrug were normal and symmetric.Tongue protrusion into cheek strength was normal. Motor: normal bulk and tone, full strength in the BUE, BLE,  except left foot 4 out of 5 DF/PF fine finger movements normal,mild  left arm drift.  Sensory: normal and symmetric to light touch, pinprick, and  Vibration, in the upper and lower extremities  Coordination: finger-nose-finger, heel-to-shin bilaterally, no dysmetria Reflexes: 1+ upper lower and symmetric plantar responses were flexor bilaterally. Gait and Station: Rising up from seated position without assistance, normal stance,  moderate stride, good arm swing, smooth turning, able to perform tiptoe, and heel walking without difficulty. Tandem gait is unsteady. No assistive device  DIAGNOSTIC DATA (LABS, IMAGING, TESTING) - I reviewed patient records, labs, notes, testing and imaging myself where available.  Lab Results  Component  Value Date   WBC 21.7 (H) 08/27/2016   HGB 10.7 (L) 08/27/2016   HCT 32.8 (L) 08/27/2016   MCV 80.0 08/27/2016   PLT 370 08/27/2016      Component Value Date/Time   NA 143 08/27/2016 0739   K 3.7 08/27/2016 0739   CL 98 (L) 08/04/2016 0641   CO2 27 08/27/2016 0739   GLUCOSE 47 (L) 08/27/2016 0739   BUN 19.6 08/27/2016 0739   CREATININE 0.8 08/27/2016 0739  CALCIUM 9.6 08/27/2016 0739   PROT 7.2 08/27/2016 0739   PROT 7.8 08/27/2016 0739   ALBUMIN 3.3 (L) 08/27/2016 0739   AST 19 08/27/2016 0739   ALT 25 08/27/2016 0739   ALKPHOS 103 08/27/2016 0739   BILITOT 0.31 08/27/2016 0739   GFRNONAA >60 08/04/2016 0641   GFRAA >60 08/04/2016 0641   Lab Results  Component Value Date   CHOL 202 (H) 07/30/2016   HDL 28 (L) 07/30/2016   LDLCALC 122 (H) 07/30/2016   TRIG 259 (H) 07/30/2016   CHOLHDL 7.2 07/30/2016   Lab Results  Component Value Date   HGBA1C 8.3 (H) 07/30/2016    ASSESSMENT AND PLAN  75 y.o. 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 She is here for hospital follow-up. The patient is a current patient of Dr. Erlinda Hong  who is out of the office today . This note is sent to the work in doctor.     PLAN: Stressed the importance of management of risk factors to prevent further stroke Continue Plavix for secondary stroke prevention Maintain strict control of hypertension with blood pressure goal below 130/90, today's reading 134/64 continue antihypertensive medications Control of diabetes with hemoglobin A1c below 6.5 followed by primary care most recent hemoglobin A1c 8.3 continue diabetic medications Cholesterol with LDL cholesterol less than 70, followed by primary care,  most recent one 122 continue statin drugs Exercise by walking, home exercise  program slowly increase , eat healthy diet with whole grains,  fresh fruits and vegetables Cognitive activities include cross words strategy games, exercise Discussed risk for recurrent stroke/ TIA and answered additional questions Will set up for 30 day event monitor  F/U in 3  months This was a visit requiring 30 minutes of medical decision making of high complexity with extensive review of history, hospital chart, counseling and answering questions for the patient and daughter Dennie Bible, [facility], [facility], [facility] [address], [place], [place] [address] [phone]

## 2016-09-22 NOTE — Telephone Encounter (Signed)
Gave patient dtr avs report and appointments for January. Per dtr lab schedule one week prior to f/u.

## 2016-09-22 NOTE — Progress Notes (Signed)
I agree with the above plan 

## 2016-09-22 NOTE — Telephone Encounter (Signed)
Patient requesting refill of clopidogrel (PLAVIX) 75 MG tablet Pharmacy: [id number] - [place], [place]

## 2016-09-22 NOTE — Telephone Encounter (Signed)
Patient given refill for plavix. Medication was prescribed by hospital MD. Pt was seen by Dr. Erlinda Hong.

## 2016-09-29 NOTE — Progress Notes (Signed)
Marland Kitchen    HEMATOLOGY/ONCOLOGY CLINIC NOTE  Date of Service: .09/29/2016  Patient Care Team: Asencion Noble, MD as PCP - [place], [facility] as [place] [facility] (Pharmacist)  CHIEF COMPLAINTS/PURPOSE OF CONSULTATION:  Monoclonal B- lymphoproliferative disorder   HISTORY OF PRESENTING ILLNESS:  Theresa Kramer is a wonderful 75 y.o. female who has been referred to [place] by Dr Asencion Noble, MD for evaluation and management of lymphocytosis.  Patient has a h/o B12 def, depression, arthritis and DDD and was recently admitted to the hospital with TIA vs delirium in the setting of UTI. 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 She is here for further evaluation of her newly diagnosed B-cell leukemia/lymphoma. Patient reports no fevers. Has some night sweats but those have been in the setting of hypoglycemia. No overt LNadenopathy. No abdominal pain or distension. No reported weight loss. Has chronic fatigue that hasnt significantly changed recently.   INTERVAL HISTORY  Patient is here for f/u of her workup for a newly diagnosed lymphoproliferative disorder.  She notes no acute new symptoms. PET/CT with no significant LNadenopathy and a mildly enlarged and mildly hypermetabolic spleen. 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 No fevers/chills/nightsweats.  MEDICAL HISTORY:  Past Medical History:  Diagnosis Date  . Arthritis   . B12 deficiency   . Bronchitis    hx of  . Complication of anesthesia   . Depression    "not taking medication"  . Diabetes mellitus   . GERD (gastroesophageal reflux disease)   . H/O hiatal hernia   . Hyperlipidemia   . Hypertension   . Noncompliance with medication regimen   . Pinched cervical nerve root   . PONV (postoperative nausea and vomiting)   . Urinary  tract infection    hx of    SURGICAL HISTORY: Past Surgical History:  Procedure Laterality Date  . ABDOMINAL HYSTERECTOMY    . ANTERIOR CERVICAL DECOMP/DISCECTOMY FUSION  03/15/2012   Procedure: ANTERIOR CERVICAL DECOMPRESSION/DISCECTOMY FUSION 2 LEVELS;  Surgeon: Ophelia Charter, MD;  Location: [place] [facility];  Service: Neurosurgery;  Laterality: N/A;  Cervical four-five Cervical five six anterior cervical decompression with fusion interbody prothesis plating and bonegraft  . APPENDECTOMY    . BONE CYST EXCISION     from lower back and foot  . CHOLECYSTECTOMY    . COLONOSCOPY  2011   Dr. Gala Romney: prep compromised exam. diverticulosis, hemorrhoids. next tcs 2021  . DILATION AND CURETTAGE OF UTERUS    . 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 Multiple distal rings dilated and disrupted as described above. Small hiatal hernia.   Marland Kitchen EXCISION METACARPAL MASS Left 03/03/2016   Procedure: EXCISION OF LEFT  DORSAL MASS OF EXTENSOR TENDON;  Surgeon: Milly Jakob, MD;  Location: [place];  Service: Orthopedics;  Laterality: Left;  . EYE SURGERY     bilateral cataract surgery,   . HIP ARTHROPLASTY     right  . JOINT REPLACEMENT    . KNEE SURGERY     torn cartilage repair  . MALONEY DILATION N/A 09/19/2014   Procedure: Venia Minks DILATION;  Surgeon: Daneil Dolin, MD;  [facility];  Service: Endoscopy;  Laterality: N/A;  . ORTHOPEDIC SURGERY    . ROTATOR CUFF REPAIR     "multiple  in both shoulders" and total shoulder replacement in left arm  . SAVORY DILATION N/A 09/19/2014   Procedure: SAVORY DILATION;  Surgeon: Daneil Dolin, MD;  [facility];  Service: Endoscopy;  Laterality: N/A;  . TONSILLECTOMY      SOCIAL HISTORY: Social History   Social History  . Marital status: Widowed    Spouse name: N/A  . Number of children: N/A  . Years of education: N/A   Occupational  History  . Not on file.   Social History Main Topics  . Smoking status: Former Research scientist (life sciences)  . Smokeless tobacco: Never Used     Comment: stopped 25 years ago  . Alcohol use No  . Drug use: No  . Sexual activity: Not on file   Other Topics Concern  . Not on file   Social History Narrative  . No narrative on file    FAMILY HISTORY: Family History  Problem Relation Age of Onset  . Cirrhosis Brother     etoh  . Colon cancer Neg Hx     ALLERGIES:  is allergic to meloxicam.  MEDICATIONS:  Current Outpatient Prescriptions  Medication Sig Dispense Refill  . acetaminophen (TYLENOL) 650 MG CR tablet Take 1,300 mg by mouth 2 (two) times daily.    Marland Kitchen amLODipine (NORVASC) 5 MG tablet Take 5 mg by mouth daily.    . benzonatate (TESSALON) 200 MG capsule Take 200 mg by mouth 3 (three) times daily as needed for cough.    . citalopram (CELEXA) 20 MG tablet Take 20 mg by mouth daily.      . clopidogrel (PLAVIX) 75 MG tablet Take 1 tablet (75 mg total) by mouth daily. 90 tablet 1  . clotrimazole (LOTRIMIN) 1 % cream Apply 1 application topically 2 (two) times daily as needed. For rash     . cyanocobalamin (,VITAMIN B-12,) 1000 MCG/ML injection Inject 1,000 mcg into the muscle every 30 (thirty) days. Usually on the 1st     . gemfibrozil (LOPID) 600 MG tablet Take 600 mg by mouth 2 (two) times daily before a meal.      . glipiZIDE (GLUCOTROL XL) 5 MG 24 hr tablet Take 5 mg by mouth daily.      Marland Kitchen HYDROcodone-homatropine (HYCODAN) 5-1.5 MG/5ML syrup Take 5 mLs by mouth every 4 (four) hours as needed for cough.    . insulin glargine (LANTUS) 100 UNIT/ML injection Inject 65 Units into the skin at bedtime.     . insulin lispro (HUMALOG) 100 UNIT/ML injection Inject 12 Units into the skin 3 (three) times daily before meals.     Marland Kitchen losartan-hydrochlorothiazide (HYZAAR) 100-12.5 MG tablet Take 1 tablet by mouth daily.    . metFORMIN (GLUCOPHAGE) 1000 MG tablet Take 1,000 mg by mouth 2 (two) times daily with a  meal.      . oxybutynin (DITROPAN-XL) 10 MG 24 hr tablet Take 10 mg by mouth daily.     . pantoprazole (PROTONIX) 40 MG tablet Take 40 mg by mouth daily.    . pramipexole (MIRAPEX) 0.25 MG tablet Take 0.25 mg by mouth daily.      . pravastatin (PRAVACHOL) 80 MG tablet Take 1 tablet (80 mg total) by mouth daily. 30 tablet 3  . ranitidine (ZANTAC) 150 MG tablet Take 150 mg by mouth at bedtime.    . risperiDONE (RISPERDAL) 0.25 MG tablet Take 0.25 mg by mouth 2 (two) times daily.  3  . traMADol (ULTRAM) 50 MG tablet Take 1 tablet (50  mg total) by mouth every 6 (six) hours as needed for moderate pain. 20 tablet 0  . traZODone (DESYREL) 50 MG tablet TAKE 1/2 - 1 TABLET BY MOUTH AT BEDTIME AS NEEDED  1   No current facility-administered medications for this visit.     REVIEW OF SYSTEMS:    10 Point review of Systems was done is negative except as noted above.  PHYSICAL EXAMINATION: ECOG PERFORMANCE STATUS: 2 - Symptomatic, <50% confined to bed  . Vitals:   09/22/16 1606  BP: 137/64  Pulse: 100  Resp: 18  Temp: 98.4 F (36.9 C)   Filed Weights   09/22/16 1606  Weight: 220 lb 3.2 oz (99.9 kg)   .Body mass index is 37.8 kg/m.  GENERAL:alert, in no acute distress and comfortable SKIN: skin color, texture, turgor are normal, no rashes or significant lesions EYES: normal, conjunctiva are pink and non-injected, sclera clear OROPHARYNX:no exudate, no erythema and lips, buccal mucosa, and tongue normal  NECK: supple, no JVD, thyroid normal size, non-tender, without nodularity LYMPH:  no palpable lymphadenopathy in the cervical, axillary or inguinal LUNGS: clear to auscultation with normal respiratory effort HEART: regular rate & rhythm,  no murmurs and no lower extremity edema ABDOMEN: abdomen soft, non-tender, normoactive bowel sounds , no palpable hepatosplenomegaly. Musculoskeletal: no cyanosis of digits and no clubbing  PSYCH: alert & oriented x 3 with fluent speech NEURO: no  focal motor/sensory deficits  LABORATORY DATA:  I have reviewed the data as listed  . CBC Latest Ref Rng & Units 08/27/2016 08/04/2016 08/03/2016  WBC 3.9 - 10.3 10e3/uL 21.7(H) 24.4(H) 23.5(H)  Hemoglobin 11.6 - 15.9 g/dL 10.7(L) 10.6(L) 10.1(L)  Hematocrit 34.8 - 46.6 % 32.8(L) 34.8(L) 33.7(L)  Platelets 145 - 400 10e3/uL 370 321 313   . CBC    Component Value Date/Time   WBC 21.7 (H) 08/27/2016 0739   WBC 24.4 (H) 08/04/2016 0641   RBC 4.10 08/27/2016 0739   RBC 4.16 08/04/2016 0641   HGB 10.7 (L) 08/27/2016 0739   HCT 32.8 (L) 08/27/2016 0739   PLT 370 08/27/2016 0739   MCV 80.0 08/27/2016 0739   MCH 26.1 08/27/2016 0739   MCH 25.5 (L) 08/04/2016 0641   MCHC 32.6 08/27/2016 0739   MCHC 30.5 08/04/2016 0641   RDW 16.2 (H) 08/27/2016 0739   LYMPHSABS 16.9 (H) 08/27/2016 0739   MONOABS 0.1 08/27/2016 0739   EOSABS 0.2 08/27/2016 0739   BASOSABS 0.1 08/27/2016 0739     . CMP Latest Ref Rng & Units 08/27/2016 08/27/2016 08/04/2016  Glucose 70 - 140 mg/dl 47(L) - 200(H)  BUN 7.0 - 26.0 mg/dL 19.6 - 16  Creatinine 0.6 - 1.1 mg/dL 0.8 - 0.73  Sodium 136 - 145 mEq/L 143 - 134(L)  Potassium 3.5 - 5.1 mEq/L 3.7 - 4.4  Chloride 101 - 111 mmol/L - - 98(L)  CO2 22 - 29 mEq/L 27 - 24  Calcium 8.4 - 10.4 mg/dL 9.6 - 9.5  Total Protein 6.0 - 8.5 g/dL 7.8 7.2 7.4  Total Bilirubin 0.20 - 1.20 mg/dL 0.31 - 0.4  Alkaline Phos 40 - 150 U/L 103 - 84  AST 5 - 34 U/L 19 - 38  ALT 0 - 55 U/L 25 - 43   . Lab Results  Component Value Date   LDH 239 08/27/2016        RADIOGRAPHIC STUDIES: I have personally reviewed the radiological images as listed and agreed with the findings in the report. No results found.  PET/CT 10/25/017:  IMPRESSION: 1. Mildly enlarged and mildly hypermetabolic spleen, consistent with the provided history of a lymphoproliferative disorder. 2. No hypermetabolic lymphadenopathy or other hypermetabolic sites of disease. 3. Additional findings include  aortic atherosclerosis, three-vessel coronary atherosclerosis, small hiatal hernia and small fat containing left inguinal hernia.   Electronically Signed   By: Ilona Sorrel M.D.   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 Noted to have elevated wbc/lymphocyte counts end of September. Unknown if she had a more chronic elevation of her WBC counts that might put a timeline on her lymphocytosis.  Differential diagnosis- CD5 neg variant of CLL (about 17-20% patients with CLL could be CD5neg) vs B-PLL vs other B-cell leukemia/lymphoma  PET/CT with no significant LNadenopathy and PET/CT with minimal enlarged and minimally hypermetabolic. 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 ? Related to lymphoproliferative process.  3) h/o B12 deficiency PLAN Finding , on PET/CT and labs were discussed in details with patient. -patient chooses to continue monitoring it currently.  -discussed  With patient with diagnosed , prognosis, treatment oprions and criteria for treatment and all the questions were answered in details.  4). Patient Active Problem List   Diagnosis Date Noted  . Occipital neuralgia   . Acute ischemic stroke ([place])   . Stroke ([place])   . 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 Leukocytosis 08/01/2016  . Urinary tract infectious disease 08/01/2016  . Type 2 diabetes mellitus without complication, with long-term current use of insulin ([place]) 07/30/2016  . Essential hypertension 07/30/2016  . Osteoarthritis 07/30/2016  . Normocytic anemia 07/30/2016  . HLD (hyperlipidemia)   . Schatzki's ring   . Cervical herniated disc 03/15/2012  . GERD 04/29/2010  . CHEST PAIN, ATYPICAL 04/29/2010  . DYSPHAGIA UNSPECIFIED 04/29/2010  -continue rf/u with  PCP.   RTC with Dr Irene Limbo in 2 months  With rpt labs  All of the patients questions were answered with apparent satisfaction. The patient knows to call the clinic with any problems, questions or concerns.  . Orders Placed This Encounter  Procedures  . CBC & Diff and Retic    Standing Status:   Future    Standing Expiration Date:   09/22/2017  . Comprehensive metabolic panel    Standing Status:   Future    Standing Expiration Date:   09/22/2017  . Lactate dehydrogenase    Standing Status:   Future    Standing Expiration Date:   09/22/2017  . Multiple Myeloma Panel (SPEP&IFE w/QIG)    Standing Status:   Future    Standing Expiration Date:   09/22/2017  . Kappa/lambda light chains    Standing Status:   Future    Standing Expiration Date:   10/27/2017    I spent 20 minutes counseling the patient face to face. The total time spent in the appointment was 25 minutes and more than 50% was on counseling and direct patient cares.    Sullivan Lone MD [place] AAHIVMS [facility] Hematology/Oncology Physician [facility]  (Office):       [phone] (Work cell):  [phone] (Fax):           [phone]

## 2016-10-02 ENCOUNTER — Other Ambulatory Visit: Payer: Self-pay | Admitting: Nurse Practitioner

## 2016-10-02 DIAGNOSIS — I639 Cerebral infarction, unspecified: Secondary | ICD-10-CM

## 2016-10-02 DIAGNOSIS — I4891 Unspecified atrial fibrillation: Secondary | ICD-10-CM

## 2016-10-02 DIAGNOSIS — I6350 Cerebral infarction due to unspecified occlusion or stenosis of unspecified cerebral artery: Secondary | ICD-10-CM

## 2016-10-06 ENCOUNTER — Ambulatory Visit (INDEPENDENT_AMBULATORY_CARE_PROVIDER_SITE_OTHER): Payer: Medicare Other

## 2016-10-06 DIAGNOSIS — I4891 Unspecified atrial fibrillation: Secondary | ICD-10-CM

## 2016-10-06 DIAGNOSIS — I639 Cerebral infarction, unspecified: Secondary | ICD-10-CM

## 2016-10-06 DIAGNOSIS — I6350 Cerebral infarction due to unspecified occlusion or stenosis of unspecified cerebral artery: Secondary | ICD-10-CM | POA: Diagnosis not present

## 2016-10-10 DIAGNOSIS — Z8673 Personal history of transient ischemic attack (TIA), and cerebral infarction without residual deficits: Secondary | ICD-10-CM | POA: Diagnosis not present

## 2016-10-10 DIAGNOSIS — E119 Type 2 diabetes mellitus without complications: Secondary | ICD-10-CM | POA: Diagnosis not present

## 2016-10-13 ENCOUNTER — Other Ambulatory Visit (HOSPITAL_COMMUNITY): Payer: Self-pay | Admitting: Internal Medicine

## 2016-10-13 DIAGNOSIS — Z1231 Encounter for screening mammogram for malignant neoplasm of breast: Secondary | ICD-10-CM

## 2016-10-21 NOTE — Patient Outreach (Signed)
[facility]) Care Management  09/19/2016  ALISYN TACURI [birth year] [id number]   75 year old female referred to [facility] for medication assistance. This is a EMMI stroke patient that is on the all payor list. Patient has given verbal authorization to speak with her daughter, Leonette Monarch regarding all of her personal health information. Patient is in the Medicare coverage gap and is having trouble affording Lantus and Humalog which has a copay of $300-400. Patients daughter states they have not asked about samples from patients provider and has not ever applied for low income subsidy/extra help.     Assessment: Medication Assistance: [employer] patient currently in the Medicare coverage gap having difficulty affording Lantus and Humalog with copays of $300-400 each.  Patient lives with daughter but daughter does not support her or claim as dependent.  Patient does receive social security income as her only source of income.  Patient may be eligible for low income subsidy/extra help and also manufacturer patient assistance (if still accepting applications at this time of the year).   Plan: Patients daughter states she can assist with insulin through the rest of the year and will try to obtain samples from patients primary care physician Discussed transferring patients medications to [facility] as 90 day supply of Tier 1 and Tier 2 medications are $0 through mail order.  Completed low income subsidy/extra help application over the phone with assistance of patients daughter (with permission from patient) Will followup results of low income subsidy application in 1 month.   Bennye Alm, PharmD [facility] PGY2 Pharmacy Resident [phone]

## 2016-10-22 ENCOUNTER — Other Ambulatory Visit: Payer: Self-pay | Admitting: Pharmacist

## 2016-10-22 NOTE — Patient Outreach (Signed)
[facility]) Care Management  10/22/2016  ROSHON SELMAN Jul 13, 1941 [id number]   75 year old female referred to [place] for medication assistance.  Called patients daughter today to followup low income subsidy/extra help application.  Patients daughter states patient has been approved for partial extra help.  Patients daughter states that her mother should now be able to afford her medications.    Assessment: Medication Assistance: [employer] patient currently in the Medicare coverage gap having difficulty affording Lantus and Humalog with copays of $300-400 each.  Patient has now been approved for partial extra help.   Plan: [place] will sign off as patient assistance with medications has been approved.  Instructed patient to call [facility] if she has further issues. Please reconsult if needed.  Bennye Alm, PharmD [facility] PGY2 Pharmacy Resident [phone]

## 2016-11-14 DIAGNOSIS — E119 Type 2 diabetes mellitus without complications: Secondary | ICD-10-CM | POA: Diagnosis not present

## 2016-11-17 ENCOUNTER — Other Ambulatory Visit: Payer: Self-pay

## 2016-11-17 ENCOUNTER — Ambulatory Visit: Payer: Medicare Other | Admitting: Hematology

## 2016-11-17 ENCOUNTER — Other Ambulatory Visit (HOSPITAL_BASED_OUTPATIENT_CLINIC_OR_DEPARTMENT_OTHER): Payer: Medicare Other

## 2016-11-17 DIAGNOSIS — D649 Anemia, unspecified: Secondary | ICD-10-CM

## 2016-11-17 DIAGNOSIS — C9110 Chronic lymphocytic leukemia of B-cell type not having achieved remission: Secondary | ICD-10-CM

## 2016-11-17 DIAGNOSIS — E538 Deficiency of other specified B group vitamins: Secondary | ICD-10-CM | POA: Diagnosis not present

## 2016-11-17 DIAGNOSIS — D472 Monoclonal gammopathy: Secondary | ICD-10-CM | POA: Diagnosis not present

## 2016-11-17 LAB — COMPREHENSIVE METABOLIC PANEL
ALBUMIN: 3.6 g/dL (ref 3.5–5.0)
ALK PHOS: 91 U/L (ref 40–150)
ALT: 18 U/L (ref 0–55)
AST: 19 U/L (ref 5–34)
Anion Gap: 13 mEq/L — ABNORMAL HIGH (ref 3–11)
BILIRUBIN TOTAL: 0.32 mg/dL (ref 0.20–1.20)
BUN: 21.6 mg/dL (ref 7.0–26.0)
CALCIUM: 9.6 mg/dL (ref 8.4–10.4)
CO2: 25 mEq/L (ref 22–29)
Chloride: 105 mEq/L (ref 98–109)
Creatinine: 1 mg/dL (ref 0.6–1.1)
EGFR: 58 mL/min/{1.73_m2} — AB (ref >90)
GLUCOSE: 108 mg/dL (ref 70–140)
Potassium: 4.1 mEq/L (ref 3.5–5.1)
SODIUM: 143 meq/L (ref 136–145)
TOTAL PROTEIN: 7.8 g/dL (ref 6.4–8.3)

## 2016-11-17 LAB — TECHNOLOGIST REVIEW

## 2016-11-17 LAB — CBC & DIFF AND RETIC
BASO%: 0.2 % (ref 0.0–2.0)
BASOS ABS: 0 10*3/uL (ref 0.0–0.1)
EOS ABS: 0.2 10*3/uL (ref 0.0–0.5)
EOS%: 1.2 % (ref 0.0–7.0)
HEMATOCRIT: 29.4 % — AB (ref 34.8–46.6)
HEMOGLOBIN: 9.2 g/dL — AB (ref 11.6–15.9)
IMMATURE RETIC FRACT: 15.2 % — AB (ref 1.60–10.00)
LYMPH%: 77.6 % — AB (ref 14.0–49.7)
MCH: 24.7 pg — AB (ref 25.1–34.0)
MCHC: 31.2 g/dL — ABNORMAL LOW (ref 31.5–36.0)
MCV: 79.3 fL — AB (ref 79.5–101.0)
MONO#: 0.1 10*3/uL (ref 0.1–0.9)
MONO%: 0.5 % (ref 0.0–14.0)
NEUT%: 20.5 % — AB (ref 38.4–76.8)
NEUTROS ABS: 3.7 10*3/uL (ref 1.5–6.5)
PLATELETS: 312 10*3/uL (ref 145–400)
RBC: 3.7 10*6/uL (ref 3.70–5.45)
RDW: 16.9 % — ABNORMAL HIGH (ref 11.2–14.5)
Retic %: 1.69 % (ref 0.70–2.10)
Retic Ct Abs: 62.53 10*3/uL (ref 33.70–90.70)
WBC: 18 10*3/uL — ABNORMAL HIGH (ref 3.9–10.3)
lymph#: 13.9 10*3/uL — ABNORMAL HIGH (ref 0.9–3.3)

## 2016-11-17 LAB — LACTATE DEHYDROGENASE: LDH: 232 U/L (ref 125–245)

## 2016-11-17 MED ORDER — CLOPIDOGREL BISULFATE 75 MG PO TABS: 75.0000 mg | ORAL_TABLET | Freq: Every day | ORAL | 0 refills | Status: DC

## 2016-11-18 ENCOUNTER — Telehealth: Payer: Self-pay | Admitting: *Deleted

## 2016-11-18 LAB — KAPPA/LAMBDA LIGHT CHAINS
IG KAPPA FREE LIGHT CHAIN: 17.5 mg/L (ref 3.3–19.4)
IG LAMBDA FREE LIGHT CHAIN: 88.1 mg/L — AB (ref 5.7–26.3)
Kappa/Lambda FluidC Ratio: 0.2 — ABNORMAL LOW (ref 0.26–1.65)

## 2016-11-18 NOTE — Telephone Encounter (Signed)
LMVM for pt to return call.   

## 2016-11-18 NOTE — Telephone Encounter (Signed)
Spoke to daughter of pt.  (ok per DPR) and relayed that no afib, sinus rhythm with rare premature contractions. Faxed to Dr. Willey Blade [phone], ofv [phone].  She verbalized understanding.

## 2016-11-18 NOTE — Telephone Encounter (Signed)
-----   Message from Dennie Bible, NP sent at 11/17/2016 12:01 PM EST ----- Sinus rhythm with rare premature atrial contractions No sustained arrhythmias Atrial fibrillation does not appear to be present though baseline artifact limits interpretation Please call the patient

## 2016-11-20 LAB — MULTIPLE MYELOMA PANEL, SERUM
ALBUMIN/GLOB SERPL: 1 (ref 0.7–1.7)
ALPHA 1: 0.3 g/dL (ref 0.0–0.4)
ALPHA2 GLOB SERPL ELPH-MCNC: 1.2 g/dL — AB (ref 0.4–1.0)
Albumin SerPl Elph-Mcnc: 3.5 g/dL (ref 2.9–4.4)
B-Globulin SerPl Elph-Mcnc: 1.3 g/dL (ref 0.7–1.3)
Gamma Glob SerPl Elph-Mcnc: 0.9 g/dL (ref 0.4–1.8)
Globulin, Total: 3.7 g/dL (ref 2.2–3.9)
IGA/IMMUNOGLOBULIN A, SERUM: 151 mg/dL (ref 64–422)
IGG (IMMUNOGLOBIN G), SERUM: 715 mg/dL (ref 700–1600)
IGM (IMMUNOGLOBIN M), SRM: 341 mg/dL — AB (ref 26–217)
M Protein SerPl Elph-Mcnc: 0.4 g/dL — ABNORMAL HIGH
Total Protein: 7.2 g/dL (ref 6.0–8.5)

## 2016-11-21 ENCOUNTER — Ambulatory Visit (HOSPITAL_COMMUNITY)
Admission: RE | Admit: 2016-11-21 | Discharge: 2016-11-21 | Disposition: A | Discharge status: Home / Self Care | Payer: Medicare Other | Source: Ambulatory Visit | Attending: Internal Medicine | Admitting: Internal Medicine

## 2016-11-21 DIAGNOSIS — Z1231 Encounter for screening mammogram for malignant neoplasm of breast: Secondary | ICD-10-CM | POA: Diagnosis not present

## 2016-11-21 DIAGNOSIS — I10 Essential (primary) hypertension: Secondary | ICD-10-CM | POA: Diagnosis not present

## 2016-11-21 DIAGNOSIS — I69318 Other symptoms and signs involving cognitive functions following cerebral infarction: Secondary | ICD-10-CM | POA: Diagnosis not present

## 2016-11-21 DIAGNOSIS — E1129 Type 2 diabetes mellitus with other diabetic kidney complication: Secondary | ICD-10-CM | POA: Diagnosis not present

## 2016-11-24 ENCOUNTER — Ambulatory Visit (HOSPITAL_BASED_OUTPATIENT_CLINIC_OR_DEPARTMENT_OTHER): Payer: Medicare Other

## 2016-11-24 ENCOUNTER — Encounter: Payer: Self-pay | Admitting: Hematology

## 2016-11-24 ENCOUNTER — Ambulatory Visit (HOSPITAL_BASED_OUTPATIENT_CLINIC_OR_DEPARTMENT_OTHER): Payer: Medicare Other | Admitting: Hematology

## 2016-11-24 ENCOUNTER — Telehealth: Payer: Self-pay | Admitting: Hematology

## 2016-11-24 VITALS — BP 127/47 | HR 79 | Temp 98.2°F | Resp 18 | Ht 64.0 in | Wt 225.3 lb

## 2016-11-24 DIAGNOSIS — D472 Monoclonal gammopathy: Secondary | ICD-10-CM | POA: Diagnosis not present

## 2016-11-24 DIAGNOSIS — C9110 Chronic lymphocytic leukemia of B-cell type not having achieved remission: Secondary | ICD-10-CM | POA: Diagnosis not present

## 2016-11-24 DIAGNOSIS — E538 Deficiency of other specified B group vitamins: Secondary | ICD-10-CM

## 2016-11-24 DIAGNOSIS — D509 Iron deficiency anemia, unspecified: Secondary | ICD-10-CM | POA: Diagnosis not present

## 2016-11-24 DIAGNOSIS — D649 Anemia, unspecified: Secondary | ICD-10-CM

## 2016-11-24 LAB — CBC & DIFF AND RETIC
BASO%: 0.3 % (ref 0.0–2.0)
Basophils Absolute: 0.1 10*3/uL (ref 0.0–0.1)
EOS ABS: 0.2 10*3/uL (ref 0.0–0.5)
EOS%: 0.9 % (ref 0.0–7.0)
HEMATOCRIT: 29.3 % — AB (ref 34.8–46.6)
HEMOGLOBIN: 9.4 g/dL — AB (ref 11.6–15.9)
Immature Retic Fract: 10.4 % — ABNORMAL HIGH (ref 1.60–10.00)
LYMPH%: 77.7 % — AB (ref 14.0–49.7)
MCH: 25.2 pg (ref 25.1–34.0)
MCHC: 32.1 g/dL (ref 31.5–36.0)
MCV: 78.6 fL — AB (ref 79.5–101.0)
MONO#: 0.1 10*3/uL (ref 0.1–0.9)
MONO%: 0.4 % (ref 0.0–14.0)
NEUT%: 20.7 % — ABNORMAL LOW (ref 38.4–76.8)
NEUTROS ABS: 3.7 10*3/uL (ref 1.5–6.5)
Platelets: 330 10*3/uL (ref 145–400)
RBC: 3.73 10*6/uL (ref 3.70–5.45)
RDW: 16.8 % — ABNORMAL HIGH (ref 11.2–14.5)
Retic %: 1.5 % (ref 0.70–2.10)
Retic Ct Abs: 55.95 10*3/uL (ref 33.70–90.70)
WBC: 18.1 10*3/uL — AB (ref 3.9–10.3)
lymph#: 14 10*3/uL — ABNORMAL HIGH (ref 0.9–3.3)

## 2016-11-24 LAB — COMPREHENSIVE METABOLIC PANEL
ALT: 15 U/L (ref 0–55)
AST: 14 U/L (ref 5–34)
Albumin: 3.5 g/dL (ref 3.5–5.0)
Alkaline Phosphatase: 90 U/L (ref 40–150)
Anion Gap: 12 mEq/L — ABNORMAL HIGH (ref 3–11)
BILIRUBIN TOTAL: 0.34 mg/dL (ref 0.20–1.20)
BUN: 20.4 mg/dL (ref 7.0–26.0)
CO2: 23 meq/L (ref 22–29)
CREATININE: 1.4 mg/dL — AB (ref 0.6–1.1)
Calcium: 9.2 mg/dL (ref 8.4–10.4)
Chloride: 105 mEq/L (ref 98–109)
EGFR: 37 mL/min/{1.73_m2} — ABNORMAL LOW (ref >90)
Glucose: 228 mg/dl — ABNORMAL HIGH (ref 70–140)
Potassium: 4.4 mEq/L (ref 3.5–5.1)
SODIUM: 140 meq/L (ref 136–145)
TOTAL PROTEIN: 7.8 g/dL (ref 6.4–8.3)

## 2016-11-24 LAB — IRON AND TIBC
%SAT: 8 % — AB (ref 21–57)
Iron: 33 ug/dL — ABNORMAL LOW (ref 41–142)
TIBC: 413 ug/dL (ref 236–444)
UIBC: 379 ug/dL (ref 120–384)

## 2016-11-24 LAB — TECHNOLOGIST REVIEW

## 2016-11-24 LAB — LACTATE DEHYDROGENASE: LDH: 215 U/L (ref 125–245)

## 2016-11-24 LAB — FERRITIN: FERRITIN: 26 ng/mL (ref 9–269)

## 2016-11-24 NOTE — Telephone Encounter (Signed)
Per 1/22 LOS. Patient given AVS report and calendars with future scheduled appointments.

## 2016-11-24 NOTE — Progress Notes (Signed)
Marland Kitchen    HEMATOLOGY/ONCOLOGY CLINIC NOTE  Date of Service: .11/24/2016  Patient Care Team: Asencion Noble, MD as PCP - General  CHIEF COMPLAINTS/PURPOSE OF CONSULTATION:  Monoclonal B- lymphoproliferative disorder   HISTORY OF PRESENTING ILLNESS:  Theresa Kramer is a wonderful 76 y.o. female who has been referred to [place] by Dr Asencion Noble, MD for evaluation and management of lymphocytosis.  Patient has a h/o B12 def, depression, arthritis and DDD and was recently admitted to the hospital with TIA vs delirium in the setting of UTI. 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 She is here for further evaluation of her newly diagnosed B-cell leukemia/lymphoma. Patient reports no fevers. Has some night sweats but those have been in the setting of hypoglycemia. No overt LNadenopathy. No abdominal pain or distension. No reported weight loss. Has chronic fatigue that hasnt significantly changed recently.   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 Her daughter accompanies her. Patient notes no new constitutional symptoms. Her hemoglobin is down from 10.7 to 9.2 with development of microcytic indices. She notes no overt GI or other blood loss. 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 No fevers/chills/nightsweats. We discussed that we would like to get iron labs today to workup her anemia. Patient is on Plavix and SSRI that could increase her risk of GI losses.  If this does not explain the cause of her anemia she might possibly require a bone marrow examination.  MEDICAL HISTORY:  Past Medical History:  Diagnosis Date  . Arthritis   . B12 deficiency   . Bronchitis    hx of  . Complication of anesthesia   . Depression    "not taking medication"  . Diabetes mellitus   . GERD  (gastroesophageal reflux disease)   . H/O hiatal hernia   . Hyperlipidemia   . Hypertension   . Noncompliance with medication regimen   . Pinched cervical nerve root   . PONV (postoperative nausea and vomiting)   . Urinary tract infection    hx of    SURGICAL HISTORY: Past Surgical History:  Procedure Laterality Date  . ABDOMINAL HYSTERECTOMY    . ANTERIOR CERVICAL DECOMP/DISCECTOMY FUSION  03/15/2012   Procedure: ANTERIOR CERVICAL DECOMPRESSION/DISCECTOMY FUSION 2 LEVELS;  Surgeon: Ophelia Charter, MD;  Location: [facility];  Service: Neurosurgery;  Laterality: N/A;  Cervical four-five Cervical five six anterior cervical decompression with fusion interbody prothesis plating and bonegraft  . APPENDECTOMY    . BONE CYST EXCISION     from lower back and foot  . CHOLECYSTECTOMY    . COLONOSCOPY  2011   Dr. Gala Romney: prep compromised exam. diverticulosis, hemorrhoids. next tcs 2021  . DILATION AND CURETTAGE OF UTERUS    . 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 Multiple distal rings dilated and disrupted as described above. Small hiatal hernia.   Marland Kitchen EXCISION METACARPAL MASS Left 03/03/2016   Procedure: EXCISION OF LEFT  DORSAL MASS OF EXTENSOR TENDON;  Surgeon: Milly Jakob, MD;  Location: [facility];  Service: Orthopedics;  Laterality: Left;  . EYE SURGERY     bilateral cataract surgery,   . HIP ARTHROPLASTY     right  . JOINT REPLACEMENT    . KNEE SURGERY     torn  cartilage repair  . MALONEY DILATION N/A 09/19/2014   Procedure: Venia Minks DILATION;  Surgeon: Daneil Dolin, MD;  [facility];  Service: Endoscopy;  Laterality: N/A;  . ORTHOPEDIC SURGERY    . ROTATOR CUFF REPAIR     "multiple in both shoulders" and total shoulder replacement in left arm  . SAVORY DILATION N/A 09/19/2014   Procedure: SAVORY DILATION;  Surgeon: Daneil Dolin, MD;  Location: [facility];  Service:  Endoscopy;  Laterality: N/A;  . TONSILLECTOMY      SOCIAL HISTORY: Social History   Social History  . Marital status: Widowed    Spouse name: N/A  . Number of children: N/A  . Years of education: N/A   Occupational History  . Not on file.   Social History Main Topics  . Smoking status: Former Research scientist (life sciences)  . Smokeless tobacco: Never Used     Comment: stopped 25 years ago  . Alcohol use No  . Drug use: No  . Sexual activity: Not on file   Other Topics Concern  . Not on file   Social History Narrative  . No narrative on file    FAMILY HISTORY: Family History  Problem Relation Age of Onset  . Cirrhosis Brother     etoh  . Colon cancer Neg Hx     ALLERGIES:  is allergic to meloxicam.  MEDICATIONS:  Current Outpatient Prescriptions  Medication Sig Dispense Refill  . acetaminophen (TYLENOL) 650 MG CR tablet Take 1,300 mg by mouth 2 (two) times daily.    Marland Kitchen amLODipine (NORVASC) 5 MG tablet Take 5 mg by mouth daily.    . benzonatate (TESSALON) 200 MG capsule Take 200 mg by mouth 3 (three) times daily as needed for cough.    . citalopram (CELEXA) 20 MG tablet Take 20 mg by mouth daily.      . clopidogrel (PLAVIX) 75 MG tablet Take 1 tablet (75 mg total) by mouth daily. 90 tablet 0  . clotrimazole (LOTRIMIN) 1 % cream Apply 1 application topically 2 (two) times daily as needed. For rash     . cyanocobalamin (,VITAMIN B-12,) 1000 MCG/ML injection Inject 1,000 mcg into the muscle every 30 (thirty) days. Usually on the 1st     . gemfibrozil (LOPID) 600 MG tablet Take 600 mg by mouth 2 (two) times daily before a meal.      . glipiZIDE (GLUCOTROL XL) 5 MG 24 hr tablet Take 5 mg by mouth daily.      Marland Kitchen HYDROcodone-homatropine (HYCODAN) 5-1.5 MG/5ML syrup Take 5 mLs by mouth every 4 (four) hours as needed for cough.    . insulin glargine (LANTUS) 100 UNIT/ML injection Inject 65 Units into the skin at bedtime.     . insulin lispro (HUMALOG) 100 UNIT/ML injection Inject 12 Units into the  skin 3 (three) times daily before meals.     Marland Kitchen losartan-hydrochlorothiazide (HYZAAR) 100-12.5 MG tablet Take 1 tablet by mouth daily.    . metFORMIN (GLUCOPHAGE) 1000 MG tablet Take 1,000 mg by mouth 2 (two) times daily with a meal.      . oxybutynin (DITROPAN-XL) 10 MG 24 hr tablet Take 10 mg by mouth daily.     . pantoprazole (PROTONIX) 40 MG tablet Take 40 mg by mouth daily.    . pramipexole (MIRAPEX) 0.25 MG tablet Take 0.25 mg by mouth daily.      . pravastatin (PRAVACHOL) 80 MG tablet Take 1 tablet (80 mg total) by mouth daily. 30 tablet  3  . ranitidine (ZANTAC) 150 MG tablet Take 150 mg by mouth at bedtime.    . risperiDONE (RISPERDAL) 0.25 MG tablet Take 0.25 mg by mouth 2 (two) times daily.  3  . traMADol (ULTRAM) 50 MG tablet Take 1 tablet (50 mg total) by mouth every 6 (six) hours as needed for moderate pain. 20 tablet 0  . traZODone (DESYREL) 50 MG tablet TAKE 1/2 - 1 TABLET BY MOUTH AT BEDTIME AS NEEDED  1   No current facility-administered medications for this visit.     REVIEW OF SYSTEMS:    10 Point review of Systems was done is negative except as noted above.  PHYSICAL EXAMINATION: ECOG PERFORMANCE STATUS: 2 - Symptomatic, <50% confined to bed  . Vitals:   11/24/16 0913  BP: (!) 127/47  Pulse: 79  Resp: 18  Temp: 98.2 F (36.8 C)   Filed Weights   11/24/16 0913  Weight: 225 lb 4.8 oz (102.2 kg)   .Body mass index is 38.67 kg/m.  GENERAL:alert, in no acute distress and comfortable SKIN: skin color, texture, turgor are normal, no rashes or significant lesions EYES: normal, conjunctiva are pink and non-injected, sclera clear OROPHARYNX:no exudate, no erythema and lips, buccal mucosa, and tongue normal  NECK: supple, no JVD, thyroid normal size, non-tender, without nodularity LYMPH:  no palpable lymphadenopathy in the cervical, axillary or inguinal LUNGS: clear to auscultation with normal respiratory effort HEART: regular rate & rhythm,  no murmurs and no  lower extremity edema ABDOMEN: abdomen soft, non-tender, normoactive bowel sounds , no palpable hepatosplenomegaly. Musculoskeletal: no cyanosis of digits and no clubbing  PSYCH: alert & oriented x 3 with fluent speech NEURO: no focal motor/sensory deficits  LABORATORY DATA:  I have reviewed the data as listed  . CBC Latest Ref Rng & Units 11/17/2016 08/27/2016 08/04/2016  WBC 3.9 - 10.3 10e3/uL 18.0(H) 21.7(H) 24.4(H)  Hemoglobin 11.6 - 15.9 g/dL 9.2(L) 10.7(L) 10.6(L)  Hematocrit 34.8 - 46.6 % 29.4(L) 32.8(L) 34.8(L)  Platelets 145 - 400 10e3/uL 312 370 321   . CBC    Component Value Date/Time   WBC 18.0 (H) 11/17/2016 0743   WBC 24.4 (H) 08/04/2016 0641   RBC 3.70 11/17/2016 0743   RBC 4.16 08/04/2016 0641   HGB 9.2 (L) 11/17/2016 0743   HCT 29.4 (L) 11/17/2016 0743   PLT 312 11/17/2016 0743   MCV 79.3 (L) 11/17/2016 0743   MCH 24.7 (L) 11/17/2016 0743   MCH 25.5 (L) 08/04/2016 0641   MCHC 31.2 (L) 11/17/2016 0743   MCHC 30.5 08/04/2016 0641   RDW 16.9 (H) 11/17/2016 0743   LYMPHSABS 13.9 (H) 11/17/2016 0743   MONOABS 0.1 11/17/2016 0743   EOSABS 0.2 11/17/2016 0743   BASOSABS 0.0 11/17/2016 0743     . CMP Latest Ref Rng & Units 11/17/2016 11/17/2016 08/27/2016  Glucose 70 - 140 mg/dl 108 - 47(L)  BUN 7.0 - 26.0 mg/dL 21.6 - 19.6  Creatinine 0.6 - 1.1 mg/dL 1.0 - 0.8  Sodium 136 - 145 mEq/L 143 - 143  Potassium 3.5 - 5.1 mEq/L 4.1 - 3.7  Chloride 101 - 111 mmol/L - - -  CO2 22 - 29 mEq/L 25 - 27  Calcium 8.4 - 10.4 mg/dL 9.6 - 9.6  Total Protein 6.0 - 8.5 g/dL 7.8 7.2 7.8  Total Bilirubin 0.20 - 1.20 mg/dL 0.32 - 0.31  Alkaline Phos 40 - 150 U/L 91 - 103  AST 5 - 34 U/L 19 - 19  ALT 0 -  55 U/L 18 - 25   . Lab Results  Component Value Date   LDH 232 11/17/2016          RADIOGRAPHIC STUDIES: I have personally reviewed the radiological images as listed and agreed with the findings in the report. No results found.  PET/CT 10/25/017:   IMPRESSION: 1. Mildly enlarged and mildly hypermetabolic spleen, consistent with the provided history of a lymphoproliferative disorder. 2. No hypermetabolic lymphadenopathy or other hypermetabolic sites of disease. 3. Additional findings include aortic atherosclerosis, three-vessel coronary atherosclerosis, small hiatal hernia and small fat containing left inguinal hernia.   Electronically Signed   By: Ilona Sorrel M.D.   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 Noted to have elevated wbc/lymphocyte counts end of September. Unknown if she had a more chronic elevation of her WBC counts that might put a timeline on her lymphocytosis.  Differential diagnosis- CD5 neg variant of CLL (about 17-20% patients with CLL could be CD5neg) vs B-PLL vs other B-cell leukemia/lymphoma  PET/CT with no significant LNadenopathy and PET/CT with minimal enlarged and minimally hypermetabolic. FISH shows Trisomy 73 which would be consistent with atypical CLL LDH WNL  2) Anemia - now microcytic with increased RDW ? Related to iron deficiency vs ? Related to lymphoproliferative process.   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 Brings up the possibility of lymphoplasmacytic lymphoma however trisomy 12 is typical for CLL and uncommon in LPL/WM PLAN -We discussed the fact that patient appears to be more anemic and with microcytic and ices would need to rule out iron deficiency . -We'll get labs with CBC, ferritin, iron profile and B12 levels today  -LDH is within normal limits and makes hemolytic anemia unlikely. -If this does not explain the patient's anemia might need to consider a bone marrow examination .  4). Patient Active Problem List   Diagnosis Date Noted  . Occipital neuralgia   . Acute ischemic  stroke ([place])   . Stroke ([place])   . 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 Leukocytosis 08/01/2016  . Urinary tract infectious disease 08/01/2016  . Type 2 diabetes mellitus without complication, with long-term current use of insulin ([place]) 07/30/2016  . Essential hypertension 07/30/2016  . Osteoarthritis 07/30/2016  . Normocytic anemia 07/30/2016  . HLD (hyperlipidemia)   . Schatzki's ring   . Cervical herniated disc 03/15/2012  . GERD 04/29/2010  . CHEST PAIN, ATYPICAL 04/29/2010  . DYSPHAGIA UNSPECIFIED 04/29/2010  -continue rf/u with PCP.   Labs today RTC with Dr Irene Limbo in 4-6 weeks with rpt labs  All of the patients questions were answered with apparent satisfaction. The patient knows to call the clinic with any problems, questions or concerns.   I spent 20 minutes counseling the patient face to face. The total time spent in the appointment was 25 minutes and more than 50% was on counseling and direct patient cares.    Sullivan Lone MD [place] AAHIVMS [facility] Hematology/Oncology Physician [facility]  (Office):       [phone] (Work cell):  [phone] (Fax):           [phone]

## 2016-11-26 LAB — VITAMIN B12: VITAMIN B 12: 573 pg/mL (ref 232–1245)

## 2016-12-31 ENCOUNTER — Encounter: Payer: Self-pay | Admitting: Nurse Practitioner

## 2016-12-31 ENCOUNTER — Ambulatory Visit (INDEPENDENT_AMBULATORY_CARE_PROVIDER_SITE_OTHER): Payer: Medicare Other | Admitting: Nurse Practitioner

## 2016-12-31 VITALS — BP 140/80 | HR 88 | Resp 20 | Ht 64.0 in | Wt 225.0 lb

## 2016-12-31 DIAGNOSIS — I10 Essential (primary) hypertension: Secondary | ICD-10-CM | POA: Diagnosis not present

## 2016-12-31 DIAGNOSIS — E785 Hyperlipidemia, unspecified: Secondary | ICD-10-CM

## 2016-12-31 DIAGNOSIS — I639 Cerebral infarction, unspecified: Secondary | ICD-10-CM | POA: Diagnosis not present

## 2016-12-31 DIAGNOSIS — R400 Somnolence: Secondary | ICD-10-CM | POA: Insufficient documentation

## 2016-12-31 DIAGNOSIS — R0683 Snoring: Secondary | ICD-10-CM

## 2016-12-31 NOTE — Progress Notes (Signed)
[facility]  PATIENT: Theresa Kramer DOB: 1941/09/06   REASON FOR VISIT: follow-up for stroke, new complaint daytime drowsiness HISTORY FROM: Patient and daughter [name]    HISTORY OF PRESENT ILLNESS:UPDATE 02/28/2018[name], 76 year old female returns for follow-up with history of stroke in September 2017. She is currently on Plavix for secondary stroke prevention without recurrent stroke or TIA symptoms. She has minimal bruising and bleeding noted. Blood pressure in the office today 140/80. Daughter reports most recent hemoglobin A1c was around 7. She does not always follow her diabetic diet. She remains on Pravachol for hyperlipidemia. She denies any muscle aches. She gets no exercise and was encouraged to do so. She has a lot of fatigue. She has been evaluated by hematology for possible CLL. According to her daughter the next step is bone marrow biopsy. She also has an anemia. She has had quite a bit of anxiety since her hospitalization and is currently on Celexa, Ativan and trazodone for sleep which is helpful . She has a new complaint today of daytime drowsiness, according to the daughter she does snore at night she is not sure if she has any apnea events. 30 day event monitor after last visit was negative for atrial fibrillation. She returns for reevaluation   HISTORY 09/22/16 CMEvelyn H Brameis a 76 y.o.femalewith a past medical history significant for HTN, IDDMwho presented with left sided weakness on 08/02/16.She was also admitted last week with nonspecific abnormal behaviors thought initially to be related to TIA versus delirium from some underlying neurologic stressor. MRI/MRA were obtained which showed some punctate findings in the right ACA distribution, ultimately thought by stroke team not to be infarction, andthe patient was treated for a UTI, and hydrated. Echocardiogram was obtained that was normal. MRA showed an acute occlusion of the RIGHT A2  segment. 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 Patient still complained of Urinary Symptoms so she was treated initially with po Ciprofloxacin but switched to IV Ceftriaxone to complete her Abx Course. Because of her immunocompromised state she was also treated for the yeast in her urine. Throughout the course of the hospitalization the patient progressed and PT/OT recommended Home Physical Therapy and Occupational Therapy. Patient was also noticed to have an elevated Leukocyte Count with Atypical Mononuclear Cells and Lymphocytes and Heme/Onc was called and they recommended Flow Cytometry to evaluate for CLL and follow up as an outpatient. Patient at this time was deemed medically stable to be D/C'd Home and is to follow up with PCP, Hematology/Oncology, and Neurology as an outpatient. She follows up in the stroke clinic today for hospital follow-up. 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 Carotid Doppler was unremarkable 2-D echo EF 60-65%. 30 day event monitoring is suggested we set up. LDL 122 hemoglobin A1c 8.3. She is currently on Plavix without further stroke or TIA symptoms. Minimal bruising or bleeding she has had several adjustments to her insulin dose since discharge. Her blood pressures are well controlled. Her therapies concluded last week however she is not doing a home exercise program. She has trazodone to take for sleep and risperidone for anxiety. She returns for reevaluation. She has follow-up with hematology this afternoon   REVIEW OF SYSTEMS: Full 14 system review of systems performed and notable only for those listed, all others are neg:  Constitutional: Fatigue  Cardiovascular: neg Ear/Nose/Throat: neg  Skin: neg  Eyes: neg Respiratory: neg Gastroitestinal: neg  Hematology/Lymphatic: Easy bruising Endocrine: Intolerance to cold and  heat Musculoskeletal: neg Allergy/Immunology: neg Neurological: Mild cognitive impairment Psychiatric: Depression and anxiety Sleep : Daytime drowsiness   ALLERGIES: Allergies  Allergen Reactions  . Meloxicam Other (See Comments)    Stomach pains    HOME MEDICATIONS: Outpatient Medications Prior to Visit  Medication Sig Dispense Refill  . acetaminophen (TYLENOL) 650 MG CR tablet Take 1,300 mg by mouth 2 (two) times daily.    Marland Kitchen amLODipine (NORVASC) 5 MG tablet Take 5 mg by mouth daily.    . benzonatate (TESSALON) 200 MG capsule Take 200 mg by mouth 3 (three) times daily as needed for cough.    . citalopram (CELEXA) 20 MG tablet Take 20 mg by mouth daily.      . clopidogrel (PLAVIX) 75 MG tablet Take 1 tablet (75 mg total) by mouth daily. 90 tablet 0  . clotrimazole (LOTRIMIN) 1 % cream Apply 1 application topically 2 (two) times daily as needed. For rash     . cyanocobalamin (,VITAMIN B-12,) 1000 MCG/ML injection Inject 1,000 mcg into the muscle every 30 (thirty) days. Usually on the 1st     . gemfibrozil (LOPID) 600 MG tablet Take 600 mg by mouth 2 (two) times daily before a meal.      . glipiZIDE (GLUCOTROL XL) 5 MG 24 hr tablet Take 5 mg by mouth daily.      Marland Kitchen HYDROcodone-homatropine (HYCODAN) 5-1.5 MG/5ML syrup Take 5 mLs by mouth every 4 (four) hours as needed for cough.    . insulin glargine (LANTUS) 100 UNIT/ML injection Inject 65 Units into the skin at bedtime.     . insulin lispro (HUMALOG) 100 UNIT/ML injection Inject 12 Units into the skin 3 (three) times daily before meals.     Marland Kitchen losartan-hydrochlorothiazide (HYZAAR) 100-12.5 MG tablet Take 1 tablet by mouth daily.    . metFORMIN (GLUCOPHAGE) 1000 MG tablet Take 1,000 mg by mouth 2 (two) times daily with a meal.      . oxybutynin (DITROPAN-XL) 10 MG 24 hr tablet Take 10 mg by mouth daily.     . pantoprazole (PROTONIX) 40 MG tablet Take 40 mg by mouth daily.    . pramipexole (MIRAPEX) 0.25 MG tablet Take 0.25 mg by mouth  daily.      . pravastatin (PRAVACHOL) 80 MG tablet Take 1 tablet (80 mg total) by mouth daily. 30 tablet 3  . ranitidine (ZANTAC) 150 MG tablet Take 150 mg by mouth at bedtime.    . risperiDONE (RISPERDAL) 0.25 MG tablet Take 0.5 mg by mouth 2 (two) times daily.   3  . traMADol (ULTRAM) 50 MG tablet Take 1 tablet (50 mg total) by mouth every 6 (six) hours as needed for moderate pain. 20 tablet 0  . traZODone (DESYREL) 50 MG tablet TAKE 1/2 - 1 TABLET BY MOUTH AT BEDTIME AS NEEDED  1   No facility-administered medications prior to visit.     PAST MEDICAL HISTORY: Past Medical History:  Diagnosis Date  . Arthritis   . B12 deficiency   . Bronchitis    hx of  . Complication of anesthesia   . Depression    "not taking medication"  . Diabetes mellitus   . GERD (gastroesophageal reflux disease)   . H/O hiatal hernia   . Hyperlipidemia   . Hypertension   . Noncompliance with medication regimen   . Pinched cervical nerve root   . PONV (postoperative nausea  and vomiting)   . Urinary tract infection    hx of    PAST SURGICAL HISTORY: Past Surgical History:  Procedure Laterality Date  . ABDOMINAL HYSTERECTOMY    . ANTERIOR CERVICAL DECOMP/DISCECTOMY FUSION  03/15/2012   Procedure: ANTERIOR CERVICAL DECOMPRESSION/DISCECTOMY FUSION 2 LEVELS;  Surgeon: Ophelia Charter, MD;  Location: [facility];  Service: Neurosurgery;  Laterality: N/A;  Cervical four-five Cervical five six anterior cervical decompression with fusion interbody prothesis plating and bonegraft  . APPENDECTOMY    . BONE CYST EXCISION     from lower back and foot  . CHOLECYSTECTOMY    . COLONOSCOPY  2011   Dr. Gala Romney: prep compromised exam. diverticulosis, hemorrhoids. next tcs 2021  . DILATION AND CURETTAGE OF UTERUS    . 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 Multiple distal rings dilated and disrupted as described above. Small hiatal hernia.    Marland Kitchen EXCISION METACARPAL MASS Left 03/03/2016   Procedure: EXCISION OF LEFT  DORSAL MASS OF EXTENSOR TENDON;  Surgeon: Milly Jakob, MD;  Location: [place];  Service: Orthopedics;  Laterality: Left;  . EYE SURGERY     bilateral cataract surgery,   . HIP ARTHROPLASTY     right  . JOINT REPLACEMENT    . KNEE SURGERY     torn cartilage repair  . MALONEY DILATION N/A 09/19/2014   Procedure: Venia Minks DILATION;  Surgeon: Daneil Dolin, MD;  [facility];  Service: Endoscopy;  Laterality: N/A;  . ORTHOPEDIC SURGERY    . ROTATOR CUFF REPAIR     "multiple in both shoulders" and total shoulder replacement in left arm  . SAVORY DILATION N/A 09/19/2014   Procedure: SAVORY DILATION;  Surgeon: Daneil Dolin, MD;  Location: [facility];  Service: Endoscopy;  Laterality: N/A;  . TONSILLECTOMY      FAMILY HISTORY: Family History  Problem Relation Age of Onset  . Cirrhosis Brother     etoh  . Colon cancer Neg Hx     SOCIAL HISTORY: Social History   Social History  . Marital status: Widowed    Spouse name: N/A  . Number of children: N/A  . Years of education: N/A   Occupational History  . Not on file.   Social History Main Topics  . Smoking status: Former Research scientist (life sciences)  . Smokeless tobacco: Never Used     Comment: stopped 25 years ago  . Alcohol use No  . Drug use: No  . Sexual activity: Not on file   Other Topics Concern  . Not on file   Social History Narrative  . No narrative on file     PHYSICAL EXAM  Vitals:   12/31/16 0755  BP: 140/80  Pulse: 88  Resp: 20  Weight: 225 lb (102.1 kg)  Height: 5' 4"  (1.626 m)   Body mass index is 38.62 kg/m.  Generalized: Well developed,Obese female in  no acute distress  Head: normocephalic and atraumatic,. Oropharynx benign  Neck: Supple, no carotid bruits  Cardiac: Regular rate rhythm, no murmur  Musculoskeletal: No deformity   Neurological examination   Mentation: Alert oriented to time, place,  history taking. Attention span and concentration appropriate.  Follows all commands speech and language fluent.   Cranial nerve II-XII: Fundoscopic exam not done. Pupils were equal round reactive to light extraocular movements were full, visual field were full on confrontational test. Facial sensation and strength were normal. hearing was intact to finger  rubbing bilaterally. Uvula tongue midline. head turning and shoulder shrug were normal and symmetric.Tongue protrusion into cheek strength was normal. Motor: normal bulk and tone, full strength in the BUE, BLE,  except left foot 4 out of 5 DF/PF fine finger movements normal,.  Sensory: normal and symmetric to light touch, pinprick, and  Vibration, in the upper and lower extremities  Coordination: finger-nose-finger, heel-to-shin bilaterally, no dysmetria Reflexes: 1+ upper lower and symmetric plantar responses were flexor bilaterally. Gait and Station: Rising up from seated position without assistance, normal stance,  moderate stride, good arm swing, smooth turning, able to perform tiptoe, and heel walking without difficulty. Tandem gait is steady. No assistive device  DIAGNOSTIC DATA (LABS, IMAGING, TESTING) - I reviewed patient records, labs, notes, testing and imaging myself where available.  Lab Results  Component Value Date   WBC 18.1 (H) 11/24/2016   HGB 9.4 (L) 11/24/2016   HCT 29.3 (L) 11/24/2016   MCV 78.6 (L) 11/24/2016   PLT 330 11/24/2016      Component Value Date/Time   NA 140 11/24/2016 1035   K 4.4 11/24/2016 1035   CL 98 (L) 08/04/2016 0641   CO2 23 11/24/2016 1035   GLUCOSE 228 (H) 11/24/2016 1035   BUN 20.4 11/24/2016 1035   CREATININE 1.4 (H) 11/24/2016 1035   CALCIUM 9.2 11/24/2016 1035   PROT 7.8 11/24/2016 1035   ALBUMIN 3.5 11/24/2016 1035   AST 14 11/24/2016 1035   ALT 15 11/24/2016 1035   ALKPHOS 90 11/24/2016 1035   BILITOT 0.34 11/24/2016 1035   GFRNONAA >60 08/04/2016 0641   GFRAA >60 08/04/2016 0641     Lab Results  Component Value Date   CHOL 202 (H) 07/30/2016   HDL 28 (L) 07/30/2016   LDLCALC 122 (H) 07/30/2016   TRIG 259 (H) 07/30/2016   CHOLHDL 7.2 07/30/2016   Lab Results  Component Value Date   HGBA1C 8.3 (H) 07/30/2016    ASSESSMENT AND PLAN  76 y.o. year old female  has a past medical history of Arthritis; B12 deficiency;  Diabetes mellitus; Hyperlipidemia; Hypertension;  right ACA territory infarct . She is here for Stroke  follow-up. The patient is a current patient of Dr. Erlinda Hong  who is out of the office today . This note is sent to the work in doctor. She has new complaint today of daytime drowsiness and snoring.   PLAN: Stressed the importance of continued management of risk factors to prevent further stroke Continue Plavix for secondary stroke prevention Maintain strict control of hypertension with blood pressure goal below 130/90, today's reading 140/80 continue antihypertensive medications Control of diabetes with hemoglobin A1c below 6.5 followed by primary care most recent hemoglobin A1c around 7 continue diabetic medications Cholesterol with LDL cholesterol less than 70, continue statin drugs lipids followed by PCP Exercise by walking, , eat healthy diet with whole grains,  fresh fruits and vegetables Continue cognitive activities include cross words strategy games, exercise 30 day event monitor without atrial fib For her snoring and daytime drowsiness will obtain sleep study  I explained in particular the risks and ramifications of untreated moderate to severe OSA, especially with respect to cardiovascular disease  including congestive heart failure, difficult to treat hypertension, cardiac arrhythmias, or stroke. Even type 2 diabetes has, in part, been linked to untreated OSA. Symptoms of untreated OSA include daytime sleepiness, memory problems, mood irritability and mood disorder such as depression and anxiety, lack of energy. F/U in 6 months This was a visit  requiring 25 minutes of medical decision making  with extensive review of history,  counseling and answering questions for the patient and daughter regarding possible obstructive sleep apnea and its risk factor for  stroke Dennie Bible, [facility], [facility], APRN  [facility] [address] [place], [place] [address] [phone]

## 2016-12-31 NOTE — Patient Instructions (Signed)
Continue Plavix for secondary stroke prevention Maintain strict control of hypertension with blood pressure goal below 130/90, today's reading 140/80 continue antihypertensive medications Control of diabetes with hemoglobin A1c below 6.5 followed by primary care most recent hemoglobin A1c around 7 continue diabetic medications Cholesterol with LDL cholesterol less than 70, continue statin drugs Exercise by walking, home exercise program slowly increase , eat healthy diet with whole grains,  fresh fruits and vegetables Continue cognitive activities include cross words strategy games, exercise 30 day event monitor without atrial fib For her snoring and daytime drowsiness will obtain sleep study  F/U in 6 months

## 2017-01-02 ENCOUNTER — Other Ambulatory Visit: Payer: Self-pay | Admitting: *Deleted

## 2017-01-02 DIAGNOSIS — D72829 Elevated white blood cell count, unspecified: Secondary | ICD-10-CM

## 2017-01-02 DIAGNOSIS — D649 Anemia, unspecified: Secondary | ICD-10-CM

## 2017-01-05 ENCOUNTER — Other Ambulatory Visit (HOSPITAL_BASED_OUTPATIENT_CLINIC_OR_DEPARTMENT_OTHER): Payer: Medicare Other

## 2017-01-05 ENCOUNTER — Encounter: Payer: Self-pay | Admitting: Hematology

## 2017-01-05 ENCOUNTER — Ambulatory Visit (HOSPITAL_BASED_OUTPATIENT_CLINIC_OR_DEPARTMENT_OTHER): Payer: Medicare Other | Admitting: Hematology

## 2017-01-05 ENCOUNTER — Telehealth: Payer: Self-pay | Admitting: Hematology

## 2017-01-05 VITALS — BP 156/66 | HR 73 | Temp 98.6°F | Resp 20 | Wt 223.3 lb

## 2017-01-05 DIAGNOSIS — D72820 Lymphocytosis (symptomatic): Secondary | ICD-10-CM

## 2017-01-05 DIAGNOSIS — D72829 Elevated white blood cell count, unspecified: Secondary | ICD-10-CM

## 2017-01-05 DIAGNOSIS — D472 Monoclonal gammopathy: Secondary | ICD-10-CM

## 2017-01-05 DIAGNOSIS — D509 Iron deficiency anemia, unspecified: Secondary | ICD-10-CM

## 2017-01-05 DIAGNOSIS — D649 Anemia, unspecified: Secondary | ICD-10-CM | POA: Diagnosis not present

## 2017-01-05 DIAGNOSIS — E538 Deficiency of other specified B group vitamins: Secondary | ICD-10-CM

## 2017-01-05 DIAGNOSIS — C9110 Chronic lymphocytic leukemia of B-cell type not having achieved remission: Secondary | ICD-10-CM

## 2017-01-05 DIAGNOSIS — D500 Iron deficiency anemia secondary to blood loss (chronic): Secondary | ICD-10-CM

## 2017-01-05 LAB — COMPREHENSIVE METABOLIC PANEL
ALBUMIN: 3.5 g/dL (ref 3.5–5.0)
ALK PHOS: 79 U/L (ref 40–150)
ALT: 14 U/L (ref 0–55)
AST: 14 U/L (ref 5–34)
Anion Gap: 11 mEq/L (ref 3–11)
BUN: 30 mg/dL — ABNORMAL HIGH (ref 7.0–26.0)
CO2: 22 meq/L (ref 22–29)
Calcium: 9.4 mg/dL (ref 8.4–10.4)
Chloride: 107 mEq/L (ref 98–109)
Creatinine: 1.2 mg/dL — ABNORMAL HIGH (ref 0.6–1.1)
EGFR: 43 mL/min/{1.73_m2} — AB (ref >90)
Glucose: 131 mg/dl (ref 70–140)
POTASSIUM: 4.4 meq/L (ref 3.5–5.1)
SODIUM: 141 meq/L (ref 136–145)
Total Bilirubin: 0.28 mg/dL (ref 0.20–1.20)
Total Protein: 7.6 g/dL (ref 6.4–8.3)

## 2017-01-05 LAB — CBC WITH DIFFERENTIAL/PLATELET
BASO%: 0.2 % (ref 0.0–2.0)
Basophils Absolute: 0 10*3/uL (ref 0.0–0.1)
EOS%: 0.8 % (ref 0.0–7.0)
Eosinophils Absolute: 0.2 10*3/uL (ref 0.0–0.5)
HCT: 28.7 % — ABNORMAL LOW (ref 34.8–46.6)
HEMOGLOBIN: 9.2 g/dL — AB (ref 11.6–15.9)
LYMPH#: 14.4 10*3/uL — AB (ref 0.9–3.3)
LYMPH%: 76.8 % — ABNORMAL HIGH (ref 14.0–49.7)
MCH: 24.7 pg — AB (ref 25.1–34.0)
MCHC: 32 g/dL (ref 31.5–36.0)
MCV: 77.1 fL — AB (ref 79.5–101.0)
MONO#: 0.1 10*3/uL (ref 0.1–0.9)
MONO%: 0.5 % (ref 0.0–14.0)
NEUT#: 4.1 10*3/uL (ref 1.5–6.5)
NEUT%: 21.7 % — AB (ref 38.4–76.8)
Platelets: 330 10*3/uL (ref 145–400)
RBC: 3.72 10*6/uL (ref 3.70–5.45)
RDW: 16.7 % — AB (ref 11.2–14.5)
WBC: 18.8 10*3/uL — AB (ref 3.9–10.3)

## 2017-01-05 LAB — FERRITIN: FERRITIN: 25 ng/mL (ref 9–269)

## 2017-01-05 LAB — IRON AND TIBC
%SAT: 9 % — ABNORMAL LOW (ref 21–57)
IRON: 33 ug/dL — AB (ref 41–142)
TIBC: 388 ug/dL (ref 236–444)
UIBC: 354 ug/dL (ref 120–384)

## 2017-01-05 LAB — TECHNOLOGIST REVIEW

## 2017-01-05 LAB — LACTATE DEHYDROGENASE: LDH: 203 U/L (ref 125–245)

## 2017-01-05 MED ORDER — POLYSACCHARIDE IRON COMPLEX 150 MG PO CAPS: 150.0000 mg | ORAL_CAPSULE | Freq: Two times a day (BID) | ORAL | 3 refills | Status: DC

## 2017-01-05 NOTE — Telephone Encounter (Signed)
Gave patient AVS and calendar per 01/05/2017 los

## 2017-01-05 NOTE — Patient Instructions (Signed)
-  Take Iron pills with orange juice to improve absorption -May use over the counter stool softner for constipation as needed. -continue B12 replacement.

## 2017-01-05 NOTE — Progress Notes (Signed)
Marland Kitchen    HEMATOLOGY/ONCOLOGY CLINIC NOTE  Date of Service: .01/05/2017  Patient Care Team: Asencion Noble, MD as PCP - General  CHIEF COMPLAINTS/PURPOSE OF CONSULTATION:  Monoclonal B- lymphoproliferative disorder   HISTORY OF PRESENTING ILLNESS:  Theresa Kramer is a wonderful 76 y.o. female who has been referred to [place] by Dr Asencion Noble, MD for evaluation and management of lymphocytosis.  Patient has a h/o B12 def, depression, arthritis and DDD and was recently admitted to the hospital with TIA vs delirium in the setting of UTI. 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 She is here for further evaluation of her newly diagnosed B-cell leukemia/lymphoma. Patient reports no fevers. Has some night sweats but those have been in the setting of hypoglycemia. No overt LNadenopathy. No abdominal pain or distension. No reported weight loss. Has chronic fatigue that hasnt significantly changed recently.   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 Her daughter accompanies her. Patient notes no new constitutional symptoms. No fevers/chills/nightsweats. Had not been taking Iron -- given prescription to start taking Iron polysaccharide 150mg  po BID CBC showed stable nl LDH and stable WBC around 18k  MEDICAL HISTORY:  Past Medical History:  Diagnosis Date  . Arthritis   . B12 deficiency   . Bronchitis    hx of  . Complication of anesthesia   . Depression    "not taking medication"  . Diabetes mellitus   . GERD (gastroesophageal reflux disease)   . H/O hiatal hernia   . Hyperlipidemia   . Hypertension   . Noncompliance with medication regimen   . Pinched cervical nerve root   . PONV (postoperative nausea and vomiting)   . Urinary tract infection    hx of    SURGICAL HISTORY: Past Surgical History:  Procedure Laterality Date  .  ABDOMINAL HYSTERECTOMY    . ANTERIOR CERVICAL DECOMP/DISCECTOMY FUSION  03/15/2012   Procedure: ANTERIOR CERVICAL DECOMPRESSION/DISCECTOMY FUSION 2 LEVELS;  Surgeon: Ophelia Charter, MD;  Location: [facility];  Service: Neurosurgery;  Laterality: N/A;  Cervical four-five Cervical five six anterior cervical decompression with fusion interbody prothesis plating and bonegraft  . APPENDECTOMY    . BONE CYST EXCISION     from lower back and foot  . CHOLECYSTECTOMY    . COLONOSCOPY  2011   Dr. Gala Romney: prep compromised exam. diverticulosis, hemorrhoids. next tcs 2021  . DILATION AND CURETTAGE OF UTERUS    . 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 Multiple distal rings dilated and disrupted as described above. Small hiatal hernia.   Marland Kitchen EXCISION METACARPAL MASS Left 03/03/2016   Procedure: EXCISION OF LEFT  DORSAL MASS OF EXTENSOR TENDON;  Surgeon: Milly Jakob, MD;  Location: [facility];  Service: Orthopedics;  Laterality: Left;  . EYE SURGERY     bilateral cataract surgery,   . HIP ARTHROPLASTY     right  . JOINT REPLACEMENT    . KNEE SURGERY     torn cartilage repair  . MALONEY DILATION N/A 09/19/2014   Procedure: Venia Minks DILATION;  Surgeon: Daneil Dolin, MD;  [facility];  Service: Endoscopy;  Laterality: N/A;  . ORTHOPEDIC SURGERY    . ROTATOR CUFF REPAIR     "multiple in both shoulders" and total shoulder replacement in left arm  . SAVORY DILATION N/A 09/19/2014  Procedure: SAVORY DILATION;  Surgeon: Daneil Dolin, MD;  Location: [facility];  Service: Endoscopy;  Laterality: N/A;  . TONSILLECTOMY      SOCIAL HISTORY: Social History   Social History  . Marital status: Widowed    Spouse name: N/A  . Number of children: N/A  . Years of education: N/A   Occupational History  . Not on file.   Social History Main Topics  . Smoking status: Former Research scientist (life sciences)  . Smokeless tobacco:  Never Used     Comment: stopped 25 years ago  . Alcohol use No  . Drug use: No  . Sexual activity: Not on file   Other Topics Concern  . Not on file   Social History Narrative  . No narrative on file    FAMILY HISTORY: Family History  Problem Relation Age of Onset  . Cirrhosis Brother     etoh  . Colon cancer Neg Hx     ALLERGIES:  is allergic to meloxicam.  MEDICATIONS:  Current Outpatient Prescriptions  Medication Sig Dispense Refill  . acetaminophen (TYLENOL) 650 MG CR tablet Take 1,300 mg by mouth 2 (two) times daily.    Marland Kitchen amLODipine (NORVASC) 5 MG tablet Take 5 mg by mouth daily.    . benzonatate (TESSALON) 200 MG capsule Take 200 mg by mouth 3 (three) times daily as needed for cough.    . citalopram (CELEXA) 20 MG tablet Take 20 mg by mouth daily.      . clopidogrel (PLAVIX) 75 MG tablet Take 1 tablet (75 mg total) by mouth daily. 90 tablet 0  . clotrimazole (LOTRIMIN) 1 % cream Apply 1 application topically 2 (two) times daily as needed. For rash     . cyanocobalamin (,VITAMIN B-12,) 1000 MCG/ML injection Inject 1,000 mcg into the muscle every 30 (thirty) days. Usually on the 1st     . gemfibrozil (LOPID) 600 MG tablet Take 600 mg by mouth 2 (two) times daily before a meal.      . glipiZIDE (GLUCOTROL XL) 5 MG 24 hr tablet Take 5 mg by mouth daily.      Marland Kitchen HYDROcodone-homatropine (HYCODAN) 5-1.5 MG/5ML syrup Take 5 mLs by mouth every 4 (four) hours as needed for cough.    . insulin glargine (LANTUS) 100 UNIT/ML injection Inject 65 Units into the skin at bedtime.     . insulin lispro (HUMALOG) 100 UNIT/ML injection Inject 12 Units into the skin 3 (three) times daily before meals.     Marland Kitchen LORazepam (ATIVAN) 0.5 MG tablet Take 0.5 mg by mouth 2 (two) times daily.    Marland Kitchen losartan-hydrochlorothiazide (HYZAAR) 100-12.5 MG tablet Take 1 tablet by mouth daily.    . metFORMIN (GLUCOPHAGE) 1000 MG tablet Take 1,000 mg by mouth 2 (two) times daily with a meal.      . oxybutynin  (DITROPAN-XL) 10 MG 24 hr tablet Take 10 mg by mouth daily.     . pantoprazole (PROTONIX) 40 MG tablet Take 40 mg by mouth daily.    . pramipexole (MIRAPEX) 0.25 MG tablet Take 0.25 mg by mouth daily.      . pravastatin (PRAVACHOL) 80 MG tablet Take 1 tablet (80 mg total) by mouth daily. 30 tablet 3  . ranitidine (ZANTAC) 150 MG tablet Take 150 mg by mouth at bedtime.    . risperiDONE (RISPERDAL) 0.25 MG tablet Take 0.5 mg by mouth 2 (two) times daily.   3  . traMADol (ULTRAM) 50 MG tablet Take 1 tablet (  50 mg total) by mouth every 6 (six) hours as needed for moderate pain. 20 tablet 0  . traZODone (DESYREL) 50 MG tablet TAKE 1/2 - 1 TABLET BY MOUTH AT BEDTIME AS NEEDED  1  . iron polysaccharides (NIFEREX) 150 MG capsule Take 1 capsule (150 mg total) by mouth 2 (two) times daily. 60 capsule 3   No current facility-administered medications for this visit.     REVIEW OF SYSTEMS:    10 Point review of Systems was done is negative except as noted above.  PHYSICAL EXAMINATION: ECOG PERFORMANCE STATUS: 2 - Symptomatic, <50% confined to bed  . Vitals:   01/05/17 1000  BP: (!) 156/66  Pulse: 73  Resp: 20  Temp: 98.6 F (37 C)   Filed Weights   01/05/17 1000  Weight: 223 lb 4.8 oz (101.3 kg)   .Body mass index is 38.33 kg/m.  GENERAL:alert, in no acute distress and comfortable SKIN: skin color, texture, turgor are normal, no rashes or significant lesions EYES: normal, conjunctiva are pink and non-injected, sclera clear OROPHARYNX:no exudate, no erythema and lips, buccal mucosa, and tongue normal  NECK: supple, no JVD, thyroid normal size, non-tender, without nodularity LYMPH:  no palpable lymphadenopathy in the cervical, axillary or inguinal LUNGS: clear to auscultation with normal respiratory effort HEART: regular rate & rhythm,  no murmurs and no lower extremity edema ABDOMEN: abdomen soft, non-tender, normoactive bowel sounds , no palpable hepatosplenomegaly. Musculoskeletal:  no cyanosis of digits and no clubbing  PSYCH: alert & oriented x 3 with fluent speech NEURO: no focal motor/sensory deficits  LABORATORY DATA:  I have reviewed the data as listed  . CBC Latest Ref Rng & Units 01/05/2017 11/24/2016 11/17/2016  WBC 3.9 - 10.3 10e3/uL 18.8(H) 18.1(H) 18.0(H)  Hemoglobin 11.6 - 15.9 g/dL 9.2(L) 9.4(L) 9.2(L)  Hematocrit 34.8 - 46.6 % 28.7(L) 29.3(L) 29.4(L)  Platelets 145 - 400 10e3/uL 330 330 312   . CBC    Component Value Date/Time   WBC 18.8 (H) 01/05/2017 0952   WBC 24.4 (H) 08/04/2016 0641   RBC 3.72 01/05/2017 0952   RBC 4.16 08/04/2016 0641   HGB 9.2 (L) 01/05/2017 0952   HCT 28.7 (L) 01/05/2017 0952   PLT 330 01/05/2017 0952   MCV 77.1 (L) 01/05/2017 0952   MCH 24.7 (L) 01/05/2017 0952   MCH 25.5 (L) 08/04/2016 0641   MCHC 32.0 01/05/2017 0952   MCHC 30.5 08/04/2016 0641   RDW 16.7 (H) 01/05/2017 0952   LYMPHSABS 14.4 (H) 01/05/2017 0952   MONOABS 0.1 01/05/2017 0952   EOSABS 0.2 01/05/2017 0952   BASOSABS 0.0 01/05/2017 0952     . CMP Latest Ref Rng & Units 01/05/2017 11/24/2016 11/17/2016  Glucose 70 - 140 mg/dl 131 228(H) 108  BUN 7.0 - 26.0 mg/dL 30.0(H) 20.4 21.6  Creatinine 0.6 - 1.1 mg/dL 1.2(H) 1.4(H) 1.0  Sodium 136 - 145 mEq/L 141 140 143  Potassium 3.5 - 5.1 mEq/L 4.4 4.4 4.1  Chloride 101 - 111 mmol/L - - -  CO2 22 - 29 mEq/L 22 23 25   Calcium 8.4 - 10.4 mg/dL 9.4 9.2 9.6  Total Protein 6.4 - 8.3 g/dL 7.6 7.8 7.8  Total Bilirubin 0.20 - 1.20 mg/dL 0.28 0.34 0.32  Alkaline Phos 40 - 150 U/L 79 90 91  AST 5 - 34 U/L 14 14 19   ALT 0 - 55 U/L 14 15 18    . Lab Results  Component Value Date   LDH 203 01/05/2017  RADIOGRAPHIC STUDIES: I have personally reviewed the radiological images as listed and agreed with the findings in the report. No results found.  PET/CT 10/25/017:  IMPRESSION: 1. Mildly enlarged and mildly hypermetabolic spleen, consistent with the provided history of a lymphoproliferative  disorder. 2. No hypermetabolic lymphadenopathy or other hypermetabolic sites of disease. 3. Additional findings include aortic atherosclerosis, three-vessel coronary atherosclerosis, small hiatal hernia and small fat containing left inguinal hernia.   Electronically Signed   By: Ilona Sorrel M.D.   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 Noted to have elevated wbc/lymphocyte counts end of September. Unknown if she had a more chronic elevation of her WBC counts that might put a timeline on her lymphocytosis.  Differential diagnosis- CD5 neg variant of CLL (about 17-20% patients with CLL could be CD5neg) vs B-cell leukemia/lymphoma  PET/CT with no significant LNadenopathy and PET/CT with minimal enlarged and minimally hypermetabolic. FISH shows Trisomy 50 which would be consistent with CLL LDH WNL  Patient WBC remains stable at around 18k with around 14k lymphocytes. LDH WNL Features consistent with indolent chronic lymphoproliferative process - likely CLL.  2) Anemia - microcytic likely related to iron deficiency . 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 Brings up the possibility of lymphoplasmacytic lymphoma however trisomy 12 is typical for CLL and uncommon in LPL/WM PLAN -started on Iron polysaccharide 150mg  po BID -f/u with PCP to determine need for GI work for Iron deficiency -no indication for treatment of patients CLL at this time -rpt SPEP in 3 months -If iron replacement does not adequate correct anemia we discussed that we might need to  consider a bone marrow examination (patient is very hesitant regarding this and would like to avoid this if possible).  4). Patient Active Problem List   Diagnosis Date Noted  . Somnolence, daytime 12/31/2016  . Snoring 12/31/2016  . Occipital neuralgia   . Acute ischemic stroke ([place])   . Stroke ([place])   . 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 Leukocytosis 08/01/2016  . Urinary tract infectious disease 08/01/2016  . Type 2 diabetes mellitus without complication, with long-term current use of insulin ([place]) 07/30/2016  . Essential hypertension 07/30/2016  . Osteoarthritis 07/30/2016  . Normocytic anemia 07/30/2016  . HLD (hyperlipidemia)   . Schatzki's ring   . Cervical herniated disc 03/15/2012  . GERD 04/29/2010  . CHEST PAIN, ATYPICAL 04/29/2010  . DYSPHAGIA UNSPECIFIED 04/29/2010  -continue rf/u with PCP.    RTC with Dr Irene Limbo in 3 months with cbc/diff, cmp, ldh, ferritin, iron profile, b12  All of the patients questions were answered with apparent satisfaction. The patient knows to call the clinic with any problems, questions or concerns.   I spent 20 minutes counseling the patient face to face. The total time spent in the appointment was 25 minutes and more than 50% was on counseling and direct patient cares.    Sullivan Lone MD [place] AAHIVMS [facility]  [facility] Hematology/Oncology Physician [facility]  (Office):       [phone] (Work cell):  [phone] (Fax):           [phone]

## 2017-01-06 LAB — VITAMIN B12: VITAMIN B 12: 642 pg/mL (ref 232–1245)

## 2017-01-07 NOTE — Progress Notes (Signed)
I reviewed note and agree with plan.   Penni Bombard, MD 5/0/2774, 1:28 PM Certified in Neurology, Neurophysiology and Neuroimaging  [facility] [address] [place],  [address] [phone]

## 2017-01-20 ENCOUNTER — Other Ambulatory Visit: Payer: Self-pay | Admitting: Neurology

## 2017-01-27 ENCOUNTER — Institutional Professional Consult (permissible substitution): Payer: Medicare Other | Admitting: Neurology

## 2017-02-17 DIAGNOSIS — E785 Hyperlipidemia, unspecified: Secondary | ICD-10-CM | POA: Diagnosis not present

## 2017-02-17 DIAGNOSIS — Z79899 Other long term (current) drug therapy: Secondary | ICD-10-CM | POA: Diagnosis not present

## 2017-02-17 DIAGNOSIS — K219 Gastro-esophageal reflux disease without esophagitis: Secondary | ICD-10-CM | POA: Diagnosis not present

## 2017-02-17 DIAGNOSIS — E119 Type 2 diabetes mellitus without complications: Secondary | ICD-10-CM | POA: Diagnosis not present

## 2017-02-17 DIAGNOSIS — I10 Essential (primary) hypertension: Secondary | ICD-10-CM | POA: Diagnosis not present

## 2017-02-20 ENCOUNTER — Emergency Department (HOSPITAL_BASED_OUTPATIENT_CLINIC_OR_DEPARTMENT_OTHER)
Admission: EM | Admit: 2017-02-20 | Discharge: 2017-02-20 | Disposition: A | Discharge status: Home / Self Care | Payer: Medicare Other | Attending: Emergency Medicine | Admitting: Emergency Medicine

## 2017-02-20 ENCOUNTER — Encounter (HOSPITAL_BASED_OUTPATIENT_CLINIC_OR_DEPARTMENT_OTHER): Payer: Self-pay | Admitting: Emergency Medicine

## 2017-02-20 ENCOUNTER — Emergency Department (HOSPITAL_BASED_OUTPATIENT_CLINIC_OR_DEPARTMENT_OTHER): Payer: Medicare Other

## 2017-02-20 DIAGNOSIS — Z87891 Personal history of nicotine dependence: Secondary | ICD-10-CM | POA: Insufficient documentation

## 2017-02-20 DIAGNOSIS — M79662 Pain in left lower leg: Secondary | ICD-10-CM | POA: Diagnosis not present

## 2017-02-20 DIAGNOSIS — I10 Essential (primary) hypertension: Secondary | ICD-10-CM | POA: Diagnosis not present

## 2017-02-20 DIAGNOSIS — Z794 Long term (current) use of insulin: Secondary | ICD-10-CM | POA: Diagnosis not present

## 2017-02-20 DIAGNOSIS — E119 Type 2 diabetes mellitus without complications: Secondary | ICD-10-CM | POA: Diagnosis not present

## 2017-02-20 DIAGNOSIS — Z79899 Other long term (current) drug therapy: Secondary | ICD-10-CM | POA: Diagnosis not present

## 2017-02-20 DIAGNOSIS — M79605 Pain in left leg: Secondary | ICD-10-CM | POA: Diagnosis not present

## 2017-02-20 DIAGNOSIS — M79661 Pain in right lower leg: Secondary | ICD-10-CM | POA: Diagnosis not present

## 2017-02-20 DIAGNOSIS — M79604 Pain in right leg: Secondary | ICD-10-CM | POA: Insufficient documentation

## 2017-02-20 DIAGNOSIS — M79606 Pain in leg, unspecified: Secondary | ICD-10-CM

## 2017-02-20 HISTORY — DX: Malignant (primary) neoplasm, unspecified: C80.1

## 2017-02-20 HISTORY — DX: Cerebral infarction, unspecified: I63.9

## 2017-02-20 MED ORDER — TRAMADOL HCL 50 MG PO TABS
50.0000 mg | ORAL_TABLET | Freq: Once | ORAL | Status: AC
  Administered 2017-02-20: 50 mg via ORAL
  Filled 2017-02-20: qty 1

## 2017-02-20 MED ORDER — TRAMADOL HCL 50 MG PO TABS: 50.0000 mg | ORAL_TABLET | Freq: Four times a day (QID) | ORAL | 0 refills | Status: DC | PRN

## 2017-02-20 MED FILL — traMADol HCL 50 MG TABS: 50 | 4 days supply | Qty: 15 | Fill #0

## 2017-02-20 NOTE — ED Provider Notes (Signed)
[facility] DEPT [facility] Provider Note   CSN: [id number] Arrival date & time: 02/20/17  9373     History   Chief Complaint Chief Complaint  Patient presents with  . Leg Pain    HPI Theresa Kramer is a 76 y.o. female.  HPI  76 year old female who presents with bilateral leg pain. History of CLL under observation, DM and prior CVA. Onset of bilateral leg pain just prior to arrival. Woke up this morning feeling normal, but as she was walking and moving around noticed gradually worsening pain. Pain throbbing behind bilateral upper legs to behind he knee. No leg swelling, no numbness, no weakness. No fall or trauma. She is very sedentary. Yesterday was passenger in rental car (2.5 h road trip). Notes pain is worse when siting down. Improved when walking and on her feet. Did not take any medications prior to arrival for symptoms. No fever or chills, chest pain or dyspnea.  Past Medical History:  Diagnosis Date  . Arthritis   . B12 deficiency   . Bronchitis    hx of  . Cancer ([place])   . Complication of anesthesia   . Depression    "not taking medication"  . Diabetes mellitus   . GERD (gastroesophageal reflux disease)   . H/O hiatal hernia   . Hyperlipidemia   . Hypertension   . Noncompliance with medication regimen   . Pinched cervical nerve root   . PONV (postoperative nausea and vomiting)   . Stroke ([place])   . Urinary tract infection    hx of    Patient Active Problem List   Diagnosis Date Noted  . Somnolence, daytime 12/31/2016  . Snoring 12/31/2016  . Occipital neuralgia   . Acute ischemic stroke ([place])   . Stroke ([place])   . 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 Leukocytosis 08/01/2016  . Urinary tract infectious disease 08/01/2016  . Type 2 diabetes mellitus without complication, with long-term current use of insulin ([place]) 07/30/2016  . Essential hypertension 07/30/2016  . Osteoarthritis  07/30/2016  . Normocytic anemia 07/30/2016  . HLD (hyperlipidemia)   . Schatzki's ring   . Cervical herniated disc 03/15/2012  . GERD 04/29/2010  . CHEST PAIN, ATYPICAL 04/29/2010  . DYSPHAGIA UNSPECIFIED 04/29/2010    Past Surgical History:  Procedure Laterality Date  . ABDOMINAL HYSTERECTOMY    . ANTERIOR CERVICAL DECOMP/DISCECTOMY FUSION  03/15/2012   Procedure: ANTERIOR CERVICAL DECOMPRESSION/DISCECTOMY FUSION 2 LEVELS;  Surgeon: Ophelia Charter, MD;  Location: [facility];  Service: Neurosurgery;  Laterality: N/A;  Cervical four-five Cervical five six anterior cervical decompression with fusion interbody prothesis plating and bonegraft  . APPENDECTOMY    . BONE CYST EXCISION     from lower back and foot  . CHOLECYSTECTOMY    . COLONOSCOPY  2011   Dr. Gala Romney: prep compromised exam. diverticulosis, hemorrhoids. next tcs 2021  . DILATION AND CURETTAGE OF UTERUS    . 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 Multiple distal rings dilated and disrupted as described above. Small hiatal hernia.   Marland Kitchen EXCISION METACARPAL MASS Left 03/03/2016   Procedure: EXCISION OF LEFT  DORSAL MASS OF EXTENSOR TENDON;  Surgeon: Milly Jakob, MD;  Location: [facility];  Service: Orthopedics;  Laterality: Left;  . EYE SURGERY     bilateral cataract surgery,   . HIP ARTHROPLASTY     right  .  JOINT REPLACEMENT    . KNEE SURGERY     torn cartilage repair  . MALONEY DILATION N/A 09/19/2014   Procedure: Venia Minks DILATION;  Surgeon: Daneil Dolin, MD;  [facility];  Service: Endoscopy;  Laterality: N/A;  . ORTHOPEDIC SURGERY    . ROTATOR CUFF REPAIR     "multiple in both shoulders" and total shoulder replacement in left arm  . SAVORY DILATION N/A 09/19/2014   Procedure: SAVORY DILATION;  Surgeon: Daneil Dolin, MD;  [facility];  Service: Endoscopy;  Laterality: N/A;  . TONSILLECTOMY       OB History    No data available       Home Medications    Prior to Admission medications   Medication Sig Start Date End Date Taking? Authorizing Provider  ferrous sulfate 325 (65 FE) MG EC tablet Take 325 mg by mouth 3 (three) times daily with meals.   Yes Historical Provider, MD  acetaminophen (TYLENOL) 650 MG CR tablet Take 1,300 mg by mouth 2 (two) times daily.    Historical Provider, MD  amLODipine (NORVASC) 5 MG tablet Take 5 mg by mouth daily.    Historical Provider, MD  benzonatate (TESSALON) 200 MG capsule Take 200 mg by mouth 3 (three) times daily as needed for cough.    Historical Provider, MD  citalopram (CELEXA) 20 MG tablet Take 20 mg by mouth daily.      Historical Provider, MD  clopidogrel (PLAVIX) 75 MG tablet TAKE 1 TABLET BY MOUTH  DAILY 01/20/17   Rosalin Hawking, MD  clotrimazole (LOTRIMIN) 1 % cream Apply 1 application topically 2 (two) times daily as needed. For rash     Historical Provider, MD  cyanocobalamin (,VITAMIN B-12,) 1000 MCG/ML injection Inject 1,000 mcg into the muscle every 30 (thirty) days. Usually on the 1st     Historical Provider, MD  gemfibrozil (LOPID) 600 MG tablet Take 600 mg by mouth 2 (two) times daily before a meal.      Historical Provider, MD  glipiZIDE (GLUCOTROL XL) 5 MG 24 hr tablet Take 5 mg by mouth daily.      Historical Provider, MD  HYDROcodone-homatropine (HYCODAN) 5-1.5 MG/5ML syrup Take 5 mLs by mouth every 4 (four) hours as needed for cough.    Historical Provider, MD  insulin glargine (LANTUS) 100 UNIT/ML injection Inject 65 Units into the skin at bedtime.     Historical Provider, MD  insulin lispro (HUMALOG) 100 UNIT/ML injection Inject 12 Units into the skin 3 (three) times daily before meals.     Historical Provider, MD  iron polysaccharides (NIFEREX) 150 MG capsule Take 1 capsule (150 mg total) by mouth 2 (two) times daily. 01/05/17   Brunetta Genera, MD  LORazepam (ATIVAN) 0.5 MG tablet Take 0.5 mg by mouth 2 (two) times  daily.    Historical Provider, MD  losartan-hydrochlorothiazide (HYZAAR) 100-12.5 MG tablet Take 1 tablet by mouth daily.    Historical Provider, MD  metFORMIN (GLUCOPHAGE) 1000 MG tablet Take 1,000 mg by mouth 2 (two) times daily with a meal.      Historical Provider, MD  oxybutynin (DITROPAN-XL) 10 MG 24 hr tablet Take 10 mg by mouth daily.     Historical Provider, MD  pantoprazole (PROTONIX) 40 MG tablet Take 40 mg by mouth daily.    Historical Provider, MD  pramipexole (MIRAPEX) 0.25 MG tablet Take 0.25 mg by mouth daily.      Historical Provider, MD  pravastatin (PRAVACHOL) 80  MG tablet Take 1 tablet (80 mg total) by mouth daily. 08/01/16   Kelvin Cellar, MD  ranitidine (ZANTAC) 150 MG tablet Take 150 mg by mouth at bedtime.    Historical Provider, MD  risperiDONE (RISPERDAL) 0.25 MG tablet Take 0.5 mg by mouth 2 (two) times daily.  09/08/16   Historical Provider, MD  traMADol (ULTRAM) 50 MG tablet Take 1 tablet (50 mg total) by mouth every 6 (six) hours as needed for moderate pain. 08/04/16   [place], DO  traMADol (ULTRAM) 50 MG tablet Take 1 tablet (50 mg total) by mouth every 6 (six) hours as needed. 02/20/17   Forde Dandy, MD  traZODone (DESYREL) 50 MG tablet TAKE 1/2 - 1 TABLET BY MOUTH AT BEDTIME AS NEEDED 09/14/16   Historical Provider, MD    Family History Family History  Problem Relation Age of Onset  . Cirrhosis Brother     etoh  . Colon cancer Neg Hx     Social History Social History  Substance Use Topics  . Smoking status: Former Research scientist (life sciences)  . Smokeless tobacco: Never Used     Comment: stopped 25 years ago  . Alcohol use No     Allergies   Meloxicam   Review of Systems Review of Systems  Constitutional: Negative for fever.  Respiratory: Negative for shortness of breath.   Cardiovascular: Negative for chest pain and leg swelling.  Gastrointestinal: Negative for vomiting.  Musculoskeletal: Negative for back pain.       Bilateral leg pain   Skin:  Negative for wound.  Allergic/Immunologic: Negative for immunocompromised state.  Neurological: Negative for syncope.  Hematological: Bruises/bleeds easily (on plavix).  All other systems reviewed and are negative.    Physical Exam Updated Vital Signs BP 128/60 (BP Location: Left Arm)   Pulse 89   Temp 98.2 F (36.8 C) (Oral)   Resp 20   Ht 5\' 5"  (1.651 m)   Wt 223 lb (101.2 kg)   SpO2 98%   BMI 37.11 kg/m   Physical Exam Physical Exam  Nursing note and vitals reviewed. Constitutional: Well developed, well nourished, non-toxic, and in no acute distress Head: Normocephalic and atraumatic.  Mouth/Throat: Oropharynx is clear and moist.  Neck: Normal range of motion. Neck supple.  Cardiovascular: Normal rate and regular rhythm.  +2 DP pulses Pulmonary/Chest: Effort normal and breath sounds normal.  Abdominal: Soft. There is no tenderness. There is no rebound and no guarding.  Musculoskeletal: Normal range of motion of bilateral LE. No deformities. No Swelling. Mildly tender to palpation behind bilateral upper legs.  Neurological: Alert, no facial droop, fluent speech, moves all extremities symmetrically, sensation to light touch intact throughout bilateral lower extremities, full strength in ankle dorsiflexion and plantar flexion, knee flexion and extension, hip flexion and extension Skin: Skin is warm and dry.  no rash or overlying erythema. Psychiatric: Cooperative   ED Treatments / Results  Labs (all labs ordered are listed, but only abnormal results are displayed) Labs Reviewed - No data to display  EKG  EKG Interpretation None       Radiology US Venous Img Lower Bilateral  Result Date: 02/20/2017 CLINICAL DATA:  Bilateral lower extremity pain. EXAM: BILATERAL LOWER EXTREMITY VENOUS DOPPLER ULTRASOUND TECHNIQUE: Gray-scale sonography with graded compression, as well as color Doppler and duplex ultrasound were performed to evaluate the lower extremity deep venous  systems from the level of the common femoral vein and including the common femoral, femoral, profunda femoral, popliteal and calf veins  including the posterior tibial, peroneal and gastrocnemius veins when visible. The superficial great saphenous vein was also interrogated. Spectral Doppler was utilized to evaluate flow at rest and with distal augmentation maneuvers in the common femoral, femoral and popliteal veins. COMPARISON:  None. FINDINGS: RIGHT LOWER EXTREMITY Common Femoral Vein: No evidence of thrombus. Normal compressibility, respiratory phasicity and response to augmentation. Saphenofemoral Junction: No evidence of thrombus. Normal compressibility and flow on color Doppler imaging. Profunda Femoral Vein: No evidence of thrombus. Normal compressibility and flow on color Doppler imaging. Femoral Vein: No evidence of thrombus. Normal compressibility, respiratory phasicity and response to augmentation. Popliteal Vein: No evidence of thrombus. Normal compressibility, respiratory phasicity and response to augmentation. Calf Veins: No evidence of thrombus. Normal compressibility and flow on color Doppler imaging. Superficial Great Saphenous Vein: No evidence of thrombus. Normal compressibility and flow on color Doppler imaging. Venous Reflux:  None. Other Findings:  None. LEFT LOWER EXTREMITY Common Femoral Vein: No evidence of thrombus. Normal compressibility, respiratory phasicity and response to augmentation. Saphenofemoral Junction: No evidence of thrombus. Normal compressibility and flow on color Doppler imaging. Profunda Femoral Vein: No evidence of thrombus. Normal compressibility and flow on color Doppler imaging. Femoral Vein: No evidence of thrombus. Normal compressibility, respiratory phasicity and response to augmentation. Popliteal Vein: No evidence of thrombus. Normal compressibility, respiratory phasicity and response to augmentation. Calf Veins: No evidence of thrombus. Normal compressibility and  flow on color Doppler imaging. Superficial Great Saphenous Vein: No evidence of thrombus. Normal compressibility and flow on color Doppler imaging. Venous Reflux:  None. Other Findings:  None. IMPRESSION: No evidence of deep venous thrombosis seen in either lower extremity. Electronically Signed   By: Marijo Conception, M.D.   On: 02/20/2017 09:49    Procedures Procedures (including critical care time)  Medications Ordered in ED Medications  traMADol (ULTRAM) tablet 50 mg (50 mg Oral Given 02/20/17 0857)     Initial Impression / Assessment and Plan / ED Course  I have reviewed the triage vital signs and the nursing notes.  Pertinent labs & imaging results that were available during my care of the patient were reviewed by me and considered in my medical decision making (see chart for details).     Presents with pain behind bilateral legs. Nontoxic in no acute distress. Extremities are neurovascularly intact. No associating back pain, and description of pain does not sound radicular. Seems musculoskeletal in nature. Given history of CLL and sedentary lifestyle, we'll rule out DVT.  Ultrasound of the lower extremities does not show evidence of DVT or other major abnormalities. Patient received tramadol, to good effect. I discussed supportive care management for likely musculoskeletal pain. Strict return and follow-up instructions reviewed. She expressed understanding of all discharge instructions and felt comfortable with the plan of care.   Final Clinical Impressions(s) / ED Diagnoses   Final diagnoses:  Leg pain  Bilateral leg pain    New Prescriptions New Prescriptions   TRAMADOL (ULTRAM) 50 MG TABLET    Take 1 tablet (50 mg total) by mouth every 6 (six) hours as needed.     Forde Dandy, MD 02/20/17 [phone]

## 2017-02-20 NOTE — Discharge Instructions (Signed)
Your ultrasound does not show blood clot  This is like muscle pains. Take medications as prescribed. Follow-up with PCP for ongoing symptoms next week. This can take several days to resolve  Return without fail for worsening symptoms, including inability to walk, escalating pain, fever, numbness/weakness or any other symptoms concerning to you.

## 2017-02-20 NOTE — ED Notes (Signed)
Pt ambulatory without assistance to and from BR, in NAD.

## 2017-02-20 NOTE — ED Triage Notes (Signed)
Bilateral leg pain to thighs which started this morning. Pt denies injury. Her legs did not hurt when she woke up but started hurting after she got dressed.

## 2017-02-20 NOTE — ED Notes (Signed)
Patient transported to Ultrasound 

## 2017-02-20 NOTE — ED Notes (Signed)
Pt refused wheelchair. Daughter states it feels better standing for pt

## 2017-03-04 ENCOUNTER — Encounter: Payer: Self-pay | Admitting: Internal Medicine

## 2017-03-04 DIAGNOSIS — I10 Essential (primary) hypertension: Secondary | ICD-10-CM | POA: Diagnosis not present

## 2017-03-04 DIAGNOSIS — E1129 Type 2 diabetes mellitus with other diabetic kidney complication: Secondary | ICD-10-CM | POA: Diagnosis not present

## 2017-03-04 DIAGNOSIS — E785 Hyperlipidemia, unspecified: Secondary | ICD-10-CM | POA: Diagnosis not present

## 2017-03-25 ENCOUNTER — Other Ambulatory Visit: Payer: Self-pay

## 2017-03-25 ENCOUNTER — Ambulatory Visit (INDEPENDENT_AMBULATORY_CARE_PROVIDER_SITE_OTHER): Payer: Medicare Other | Admitting: Nurse Practitioner

## 2017-03-25 ENCOUNTER — Encounter: Payer: Self-pay | Admitting: Nurse Practitioner

## 2017-03-25 DIAGNOSIS — D509 Iron deficiency anemia, unspecified: Secondary | ICD-10-CM

## 2017-03-25 MED ORDER — PEG 3350-KCL-NA BICARB-NACL 420 G PO SOLR: 4000.0000 mL | ORAL | 0 refills | Status: DC

## 2017-03-25 NOTE — Progress Notes (Signed)
Primary Care Physician:  Asencion Noble, MD Primary Gastroenterologist:  Dr. Gala Romney  Chief Complaint  Patient presents with  . Anemia    also has leukemia    HPI:   Theresa Kramer is a 76 y.o. female who presents on referral from PCP for iron deficiency anemia. The patient was last seen in our office on 03/20/15 for GERD and Schatzki's Ring. At that time it was noted that the patient was therefore follow-up to EGD in November 2015 for recurrent dysphagia with a rather large mid esophageal diverticulum and 3 ring superimposed on one another in tandem at the GE junction with a small hiatal hernia. Status post dilation to 20 mm which did not disrupt the rings. Jumbo biceps he forceps were utilized to disrupt the rings and recommendation for future dilations employ guidewire dilation under fluoroscopy given risk to perforation.   At the time of her last visit dysphagia and was completely resolved and doing well on Protonix orally milligrams daily and Zantac at bedtime. Rare heartburn breakthrough which does resolve with Pepcid. Last colonoscopy 2011 with noted poor prep but recommended waiting until 2021 before her next colonoscopy. Recommend follow-up as needed for recurrent dysphagia, follow-up for colonoscopy in 2021.  Notes from primary care were reviewed. Her most recent labs show hemoglobin of 9.2 which is microcytic and hypochromic on 01/05/2017. Iron noted to be low at 33 with iron sat of 9%, also low. Ferritin 25. History of B12 deficiency on monthly replacement. Last seen by oncology 01/05/2017.   Last colonoscopy completed 07/12/2010 for first-ever screening colonoscopy which noted anal canal hemorrhoid tags, left-sided diverticulosis, and remainder of exam compromised by poor prep. Recommended follow-up for consideration of repeat colonoscopy in 10 years.  Today she's accompanied by her daughter who helps with her care. 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 Denies abdominal pain, NV, hematochezia, melena, unintentional weight loss, fevers, chills. Has not had hemoccult card completed. Denies constipation, typically 1-2 bowel movements a day which are consistent with [place] 4. Denies chest pain, dyspnea, dizziness, lightheadedness, syncope, near syncope. Denies any other upper or lower GI symptoms.  Is having some fatigue which is chronic but is on 3-4 antianxiety medications.  Past Medical History:  Diagnosis Date  . Arthritis   . B12 deficiency   . Bronchitis    hx of  . Cancer ([place])   . Complication of anesthesia   . Depression    "not taking medication"  . Diabetes mellitus   . GERD (gastroesophageal reflux disease)   . H/O hiatal hernia   . Hyperlipidemia   . Hypertension   . Noncompliance with medication regimen   . Pinched cervical nerve root   . PONV (postoperative nausea and vomiting)   . Stroke ([place])   . Urinary tract infection    hx of    Past Surgical History:  Procedure Laterality Date  . ABDOMINAL HYSTERECTOMY    . ANTERIOR CERVICAL DECOMP/DISCECTOMY FUSION  03/15/2012   Procedure: ANTERIOR CERVICAL DECOMPRESSION/DISCECTOMY FUSION 2 LEVELS;  Surgeon: Ophelia Charter, MD;  Location: [facility];  Service: Neurosurgery;  Laterality: N/A;  Cervical four-five Cervical five six anterior cervical decompression with fusion interbody prothesis plating and bonegraft  . APPENDECTOMY    . BONE CYST EXCISION     from lower back and  foot  . CHOLECYSTECTOMY    . COLONOSCOPY  2011   Dr. Gala Romney: prep compromised exam. diverticulosis, hemorrhoids. next tcs 2021  . DILATION AND CURETTAGE OF UTERUS    . 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 Multiple distal rings dilated and disrupted as described above. Small hiatal hernia.    Marland Kitchen EXCISION METACARPAL MASS Left 03/03/2016   Procedure: EXCISION OF LEFT  DORSAL MASS OF EXTENSOR TENDON;  Surgeon: Milly Jakob, MD;  Location: [facility];  Service: Orthopedics;  Laterality: Left;  . EYE SURGERY     bilateral cataract surgery,   . HIP ARTHROPLASTY     right  . JOINT REPLACEMENT    . KNEE SURGERY     torn cartilage repair  . MALONEY DILATION N/A 09/19/2014   Procedure: Venia Minks DILATION;  Surgeon: Daneil Dolin, MD;  [facility];  Service: Endoscopy;  Laterality: N/A;  . ORTHOPEDIC SURGERY    . ROTATOR CUFF REPAIR     "multiple in both shoulders" and total shoulder replacement in left arm  . SAVORY DILATION N/A 09/19/2014   Procedure: SAVORY DILATION;  Surgeon: Daneil Dolin, MD;  [facility];  Service: Endoscopy;  Laterality: N/A;  . TONSILLECTOMY      Current Outpatient Prescriptions  Medication Sig Dispense Refill  . acetaminophen (TYLENOL ARTHRITIS PAIN) 650 MG CR tablet Take 650 mg by mouth 2 (two) times daily.    Marland Kitchen acetaminophen (TYLENOL) 650 MG CR tablet Take 1,300 mg by mouth 2 (two) times daily.    Marland Kitchen amLODipine (NORVASC) 5 MG tablet Take 5 mg by mouth daily.    . citalopram (CELEXA) 20 MG tablet Take 20 mg by mouth daily.      . clopidogrel (PLAVIX) 75 MG tablet TAKE 1 TABLET BY MOUTH  DAILY 90 tablet 3  . clotrimazole (LOTRIMIN) 1 % cream Apply 1 application topically 2 (two) times daily as needed. For rash     . cyanocobalamin (,VITAMIN B-12,) 1000 MCG/ML injection Inject 1,000 mcg into the muscle every 30 (thirty) days. Usually on the 1st     . ferrous sulfate 325 (65 FE) MG EC tablet Take 325 mg by mouth daily.     Marland Kitchen glipiZIDE (GLUCOTROL XL) 5 MG 24 hr tablet Take 5 mg by mouth daily.      . insulin glargine (LANTUS) 100 UNIT/ML injection Inject 55 Units into the skin at bedtime.     . insulin lispro (HUMALOG) 100 UNIT/ML injection Inject 8 Units into the skin 3 (three) times daily before meals.     Marland Kitchen LORazepam  (ATIVAN) 0.5 MG tablet Take 0.5 mg by mouth 2 (two) times daily.    Marland Kitchen losartan-hydrochlorothiazide (HYZAAR) 100-12.5 MG tablet Take 1 tablet by mouth daily.    . metFORMIN (GLUCOPHAGE) 1000 MG tablet Take 500 mg by mouth 2 (two) times daily with a meal.     . oxybutynin (DITROPAN-XL) 10 MG 24 hr tablet Take 10 mg by mouth daily.     . pantoprazole (PROTONIX) 40 MG tablet Take 40 mg by mouth daily.    . pramipexole (MIRAPEX) 0.25 MG tablet Take 0.25 mg by mouth daily.      . pravastatin (PRAVACHOL) 80 MG tablet Take 1 tablet (80 mg total) by mouth daily. 30 tablet 3  . ranitidine (ZANTAC) 150 MG tablet Take 150 mg by mouth at bedtime.    . risperiDONE (RISPERDAL) 0.25 MG tablet Take 0.5  mg by mouth 2 (two) times daily.   3  . traMADol (ULTRAM) 50 MG tablet Take 1 tablet (50 mg total) by mouth every 6 (six) hours as needed for moderate pain. 20 tablet 0  . traMADol (ULTRAM) 50 MG tablet Take 1 tablet (50 mg total) by mouth every 6 (six) hours as needed. 15 tablet 0  . traZODone (DESYREL) 50 MG tablet TAKE 1/2 - 1 TABLET BY MOUTH AT BEDTIME AS NEEDED. takes 1 tablet at bedtime  1   No current facility-administered medications for this visit.     Allergies as of 03/25/2017 - Review Complete 03/25/2017  Allergen Reaction Noted  . Meloxicam Other (See Comments) 03/11/2012    Family History  Problem Relation Age of Onset  . Cirrhosis Brother        etoh  . Colon cancer Neg Hx     Social History   Social History  . Marital status: Widowed    Spouse name: N/A  . Number of children: N/A  . Years of education: N/A   Occupational History  . Not on file.   Social History Main Topics  . Smoking status: Former Research scientist (life sciences)  . Smokeless tobacco: Never Used     Comment: stopped 25 years ago  . Alcohol use No  . Drug use: No  . Sexual activity: Not on file   Other Topics Concern  . Not on file   Social History Narrative  . No narrative on file    Review of Systems: General: Negative  for anorexia, weight loss, fever, chills. Eyes: Negative for vision changes.  ENT: Negative for hoarseness, difficulty swallowing. CV: Negative for chest pain, angina, palpitations, peripheral edema.  Respiratory: Negative for dyspnea at rest, cough, sputum, wheezing.  GI: See history of present illness. MS: Occasional chronic pain flares.  Endo: Negative for unusual weight change.  Heme: Negative for bruising or bleeding. Allergy: Negative for rash or hives.    Physical Exam: BP 124/63   Pulse 89   Temp 97.9 F (36.6 C) (Oral)   Ht 5\' 5"  (1.651 m)   Wt 218 lb 3.2 oz (99 kg)   BMI 36.31 kg/m  General:   Alert and oriented. Pleasant and cooperative. Well-nourished and well-developed.  Head:  Normocephalic and atraumatic. Eyes:  Without icterus, sclera clear and conjunctiva pink.  Ears:  Normal auditory acuity. Cardiovascular:  S1, S2 present without murmurs appreciated. Extremities without clubbing or edema. Respiratory:  Clear to auscultation bilaterally. No wheezes, rales, or rhonchi. No distress.  Gastrointestinal:  +BS, soft, non-tender and non-distended. No HSM noted. No masses appreciated.  Rectal:  Deferred  Musculoskalatal:  Symmetrical without gross deformities. Neurologic:  Alert and oriented x4;  grossly normal neurologically. Psych:  Alert and cooperative. Normal mood and affect. Heme/Lymph/Immune: No excessive bruising noted.    03/25/2017 11:42 AM   Disclaimer: This note was dictated with voice recognition software. Similar sounding words can inadvertently be transcribed and may not be corrected upon review.

## 2017-03-25 NOTE — Patient Instructions (Signed)
1. We will schedule your procedures for you. 2. Return for follow-up in 3 months. 3. Further recommendations to be made based on the results of your procedures. 4. Call with any questions or concerns. 5. Stop taking iron 2 weeks prior to your colonoscopy.

## 2017-03-25 NOTE — Progress Notes (Signed)
cc'ed to pcp °

## 2017-03-25 NOTE — Assessment & Plan Note (Addendum)
The patient has notable iron deficiency anemia with a hemoglobin of 9.3, low iron, low iron sat but apparently normal ferritin. Is seen by oncology. PCP recommended GI evaluation. Her last colonoscopy was 2011 and her last endoscopy 2015, as per history of present illness. At this point we will proceed with colonoscopy and endoscopy to further evaluate for possible GI tract sources of bleeding. She has not noted any obvious bleeding. We will have her return for follow-up in 3 months to review results and make further decisions. She may need a Givens capsule endoscopy to wrap up GI evaluation, pending her results of her upper and lower.  Proceed with TCS and EGD with Dr. Gala Romney in near future: the risks, benefits, and alternatives have been discussed with the patient in detail. The patient states understanding and desires to proceed.  She was currently on Celexa, Plavix, Ativan, Risperdal, Ultram, trazodone. We will plan for the procedure on propofol/MAC to promote adequate sedation.  She is on diabetes medicines. We will have her hold half her normal evening doses the night performed procedure and take none the morning of her procedure.

## 2017-04-08 ENCOUNTER — Encounter: Payer: Self-pay | Admitting: Hematology

## 2017-04-08 ENCOUNTER — Other Ambulatory Visit: Payer: Self-pay | Admitting: Internal Medicine

## 2017-04-08 ENCOUNTER — Other Ambulatory Visit: Payer: Self-pay

## 2017-04-08 ENCOUNTER — Other Ambulatory Visit (HOSPITAL_BASED_OUTPATIENT_CLINIC_OR_DEPARTMENT_OTHER): Payer: Medicare Other

## 2017-04-08 ENCOUNTER — Ambulatory Visit (HOSPITAL_BASED_OUTPATIENT_CLINIC_OR_DEPARTMENT_OTHER): Payer: Medicare Other | Admitting: Hematology

## 2017-04-08 ENCOUNTER — Telehealth: Payer: Self-pay | Admitting: Hematology

## 2017-04-08 VITALS — BP 145/45 | HR 90 | Temp 98.9°F | Resp 18 | Ht 65.0 in | Wt 221.1 lb

## 2017-04-08 DIAGNOSIS — D500 Iron deficiency anemia secondary to blood loss (chronic): Secondary | ICD-10-CM

## 2017-04-08 DIAGNOSIS — C9110 Chronic lymphocytic leukemia of B-cell type not having achieved remission: Secondary | ICD-10-CM

## 2017-04-08 DIAGNOSIS — C9190 Lymphoid leukemia, unspecified not having achieved remission: Secondary | ICD-10-CM | POA: Diagnosis not present

## 2017-04-08 DIAGNOSIS — E538 Deficiency of other specified B group vitamins: Secondary | ICD-10-CM

## 2017-04-08 DIAGNOSIS — D472 Monoclonal gammopathy: Secondary | ICD-10-CM | POA: Diagnosis not present

## 2017-04-08 DIAGNOSIS — D72820 Lymphocytosis (symptomatic): Secondary | ICD-10-CM | POA: Diagnosis not present

## 2017-04-08 DIAGNOSIS — R768 Other specified abnormal immunological findings in serum: Secondary | ICD-10-CM | POA: Diagnosis not present

## 2017-04-08 DIAGNOSIS — D509 Iron deficiency anemia, unspecified: Secondary | ICD-10-CM

## 2017-04-08 LAB — COMPREHENSIVE METABOLIC PANEL
ALT: 21 U/L (ref 0–55)
ANION GAP: 12 meq/L — AB (ref 3–11)
AST: 19 U/L (ref 5–34)
Albumin: 3.5 g/dL (ref 3.5–5.0)
Alkaline Phosphatase: 112 U/L (ref 40–150)
BUN: 20.9 mg/dL (ref 7.0–26.0)
CALCIUM: 9.4 mg/dL (ref 8.4–10.4)
CO2: 23 mEq/L (ref 22–29)
Chloride: 106 mEq/L (ref 98–109)
Creatinine: 0.9 mg/dL (ref 0.6–1.1)
EGFR: 59 mL/min/{1.73_m2} — AB (ref >90)
Glucose: 175 mg/dl — ABNORMAL HIGH (ref 70–140)
POTASSIUM: 4.3 meq/L (ref 3.5–5.1)
Sodium: 140 mEq/L (ref 136–145)
Total Bilirubin: 0.41 mg/dL (ref 0.20–1.20)
Total Protein: 7.2 g/dL (ref 6.4–8.3)

## 2017-04-08 LAB — CBC & DIFF AND RETIC
BASO%: 0.2 % (ref 0.0–2.0)
BASOS ABS: 0 10*3/uL (ref 0.0–0.1)
EOS%: 0.6 % (ref 0.0–7.0)
Eosinophils Absolute: 0.1 10*3/uL (ref 0.0–0.5)
HCT: 29 % — ABNORMAL LOW (ref 34.8–46.6)
HGB: 9.2 g/dL — ABNORMAL LOW (ref 11.6–15.9)
Immature Retic Fract: 10.5 % — ABNORMAL HIGH (ref 1.60–10.00)
LYMPH%: 77.7 % — ABNORMAL HIGH (ref 14.0–49.7)
MCH: 24.2 pg — AB (ref 25.1–34.0)
MCHC: 31.8 g/dL (ref 31.5–36.0)
MCV: 76 fL — ABNORMAL LOW (ref 79.5–101.0)
MONO#: 0.1 10*3/uL (ref 0.1–0.9)
MONO%: 0.5 % (ref 0.0–14.0)
NEUT#: 4.4 10*3/uL (ref 1.5–6.5)
NEUT%: 21 % — ABNORMAL LOW (ref 38.4–76.8)
PLATELETS: 281 10*3/uL (ref 145–400)
RBC: 3.81 10*6/uL (ref 3.70–5.45)
RDW: 17.6 % — AB (ref 11.2–14.5)
RETIC %: 1.52 % (ref 0.70–2.10)
Retic Ct Abs: 57.91 10*3/uL (ref 33.70–90.70)
WBC: 21 10*3/uL — ABNORMAL HIGH (ref 3.9–10.3)
lymph#: 16.3 10*3/uL — ABNORMAL HIGH (ref 0.9–3.3)

## 2017-04-08 LAB — IRON AND TIBC
%SAT: 8 % — AB (ref 21–57)
IRON: 24 ug/dL — AB (ref 41–142)
TIBC: 311 ug/dL (ref 236–444)
UIBC: 287 ug/dL (ref 120–384)

## 2017-04-08 LAB — TECHNOLOGIST REVIEW

## 2017-04-08 LAB — FERRITIN: FERRITIN: 24 ng/mL (ref 9–269)

## 2017-04-08 MED ORDER — PEG 3350-KCL-NA BICARB-NACL 420 G PO SOLR: 4000.0000 mL | ORAL | 0 refills | Status: DC

## 2017-04-08 NOTE — Progress Notes (Signed)
Theresa Kramer    HEMATOLOGY/ONCOLOGY CLINIC NOTE  Date of Service:  .04/08/2017  Patient Care Team: Asencion Noble, MD as PCP - General Rourk, [name], MD as Consulting Physician (Gastroenterology)  CHIEF COMPLAINTS/PURPOSE OF CONSULTATION:  F/u for B cell lymphoproliferative disorder.  HISTORY OF PRESENTING ILLNESS:  Theresa Kramer is a wonderful 76 y.o. female who has been referred to [place] by Dr Asencion Noble, MD for evaluation and management of lymphocytosis.  Patient has a h/o B12 def, depression, arthritis and DDD and was recently admitted to the hospital with TIA vs delirium in the setting of UTI. 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 She is here for further evaluation of her newly diagnosed B-cell leukemia/lymphoma. Patient reports no fevers. Has some night sweats but those have been in the setting of hypoglycemia. No overt LNadenopathy. No abdominal pain or distension. No reported weight loss. Has chronic fatigue that hasnt significantly changed recently.   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 Her daughter accompanies her. Patient notes no new constitutional symptoms. No fevers/chills/nightsweats. Note that she has been eating better but not very compliant with the PO iron intake. We discussed importance of taking the po Iron as per given prescription- Iron polysaccharide 150mg  po BID Also offered option to treatment with IV Iron. Patient chooses to give the po iron a proper attempt. No overt bleeding. No other acute new symptoms.  MEDICAL HISTORY:  Past Medical History:  Diagnosis Date  . Arthritis   . B12 deficiency   . Bronchitis    hx of  . Cancer ([place])   . Complication of anesthesia   . Depression    "not taking medication"  . Diabetes mellitus   . GERD (gastroesophageal reflux disease)   . H/O hiatal hernia     . Hyperlipidemia   . Hypertension   . Noncompliance with medication regimen   . Pinched cervical nerve root   . PONV (postoperative nausea and vomiting)   . Stroke ([place])   . Urinary tract infection    hx of    SURGICAL HISTORY: Past Surgical History:  Procedure Laterality Date  . ABDOMINAL HYSTERECTOMY    . ANTERIOR CERVICAL DECOMP/DISCECTOMY FUSION  03/15/2012   Procedure: ANTERIOR CERVICAL DECOMPRESSION/DISCECTOMY FUSION 2 LEVELS;  Surgeon: Ophelia Charter, MD;  Location: [facility];  Service: Neurosurgery;  Laterality: N/A;  Cervical four-five Cervical five six anterior cervical decompression with fusion interbody prothesis plating and bonegraft  . APPENDECTOMY    . BONE CYST EXCISION     from lower back and foot  . CHOLECYSTECTOMY    . COLONOSCOPY  2011   Dr. Gala Romney: prep compromised exam. diverticulosis, hemorrhoids. next tcs 2021  . DILATION AND CURETTAGE OF UTERUS    . 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 Multiple distal rings dilated and disrupted as described above. Small hiatal hernia.   Theresa Kramer EXCISION METACARPAL MASS Left 03/03/2016   Procedure: EXCISION OF LEFT  DORSAL MASS OF EXTENSOR TENDON;  Surgeon: Milly Jakob, MD;  Location: [facility];  Service: Orthopedics;  Laterality: Left;  . EYE SURGERY     bilateral cataract surgery,   . HIP ARTHROPLASTY     right  . JOINT REPLACEMENT    . KNEE SURGERY     torn cartilage repair  . MALONEY DILATION N/A  09/19/2014   Procedure: Venia Minks DILATION;  Surgeon: Daneil Dolin, MD;  [facility];  Service: Endoscopy;  Laterality: N/A;  . ORTHOPEDIC SURGERY    . ROTATOR CUFF REPAIR     "multiple in both shoulders" and total shoulder replacement in left arm  . SAVORY DILATION N/A 09/19/2014   Procedure: SAVORY DILATION;  Surgeon: Daneil Dolin, MD;  [facility];  Service: Endoscopy;  Laterality: N/A;  .  TONSILLECTOMY      SOCIAL HISTORY: Social History   Social History  . Marital status: Widowed    Spouse name: N/A  . Number of children: N/A  . Years of education: N/A   Occupational History  . Not on file.   Social History Main Topics  . Smoking status: Former Research scientist (life sciences)  . Smokeless tobacco: Never Used     Comment: stopped 25 years ago  . Alcohol use No  . Drug use: No  . Sexual activity: Not on file   Other Topics Concern  . Not on file   Social History Narrative  . No narrative on file    FAMILY HISTORY: Family History  Problem Relation Age of Onset  . Cirrhosis Brother        etoh  . Colon cancer Neg Hx     ALLERGIES:  is allergic to meloxicam.  MEDICATIONS:  Current Outpatient Prescriptions  Medication Sig Dispense Refill  . acetaminophen (TYLENOL ARTHRITIS PAIN) 650 MG CR tablet Take 650 mg by mouth 2 (two) times daily.    Theresa Kramer amLODipine (NORVASC) 5 MG tablet Take 5 mg by mouth daily.    . citalopram (CELEXA) 20 MG tablet Take 20 mg by mouth daily.      . clopidogrel (PLAVIX) 75 MG tablet TAKE 1 TABLET BY MOUTH  DAILY 90 tablet 3  . clotrimazole (LOTRIMIN) 1 % cream Apply 1 application topically 2 (two) times daily as needed. For rash     . cyanocobalamin (,VITAMIN B-12,) 1000 MCG/ML injection Inject 1,000 mcg into the muscle every 30 (thirty) days. Usually on the 1st     . ferrous sulfate 325 (65 FE) MG EC tablet Take 325 mg by mouth daily.     Theresa Kramer glipiZIDE (GLUCOTROL XL) 5 MG 24 hr tablet Take 5 mg by mouth daily.      . insulin glargine (LANTUS) 100 UNIT/ML injection Inject 55 Units into the skin at bedtime.     . insulin lispro (HUMALOG) 100 UNIT/ML injection Inject 8 Units into the skin 3 (three) times daily before meals.     Theresa Kramer LORazepam (ATIVAN) 0.5 MG tablet Take 0.5 mg by mouth 2 (two) times daily.    Theresa Kramer losartan-hydrochlorothiazide (HYZAAR) 100-12.5 MG tablet Take 1 tablet by mouth daily.    . metFORMIN (GLUCOPHAGE) 1000 MG tablet Take 500 mg by mouth 2  (two) times daily with a meal.     . oxybutynin (DITROPAN-XL) 10 MG 24 hr tablet Take 10 mg by mouth daily.     . pantoprazole (PROTONIX) 40 MG tablet Take 40 mg by mouth daily.    . polyethylene glycol-electrolytes (TRILYTE) 420 g solution Take 4,000 mLs by mouth as directed. 4000 mL 0  . pramipexole (MIRAPEX) 0.25 MG tablet Take 0.25 mg by mouth daily.      . pravastatin (PRAVACHOL) 80 MG tablet Take 1 tablet (80 mg total) by mouth daily. 30 tablet 3  . ranitidine (ZANTAC) 150 MG tablet Take 150 mg by mouth at bedtime.    Theresa Kramer  risperiDONE (RISPERDAL) 0.25 MG tablet Take 0.5 mg by mouth 2 (two) times daily.   3  . traMADol (ULTRAM) 50 MG tablet Take 1 tablet (50 mg total) by mouth every 6 (six) hours as needed for moderate pain. 20 tablet 0  . traZODone (DESYREL) 50 MG tablet TAKE 1/2 - 1 TABLET BY MOUTH AT BEDTIME AS NEEDED. takes 1 tablet at bedtime  1   No current facility-administered medications for this visit.     REVIEW OF SYSTEMS:    10 Point review of Systems was done is negative except as noted above.  PHYSICAL EXAMINATION: ECOG PERFORMANCE STATUS: 2 - Symptomatic, <50% confined to bed  . Vitals:   04/08/17 0924  BP: (!) 145/45  Pulse: 90  Resp: 18  Temp: 98.9 F (37.2 C)   Filed Weights   04/08/17 0924  Weight: 221 lb 1.6 oz (100.3 kg)   .Body mass index is 36.79 kg/m.  GENERAL:alert, in no acute distress and comfortable SKIN: skin color, texture, turgor are normal, no rashes or significant lesions EYES: normal, conjunctiva are pink and non-injected, sclera clear OROPHARYNX:no exudate, no erythema and lips, buccal mucosa, and tongue normal  NECK: supple, no JVD, thyroid normal size, non-tender, without nodularity LYMPH:  no palpable lymphadenopathy in the cervical, axillary or inguinal LUNGS: clear to auscultation with normal respiratory effort HEART: regular rate & rhythm,  no murmurs and no lower extremity edema ABDOMEN: abdomen soft, non-tender, normoactive  bowel sounds , no palpable hepatosplenomegaly. Musculoskeletal: no cyanosis of digits and no clubbing  PSYCH: alert & oriented x 3 with fluent speech NEURO: no focal motor/sensory deficits  LABORATORY DATA:  I have reviewed the data as listed  . CBC Latest Ref Rng & Units 04/08/2017 01/05/2017 11/24/2016  WBC 3.9 - 10.3 10e3/uL 21.0(H) 18.8(H) 18.1(H)  Hemoglobin 11.6 - 15.9 g/dL 9.2(L) 9.2(L) 9.4(L)  Hematocrit 34.8 - 46.6 % 29.0(L) 28.7(L) 29.3(L)  Platelets 145 - 400 10e3/uL 281 330 330   . CBC    Component Value Date/Time   WBC 21.0 (H) 04/08/2017 0905   WBC 24.4 (H) 08/04/2016 0641   RBC 3.81 04/08/2017 0905   RBC 4.16 08/04/2016 0641   HGB 9.2 (L) 04/08/2017 0905   HCT 29.0 (L) 04/08/2017 0905   PLT 281 04/08/2017 0905   MCV 76.0 (L) 04/08/2017 0905   MCH 24.2 (L) 04/08/2017 0905   MCH 25.5 (L) 08/04/2016 0641   MCHC 31.8 04/08/2017 0905   MCHC 30.5 08/04/2016 0641   RDW 17.6 (H) 04/08/2017 0905   LYMPHSABS 16.3 (H) 04/08/2017 0905   MONOABS 0.1 04/08/2017 0905   EOSABS 0.1 04/08/2017 0905   BASOSABS 0.0 04/08/2017 0905     . CMP Latest Ref Rng & Units 01/05/2017 11/24/2016 11/17/2016  Glucose 70 - 140 mg/dl 131 228(H) 108  BUN 7.0 - 26.0 mg/dL 30.0(H) 20.4 21.6  Creatinine 0.6 - 1.1 mg/dL 1.2(H) 1.4(H) 1.0  Sodium 136 - 145 mEq/L 141 140 143  Potassium 3.5 - 5.1 mEq/L 4.4 4.4 4.1  Chloride 101 - 111 mmol/L - - -  CO2 22 - 29 mEq/L 22 23 25   Calcium 8.4 - 10.4 mg/dL 9.4 9.2 9.6  Total Protein 6.4 - 8.3 g/dL 7.6 7.8 7.8  Total Bilirubin 0.20 - 1.20 mg/dL 0.28 0.34 0.32  Alkaline Phos 40 - 150 U/L 79 90 91  AST 5 - 34 U/L 14 14 19   ALT 0 - 55 U/L 14 15 18     Lab Results  Component Value  Date   IRON 24 (L) 04/08/2017   TIBC 311 04/08/2017   IRONPCTSAT 8 (L) 04/08/2017   (Iron and TIBC)  Lab Results  Component Value Date   FERRITIN 24 04/08/2017    Lab Results  Component Value Date   LDH 203 01/05/2017          RADIOGRAPHIC STUDIES: I have  personally reviewed the radiological images as listed and agreed with the findings in the report. No results found.  PET/CT 10/25/017:  IMPRESSION: 1. Mildly enlarged and mildly hypermetabolic spleen, consistent with the provided history of a lymphoproliferative disorder. 2. No hypermetabolic lymphadenopathy or other hypermetabolic sites of disease. 3. Additional findings include aortic atherosclerosis, three-vessel coronary atherosclerosis, small hiatal hernia and small fat containing left inguinal hernia.   Electronically Signed   By: Ilona Sorrel M.D.   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 Noted to have elevated wbc/lymphocyte counts end of September. Unknown if she had a more chronic elevation of her WBC counts that might put a timeline on her lymphocytosis.  Differential diagnosis- CD5 neg variant of CLL (about 17-20% patients with CLL could be CD5neg) vs B-cell leukemia/lymphoma  PET/CT with no significant LNadenopathy and PET/CT with minimal enlarged and minimally hypermetabolic. FISH shows Trisomy 47 which would be consistent with CLL LDH WNL  Patient WBC is at 21k up from 18k with around 16.3k lymphocytes. LDH WNL Features consistent with indolent chronic lymphoproliferative process - likely CLL   2) Anemia - microcytic likely related to iron deficiency and less likely to be related to CLL. . 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 Brings up the possibility of lymphoplasmacytic lymphoma however trisomy 12 is typical for CLL and  uncommon in LPL/WM PLAN -patient has been non compliant with the po iron and remains Iron deficient. -she was given the option to replace the iron IV but declines this option currently -she notes that he will take the prescribed Iron polysaccharide 150mg  po BID -f/u with PCP to determine need for GI work for Iron deficiency -no indication for treatment of patients CLL at this time, though if anemia persists after correction of Iron deficiency mightneed additional assessment including consideration of BM Bx.  (patient is very hesitant regarding this and would like to avoid this if possible).  4). Patient Active Problem List   Diagnosis Date Noted  . IDA (iron deficiency anemia) 03/25/2017  . Somnolence, daytime 12/31/2016  . Snoring 12/31/2016  . Occipital neuralgia   . Acute ischemic stroke ([place])   . Stroke ([place])   . 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 Leukocytosis 08/01/2016  . Urinary tract infectious disease 08/01/2016  . Type 2 diabetes mellitus without complication, with long-term current use of insulin ([place]) 07/30/2016  . Essential hypertension 07/30/2016  . Osteoarthritis 07/30/2016  . Normocytic anemia 07/30/2016  . HLD (hyperlipidemia)   . Schatzki's ring   . Cervical herniated disc 03/15/2012  .  GERD 04/29/2010  . CHEST PAIN, ATYPICAL 04/29/2010  . DYSPHAGIA UNSPECIFIED 04/29/2010  -continue rf/u with PCP.    RTC with Dr Irene Limbo in 3 months with labs  All of the patients questions were answered with apparent satisfaction. The patient knows to call the clinic with any problems, questions or concerns.   I spent 20 minutes counseling the patient face to face. The total time spent in the appointment was 25 minutes and more than 50% was on counseling and direct patient cares.    Sullivan Lone MD [place] AAHIVMS [facility] Hematology/Oncology Physician [facility]  (Office):        [phone] (Work cell):  [phone] (Fax):           [phone]

## 2017-04-08 NOTE — Telephone Encounter (Signed)
Appointments scheduled per 04/08/17 los. Patient was given a copy of the AVS report and appointment schedule, per 06/006/18 los.

## 2017-04-08 NOTE — Patient Instructions (Signed)
Thank you for choosing [facility] to provide your oncology and hematology care.  To afford each patient quality time with our providers, please arrive 30 minutes before your scheduled appointment time.  If you arrive late for your appointment, you may be asked to reschedule.  We strive to give you quality time with our providers, and arriving late affects you and other patients whose appointments are after yours.   If you are a no show for multiple scheduled visits, you may be dismissed from the clinic at the providers discretion.    Again, thank you for choosing [facility], our hope is that these requests will decrease the amount of time that you wait before being seen by our physicians.  ______________________________________________________________________  Should you have questions after your visit to the [facility], please contact our office at [phone] between the hours of 8:30 and 4:30 p.m.    Voicemails left after 4:30p.m will not be returned until the following business day.    For prescription refill requests, please have your pharmacy contact us directly.  Please also try to allow 48 hours for prescription requests.    Please contact the scheduling department for questions regarding scheduling.  For scheduling of procedures such as PET scans, CT scans, MRI, Ultrasound, etc please contact central scheduling at [phone].    Resources For Cancer Patients and Caregivers:   Oncolink.org:  A wonderful resource for patients and healthcare providers for information regarding your disease, ways to tract your treatment, what to expect, etc.     American Cancer Society:  800-227-2345  Can help patients locate various types of support and financial assistance  Cancer Care: 1-800-813-HOPE (4673) Provides financial assistance, online support groups, medication/co-pay assistance.    [place] DSS:  336-641-3447 Where to apply for food  stamps, Medicaid, and utility assistance  Medicare Rights Center: 800-333-4114 Helps people with Medicare understand their rights and benefits, navigate the Medicare system, and secure the quality healthcare they deserve  SCAT: 336-333-6589 [place] [employer]'s shared-ride transportation service for eligible riders who have a disability that prevents them from riding the fixed route bus.    For additional information on assistance programs please contact our social worker:   Grier Hock/Abigail Elmore:  [phone]            

## 2017-04-09 LAB — VITAMIN B12: Vitamin B12: 858 pg/mL (ref 232–1245)

## 2017-04-12 LAB — MULTIPLE MYELOMA PANEL, SERUM
ALBUMIN/GLOB SERPL: 1.1 (ref 0.7–1.7)
ALPHA 1: 0.2 g/dL (ref 0.0–0.4)
Albumin SerPl Elph-Mcnc: 3.5 g/dL (ref 2.9–4.4)
Alpha2 Glob SerPl Elph-Mcnc: 1.3 g/dL — ABNORMAL HIGH (ref 0.4–1.0)
B-Globulin SerPl Elph-Mcnc: 1 g/dL (ref 0.7–1.3)
GAMMA GLOB SERPL ELPH-MCNC: 0.8 g/dL (ref 0.4–1.8)
GLOBULIN, TOTAL: 3.3 g/dL (ref 2.2–3.9)
IGA/IMMUNOGLOBULIN A, SERUM: 140 mg/dL (ref 64–422)
IGM (IMMUNOGLOBIN M), SRM: 396 mg/dL — AB (ref 26–217)
IgG, Qn, Serum: 638 mg/dL — ABNORMAL LOW (ref 700–1600)
M Protein SerPl Elph-Mcnc: 0.4 g/dL — ABNORMAL HIGH
Total Protein: 6.8 g/dL (ref 6.0–8.5)

## 2017-05-01 NOTE — Patient Instructions (Signed)
Theresa Kramer  05/01/2017     @PREFPERIOPPHARMACY @   Your procedure is scheduled on  05/18/2017   Report to [facility] at  21 AM     Call this number if you have problems the morning of surgery:  [phone]   Remember:  Do not eat food or drink liquids after midnight.  Take these medicines the morning of surgery with A SIP OF WATER  Amlodipine, celexa, ativan, ditropan, protonix, risperadal, ultram. Take 1/2 of your usual insulin dosage the night before your procedure. DO NOT take any medications for diabetes the morning of your procedure.   Do not wear jewelry, make-up or nail polish.  Do not wear lotions, powders, or perfumes, or deoderant.  Do not shave 48 hours prior to surgery.  Men may shave face and neck.  Do not bring valuables to the hospital.  [facility] is not responsible for any belongings or valuables.  Contacts, dentures or bridgework may not be worn into surgery.  Leave your suitcase in the car.  After surgery it may be brought to your room.  For patients admitted to the hospital, discharge time will be determined by your treatment team.  Patients discharged the day of surgery will not be allowed to drive home.   Name and phone number of your driver:   family Special instructions:  Follow the diet and prep instructions given to you by Dr Roseanne Kaufman office.  Please read over the following fact sheets that you were given. Anesthesia Post-op Instructions and Care and Recovery After Surgery       Esophagogastroduodenoscopy Esophagogastroduodenoscopy (EGD) is a procedure to examine the lining of the esophagus, stomach, and first part of the small intestine (duodenum). This procedure is done to check for problems such as inflammation, bleeding, ulcers, or growths. During this procedure, a long, flexible, lighted tube with a camera attached (endoscope) is inserted down the throat. Tell a health care provider about:  Any allergies you  have.  All medicines you are taking, including vitamins, herbs, eye drops, creams, and over-the-counter medicines.  Any problems you or family members have had with anesthetic medicines.  Any blood disorders you have.  Any surgeries you have had.  Any medical conditions you have.  Whether you are pregnant or may be pregnant. What are the risks? Generally, this is a safe procedure. However, problems may occur, including:  Infection.  Bleeding.  A tear (perforation) in the esophagus, stomach, or duodenum.  Trouble breathing.  Excessive sweating.  Spasms of the larynx.  A slowed heartbeat.  Low blood pressure.  What happens before the procedure?  Follow instructions from your health care provider about eating or drinking restrictions.  Ask your health care provider about: ? Changing or stopping your regular medicines. This is especially important if you are taking diabetes medicines or blood thinners. ? Taking medicines such as aspirin and ibuprofen. These medicines can thin your blood. Do not take these medicines before your procedure if your health care provider instructs you not to.  Plan to have someone take you home after the procedure.  If you wear dentures, be ready to remove them before the procedure. What happens during the procedure?  To reduce your risk of infection, your health care team will wash or sanitize their hands.  An IV tube will be put in a vein in your hand or arm. You will get medicines and fluids through  this tube.  You will be given one or more of the following: ? A medicine to help you relax (sedative). ? A medicine to numb the area (local anesthetic). This medicine may be sprayed into your throat. It will make you feel more comfortable and keep you from gagging or coughing during the procedure. ? A medicine for pain.  A mouth guard may be placed in your mouth to protect your teeth and to keep you from biting on the endoscope.  You will  be asked to lie on your left side.  The endoscope will be lowered down your throat into your esophagus, stomach, and duodenum.  Air will be put into the endoscope. This will help your health care provider see better.  The lining of your esophagus, stomach, and duodenum will be examined.  Your health care provider may: ? Take a tissue sample so it can be looked at in a lab (biopsy). ? Remove growths. ? Remove objects (foreign bodies) that are stuck. ? Treat any bleeding with medicines or other devices that stop tissue from bleeding. ? Widen (dilate) or stretch narrowed areas of your esophagus and stomach.  The endoscope will be taken out. The procedure may vary among health care providers and hospitals. What happens after the procedure?  Your blood pressure, heart rate, breathing rate, and blood oxygen level will be monitored often until the medicines you were given have worn off.  Do not eat or drink anything until the numbing medicine has worn off and your gag reflex has returned. This information is not intended to replace advice given to you by your health care provider. Make sure you discuss any questions you have with your health care provider. Document Released: 02/20/2005 Document Revised: 03/27/2016 Document Reviewed: 09/13/2015 Elsevier Interactive Patient Education  2018 [employer]. Esophagogastroduodenoscopy, Care After Refer to this sheet in the next few weeks. These instructions provide you with information about caring for yourself after your procedure. Your health care provider may also give you more specific instructions. Your treatment has been planned according to current medical practices, but problems sometimes occur. Call your health care provider if you have any problems or questions after your procedure. What can I expect after the procedure? After the procedure, it is common to have:  A sore throat.  Nausea.  Bloating.  Dizziness.  Fatigue.  Follow  these instructions at home:  Do not eat or drink anything until the numbing medicine (local anesthetic) has worn off and your gag reflex has returned. You will know that the local anesthetic has worn off when you can swallow comfortably.  Do not drive for 24 hours if you received a medicine to help you relax (sedative).  If your health care provider took a tissue sample for testing during the procedure, make sure to get your test results. This is your responsibility. Ask your health care provider or the department performing the test when your results will be ready.  Keep all follow-up visits as told by your health care provider. This is important. Contact a health care provider if:  You cannot stop coughing.  You are not urinating.  You are urinating less than usual. Get help right away if:  You have trouble swallowing.  You cannot eat or drink.  You have throat or chest pain that gets worse.  You are dizzy or light-headed.  You faint.  You have nausea or vomiting.  You have chills.  You have a fever.  You have severe abdominal pain.  You have black, tarry, or bloody stools. This information is not intended to replace advice given to you by your health care provider. Make sure you discuss any questions you have with your health care provider. Document Released: 10/06/2012 Document Revised: 03/27/2016 Document Reviewed: 09/13/2015 Elsevier Interactive Patient Education  2018 [employer].  Colonoscopy, Adult A colonoscopy is an exam to look at the entire large intestine. During the exam, a lubricated, bendable tube is inserted into the anus and then passed into the rectum, colon, and other parts of the large intestine. A colonoscopy is often done as a part of normal colorectal screening or in response to certain symptoms, such as anemia, persistent diarrhea, abdominal pain, and blood in the stool. The exam can help screen for and diagnose medical problems,  including:  Tumors.  Polyps.  Inflammation.  Areas of bleeding.  Tell a health care provider about:  Any allergies you have.  All medicines you are taking, including vitamins, herbs, eye drops, creams, and over-the-counter medicines.  Any problems you or family members have had with anesthetic medicines.  Any blood disorders you have.  Any surgeries you have had.  Any medical conditions you have.  Any problems you have had passing stool. What are the risks? Generally, this is a safe procedure. However, problems may occur, including:  Bleeding.  A tear in the intestine.  A reaction to medicines given during the exam.  Infection (rare).  What happens before the procedure? Eating and drinking restrictions Follow instructions from your health care provider about eating and drinking, which may include:  A few days before the procedure - follow a low-fiber diet. Avoid nuts, seeds, dried fruit, raw fruits, and vegetables.  1-3 days before the procedure - follow a clear liquid diet. Drink only clear liquids, such as clear broth or bouillon, black coffee or tea, clear juice, clear soft drinks or sports drinks, gelatin dessert, and popsicles. Avoid any liquids that contain red or purple dye.  On the day of the procedure - do not eat or drink anything during the 2 hours before the procedure, or within the time period that your health care provider recommends.  Bowel prep If you were prescribed an oral bowel prep to clean out your colon:  Take it as told by your health care provider. Starting the day before your procedure, you will need to drink a large amount of medicated liquid. The liquid will cause you to have multiple loose stools until your stool is almost clear or light green.  If your skin or anus gets irritated from diarrhea, you may use these to relieve the irritation: ? Medicated wipes, such as adult wet wipes with aloe and vitamin E. ? A skin soothing-product like  petroleum jelly.  If you vomit while drinking the bowel prep, take a break for up to 60 minutes and then begin the bowel prep again. If vomiting continues and you cannot take the bowel prep without vomiting, call your health care provider.  General instructions  Ask your health care provider about changing or stopping your regular medicines. This is especially important if you are taking diabetes medicines or blood thinners.  Plan to have someone take you home from the hospital or clinic. What happens during the procedure?  An IV tube may be inserted into one of your veins.  You will be given medicine to help you relax (sedative).  To reduce your risk of infection: ? Your health care team will wash or sanitize their hands. ?  Your anal area will be washed with soap.  You will be asked to lie on your side with your knees bent.  Your health care provider will lubricate a long, thin, flexible tube. The tube will have a camera and a light on the end.  The tube will be inserted into your anus.  The tube will be gently eased through your rectum and colon.  Air will be delivered into your colon to keep it open. You may feel some pressure or cramping.  The camera will be used to take images during the procedure.  A small tissue sample may be removed from your body to be examined under a microscope (biopsy). If any potential problems are found, the tissue will be sent to a lab for testing.  If small polyps are found, your health care provider may remove them and have them checked for cancer cells.  The tube that was inserted into your anus will be slowly removed. The procedure may vary among health care providers and hospitals. What happens after the procedure?  Your blood pressure, heart rate, breathing rate, and blood oxygen level will be monitored until the medicines you were given have worn off.  Do not drive for 24 hours after the exam.  You may have a small amount of blood in  your stool.  You may pass gas and have mild abdominal cramping or bloating due to the air that was used to inflate your colon during the exam.  It is up to you to get the results of your procedure. Ask your health care provider, or the department performing the procedure, when your results will be ready. This information is not intended to replace advice given to you by your health care provider. Make sure you discuss any questions you have with your health care provider. Document Released: 10/17/2000 Document Revised: 08/20/2016 Document Reviewed: 01/01/2016 Elsevier Interactive Patient Education  2018 [employer].  Colonoscopy, Adult, Care After This sheet gives you information about how to care for yourself after your procedure. Your health care provider may also give you more specific instructions. If you have problems or questions, contact your health care provider. What can I expect after the procedure? After the procedure, it is common to have:  A small amount of blood in your stool for 24 hours after the procedure.  Some gas.  Mild abdominal cramping or bloating.  Follow these instructions at home: General instructions   For the first 24 hours after the procedure: ? Do not drive or use machinery. ? Do not sign important documents. ? Do not drink alcohol. ? Do your regular daily activities at a slower pace than normal. ? Eat soft, easy-to-digest foods. ? Rest often.  Take over-the-counter or prescription medicines only as told by your health care provider.  It is up to you to get the results of your procedure. Ask your health care provider, or the department performing the procedure, when your results will be ready. Relieving cramping and bloating  Try walking around when you have cramps or feel bloated.  Apply heat to your abdomen as told by your health care provider. Use a heat source that your health care provider recommends, such as a moist heat pack or a heating  pad. ? Place a towel between your skin and the heat source. ? Leave the heat on for 20-30 minutes. ? Remove the heat if your skin turns bright red. This is especially important if you are unable to feel pain, heat, or  cold. You may have a greater risk of getting burned. Eating and drinking  Drink enough fluid to keep your urine clear or pale yellow.  Resume your normal diet as instructed by your health care provider. Avoid heavy or fried foods that are hard to digest.  Avoid drinking alcohol for as long as instructed by your health care provider. Contact a health care provider if:  You have blood in your stool 2-3 days after the procedure. Get help right away if:  You have more than a small spotting of blood in your stool.  You pass large blood clots in your stool.  Your abdomen is swollen.  You have nausea or vomiting.  You have a fever.  You have increasing abdominal pain that is not relieved with medicine. This information is not intended to replace advice given to you by your health care provider. Make sure you discuss any questions you have with your health care provider. Document Released: 06/03/2004 Document Revised: 07/14/2016 Document Reviewed: 01/01/2016 Elsevier Interactive Patient Education  2018 [place] Anesthesia is a term that refers to techniques, procedures, and medicines that help a person stay safe and comfortable during a medical procedure. Monitored anesthesia care, or sedation, is one type of anesthesia. Your anesthesia specialist may recommend sedation if you will be having a procedure that does not require you to be unconscious, such as:  Cataract surgery.  A dental procedure.  A biopsy.  A colonoscopy.  During the procedure, you may receive a medicine to help you relax (sedative). There are three levels of sedation:  Mild sedation. At this level, you may feel awake and relaxed. You will be able to follow  directions.  Moderate sedation. At this level, you will be sleepy. You may not remember the procedure.  Deep sedation. At this level, you will be asleep. You will not remember the procedure.  The more medicine you are given, the deeper your level of sedation will be. Depending on how you respond to the procedure, the anesthesia specialist may change your level of sedation or the type of anesthesia to fit your needs. An anesthesia specialist will monitor you closely during the procedure. Let your health care provider know about:  Any allergies you have.  All medicines you are taking, including vitamins, herbs, eye drops, creams, and over-the-counter medicines.  Any use of steroids (by mouth or as a cream).  Any problems you or family members have had with sedatives and anesthetic medicines.  Any blood disorders you have.  Any surgeries you have had.  Any medical conditions you have, such as sleep apnea.  Whether you are pregnant or may be pregnant.  Any use of cigarettes, alcohol, or street drugs. What are the risks? Generally, this is a safe procedure. However, problems may occur, including:  Getting too much medicine (oversedation).  Nausea.  Allergic reaction to medicines.  Trouble breathing. If this happens, a breathing tube may be used to help with breathing. It will be removed when you are awake and breathing on your own.  Heart trouble.  Lung trouble.  Before the procedure Staying hydrated Follow instructions from your health care provider about hydration, which may include:  Up to 2 hours before the procedure - you may continue to drink clear liquids, such as water, clear fruit juice, black coffee, and plain tea.  Eating and drinking restrictions Follow instructions from your health care provider about eating and drinking, which may include:  8 hours before the  procedure - stop eating heavy meals or foods such as meat, fried foods, or fatty foods.  6 hours  before the procedure - stop eating light meals or foods, such as toast or cereal.  6 hours before the procedure - stop drinking milk or drinks that contain milk.  2 hours before the procedure - stop drinking clear liquids.  Medicines Ask your health care provider about:  Changing or stopping your regular medicines. This is especially important if you are taking diabetes medicines or blood thinners.  Taking medicines such as aspirin and ibuprofen. These medicines can thin your blood. Do not take these medicines before your procedure if your health care provider instructs you not to.  Tests and exams  You will have a physical exam.  You may have blood tests done to show: ? How well your kidneys and liver are working. ? How well your blood can clot.  General instructions  Plan to have someone take you home from the hospital or clinic.  If you will be going home right after the procedure, plan to have someone with you for 24 hours.  What happens during the procedure?  Your blood pressure, heart rate, breathing, level of pain and overall condition will be monitored.  An IV tube will be inserted into one of your veins.  Your anesthesia specialist will give you medicines as needed to keep you comfortable during the procedure. This may mean changing the level of sedation.  The procedure will be performed. After the procedure  Your blood pressure, heart rate, breathing rate, and blood oxygen level will be monitored until the medicines you were given have worn off.  Do not drive for 24 hours if you received a sedative.  You may: ? Feel sleepy, clumsy, or nauseous. ? Feel forgetful about what happened after the procedure. ? Have a sore throat if you had a breathing tube during the procedure. ? Vomit. This information is not intended to replace advice given to you by your health care provider. Make sure you discuss any questions you have with your health care provider. Document  Released: 07/16/2005 Document Revised: 03/28/2016 Document Reviewed: 02/10/2016 Elsevier Interactive Patient Education  2018 [place], Care After These instructions provide you with information about caring for yourself after your procedure. Your health care provider may also give you more specific instructions. Your treatment has been planned according to current medical practices, but problems sometimes occur. Call your health care provider if you have any problems or questions after your procedure. What can I expect after the procedure? After your procedure, it is common to:  Feel sleepy for several hours.  Feel clumsy and have poor balance for several hours.  Feel forgetful about what happened after the procedure.  Have poor judgment for several hours.  Feel nauseous or vomit.  Have a sore throat if you had a breathing tube during the procedure.  Follow these instructions at home: For at least 24 hours after the procedure:   Do not: ? Participate in activities in which you could fall or become injured. ? Drive. ? Use heavy machinery. ? Drink alcohol. ? Take sleeping pills or medicines that cause drowsiness. ? Make important decisions or sign legal documents. ? Take care of children on your own.  Rest. Eating and drinking  Follow the diet that is recommended by your health care provider.  If you vomit, drink water, juice, or soup when you can drink without vomiting.  Make sure you  have little or no nausea before eating solid foods. General instructions  Have a responsible adult stay with you until you are awake and alert.  Take over-the-counter and prescription medicines only as told by your health care provider.  If you smoke, do not smoke without supervision.  Keep all follow-up visits as told by your health care provider. This is important. Contact a health care provider if:  You keep feeling nauseous or you keep  vomiting.  You feel light-headed.  You develop a rash.  You have a fever. Get help right away if:  You have trouble breathing. This information is not intended to replace advice given to you by your health care provider. Make sure you discuss any questions you have with your health care provider. Document Released: 02/10/2016 Document Revised: 06/11/2016 Document Reviewed: 02/10/2016 Elsevier Interactive Patient Education  [employer].

## 2017-05-08 ENCOUNTER — Encounter: Payer: Self-pay | Admitting: Hematology

## 2017-05-12 IMAGING — PT NM PET TUM IMG INITIAL (PI) SKULL BASE T - THIGH
1 of 8 series · 1 of 25 positions shown · non-contrast
Comparison: 11/10/2011 CT abdomen/ pelvis and 10/05/2010 chest CT
angiogram.

CLINICAL DATA: Initial treatment strategy for monoclonal B-cell
lymphoproliferative disorder.

EXAM:
NUCLEAR MEDICINE PET SKULL BASE TO THIGH
TECHNIQUE: 10.8 mCi F-18 FDG was injected intravenously. Full-ring PET imaging
was performed from the skull base to thigh after the radiotracer. CT
data was obtained and used for attenuation correction and anatomic
localization.
FASTING BLOOD GLUCOSE:  Value: 68 mg/dl

[Series 4: ct sk_thigh 5.0 b31f · axial · 5.0mm · 0.98mm/px · 1 of 220 slices shown]
[im 220/220  brain]
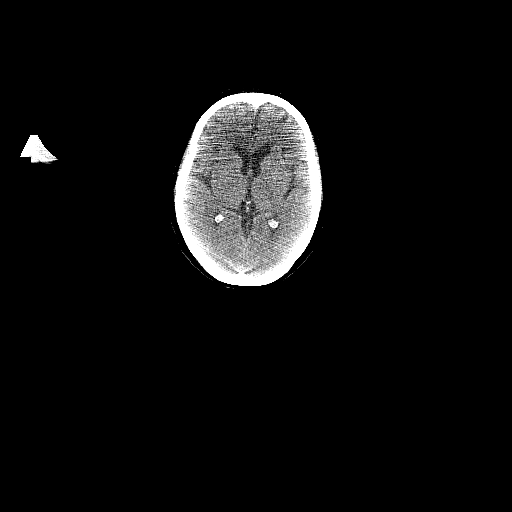

[1 of 25 positions shown; findings below may reference images not displayed]

FINDINGS: NECK

No hypermetabolic lymph nodes in the neck.

CHEST

Left anterior descending, left circumflex and right coronary
atherosclerosis. Atherosclerotic nonaneurysmal thoracic aorta. No
pleural effusions. Right upper lobe 4 mm pulmonary nodule (series
8/image 22) is stable since 10/05/2010 and considered benign. No
acute consolidative airspace disease, lung masses or additional
significant pulmonary nodules.

ABDOMEN/PELVIS

Spleen is mildly enlarged and mildly hypermetabolic (craniocaudal
splenic length 15.7 cm with max SUV 5.8 in the spleen compared to
the liver max SUV 4.6). No splenic mass.

No abnormal hypermetabolic activity within the liver, pancreas or
adrenal glands. No hypermetabolic lymph nodes in the abdomen or
pelvis. Small hiatal hernia. There a few scattered granulomatous
calcifications throughout the liver and spleen. Hypermetabolism
throughout the stomach, small bowel and large bowel without CT
correlate is considered physiologic. Atherosclerotic nonaneurysmal
abdominal aorta. Small fat containing left inguinal hernia.
Hysterectomy.

SKELETON

No focal hypermetabolic activity to suggest skeletal metastasis.
Surgical hardware from ACDF is seen in the lower cervical spine.
Right total hip arthroplasty. Left total shoulder arthroplasty.
IMPRESSION: 1. Mildly enlarged and mildly hypermetabolic spleen, consistent with
the provided history of a lymphoproliferative disorder.
2. No hypermetabolic lymphadenopathy or other hypermetabolic sites
of disease.
3. Additional findings include aortic atherosclerosis, three-vessel
coronary atherosclerosis, small hiatal hernia and small fat
containing left inguinal hernia.

## 2017-05-13 ENCOUNTER — Encounter (HOSPITAL_COMMUNITY)
Admission: RE | Admit: 2017-05-13 | Discharge: 2017-05-13 | Disposition: A | Discharge status: Home / Self Care | Payer: Medicare Other | Source: Ambulatory Visit | Attending: Internal Medicine | Admitting: Internal Medicine

## 2017-05-13 ENCOUNTER — Encounter (HOSPITAL_COMMUNITY): Payer: Self-pay

## 2017-05-13 DIAGNOSIS — Z01812 Encounter for preprocedural laboratory examination: Secondary | ICD-10-CM | POA: Diagnosis not present

## 2017-05-13 LAB — BASIC METABOLIC PANEL
Anion gap: 11 (ref 5–15)
BUN: 28 mg/dL — ABNORMAL HIGH (ref 6–20)
CHLORIDE: 100 mmol/L — AB (ref 101–111)
CO2: 27 mmol/L (ref 22–32)
Calcium: 9.3 mg/dL (ref 8.9–10.3)
Creatinine, Ser: 1 mg/dL (ref 0.44–1.00)
GFR calc non Af Amer: 53 mL/min — ABNORMAL LOW (ref >60)
Glucose, Bld: 147 mg/dL — ABNORMAL HIGH (ref 65–99)
POTASSIUM: 4.2 mmol/L (ref 3.5–5.1)
SODIUM: 138 mmol/L (ref 135–145)

## 2017-05-13 LAB — CBC
HEMATOCRIT: 31.9 % — AB (ref 36.0–46.0)
HEMOGLOBIN: 9.6 g/dL — AB (ref 12.0–15.0)
MCH: 24.2 pg — ABNORMAL LOW (ref 26.0–34.0)
MCHC: 30.1 g/dL (ref 30.0–36.0)
MCV: 80.6 fL (ref 78.0–100.0)
Platelets: 324 10*3/uL (ref 150–400)
RBC: 3.96 MIL/uL (ref 3.87–5.11)
RDW: 17.1 % — ABNORMAL HIGH (ref 11.5–15.5)
WBC: 24.1 10*3/uL — ABNORMAL HIGH (ref 4.0–10.5)

## 2017-05-13 NOTE — Pre-Procedure Instructions (Signed)
Daughter states that she did not get prep instructions at office visit and was advised that we would instruct her on prep. Letter printed with instructions.  As I was going over them, daughter states that she will not be able to drink prep, as she cannot even take cough syrup without vomiting.  Advised her to contact office and discuss this issue, as the colonoscopy could not be performed without some type of prep.  Daughter verbalized understanding.  Notified Ginger at [facility] of above.

## 2017-05-18 ENCOUNTER — Ambulatory Visit (HOSPITAL_COMMUNITY)
Admission: RE | Admit: 2017-05-18 | Discharge: 2017-05-18 | Disposition: A | Discharge status: Home / Self Care | Payer: Medicare Other | Source: Ambulatory Visit | Attending: Internal Medicine | Admitting: Internal Medicine

## 2017-05-18 ENCOUNTER — Encounter (HOSPITAL_COMMUNITY)
Admission: RE | Disposition: A | Discharge status: Home / Self Care | Payer: Self-pay | Source: Ambulatory Visit | Attending: Internal Medicine

## 2017-05-18 ENCOUNTER — Ambulatory Visit (HOSPITAL_COMMUNITY): Payer: Medicare Other | Admitting: Anesthesiology

## 2017-05-18 ENCOUNTER — Encounter (HOSPITAL_COMMUNITY): Payer: Self-pay

## 2017-05-18 DIAGNOSIS — K219 Gastro-esophageal reflux disease without esophagitis: Secondary | ICD-10-CM | POA: Insufficient documentation

## 2017-05-18 DIAGNOSIS — Z9049 Acquired absence of other specified parts of digestive tract: Secondary | ICD-10-CM | POA: Diagnosis not present

## 2017-05-18 DIAGNOSIS — Z981 Arthrodesis status: Secondary | ICD-10-CM | POA: Insufficient documentation

## 2017-05-18 DIAGNOSIS — K222 Esophageal obstruction: Secondary | ICD-10-CM

## 2017-05-18 DIAGNOSIS — D509 Iron deficiency anemia, unspecified: Secondary | ICD-10-CM | POA: Diagnosis not present

## 2017-05-18 DIAGNOSIS — Z9114 Patient's other noncompliance with medication regimen: Secondary | ICD-10-CM | POA: Insufficient documentation

## 2017-05-18 DIAGNOSIS — Z794 Long term (current) use of insulin: Secondary | ICD-10-CM | POA: Diagnosis not present

## 2017-05-18 DIAGNOSIS — Z8379 Family history of other diseases of the digestive system: Secondary | ICD-10-CM | POA: Insufficient documentation

## 2017-05-18 DIAGNOSIS — K449 Diaphragmatic hernia without obstruction or gangrene: Secondary | ICD-10-CM | POA: Diagnosis not present

## 2017-05-18 DIAGNOSIS — E538 Deficiency of other specified B group vitamins: Secondary | ICD-10-CM | POA: Insufficient documentation

## 2017-05-18 DIAGNOSIS — Z87440 Personal history of urinary (tract) infections: Secondary | ICD-10-CM | POA: Diagnosis not present

## 2017-05-18 DIAGNOSIS — K5730 Diverticulosis of large intestine without perforation or abscess without bleeding: Secondary | ICD-10-CM | POA: Insufficient documentation

## 2017-05-18 DIAGNOSIS — Z87891 Personal history of nicotine dependence: Secondary | ICD-10-CM | POA: Insufficient documentation

## 2017-05-18 DIAGNOSIS — I10 Essential (primary) hypertension: Secondary | ICD-10-CM | POA: Insufficient documentation

## 2017-05-18 DIAGNOSIS — D124 Benign neoplasm of descending colon: Secondary | ICD-10-CM

## 2017-05-18 DIAGNOSIS — Z886 Allergy status to analgesic agent status: Secondary | ICD-10-CM | POA: Insufficient documentation

## 2017-05-18 DIAGNOSIS — K3189 Other diseases of stomach and duodenum: Secondary | ICD-10-CM

## 2017-05-18 DIAGNOSIS — Z96641 Presence of right artificial hip joint: Secondary | ICD-10-CM | POA: Diagnosis not present

## 2017-05-18 DIAGNOSIS — D123 Benign neoplasm of transverse colon: Secondary | ICD-10-CM

## 2017-05-18 DIAGNOSIS — Z90710 Acquired absence of both cervix and uterus: Secondary | ICD-10-CM | POA: Diagnosis not present

## 2017-05-18 DIAGNOSIS — D649 Anemia, unspecified: Secondary | ICD-10-CM | POA: Insufficient documentation

## 2017-05-18 DIAGNOSIS — Z8673 Personal history of transient ischemic attack (TIA), and cerebral infarction without residual deficits: Secondary | ICD-10-CM | POA: Diagnosis not present

## 2017-05-18 DIAGNOSIS — Z79899 Other long term (current) drug therapy: Secondary | ICD-10-CM | POA: Insufficient documentation

## 2017-05-18 DIAGNOSIS — E119 Type 2 diabetes mellitus without complications: Secondary | ICD-10-CM | POA: Diagnosis not present

## 2017-05-18 DIAGNOSIS — E785 Hyperlipidemia, unspecified: Secondary | ICD-10-CM | POA: Insufficient documentation

## 2017-05-18 DIAGNOSIS — F329 Major depressive disorder, single episode, unspecified: Secondary | ICD-10-CM | POA: Diagnosis not present

## 2017-05-18 DIAGNOSIS — Z7902 Long term (current) use of antithrombotics/antiplatelets: Secondary | ICD-10-CM | POA: Insufficient documentation

## 2017-05-18 HISTORY — PX: BIOPSY: SHX5522

## 2017-05-18 HISTORY — PX: ESOPHAGOGASTRODUODENOSCOPY (EGD) WITH PROPOFOL: SHX5813

## 2017-05-18 HISTORY — PX: POLYPECTOMY: SHX5525

## 2017-05-18 HISTORY — PX: COLONOSCOPY WITH PROPOFOL: SHX5780

## 2017-05-18 LAB — GLUCOSE, CAPILLARY
Glucose-Capillary: 91 mg/dL (ref 65–99)
Glucose-Capillary: 95 mg/dL (ref 65–99)

## 2017-05-18 SURGERY — COLONOSCOPY WITH PROPOFOL
Anesthesia: Monitor Anesthesia Care

## 2017-05-18 MED ORDER — LACTATED RINGERS IV SOLN
INTRAVENOUS | Status: DC
  Administered 2017-05-18: 10:00:00 via INTRAVENOUS

## 2017-05-18 MED ORDER — LIDOCAINE VISCOUS 2 % MT SOLN
OROMUCOSAL | Status: AC
  Filled 2017-05-18: qty 15

## 2017-05-18 MED ORDER — GLYCOPYRROLATE 0.2 MG/ML IJ SOLN
0.2000 mg | Freq: Once | INTRAMUSCULAR | Status: AC | PRN
  Administered 2017-05-18: 0.2 mg via INTRAVENOUS
  Filled 2017-05-18: qty 1

## 2017-05-18 MED ORDER — FENTANYL CITRATE (PF) 100 MCG/2ML IJ SOLN
25.0000 ug | Freq: Once | INTRAMUSCULAR | Status: AC
  Administered 2017-05-18: 25 ug via INTRAVENOUS

## 2017-05-18 MED ORDER — SIMETHICONE 40 MG/0.6ML PO SUSP
ORAL | Status: DC | PRN
  Administered 2017-05-18: 100 mL

## 2017-05-18 MED ORDER — LIDOCAINE VISCOUS 2 % MT SOLN
15.0000 mL | Freq: Once | OROMUCOSAL | Status: AC
  Administered 2017-05-18: 15 mL via OROMUCOSAL

## 2017-05-18 MED ORDER — CHLORHEXIDINE GLUCONATE CLOTH 2 % EX PADS: 6.0000 | MEDICATED_PAD | Freq: Once | CUTANEOUS | Status: DC

## 2017-05-18 MED ORDER — PROPOFOL 500 MG/50ML IV EMUL
INTRAVENOUS | Status: DC | PRN
  Administered 2017-05-18: 10:00:00 via INTRAVENOUS
  Administered 2017-05-18: 150 ug/kg/min via INTRAVENOUS

## 2017-05-18 MED ORDER — LIDOCAINE HCL (PF) 1 % IJ SOLN
INTRAMUSCULAR | Status: AC
  Filled 2017-05-18: qty 5

## 2017-05-18 MED ORDER — MIDAZOLAM HCL 2 MG/2ML IJ SOLN
1.0000 mg | INTRAMUSCULAR | Status: AC
  Administered 2017-05-18: 2 mg via INTRAVENOUS
  Filled 2017-05-18: qty 2

## 2017-05-18 MED ORDER — FENTANYL CITRATE (PF) 100 MCG/2ML IJ SOLN
INTRAMUSCULAR | Status: AC
Start: 2017-05-18 — End: 2017-05-18
  Filled 2017-05-18: qty 2

## 2017-05-18 MED ORDER — LIDOCAINE HCL (CARDIAC) 10 MG/ML IV SOLN
INTRAVENOUS | Status: DC | PRN
  Administered 2017-05-18: 30 mg via INTRAVENOUS

## 2017-05-18 NOTE — Transfer of Care (Signed)
Immediate Anesthesia Transfer of Care Note  Patient: Theresa Kramer  Procedure(s) Performed: Procedure(s) with comments: COLONOSCOPY WITH PROPOFOL (N/A) - 1100 ESOPHAGOGASTRODUODENOSCOPY (EGD) WITH PROPOFOL (N/A) BIOPSY - gastric bx's POLYPECTOMY - polyp at hepatic flexure, descending colon  Patient Location: PACU  Anesthesia Type:MAC  Level of Consciousness: awake, alert , oriented and patient cooperative  Airway & Oxygen Therapy: Patient Spontanous Breathing and Patient connected to face mask oxygen  Post-op Assessment: Report given to RN and Post -op Vital signs reviewed and stable  Post vital signs: Reviewed and stable  Last Vitals:  Vitals:   05/18/17 0910  BP: (!) 139/59  Pulse: 77  Resp: 16  Temp: 36.9 C    Last Pain:  Vitals:   05/18/17 0910  TempSrc: Oral         Complications: No apparent anesthesia complications

## 2017-05-18 NOTE — H&P (Signed)
@LOGO @   Primary Care Physician:  Asencion Noble, MD Primary Gastroenterologist:  Dr. Gala Romney  Pre-Procedure History & Physical: HPI:  Theresa Kramer is a 76 y.o. female here for further evaluation of iron deficiency anemia via EGD and colonoscopy. Patient denies overt bleeding; she denies dysphagia. Possibility of further evaluation via capsule status small bowel reviewed today.  Past Medical History:  Diagnosis Date  . Arthritis   . B12 deficiency   . Bronchitis    hx of  . Cancer ([place])   . Complication of anesthesia   . Depression    "not taking medication"  . Diabetes mellitus   . GERD (gastroesophageal reflux disease)   . H/O hiatal hernia   . Hyperlipidemia   . Hypertension   . Noncompliance with medication regimen   . Pinched cervical nerve root   . PONV (postoperative nausea and vomiting)   . Stroke ([place])   . Urinary tract infection    hx of    Past Surgical History:  Procedure Laterality Date  . ABDOMINAL HYSTERECTOMY    . ANTERIOR CERVICAL DECOMP/DISCECTOMY FUSION  03/15/2012   Procedure: ANTERIOR CERVICAL DECOMPRESSION/DISCECTOMY FUSION 2 LEVELS;  Surgeon: Ophelia Charter, MD;  Location: [place] [facility];  Service: Neurosurgery;  Laterality: N/A;  Cervical four-five Cervical five six anterior cervical decompression with fusion interbody prothesis plating and bonegraft  . APPENDECTOMY    . BONE CYST EXCISION     from lower back and foot  . CHOLECYSTECTOMY    . COLONOSCOPY  2011   Dr. Gala Romney: prep compromised exam. diverticulosis, hemorrhoids. next tcs 2021  . DILATION AND CURETTAGE OF UTERUS    . 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 Multiple distal rings dilated and disrupted as described above. Small hiatal hernia.   Marland Kitchen EXCISION METACARPAL MASS Left 03/03/2016   Procedure: EXCISION OF LEFT  DORSAL MASS OF EXTENSOR TENDON;  Surgeon: Milly Jakob, MD;  Location: [place];   Service: Orthopedics;  Laterality: Left;  . EYE SURGERY     bilateral cataract surgery,   . HIP ARTHROPLASTY     right  . JOINT REPLACEMENT    . KNEE SURGERY     torn cartilage repair  . MALONEY DILATION N/A 09/19/2014   Procedure: Venia Minks DILATION;  Surgeon: Daneil Dolin, MD;  Location: [facility];  Service: Endoscopy;  Laterality: N/A;  . ORTHOPEDIC SURGERY    . ROTATOR CUFF REPAIR     "multiple in both shoulders" and total shoulder replacement in left arm  . SAVORY DILATION N/A 09/19/2014   Procedure: SAVORY DILATION;  Surgeon: Daneil Dolin, MD;  Location: [facility];  Service: Endoscopy;  Laterality: N/A;  . TONSILLECTOMY      Prior to Admission medications   Medication Sig Start Date End Date Taking? Authorizing Provider  acetaminophen (TYLENOL ARTHRITIS PAIN) 650 MG CR tablet Take 1,300 mg by mouth 2 (two) times daily.    Yes [provider]  amLODipine (NORVASC) 5 MG tablet Take 5 mg by mouth at bedtime.    Yes [provider]  citalopram (CELEXA) 20 MG tablet Take 20 mg by mouth daily.     Yes [provider]  clopidogrel (PLAVIX) 75 MG tablet TAKE 1 TABLET BY MOUTH  DAILY 01/20/17  Yes Rosalin Hawking, MD  clotrimazole (LOTRIMIN) 1 % cream Apply 1 application topically 2 (two) times daily as needed. For rash    Yes [provider]  cyanocobalamin (,VITAMIN B-12,) 1000 MCG/ML injection Inject 1,000 mcg into the muscle every 30 (thirty) days. Usually on the 1st    Yes [provider]  ferrous sulfate 325 (65 FE) MG EC tablet Take 325 mg by mouth daily.    Yes [provider]  glipiZIDE (GLUCOTROL XL) 5 MG 24 hr tablet Take 5 mg by mouth daily.     Yes [provider]  insulin glargine (LANTUS) 100 UNIT/ML injection Inject 55 Units into the skin at bedtime.    Yes [provider]  insulin lispro (HUMALOG) 100 UNIT/ML injection Inject 8 Units into the skin 3 (three) times daily before meals.    Yes  [provider]  LORazepam (ATIVAN) 0.5 MG tablet Take 0.5 mg by mouth 2 (two) times daily. Breakfast and after lunch   Yes [provider]  losartan-hydrochlorothiazide (HYZAAR) 100-25 MG tablet Take 1 tablet by mouth daily.   Yes [provider]  metFORMIN (GLUCOPHAGE) 1000 MG tablet Take 500 mg by mouth 2 (two) times daily with a meal. After lunch and bedtime   Yes [provider]  oxybutynin (DITROPAN-XL) 10 MG 24 hr tablet Take 10 mg by mouth at bedtime.    Yes [provider]  pantoprazole (PROTONIX) 40 MG tablet Take 40 mg by mouth daily.   Yes [provider]  pramipexole (MIRAPEX) 0.25 MG tablet Take 0.25 mg by mouth at bedtime.    Yes [provider]  pravastatin (PRAVACHOL) 80 MG tablet Take 1 tablet (80 mg total) by mouth daily. Patient taking differently: Take 80 mg by mouth at bedtime.  08/01/16  Yes Kelvin Cellar, MD  ranitidine (ZANTAC) 150 MG tablet Take 150 mg by mouth at bedtime.   Yes [provider]  risperiDONE (RISPERDAL) 0.5 MG tablet Take 0.5 mg by mouth 2 (two) times daily. Breakfast and lunch   Yes [provider]  traMADol (ULTRAM) 50 MG tablet Take 1 tablet (50 mg total) by mouth every 6 (six) hours as needed for moderate pain. 08/04/16  Yes Sheikh, Omair Latif, DO  traZODone (DESYREL) 50 MG tablet Take 50 mg by mouth at bedtime.   Yes [provider]  polyethylene glycol-electrolytes (TRILYTE) 420 g solution Take 4,000 mLs by mouth as directed. 04/08/17   Daneil Dolin, MD    Allergies as of 03/25/2017 - Review Complete 03/25/2017  Allergen Reaction Noted  . Meloxicam Other (See Comments) 03/11/2012    Family History  Problem Relation Age of Onset  . Cirrhosis Brother        etoh  . Colon cancer Neg Hx     Social History   Social History  . Marital status: Widowed    Spouse name: N/A  . Number of children: N/A  . Years of education: N/A   Occupational History   . Not on file.   Social History Main Topics  . Smoking status: Former Smoker    Quit date: 05/13/1974  . Smokeless tobacco: Never Used     Comment: stopped 25 years ago  . Alcohol use No  . Drug use: No  . Sexual activity: Not on file   Other Topics Concern  . Not on file   Social History Narrative  . No narrative on file    Review of Systems: See HPI, otherwise negative ROS  Physical Exam: There were no vitals taken for this visit. General:   Alert,  Well-developed, well-nourished, pleasant and cooperative in NAD Neck:  Supple; no  masses or thyromegaly. No significant cervical adenopathy. Lungs:  Clear throughout to auscultation.   No wheezes, crackles, or rhonchi. No acute distress. Heart:  Regular rate and rhythm; no murmurs, clicks, rubs,  or gallops. Abdomen: Non-distended, normal bowel sounds.  Soft and nontender without appreciable mass or hepatosplenomegaly.  Pulses:  Normal pulses noted. Extremities:  Without clubbing or edema.  Impression/Recommendations:  Pleasant 76 year old lady with iron deficiency anemia. I have offered the patient had EGD and colonoscopy today to further evaluate.  The risks, benefits, limitations, imponderables and alternatives regarding both EGD and colonoscopy have been reviewed with the patient. Questions have been answered. All parties agreeable.      Notice: This dictation was prepared with Dragon dictation along with smaller phrase technology. Any transcriptional errors that result from this process are unintentional and may not be corrected upon review.

## 2017-05-18 NOTE — Op Note (Signed)
[facility] Patient Name: Theresa Kramer Procedure Date: 05/18/2017 9:59 AM MRN: [id number] Date of Birth: [birth year] Attending MD: Norvel Richards , MD CSN: [id number] Age: 76 Admit Type: Outpatient Procedure:                Colonoscopy Indications:              Iron deficiency anemia Providers:                Norvel Richards, MD, Rosina Lowenstein, RN, Randa Spike, Technician Referring MD:             Asencion Noble Medicines:                Propofol per Anesthesia Complications:            No immediate complications. Estimated Blood Loss:     Estimated blood loss was minimal. Procedure:                Pre-Anesthesia Assessment:                           - Prior to the procedure, a History and Physical                            was performed, and patient medications and                            allergies were reviewed. The patient's tolerance of                            previous anesthesia was also reviewed. The risks                            and benefits of the procedure and the sedation                            options and risks were discussed with the patient.                            All questions were answered, and informed consent                            was obtained. Prior Anticoagulants: The patient has                            taken no previous anticoagulant or antiplatelet                            agents. ASA Grade Assessment: III - A patient with                            severe systemic disease. After reviewing the risks  and benefits, the patient was deemed in                            satisfactory condition to undergo the procedure.                           After obtaining informed consent, the colonoscope                            was passed under direct vision. Throughout the                            procedure, the patient's blood pressure, pulse, and   oxygen saturations were monitored continuously. The                            [id number] ([id number]) scope was introduced through                            the and advanced to the the cecum, identified by                            appendiceal orifice and ileocecal valve. The entire                            colon was well visualized. The ileocecal valve,                            appendiceal orifice, and rectum were photographed.                            The quality of the bowel preparation was adequate.                            The ileocecal valve, appendiceal orifice, and                            rectum were photographed. The entire colon was well                            visualized. The patient tolerated the procedure                            well. The quality of the bowel preparation was                            adequate. Scope In: 10:02:37 AM Scope Out: 10:19:52 AM Scope Withdrawal Time: 0 hours 6 minutes 33 seconds  Total Procedure Duration: 0 hours 17 minutes 15 seconds  Findings:      Two pedunculated polyps were found in the descending colon and hepatic       flexure. These polyps were removed with a cold snare. Resection and       retrieval were complete. Estimated blood loss was minimal.      The exam  was otherwise without abnormality on direct and retroflexion       views.      Scattered small and large-mouthed diverticula were found in the       recto-sigmoid colon, sigmoid colon and descending colon. Impression:               - Two polyps in the descending colon and at the                            hepatic flexure, removed with a cold snare.                            Resected and retrieved.                           - The examination was otherwise normal on direct                            and retroflexion views.                           - Diverticulosis in the recto-sigmoid colon, in the                            sigmoid colon and in the descending  colon. Moderate Sedation:      Moderate (conscious) sedation was personally administered by an       anesthesia professional. The following parameters were monitored: oxygen       saturation, heart rate, blood pressure, respiratory rate, EKG, adequacy       of pulmonary ventilation, and response to care. Total physician       intraservice time was 28 minutes. Recommendation:           - Patient has a contact number available for                            emergencies. The signs and symptoms of potential                            delayed complications were discussed with the                            patient. Return to normal activities tomorrow.                            Written discharge instructions were provided to the                            patient.                           - Continue present medications.                           - No repeat colonoscopy due to age.                           -  Return to GI office (date not yet determined).                            patient may need imaging of the small intestine to                            complete the GI evaluation. See EGD report.                           - Advance diet as tolerated. Procedure Code(s):        --- Professional ---                           [phone], Colonoscopy, flexible; with removal of                            tumor(s), polyp(s), or other lesion(s) by snare                            technique Diagnosis Code(s):        --- Professional ---                           D12.4, Benign neoplasm of descending colon                           D12.3, Benign neoplasm of transverse colon (hepatic                            flexure or splenic flexure)                           D50.9, Iron deficiency anemia, unspecified                           K57.30, Diverticulosis of large intestine without                            perforation or abscess without bleeding CPT copyright 2016 American Medical Association. All  rights reserved. The codes documented in this report are preliminary and upon coder review may  be revised to meet current compliance requirements. [name]. [name], MD Norvel Richards, MD 05/18/2017 10:36:08 AM This report has been signed electronically. Number of Addenda: 0

## 2017-05-18 NOTE — Op Note (Signed)
[facility] Patient Name: Theresa Kramer Procedure Date: 05/18/2017 9:45 AM MRN: [id number] Date of Birth: 1941-07-22 Attending MD: Norvel Richards , MD CSN: [id number] Age: 76 Admit Type: Ambulatory Procedure:                Upper GI endoscopy Indications:              Iron deficiency anemia Providers:                Norvel Richards, MD, Rosina Lowenstein, RN, Randa Spike, Technician Referring MD:              Medicines:                Propofol per Anesthesia Complications:            No immediate complications. Estimated Blood Loss:     Estimated blood loss was minimal. Procedure:                Pre-Anesthesia Assessment:                           - Prior to the procedure, a History and Physical                            was performed, and patient medications and                            allergies were reviewed. The patient's tolerance of                            previous anesthesia was also reviewed. The risks                            and benefits of the procedure and the sedation                            options and risks were discussed with the patient.                            All questions were answered, and informed consent                            was obtained. Prior Anticoagulants: The patient has                            taken no previous anticoagulant or antiplatelet                            agents. ASA Grade Assessment: III - A patient with                            severe systemic disease. After reviewing the risks  and benefits, the patient was deemed in                            satisfactory condition to undergo the procedure.                           After obtaining informed consent, the endoscope was                            passed under direct vision. Throughout the                            procedure, the patient's blood pressure, pulse, and                            oxygen  saturations were monitored continuously. The                            [id number] ([id number]) scope was introduced through the                            and advanced to the second part of duodenum. The                            upper GI endoscopy was accomplished without                            difficulty. The patient tolerated the procedure                            well. Scope In: 9:52:32 AM Scope Out: 9:57:52 AM Total Procedure Duration: 0 hours 5 minutes 20 seconds  Findings:      A non-obstructing Schatzki ring (acquired) was found at the       gastroesophageal junction. a large mid esophageal diverticulum, again,       noted      Multiple localized erosions were found in the gastric antrum. No ulcer       or infiltrating process. This was biopsied with a cold forceps for       histology. Estimated blood loss was minimal. Impression:               - Non-obstructing Schatzki ring. esophageal                            diverticulum.                           - Erosive gastropathy. Biopsied. Moderate Sedation:      Moderate (conscious) sedation was personally administered by an       anesthesia professional. The following parameters were monitored: oxygen       saturation, heart rate, blood pressure, respiratory rate, EKG, adequacy       of pulmonary ventilation, and response to care. Total physician       intraservice time was 23 minutes. Recommendation:           - Patient has a  contact number available for                            emergencies. The signs and symptoms of potential                            delayed complications were discussed with the                            patient. Return to normal activities tomorrow.                            Written discharge instructions were provided to the                            patient.                           - Resume previous diet.                           - Continue present medications.                           -  Await pathology results. Procedure Code(s):        --- Professional ---                           [phone], Esophagogastroduodenoscopy, flexible,                            transoral; with biopsy, single or multiple Diagnosis Code(s):        --- Professional ---                           K22.2, Esophageal obstruction                           K31.89, Other diseases of stomach and duodenum                           D50.9, Iron deficiency anemia, unspecified CPT copyright 2016 American Medical Association. All rights reserved. The codes documented in this report are preliminary and upon coder review may  be revised to meet current compliance requirements. [name]. Theresa Moan, MD Norvel Richards, MD 05/18/2017 10:27:45 AM This report has been signed electronically. Number of Addenda: 0

## 2017-05-18 NOTE — Anesthesia Procedure Notes (Signed)
Procedure Name: MAC Date/Time: 05/18/2017 9:45 AM Performed by: Andree Elk, Ady Heimann A Pre-anesthesia Checklist: Patient identified, Emergency Drugs available, Suction available, Patient being monitored and Timeout performed Oxygen Delivery Method: Simple face mask

## 2017-05-18 NOTE — Discharge Instructions (Addendum)
Colon Polyps °Polyps are tissue growths inside the body. Polyps can grow in many places, including the large intestine (colon). A polyp may be a round bump or a mushroom-shaped growth. You could have one polyp or several. °Most colon polyps are noncancerous (benign). However, some colon polyps can become cancerous over time. °What are the causes? °The exact cause of colon polyps is not known. °What increases the risk? °This condition is more likely to develop in people who: °· Have a family history of colon cancer or colon polyps. °· Are older than 50 or older than 45 if they are African American. °· Have inflammatory bowel disease, such as ulcerative colitis or Crohn disease. °· Are overweight. °· Smoke cigarettes. °· Do not get enough exercise. °· Drink too much alcohol. °· Eat a diet that is: °? High in fat and red meat. °? Low in fiber. °· Had childhood cancer that was treated with abdominal radiation. ° °What are the signs or symptoms? °Most polyps do not cause symptoms. If you have symptoms, they may include: °· Blood coming from your rectum when having a bowel movement. °· Blood in your stool. The stool may look dark red or black. °· A change in bowel habits, such as constipation or diarrhea. ° °How is this diagnosed? °This condition is diagnosed with a colonoscopy. This is a procedure that uses a lighted, flexible scope to look at the inside of your colon. °How is this treated? °Treatment for this condition involves removing any polyps that are found. Those polyps will then be tested for cancer. If cancer is found, your health care provider will talk to you about options for colon cancer treatment. °Follow these instructions at home: °Diet °· Eat plenty of fiber, such as fruits, vegetables, and whole grains. °· Eat foods that are high in calcium and vitamin D, such as milk, cheese, yogurt, eggs, liver, fish, and broccoli. °· Limit foods high in fat, red meats, and processed meats, such as hot dogs, sausage,  bacon, and lunch meats. °· Maintain a healthy weight, or lose weight if recommended by your health care provider. °General instructions °· Do not smoke cigarettes. °· Do not drink alcohol excessively. °· Keep all follow-up visits as told by your health care provider. This is important. This includes keeping regularly scheduled colonoscopies. Talk to your health care provider about when you need a colonoscopy. °· Exercise every day or as told by your health care provider. °Contact a health care provider if: °· You have new or worsening bleeding during a bowel movement. °· You have new or increased blood in your stool. °· You have a change in bowel habits. °· You unexpectedly lose weight. °This information is not intended to replace advice given to you by your health care provider. Make sure you discuss any questions you have with your health care provider. °Document Released: 07/16/2004 Document Revised: 03/27/2016 Document Reviewed: 09/10/2015 °[employer] Interactive Patient Education © 2018 [employer] ° °Colonoscopy °Discharge Instructions ° °Read the instructions outlined below and refer to this sheet in the next few weeks. These discharge instructions provide you with general information on caring for yourself after you leave the hospital. Your doctor may also give you specific instructions. While your treatment has been planned according to the most current medical practices available, unavoidable complications occasionally occur. If you have any problems or questions after discharge, call Dr. Rourk at [phone]. °ACTIVITY °· You may resume your regular activity, but move at a slower pace for the next 24   hours.   Take frequent rest periods for the next 24 hours.   Walking will help get rid of the air and reduce the bloated feeling in your belly (abdomen).   No driving for 24 hours (because of the medicine (anesthesia) used during the test).    Do not sign any important legal documents or operate any  machinery for 24 hours (because of the anesthesia used during the test).  NUTRITION  Drink plenty of fluids.   You may resume your normal diet as instructed by your doctor.   Begin with a light meal and progress to your normal diet. Heavy or fried foods are harder to digest and may make you feel sick to your stomach (nauseated).   Avoid alcoholic beverages for 24 hours or as instructed.  MEDICATIONS  You may resume your normal medications unless your doctor tells you otherwise.  WHAT YOU CAN EXPECT TODAY  Some feelings of bloating in the abdomen.   Passage of more gas than usual.   Spotting of blood in your stool or on the toilet paper.  IF YOU HAD POLYPS REMOVED DURING THE COLONOSCOPY:  No aspirin products for 7 days or as instructed.   No alcohol for 7 days or as instructed.   Eat a soft diet for the next 24 hours.  FINDING OUT THE RESULTS OF YOUR TEST Not all test results are available during your visit. If your test results are not back during the visit, make an appointment with your caregiver to find out the results. Do not assume everything is normal if you have not heard from your caregiver or the medical facility. It is important for you to follow up on all of your test results.  SEEK IMMEDIATE MEDICAL ATTENTION IF:  You have more than a spotting of blood in your stool.   Your belly is swollen (abdominal distention).   You are nauseated or vomiting.   You have a temperature over 101.   You have abdominal pain or discomfort that is severe or gets worse throughout the day.  EGD Discharge instructions Please read the instructions outlined below and refer to this sheet in the next few weeks. These discharge instructions provide you with general information on caring for yourself after you leave the hospital. Your doctor may also give you specific instructions. While your treatment has been planned according to the most current medical practices available, unavoidable  complications occasionally occur. If you have any problems or questions after discharge, please call your doctor. ACTIVITY  You may resume your regular activity but move at a slower pace for the next 24 hours.   Take frequent rest periods for the next 24 hours.   Walking will help expel (get rid of) the air and reduce the bloated feeling in your abdomen.   No driving for 24 hours (because of the anesthesia (medicine) used during the test).   You may shower.   Do not sign any important legal documents or operate any machinery for 24 hours (because of the anesthesia used during the test).  NUTRITION  Drink plenty of fluids.   You may resume your normal diet.   Begin with a light meal and progress to your normal diet.   Avoid alcoholic beverages for 24 hours or as instructed by your caregiver.  MEDICATIONS  You may resume your normal medications unless your caregiver tells you otherwise.  WHAT YOU CAN EXPECT TODAY  You may experience abdominal discomfort such as a feeling of fullness or  gas pains.  FOLLOW-UP  Your doctor will discuss the results of your test with you.  SEEK IMMEDIATE MEDICAL ATTENTION IF ANY OF THE FOLLOWING OCCUR:  Excessive nausea (feeling sick to your stomach) and/or vomiting.   Severe abdominal pain and distention (swelling).   Trouble swallowing.   Temperature over 101 F (37.8 C).   Rectal bleeding or vomiting of blood.     Colon polyp information provided  Further recommendations to follow pending review of pathology report

## 2017-05-18 NOTE — Anesthesia Postprocedure Evaluation (Signed)
Anesthesia Post Note  Patient: Theresa Kramer  Procedure(s) Performed: Procedure(s) (LRB): COLONOSCOPY WITH PROPOFOL (N/A) ESOPHAGOGASTRODUODENOSCOPY (EGD) WITH PROPOFOL (N/A) BIOPSY POLYPECTOMY  Patient location during evaluation: PACU Anesthesia Type: MAC Level of consciousness: awake and alert, oriented and patient cooperative Pain management: pain level controlled Vital Signs Assessment: post-procedure vital signs reviewed and stable Respiratory status: spontaneous breathing, respiratory function stable and patient connected to face mask oxygen Cardiovascular status: stable Postop Assessment: no signs of nausea or vomiting Anesthetic complications: no     Last Vitals:  Vitals:   05/18/17 0910  BP: (!) 139/59  Pulse: 77  Resp: 16  Temp: 36.9 C    Last Pain:  Vitals:   05/18/17 0910  TempSrc: Oral                 Arlene Brickel A

## 2017-05-18 NOTE — Anesthesia Preprocedure Evaluation (Signed)
Anesthesia Evaluation  Patient identified by MRN, date of birth, ID band Patient awake    Reviewed: Allergy & Precautions, NPO status , Patient's Chart, lab work & pertinent test results  History of Anesthesia Complications (+) PONV and history of anesthetic complications  Airway Mallampati: II  TM Distance: >3 FB Neck ROM: Full    Dental  (+) Edentulous Lower, Edentulous Upper   Pulmonary former smoker,    breath sounds clear to auscultation       Cardiovascular hypertension, Pt. on medications + Peripheral Vascular Disease   Rhythm:Regular Rate:Normal     Neuro/Psych PSYCHIATRIC DISORDERS Depression  Neuromuscular disease CVA    GI/Hepatic hiatal hernia, GERD  Medicated,  Endo/Other  diabetes, Insulin DependentMorbid obesity  Renal/GU      Musculoskeletal  (+) Arthritis ,   Abdominal   Peds  Hematology  (+) anemia ,   Anesthesia Other Findings   Reproductive/Obstetrics                             Anesthesia Physical Anesthesia Plan  ASA: III  Anesthesia Plan: MAC   Post-op Pain Management:    Induction: Intravenous  PONV Risk Score and Plan:   Airway Management Planned: Simple Face Mask  Additional Equipment:   Intra-op Plan:   Post-operative Plan:   Informed Consent: I have reviewed the patients History and Physical, chart, labs and discussed the procedure including the risks, benefits and alternatives for the proposed anesthesia with the patient or authorized representative who has indicated his/her understanding and acceptance.     Plan Discussed with:   Anesthesia Plan Comments:         Anesthesia Quick Evaluation

## 2017-05-19 ENCOUNTER — Encounter: Payer: Self-pay | Admitting: Hematology

## 2017-05-20 ENCOUNTER — Encounter (HOSPITAL_COMMUNITY): Payer: Self-pay | Admitting: Internal Medicine

## 2017-05-22 ENCOUNTER — Encounter: Payer: Self-pay | Admitting: Internal Medicine

## 2017-06-25 ENCOUNTER — Ambulatory Visit: Payer: Medicare Other | Admitting: Nurse Practitioner

## 2017-06-25 DIAGNOSIS — E785 Hyperlipidemia, unspecified: Secondary | ICD-10-CM | POA: Diagnosis not present

## 2017-06-25 DIAGNOSIS — Z79899 Other long term (current) drug therapy: Secondary | ICD-10-CM | POA: Diagnosis not present

## 2017-06-25 DIAGNOSIS — I10 Essential (primary) hypertension: Secondary | ICD-10-CM | POA: Diagnosis not present

## 2017-06-25 DIAGNOSIS — E1129 Type 2 diabetes mellitus with other diabetic kidney complication: Secondary | ICD-10-CM | POA: Diagnosis not present

## 2017-07-02 ENCOUNTER — Ambulatory Visit: Payer: Medicare Other | Admitting: Nurse Practitioner

## 2017-07-02 DIAGNOSIS — E1129 Type 2 diabetes mellitus with other diabetic kidney complication: Secondary | ICD-10-CM | POA: Diagnosis not present

## 2017-07-02 DIAGNOSIS — C9110 Chronic lymphocytic leukemia of B-cell type not having achieved remission: Secondary | ICD-10-CM | POA: Diagnosis not present

## 2017-07-02 DIAGNOSIS — I10 Essential (primary) hypertension: Secondary | ICD-10-CM | POA: Diagnosis not present

## 2017-07-02 DIAGNOSIS — E785 Hyperlipidemia, unspecified: Secondary | ICD-10-CM | POA: Diagnosis not present

## 2017-07-07 NOTE — Progress Notes (Signed)
Marland Kitchen    HEMATOLOGY/ONCOLOGY CLINIC NOTE  Date of Service:  07/09/2017   Patient Care Team: Asencion Noble, MD as PCP - General Rourk, [name], MD as Consulting Physician (Gastroenterology)  CHIEF COMPLAINTS/PURPOSE OF CONSULTATION:  F/u for B cell lymphoproliferative disorder.  HISTORY OF PRESENTING ILLNESS:  Theresa Kramer is a wonderful 76 y.o. female who has been referred to [place] by Dr Asencion Noble, MD for evaluation and management of lymphocytosis.  Patient has a h/o B12 def, depression, arthritis and DDD and was recently admitted to the hospital with TIA vs delirium in the setting of UTI. 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 She is here for further evaluation of her newly diagnosed B-cell leukemia/lymphoma. Patient reports no fevers. Has some night sweats but those have been in the setting of hypoglycemia. No overt LNadenopathy. No abdominal pain or distension. No reported weight loss. Has chronic fatigue that hasnt significantly changed recently.   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 Her daughter accompanies her.  Patient notes she feels more tired. She will not walk often. Her daughter notes she is taking her iron pill.  She is still anemic and her Hg is 9.5 today. Her iron study is still pending. Her iron pill is not bothering her digestive system. She was offered IV iron in clinic.  Her daughter said she switched to polysaccharide iron 150mg  po twice a day with orange juice for the past month and before that she was taking iron before that once a day.  She is taking Protonix which she has been taking it for a long time, 40 mg once a day.  Due to her use of Protonix I suggest IV iron to see if that will increase her iron level and to better uptake iron into her body.  Her WBC 19.6K today.  Patient reports to  eating but her daughter reports she is not eating well. Her daughter notes there was 2-3 weeks she would not eat then she got better. Recently she barely eats because she claims to not being hungry. She eats half of what she normally eats and does not snack.  When she went to her PCP 2 weeks ago and reports she lost 6 pounds. Her HBA1c was reduced to 6.6 when they visit her PCP. Her metformin, glipizide, and lantus was cut back and her humalog is taken when only when she eats.  Patient claims to not feel depressed. Her daughter is concerned.  Patient denied fever, chills and night sweats. Denies change in bowel habits or mouth sores.    MEDICAL HISTORY:  Past Medical History:  Diagnosis Date  . Arthritis   . B12 deficiency   . Bronchitis    hx of  . Cancer ([place])   . Complication of anesthesia   . Depression    "not taking medication"  . Diabetes mellitus   . GERD (gastroesophageal reflux disease)   . H/O hiatal hernia   . Hyperlipidemia   . Hypertension   . Noncompliance with medication regimen   . Pinched cervical nerve root   . PONV (postoperative nausea and vomiting)   . Stroke ([place])   . Urinary tract infection    hx of    SURGICAL HISTORY: Past Surgical History:  Procedure Laterality Date  . ABDOMINAL HYSTERECTOMY    . ANTERIOR CERVICAL DECOMP/DISCECTOMY FUSION  03/15/2012   Procedure: ANTERIOR CERVICAL DECOMPRESSION/DISCECTOMY FUSION 2 LEVELS;  Surgeon: Ophelia Charter, MD;  Location: [facility];  Service: Neurosurgery;  Laterality: N/A;  Cervical four-five Cervical five six anterior cervical decompression with fusion interbody prothesis plating and bonegraft  . APPENDECTOMY    . BIOPSY  05/18/2017   Procedure: BIOPSY;  Surgeon: Daneil Dolin, MD;  [facility];  Service: Endoscopy;;  gastric bx's  . BONE CYST EXCISION     from lower back and foot  . CHOLECYSTECTOMY    . COLONOSCOPY  2011   Dr. Gala Romney: prep compromised exam. diverticulosis, hemorrhoids.  next tcs 2021  . COLONOSCOPY WITH PROPOFOL N/A 05/18/2017   Procedure: COLONOSCOPY WITH PROPOFOL;  Surgeon: Daneil Dolin, MD;  [facility];  Service: Endoscopy;  Laterality: N/A;  1100  . DILATION AND CURETTAGE OF UTERUS    . 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 Multiple distal rings dilated and disrupted as described above. Small hiatal hernia.   Marland Kitchen ESOPHAGOGASTRODUODENOSCOPY (EGD) WITH PROPOFOL N/A 05/18/2017   Procedure: ESOPHAGOGASTRODUODENOSCOPY (EGD) WITH PROPOFOL;  Surgeon: Daneil Dolin, MD;  [facility];  Service: Endoscopy;  Laterality: N/A;  . EXCISION METACARPAL MASS Left 03/03/2016   Procedure: EXCISION OF LEFT  DORSAL MASS OF EXTENSOR TENDON;  Surgeon: Milly Jakob, MD;  Location: [facility];  Service: Orthopedics;  Laterality: Left;  . EYE SURGERY     bilateral cataract surgery,   . HIP ARTHROPLASTY     right  . JOINT REPLACEMENT    . KNEE SURGERY     torn cartilage repair  . MALONEY DILATION N/A 09/19/2014   Procedure: Venia Minks DILATION;  Surgeon: Daneil Dolin, MD;  [facility];  Service: Endoscopy;  Laterality: N/A;  . ORTHOPEDIC SURGERY    . POLYPECTOMY  05/18/2017   Procedure: POLYPECTOMY;  Surgeon: Daneil Dolin, MD;  [facility];  Service: Endoscopy;;  polyp at hepatic flexure, descending colon  . ROTATOR CUFF REPAIR     "multiple in both shoulders" and total shoulder replacement in left arm  . SAVORY DILATION N/A 09/19/2014   Procedure: SAVORY DILATION;  Surgeon: Daneil Dolin, MD;  [facility];  Service: Endoscopy;  Laterality: N/A;  . TONSILLECTOMY      SOCIAL HISTORY: Social History   Social History  . Marital status: Widowed    Spouse name: N/A  . Number of children: N/A  . Years of education: N/A   Occupational History  . Not on file.   Social History Main Topics  . Smoking status: Former  Smoker    Quit date: 05/13/1974  . Smokeless tobacco: Never Used     Comment: stopped 25 years ago  . Alcohol use No  . Drug use: No  . Sexual activity: Not on file   Other Topics Concern  . Not on file   Social History Narrative  . No narrative on file    FAMILY HISTORY: Family History  Problem Relation Age of Onset  . Cirrhosis Brother        etoh  . Colon cancer Neg Hx     ALLERGIES:  is allergic to meloxicam.  MEDICATIONS:  Current Outpatient Prescriptions  Medication Sig Dispense Refill  . acetaminophen (TYLENOL ARTHRITIS PAIN) 650 MG CR tablet Take 1,300 mg by mouth 2 (two) times daily.     Marland Kitchen amLODipine (NORVASC) 5 MG tablet Take  5 mg by mouth at bedtime.     . citalopram (CELEXA) 20 MG tablet Take 20 mg by mouth daily.      . clopidogrel (PLAVIX) 75 MG tablet TAKE 1 TABLET BY MOUTH  DAILY 90 tablet 3  . clotrimazole (LOTRIMIN) 1 % cream Apply 1 application topically 2 (two) times daily as needed. For rash     . cyanocobalamin (,VITAMIN B-12,) 1000 MCG/ML injection Inject 1,000 mcg into the muscle every 30 (thirty) days. Usually on the 1st     . ferrous sulfate 325 (65 FE) MG EC tablet Take 325 mg by mouth daily.     Marland Kitchen glipiZIDE (GLUCOTROL XL) 5 MG 24 hr tablet Take 5 mg by mouth daily.      . insulin glargine (LANTUS) 100 UNIT/ML injection Inject 55 Units into the skin at bedtime.     . insulin lispro (HUMALOG) 100 UNIT/ML injection Inject 8 Units into the skin 3 (three) times daily before meals.     Marland Kitchen LORazepam (ATIVAN) 0.5 MG tablet Take 0.5 mg by mouth 2 (two) times daily. Breakfast and after lunch    . losartan-hydrochlorothiazide (HYZAAR) 100-25 MG tablet Take 1 tablet by mouth daily.    . metFORMIN (GLUCOPHAGE) 1000 MG tablet Take 500 mg by mouth 2 (two) times daily with a meal. After lunch and bedtime    . oxybutynin (DITROPAN-XL) 10 MG 24 hr tablet Take 10 mg by mouth at bedtime.     . pantoprazole (PROTONIX) 40 MG tablet Take 40 mg by mouth daily.    .  polyethylene glycol-electrolytes (TRILYTE) 420 g solution Take 4,000 mLs by mouth as directed. 4000 mL 0  . pramipexole (MIRAPEX) 0.25 MG tablet Take 0.25 mg by mouth at bedtime.     . pravastatin (PRAVACHOL) 80 MG tablet Take 1 tablet (80 mg total) by mouth daily. (Patient taking differently: Take 80 mg by mouth at bedtime. ) 30 tablet 3  . ranitidine (ZANTAC) 150 MG tablet Take 150 mg by mouth at bedtime.    . risperiDONE (RISPERDAL) 0.5 MG tablet Take 0.5 mg by mouth 2 (two) times daily. Breakfast and lunch    . traMADol (ULTRAM) 50 MG tablet Take 1 tablet (50 mg total) by mouth every 6 (six) hours as needed for moderate pain. 20 tablet 0  . traZODone (DESYREL) 50 MG tablet Take 50 mg by mouth at bedtime.     No current facility-administered medications for this visit.     REVIEW OF SYSTEMS:    10 Point review of Systems was done is negative except as noted above.  PHYSICAL EXAMINATION: ECOG PERFORMANCE STATUS: 2 - Symptomatic, <50% confined to bed  . Vitals:   07/09/17 0911  BP: (!) 142/99  Pulse: (!) 103  Resp: 18  Temp: 98.6 F (37 C)  SpO2: 99%   Filed Weights   07/09/17 0911  Weight: 213 lb 9.6 oz (96.9 kg)   .Body mass index is 35.54 kg/m.  GENERAL:alert, in no acute distress and comfortable SKIN: skin color, texture, turgor are normal, no rashes or significant lesions EYES: normal, conjunctiva are pink and non-injected, sclera clear OROPHARYNX:no exudate, no erythema and lips, buccal mucosa, and tongue normal  NECK: supple, no JVD, thyroid normal size, non-tender, without nodularity LYMPH:  no palpable lymphadenopathy in the cervical, axillary or inguinal LUNGS: clear to auscultation with normal respiratory effort HEART: regular rate & rhythm,  no murmurs and no lower extremity edema ABDOMEN: abdomen soft, non-tender, normoactive bowel sounds ,  no palpable hepatosplenomegaly. Musculoskeletal: no cyanosis of digits and no clubbing  PSYCH: alert & oriented x 3  with fluent speech NEURO: no focal motor/sensory deficits  LABORATORY DATA:  I have reviewed the data as listed  . CBC Latest Ref Rng & Units 07/09/2017 05/13/2017 04/08/2017  WBC 3.9 - 10.3 10e3/uL 19.6(H) 24.1(H) 21.0(H)  Hemoglobin 11.6 - 15.9 g/dL 9.5(L) 9.6(L) 9.2(L)  Hematocrit 34.8 - 46.6 % 30.1(L) 31.9(L) 29.0(L)  Platelets 145 - 400 10e3/uL 266 324 281   . CBC    Component Value Date/Time   WBC 19.6 (H) 07/09/2017 0846   WBC 24.1 (H) 05/13/2017 1050   RBC 3.97 07/09/2017 0846   RBC 3.96 05/13/2017 1050   HGB 9.5 (L) 07/09/2017 0846   HCT 30.1 (L) 07/09/2017 0846   PLT 266 07/09/2017 0846   MCV 75.7 (L) 07/09/2017 0846   MCH 23.8 (L) 07/09/2017 0846   MCH 24.2 (L) 05/13/2017 1050   MCHC 31.5 07/09/2017 0846   MCHC 30.1 05/13/2017 1050   RDW 18.1 (H) 07/09/2017 0846   LYMPHSABS 15.1 (H) 07/09/2017 0846   MONOABS 0.1 07/09/2017 0846   EOSABS 0.1 07/09/2017 0846   BASOSABS 0.1 07/09/2017 0846     . CMP Latest Ref Rng & Units 05/13/2017 04/08/2017 04/08/2017  Glucose 65 - 99 mg/dL 147(H) 175(H) -  BUN 6 - 20 mg/dL 28(H) 20.9 -  Creatinine 0.44 - 1.00 mg/dL 1.00 0.9 -  Sodium 135 - 145 mmol/L 138 140 -  Potassium 3.5 - 5.1 mmol/L 4.2 4.3 -  Chloride 101 - 111 mmol/L 100(L) - -  CO2 22 - 32 mmol/L 27 23 -  Calcium 8.9 - 10.3 mg/dL 9.3 9.4 -  Total Protein 6.0 - 8.5 g/dL - 7.2 6.8  Total Bilirubin 0.20 - 1.20 mg/dL - 0.41 -  Alkaline Phos 40 - 150 U/L - 112 -  AST 5 - 34 U/L - 19 -  ALT 0 - 55 U/L - 21 -    Lab Results  Component Value Date   IRON 24 (L) 04/08/2017   TIBC 311 04/08/2017   IRONPCTSAT 8 (L) 04/08/2017   (Iron and TIBC)  Lab Results  Component Value Date   FERRITIN 24 04/08/2017    Lab Results  Component Value Date   LDH 203 01/05/2017   PENDING for 07/09/17        RADIOGRAPHIC STUDIES: I have personally reviewed the radiological images as listed and agreed with the findings in the report. No results found.  PET/CT 10/25/017:   IMPRESSION: 1. Mildly enlarged and mildly hypermetabolic spleen, consistent with the provided history of a lymphoproliferative disorder. 2. No hypermetabolic lymphadenopathy or other hypermetabolic sites of disease. 3. Additional findings include aortic atherosclerosis, three-vessel coronary atherosclerosis, small hiatal hernia and small fat containing left inguinal hernia.   Electronically Signed   By: Ilona Sorrel M.D.   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 Noted to have elevated wbc/lymphocyte counts end of September. Unknown if she had a more chronic elevation of her WBC counts that might put a timeline on her lymphocytosis. -Differential diagnosis- CD5 neg variant of CLL (about 17-20% patients with CLL could be CD5neg) vs B-cell leukemia/lymphoma -PET/CT with no significant LNadenopathy and PET/CT with minimal enlarged and minimally hypermetabolic. FISH shows Trisomy 51 which would be consistent with CLL LDH WNL -  Patient WBC is at 19.6K slightly down from 21k with around 15.1k lymphocytes. LDH WNL Features consistent with indolent chronic lymphoproliferative process - likely CLL    2) Anemia - microcytic likely related to iron deficiency and less likely to be related to CLL.  -continue Polysaccharide iron 150mg  po BID. She also has been taking Protonix. If today's iron study returns below normal we will order IV iron. Informed consent for IV Iron obtained from the patient and her daughter. . 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 Brings  up the possibility of lymphoplasmacytic lymphoma however trisomy 12 is typical for CLL and uncommon in LPL/WM Plan -will f/u on SPEP pending from today  4). Patient Active Problem List   Diagnosis Date Noted  . IDA (iron deficiency anemia) 03/25/2017  . Somnolence, daytime 12/31/2016  . Snoring 12/31/2016  . Occipital neuralgia   . Acute ischemic stroke ([place])   . Stroke ([place])   . 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 Leukocytosis 08/01/2016  . Urinary tract infectious disease 08/01/2016  . Type 2 diabetes mellitus without complication, with long-term current use of insulin ([place]) 07/30/2016  . Essential hypertension 07/30/2016  . Osteoarthritis 07/30/2016  . Normocytic anemia 07/30/2016  . HLD (hyperlipidemia)   . Schatzki's ring   . Cervical herniated disc 03/15/2012  . GERD 04/29/2010  . CHEST PAIN, ATYPICAL 04/29/2010  . DYSPHAGIA UNSPECIFIED 04/29/2010  -continue rf/u with PCP.  RTC with Dr Irene Limbo in 3 months with labs   All of the patients questions were answered with apparent satisfaction. The patient knows to call the clinic with any problems, questions or concerns.   I spent 20 minutes counseling the patient face to face. The total time spent in the appointment was 25 minutes and more than 50% was on counseling and direct patient cares.  This document serves as a record of services personally performed by Sullivan Lone, MD. It was created on her behalf by Joslyn Devon, a trained medical scribe. The creation of this record is based on the scribe's personal observations and the provider's statements to them. This document has been checked and approved by the attending provider.    Sullivan Lone MD [place] AAHIVMS [facility] Hematology/Oncology Physician [facility]  (Office):       [phone] (Work cell):  [phone] (Fax):           [phone]

## 2017-07-09 ENCOUNTER — Other Ambulatory Visit (HOSPITAL_BASED_OUTPATIENT_CLINIC_OR_DEPARTMENT_OTHER): Payer: Medicare Other

## 2017-07-09 ENCOUNTER — Encounter: Payer: Self-pay | Admitting: Hematology

## 2017-07-09 ENCOUNTER — Ambulatory Visit (HOSPITAL_BASED_OUTPATIENT_CLINIC_OR_DEPARTMENT_OTHER): Payer: Medicare Other | Admitting: Hematology

## 2017-07-09 VITALS — BP 142/99 | HR 103 | Temp 98.6°F | Resp 18 | Ht 65.0 in | Wt 213.6 lb

## 2017-07-09 DIAGNOSIS — R630 Anorexia: Secondary | ICD-10-CM

## 2017-07-09 DIAGNOSIS — D509 Iron deficiency anemia, unspecified: Secondary | ICD-10-CM

## 2017-07-09 DIAGNOSIS — E538 Deficiency of other specified B group vitamins: Secondary | ICD-10-CM

## 2017-07-09 DIAGNOSIS — D472 Monoclonal gammopathy: Secondary | ICD-10-CM

## 2017-07-09 DIAGNOSIS — C9110 Chronic lymphocytic leukemia of B-cell type not having achieved remission: Secondary | ICD-10-CM

## 2017-07-09 DIAGNOSIS — D500 Iron deficiency anemia secondary to blood loss (chronic): Secondary | ICD-10-CM

## 2017-07-09 DIAGNOSIS — C9190 Lymphoid leukemia, unspecified not having achieved remission: Secondary | ICD-10-CM | POA: Diagnosis not present

## 2017-07-09 LAB — IRON AND TIBC
%SAT: 9 % — ABNORMAL LOW (ref 21–57)
IRON: 28 ug/dL — AB (ref 41–142)
TIBC: 299 ug/dL (ref 236–444)
UIBC: 271 ug/dL (ref 120–384)

## 2017-07-09 LAB — CBC & DIFF AND RETIC
BASO%: 0.3 % (ref 0.0–2.0)
Basophils Absolute: 0.1 10*3/uL (ref 0.0–0.1)
EOS%: 0.8 % (ref 0.0–7.0)
Eosinophils Absolute: 0.1 10*3/uL (ref 0.0–0.5)
HCT: 30.1 % — ABNORMAL LOW (ref 34.8–46.6)
HGB: 9.5 g/dL — ABNORMAL LOW (ref 11.6–15.9)
Immature Retic Fract: 12.9 % — ABNORMAL HIGH (ref 1.60–10.00)
LYMPH%: 77.2 % — AB (ref 14.0–49.7)
MCH: 23.8 pg — AB (ref 25.1–34.0)
MCHC: 31.5 g/dL (ref 31.5–36.0)
MCV: 75.7 fL — AB (ref 79.5–101.0)
MONO#: 0.1 10*3/uL (ref 0.1–0.9)
MONO%: 0.6 % (ref 0.0–14.0)
NEUT%: 21.1 % — ABNORMAL LOW (ref 38.4–76.8)
NEUTROS ABS: 4.1 10*3/uL (ref 1.5–6.5)
Platelets: 266 10*3/uL (ref 145–400)
RBC: 3.97 10*6/uL (ref 3.70–5.45)
RDW: 18.1 % — ABNORMAL HIGH (ref 11.2–14.5)
Retic %: 1.6 % (ref 0.70–2.10)
Retic Ct Abs: 63.52 10*3/uL (ref 33.70–90.70)
WBC: 19.6 10*3/uL — AB (ref 3.9–10.3)
lymph#: 15.1 10*3/uL — ABNORMAL HIGH (ref 0.9–3.3)

## 2017-07-09 LAB — TECHNOLOGIST REVIEW

## 2017-07-09 LAB — COMPREHENSIVE METABOLIC PANEL
ALBUMIN: 3.3 g/dL — AB (ref 3.5–5.0)
ALK PHOS: 85 U/L (ref 40–150)
ALT: 13 U/L (ref 0–55)
ANION GAP: 11 meq/L (ref 3–11)
AST: 12 U/L (ref 5–34)
BUN: 15.8 mg/dL (ref 7.0–26.0)
CO2: 26 meq/L (ref 22–29)
Calcium: 9.6 mg/dL (ref 8.4–10.4)
Chloride: 105 mEq/L (ref 98–109)
Creatinine: 1 mg/dL (ref 0.6–1.1)
EGFR: 52 mL/min/{1.73_m2} — AB (ref >90)
GLUCOSE: 183 mg/dL — AB (ref 70–140)
POTASSIUM: 4.3 meq/L (ref 3.5–5.1)
SODIUM: 141 meq/L (ref 136–145)
Total Bilirubin: 0.38 mg/dL (ref 0.20–1.20)
Total Protein: 7.3 g/dL (ref 6.4–8.3)

## 2017-07-09 LAB — FERRITIN: Ferritin: 28 ng/ml (ref 9–269)

## 2017-07-09 NOTE — Addendum Note (Signed)
Addended by: Arty Baumgartner on: 07/09/2017 10:14 AM   Modules accepted: Orders

## 2017-07-09 NOTE — Patient Instructions (Addendum)
Continue polysaccharide oral iron 150 mg BID, pending decision on IV iron.   Thank you for choosing [facility] to provide your oncology and hematology care.  To afford each patient quality time with our providers, please arrive 30 minutes before your scheduled appointment time.  If you arrive late for your appointment, you may be asked to reschedule.  We strive to give you quality time with our providers, and arriving late affects you and other patients whose appointments are after yours.   If you are a no show for multiple scheduled visits, you may be dismissed from the clinic at the providers discretion.    Again, thank you for choosing [facility], our hope is that these requests will decrease the amount of time that you wait before being seen by our physicians.  ______________________________________________________________________  Should you have questions after your visit to the [facility], please contact our office at [phone] between the hours of 8:30 and 4:30 p.m.    Voicemails left after 4:30p.m will not be returned until the following business day.    For prescription refill requests, please have your pharmacy contact us directly.  Please also try to allow 48 hours for prescription requests.    Please contact the scheduling department for questions regarding scheduling.  For scheduling of procedures such as PET scans, CT scans, MRI, Ultrasound, etc please contact central scheduling at [phone].    Resources For Cancer Patients and Caregivers:   Oncolink.org:  A wonderful resource for patients and healthcare providers for information regarding your disease, ways to tract your treatment, what to expect, etc.     [place]:  6284482964  Can help patients locate various types of support and financial assistance  Cancer Care: 1-800-813-HOPE (219)876-4569) Provides financial assistance, online support groups,  medication/co-pay assistance.    [place]:  717-599-3250 Where to apply for food stamps, Medicaid, and utility assistance  Medicare Rights Center: (737)296-4030 Helps people with Medicare understand their rights and benefits, navigate the Medicare system, and secure the quality healthcare they deserve  [employer]: [place] [employer]'s shared-ride transportation service for eligible riders who have a disability that prevents them from riding the fixed route bus.    For additional information on assistance programs please contact our social worker:   [place]:  [phone]

## 2017-07-10 LAB — VITAMIN B12: Vitamin B12: 1100 pg/mL (ref 232–1245)

## 2017-07-13 LAB — MULTIPLE MYELOMA PANEL, SERUM
ALBUMIN/GLOB SERPL: 1.1 (ref 0.7–1.7)
Albumin SerPl Elph-Mcnc: 3.5 g/dL (ref 2.9–4.4)
Alpha 1: 0.2 g/dL (ref 0.0–0.4)
Alpha2 Glob SerPl Elph-Mcnc: 1.2 g/dL — ABNORMAL HIGH (ref 0.4–1.0)
B-Globulin SerPl Elph-Mcnc: 1.1 g/dL (ref 0.7–1.3)
GAMMA GLOB SERPL ELPH-MCNC: 0.9 g/dL (ref 0.4–1.8)
GLOBULIN, TOTAL: 3.4 g/dL (ref 2.2–3.9)
IGA/IMMUNOGLOBULIN A, SERUM: 148 mg/dL (ref 64–422)
IGM (IMMUNOGLOBIN M), SRM: 387 mg/dL — AB (ref 26–217)
IgG, Qn, Serum: 698 mg/dL — ABNORMAL LOW (ref 700–1600)
M Protein SerPl Elph-Mcnc: 0.4 g/dL — ABNORMAL HIGH
Total Protein: 6.9 g/dL (ref 6.0–8.5)

## 2017-07-14 ENCOUNTER — Encounter: Payer: Self-pay | Admitting: Hematology

## 2017-07-14 ENCOUNTER — Other Ambulatory Visit: Payer: Self-pay | Admitting: Hematology

## 2017-07-14 ENCOUNTER — Telehealth: Payer: Self-pay

## 2017-07-14 NOTE — Telephone Encounter (Signed)
Attempt to call pt in regard to labs and f/u with MD. Will try again tomorrow.

## 2017-07-15 ENCOUNTER — Telehealth: Payer: Self-pay | Admitting: Hematology

## 2017-07-15 ENCOUNTER — Telehealth: Payer: Self-pay

## 2017-07-15 NOTE — Telephone Encounter (Signed)
Spoke with pt's daughter [name] and told her that it is okay per Dr. Irene Limbo for the pt to stop taking her oral iron. Daughter verbalized understanding and thanks for the information.

## 2017-07-15 NOTE — Telephone Encounter (Signed)
Scheduled appt per sch message and 9/6 los - pts daughter is aware of appt date and time and reminder letter sent in the mail.

## 2017-07-27 ENCOUNTER — Ambulatory Visit (HOSPITAL_BASED_OUTPATIENT_CLINIC_OR_DEPARTMENT_OTHER): Payer: Medicare Other

## 2017-07-27 VITALS — BP 140/44 | HR 74 | Temp 98.4°F | Resp 17

## 2017-07-27 DIAGNOSIS — D509 Iron deficiency anemia, unspecified: Secondary | ICD-10-CM | POA: Diagnosis not present

## 2017-07-27 MED ORDER — SODIUM CHLORIDE 0.9 % IV SOLN
750.0000 mg | Freq: Once | INTRAVENOUS | Status: AC
  Administered 2017-07-27: 750 mg via INTRAVENOUS
  Filled 2017-07-27: qty 15

## 2017-07-27 NOTE — Patient Instructions (Signed)
Ferric carboxymaltose injection What is this medicine? FERRIC CARBOXYMALTOSE (ferr-ik car-box-ee-mol-toes) is an iron complex. Iron is used to make healthy red blood cells, which carry oxygen and nutrients throughout the body. This medicine is used to treat anemia in people with chronic kidney disease or people who cannot take iron by mouth. This medicine may be used for other purposes; ask your health care provider or pharmacist if you have questions. COMMON BRAND NAME(S): Injectafer What should I tell my health care provider before I take this medicine? They need to know if you have any of these conditions: -anemia not caused by low iron levels -high levels of iron in the blood -liver disease -an unusual or allergic reaction to iron, other medicines, foods, dyes, or preservatives -pregnant or trying to get pregnant -breast-feeding How should I use this medicine? This medicine is for infusion into a vein. It is given by a health care professional in a hospital or clinic setting. Talk to your pediatrician regarding the use of this medicine in children. Special care may be needed. Overdosage: If you think you have taken too much of this medicine contact a poison control center or emergency room at once. NOTE: This medicine is only for you. Do not share this medicine with others. What if I miss a dose? It is important not to miss your dose. Call your doctor or health care professional if you are unable to keep an appointment. What may interact with this medicine? Do not take this medicine with any of the following medications: -deferoxamine -dimercaprol -other iron products This medicine may also interact with the following medications: -chloramphenicol -deferasirox This list may not describe all possible interactions. Give your health care provider a list of all the medicines, herbs, non-prescription drugs, or dietary supplements you use. Also tell them if you smoke, drink alcohol, or use  illegal drugs. Some items may interact with your medicine. What should I watch for while using this medicine? Visit your doctor or health care professional regularly. Tell your doctor if your symptoms do not start to get better or if they get worse. You may need blood work done while you are taking this medicine. You may need to follow a special diet. Talk to your doctor. Foods that contain iron include: whole grains/cereals, dried fruits, beans, or peas, leafy green vegetables, and organ meats (liver, kidney). What side effects may I notice from receiving this medicine? Side effects that you should report to your doctor or health care professional as soon as possible: -allergic reactions like skin rash, itching or hives, swelling of the face, lips, or tongue -breathing problems -changes in blood pressure -feeling faint or lightheaded, falls -flushing, sweating, or hot feelings Side effects that usually do not require medical attention (report to your doctor or health care professional if they continue or are bothersome): -changes in taste -constipation -dizziness -headache -nausea -pain, redness, or irritation at site where injected -vomiting This list may not describe all possible side effects. Call your doctor for medical advice about side effects. You may report side effects to FDA at 1-800-FDA-1088. Where should I keep my medicine? This drug is given in a hospital or clinic and will not be stored at home. NOTE: This sheet is a summary. It may not cover all possible information. If you have questions about this medicine, talk to your doctor, pharmacist, or health care provider.  2018 Elsevier/Gold Standard (2015-11-22 11:20:47)  

## 2017-08-03 ENCOUNTER — Ambulatory Visit (HOSPITAL_BASED_OUTPATIENT_CLINIC_OR_DEPARTMENT_OTHER): Payer: Medicare Other

## 2017-08-03 VITALS — BP 128/68 | HR 67 | Temp 98.5°F | Resp 16

## 2017-08-03 DIAGNOSIS — D509 Iron deficiency anemia, unspecified: Secondary | ICD-10-CM

## 2017-08-03 MED ORDER — SODIUM CHLORIDE 0.9 % IV SOLN
INTRAVENOUS | Status: DC
  Administered 2017-08-03: 08:00:00 via INTRAVENOUS

## 2017-08-03 MED ORDER — SODIUM CHLORIDE 0.9 % IV SOLN
750.0000 mg | Freq: Once | INTRAVENOUS | Status: AC
  Administered 2017-08-03: 750 mg via INTRAVENOUS
  Filled 2017-08-03: qty 15

## 2017-08-03 NOTE — Patient Instructions (Signed)
Ferric carboxymaltose injection What is this medicine? FERRIC CARBOXYMALTOSE (ferr-ik car-box-ee-mol-toes) is an iron complex. Iron is used to make healthy red blood cells, which carry oxygen and nutrients throughout the body. This medicine is used to treat anemia in people with chronic kidney disease or people who cannot take iron by mouth. This medicine may be used for other purposes; ask your health care provider or pharmacist if you have questions. COMMON BRAND NAME(S): Injectafer What should I tell my health care provider before I take this medicine? They need to know if you have any of these conditions: -anemia not caused by low iron levels -high levels of iron in the blood -liver disease -an unusual or allergic reaction to iron, other medicines, foods, dyes, or preservatives -pregnant or trying to get pregnant -breast-feeding How should I use this medicine? This medicine is for infusion into a vein. It is given by a health care professional in a hospital or clinic setting. Talk to your pediatrician regarding the use of this medicine in children. Special care may be needed. Overdosage: If you think you have taken too much of this medicine contact a poison control center or emergency room at once. NOTE: This medicine is only for you. Do not share this medicine with others. What if I miss a dose? It is important not to miss your dose. Call your doctor or health care professional if you are unable to keep an appointment. What may interact with this medicine? Do not take this medicine with any of the following medications: -deferoxamine -dimercaprol -other iron products This medicine may also interact with the following medications: -chloramphenicol -deferasirox This list may not describe all possible interactions. Give your health care provider a list of all the medicines, herbs, non-prescription drugs, or dietary supplements you use. Also tell them if you smoke, drink alcohol, or use  illegal drugs. Some items may interact with your medicine. What should I watch for while using this medicine? Visit your doctor or health care professional regularly. Tell your doctor if your symptoms do not start to get better or if they get worse. You may need blood work done while you are taking this medicine. You may need to follow a special diet. Talk to your doctor. Foods that contain iron include: whole grains/cereals, dried fruits, beans, or peas, leafy green vegetables, and organ meats (liver, kidney). What side effects may I notice from receiving this medicine? Side effects that you should report to your doctor or health care professional as soon as possible: -allergic reactions like skin rash, itching or hives, swelling of the face, lips, or tongue -breathing problems -changes in blood pressure -feeling faint or lightheaded, falls -flushing, sweating, or hot feelings Side effects that usually do not require medical attention (report to your doctor or health care professional if they continue or are bothersome): -changes in taste -constipation -dizziness -headache -nausea -pain, redness, or irritation at site where injected -vomiting This list may not describe all possible side effects. Call your doctor for medical advice about side effects. You may report side effects to FDA at 1-800-FDA-1088. Where should I keep my medicine? This drug is given in a hospital or clinic and will not be stored at home. NOTE: This sheet is a summary. It may not cover all possible information. If you have questions about this medicine, talk to your doctor, pharmacist, or health care provider.  2018 Elsevier/Gold Standard (2015-11-22 11:20:47)  

## 2017-08-03 NOTE — Progress Notes (Signed)
[facility]  PATIENT: Theresa Kramer DOB: 06-07-1941   REASON FOR VISIT: follow-up for stroke,  HISTORY FROM: Patient and daughter Theresa Kramer    HISTORY OF PRESENT ILLNESS:UPDATE 10/02/2018[name], 76 year old female returns for follow-up with history of stroke in September 2017. She remains on Plavix for secondary stroke prevention without further stroke or TIA symptoms. There has been no evidence of bleeding, minimal bruising. She remains on Pravachol for hyperlipidemia without myalgias. Most recent hemoglobin A1c 6.6 at PCP office per daughter. She has had adjustments to her diabetic medications. She complains with fatigue, most recent hemoglobin 9.5, ferritin level 28  and iron level decreased to 28. She received iron infusions last week and her fatigue is some better. She has lost about 12 pounds since last seen. She gets no regular exercise and was advised  to do so. She continues to follow with oncology for possible CLL. She has a long history of anemia. She did not have sleep study and is no longer complaining was snoring and daytime drowsiness. She returns for reevaluation   UPDATE 02/28/2018[name], 76 year old female returns for follow-up with history of stroke in September 2017. She is currently on Plavix for secondary stroke prevention without recurrent stroke or TIA symptoms. She has minimal bruising and bleeding noted. Blood pressure in the office today 140/80. Daughter reports most recent hemoglobin A1c was around 7. She does not always follow her diabetic diet. She remains on Pravachol for hyperlipidemia. She denies any muscle aches. She gets no exercise and was encouraged to do so. She has a lot of fatigue. She has been evaluated by hematology for possible CLL. According to her daughter the next step is bone marrow biopsy. She also has an anemia. She has had quite a bit of anxiety since her hospitalization and is currently on Celexa, Ativan and trazodone  for sleep which is helpful . She has a new complaint today of daytime drowsiness, according to the daughter she does snore at night she is not sure if she has any apnea events. 30 day event monitor after last visit was negative for atrial fibrillation. She returns for reevaluation   HISTORY 09/22/16 [name] a 76 y.o.femalewith a past medical history significant for HTN, IDDMwho presented with left sided weakness on 08/02/16.She was also admitted last week with nonspecific abnormal behaviors thought initially to be related to TIA versus delirium from some underlying neurologic stressor. MRI/MRA were obtained which showed some punctate findings in the right ACA distribution, ultimately thought by stroke team not to be infarction, andthe patient was treated for a UTI, and hydrated. Echocardiogram was obtained that was normal. 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 Patient still complained of Urinary Symptoms so she was treated initially with po Ciprofloxacin but switched to IV Ceftriaxone to complete her Abx Course. Because of her immunocompromised state she was also treated for the yeast in her urine. Throughout the course of the hospitalization the patient progressed and PT/OT recommended Home Physical Therapy and Occupational Therapy. Patient was also noticed to have an elevated Leukocyte Count with Atypical Mononuclear Cells and Lymphocytes and Heme/Onc was called and they recommended Flow Cytometry to evaluate for CLL and follow up as an outpatient. Patient at this time was deemed medically stable to be D/C'd Home and is to follow up with PCP, Hematology/Oncology, and Neurology as an outpatient. She follows  up in the stroke clinic today for hospital follow-up. 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  Carotid Doppler was unremarkable 2-D echo EF 60-65%. 30 day event monitoring is suggested we set up. LDL 122 hemoglobin A1c 8.3. She is currently on Plavix without further stroke or TIA symptoms. Minimal bruising or bleeding she has had several adjustments to her insulin dose since discharge. Her blood pressures are well controlled. Her therapies concluded last week however she is not doing a home exercise program. She has trazodone to take for sleep and risperidone for anxiety. She returns for reevaluation. She has follow-up with hematology this afternoon   REVIEW OF SYSTEMS: Full 14 system review of systems performed and notable only for those listed, all others are neg:  Constitutional: Fatigue  Cardiovascular: neg Ear/Nose/Throat: neg  Skin: neg Eyes: neg Respiratory: neg Gastroitestinal: neg  Hematology/Lymphatic: anemia Endocrine: Intolerance to cold and heat Musculoskeletal: neg Allergy/Immunology: neg Neurological: Mild cognitive impairment Psychiatric: anxiety Sleep : RLS   ALLERGIES: Allergies  Allergen Reactions  . Meloxicam Other (See Comments)    Stomach pains    HOME MEDICATIONS: Outpatient Medications Prior to Visit  Medication Sig Dispense Refill  . acetaminophen (TYLENOL ARTHRITIS PAIN) 650 MG CR tablet Take 1,300 mg by mouth 2 (two) times daily.     Marland Kitchen amLODipine (NORVASC) 5 MG tablet Take 5 mg by mouth at bedtime.     . citalopram (CELEXA) 20 MG tablet Take 20 mg by mouth daily.      . clopidogrel (PLAVIX) 75 MG tablet TAKE 1 TABLET BY MOUTH  DAILY 90 tablet 3  . clotrimazole (LOTRIMIN) 1 % cream Apply 1 application topically 2 (two) times daily as needed. For rash     . cyanocobalamin (,VITAMIN B-12,) 1000 MCG/ML injection Inject 1,000 mcg into the muscle every 30 (thirty) days. Usually on the 1st     . glipiZIDE (GLUCOTROL XL) 5 MG 24 hr tablet Take 5 mg by mouth daily.      . insulin glargine (LANTUS) 100 UNIT/ML injection Inject 55 Units into the skin at  bedtime.     . insulin lispro (HUMALOG) 100 UNIT/ML injection Inject 8 Units into the skin 3 (three) times daily before meals.     Marland Kitchen LORazepam (ATIVAN) 0.5 MG tablet Take 0.5 mg by mouth 2 (two) times daily. Breakfast and after lunch    . losartan-hydrochlorothiazide (HYZAAR) 100-25 MG tablet Take 1 tablet by mouth daily.    . metFORMIN (GLUCOPHAGE) 1000 MG tablet Take 500 mg by mouth 2 (two) times daily with a meal. After lunch and bedtime    . oxybutynin (DITROPAN-XL) 10 MG 24 hr tablet Take 10 mg by mouth at bedtime.     . pantoprazole (PROTONIX) 40 MG tablet Take 40 mg by mouth daily.    . pramipexole (MIRAPEX) 0.25 MG tablet Take 0.25 mg by mouth at bedtime.     . pravastatin (PRAVACHOL) 80 MG tablet Take 1 tablet (80 mg total) by mouth daily. (Patient taking differently: Take 80 mg by mouth at bedtime. ) 30 tablet 3  . ranitidine (ZANTAC) 150 MG tablet Take 150 mg by mouth at bedtime.    . risperiDONE (RISPERDAL) 0.5 MG tablet Take 0.5 mg by mouth 2 (two) times daily. Breakfast and lunch    . traMADol (ULTRAM) 50 MG tablet Take 1 tablet (50 mg total) by mouth every 6 (six) hours as needed for moderate pain.  20 tablet 0  . traZODone (DESYREL) 50 MG tablet Take 50 mg by mouth at bedtime.    . iron polysaccharides (NIFEREX) 150 MG capsule Take 150 mg by mouth 2 (two) times daily.    . polyethylene glycol-electrolytes (TRILYTE) 420 g solution Take 4,000 mLs by mouth as directed. 4000 mL 0   No facility-administered medications prior to visit.     PAST MEDICAL HISTORY: Past Medical History:  Diagnosis Date  . Arthritis   . B12 deficiency   . Bronchitis    hx of  . Cancer ([place])   . Complication of anesthesia   . Depression    "not taking medication"  . Diabetes mellitus   . GERD (gastroesophageal reflux disease)   . H/O hiatal hernia   . Hyperlipidemia   . Hypertension   . Noncompliance with medication regimen   . Pinched cervical nerve root   . PONV (postoperative nausea and  vomiting)   . Stroke ([place])   . Urinary tract infection    hx of    PAST SURGICAL HISTORY: Past Surgical History:  Procedure Laterality Date  . ABDOMINAL HYSTERECTOMY    . ANTERIOR CERVICAL DECOMP/DISCECTOMY FUSION  03/15/2012   Procedure: ANTERIOR CERVICAL DECOMPRESSION/DISCECTOMY FUSION 2 LEVELS;  Surgeon: Ophelia Charter, MD;  Location: [place] [facility];  Service: Neurosurgery;  Laterality: N/A;  Cervical four-five Cervical five six anterior cervical decompression with fusion interbody prothesis plating and bonegraft  . APPENDECTOMY    . BIOPSY  05/18/2017   Procedure: BIOPSY;  Surgeon: Daneil Dolin, MD;  [facility];  Service: Endoscopy;;  gastric bx's  . BONE CYST EXCISION     from lower back and foot  . CHOLECYSTECTOMY    . COLONOSCOPY  2011   Dr. Gala Romney: prep compromised exam. diverticulosis, hemorrhoids. next tcs 2021  . COLONOSCOPY WITH PROPOFOL N/A 05/18/2017   Procedure: COLONOSCOPY WITH PROPOFOL;  Surgeon: Daneil Dolin, MD;  [facility]: [facility];  Service: Endoscopy;  Laterality: N/A;  1100  . DILATION AND CURETTAGE OF UTERUS    . 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 Multiple distal rings dilated and disrupted as described above. Small hiatal hernia.   Marland Kitchen ESOPHAGOGASTRODUODENOSCOPY (EGD) WITH PROPOFOL N/A 05/18/2017   Procedure: ESOPHAGOGASTRODUODENOSCOPY (EGD) WITH PROPOFOL;  Surgeon: Daneil Dolin, MD;  [facility];  Service: Endoscopy;  Laterality: N/A;  . EXCISION METACARPAL MASS Left 03/03/2016   Procedure: EXCISION OF LEFT  DORSAL MASS OF EXTENSOR TENDON;  Surgeon: Milly Jakob, MD;  Location: [facility];  Service: Orthopedics;  Laterality: Left;  . EYE SURGERY     bilateral cataract surgery,   . HIP ARTHROPLASTY     right  . JOINT REPLACEMENT    . KNEE SURGERY     torn cartilage repair  . MALONEY DILATION N/A 09/19/2014   Procedure: Venia Minks  DILATION;  Surgeon: Daneil Dolin, MD;  [facility];  Service: Endoscopy;  Laterality: N/A;  . ORTHOPEDIC SURGERY    . POLYPECTOMY  05/18/2017   Procedure: POLYPECTOMY;  Surgeon: Daneil Dolin, MD;  [facility];  Service: Endoscopy;;  polyp at hepatic flexure, descending colon  . ROTATOR CUFF REPAIR     "multiple in both shoulders" and total shoulder replacement in left arm  . SAVORY DILATION N/A 09/19/2014   Procedure: SAVORY DILATION;  Surgeon: Daneil Dolin, MD;  [facility];  Service: Endoscopy;  Laterality: N/A;  . TONSILLECTOMY      FAMILY HISTORY: Family History  Problem Relation Age of Onset  . Cirrhosis Brother        etoh  . Colon cancer Neg Hx     SOCIAL HISTORY: Social History   Social History  . Marital status: Widowed    Spouse name: N/A  . Number of children: N/A  . Years of education: N/A   Occupational History  . Not on file.   Social History Main Topics  . Smoking status: Former Smoker    Quit date: 05/13/1974  . Smokeless tobacco: Never Used     Comment: stopped 25 years ago  . Alcohol use No  . Drug use: No  . Sexual activity: Not on file   Other Topics Concern  . Not on file   Social History Narrative  . No narrative on file     PHYSICAL EXAM  Vitals:   08/04/17 0735  BP: (!) 142/68  Pulse: 93  Weight: 212 lb (96.2 kg)  Height: 5' 5"  (1.651 m)   Body mass index is 35.28 kg/m.  Generalized: Well developed,Obese female in  no acute distress  Head: normocephalic and atraumatic,. Oropharynx benign  Neck: Supple, no carotid bruits  Cardiac: Regular rate rhythm, no murmur  Musculoskeletal: No deformity   Neurological examination   Mentation: Alert oriented to time, place, history taking. Attention span and concentration appropriate.  Follows all commands speech and language fluent.   Cranial nerve II-XII: Pupils were equal round reactive to light extraocular movements were full, visual field  were full on confrontational test. Facial sensation and strength were normal. hearing was intact to finger rubbing bilaterally. Uvula tongue midline. head turning and shoulder shrug were normal and symmetric.Tongue protrusion into cheek strength was normal. Motor: normal bulk and tone, full strength in the BUE, BLE,   Sensory: normal and symmetric to light touch, pinprick, and  Vibration, in the upper and lower extremities  Coordination: finger-nose-finger, heel-to-shin bilaterally, no dysmetria Reflexes: 1+ upper lower and symmetric plantar responses were flexor bilaterally. Gait and Station: Rising up from seated position without assistance, normal stance,  moderate stride, good arm swing, smooth turning, able to perform tiptoe, and heel walking without difficulty. Tandem gait is steady. No assistive device  DIAGNOSTIC DATA (LABS, IMAGING, TESTING) - I reviewed patient records, labs, notes, testing and imaging myself where available.  Lab Results  Component Value Date   WBC 19.6 (H) 07/09/2017   HGB 9.5 (L) 07/09/2017   HCT 30.1 (L) 07/09/2017   MCV 75.7 (L) 07/09/2017   PLT 266 07/09/2017      Component Value Date/Time   NA 141 07/09/2017 0846   K 4.3 07/09/2017 0846   CL 100 (L) 05/13/2017 1050   CO2 26 07/09/2017 0846   GLUCOSE 183 (H) 07/09/2017 0846   BUN 15.8 07/09/2017 0846   CREATININE 1.0 07/09/2017 0846   CALCIUM 9.6 07/09/2017 0846   PROT 7.3 07/09/2017 0846   PROT 6.9 07/09/2017 0846   ALBUMIN 3.3 (L) 07/09/2017 0846   AST 12 07/09/2017 0846   ALT 13 07/09/2017 0846   ALKPHOS 85 07/09/2017 0846   BILITOT 0.38 07/09/2017 0846   GFRNONAA 53 (L) 05/13/2017 1050   GFRAA >60 05/13/2017 1050   Lab Results  Component Value Date   CHOL 202 (H) 07/30/2016   HDL 28 (L) 07/30/2016   LDLCALC 122 (H) 07/30/2016   TRIG 259 (H) 07/30/2016   CHOLHDL 7.2 07/30/2016  Lab Results  Component Value Date   HGBA1C 8.3 (H) 07/30/2016    ASSESSMENT AND PLAN  76 y.o. year  old female  has a past medical history of Arthritis; B12 deficiency;  Diabetes mellitus; Hyperlipidemia; Hypertension;  right ACA territory infarct . She is here for Stroke  follow-up. The patient is a current patient of Dr. Erlinda Hong  who is out of the office today . This note is sent to the work in doctor. She has new complaint today of daytime drowsiness and snoring.   PLAN: Stressed the importance of continued management of risk factors to prevent further stroke Continue Plavix for secondary stroke prevention Maintain strict control of hypertension with blood pressure goal below 130/90, today's reading 142/68 continue antihypertensive medications Control of diabetes with hemoglobin A1c below 6.5 followed by primary care most recent hemoglobin A1c 6.6 continue diabetic medications Cholesterol with LDL cholesterol less than 70, continue statin drugs lipids followed by PCP Exercise by walking, ,  eat healthy diet   fresh fruits and vegetables, lean meats  Continue cognitive activities include cross words strategy games, exercise Discharge from stroke clinic I spent 25 min in total face to face time with the patient more than 50% of which was spent counseling and coordination of care, reviewing test results reviewing medications and discussing and reviewing the diagnosis of stroke and management of risk factors. Written information given and reviewed Portions of the above document were copied from prior visit for review purposes only Dennie Bible, [facility], [facility], APRN  [facility] [address], [place], [place] [address] [phone]

## 2017-08-04 ENCOUNTER — Encounter: Payer: Self-pay | Admitting: Nurse Practitioner

## 2017-08-04 ENCOUNTER — Ambulatory Visit (INDEPENDENT_AMBULATORY_CARE_PROVIDER_SITE_OTHER): Payer: Medicare Other | Admitting: Nurse Practitioner

## 2017-08-04 VITALS — BP 142/68 | HR 93 | Ht 65.0 in | Wt 212.0 lb

## 2017-08-04 DIAGNOSIS — E785 Hyperlipidemia, unspecified: Secondary | ICD-10-CM

## 2017-08-04 DIAGNOSIS — I10 Essential (primary) hypertension: Secondary | ICD-10-CM | POA: Diagnosis not present

## 2017-08-04 DIAGNOSIS — Z8673 Personal history of transient ischemic attack (TIA), and cerebral infarction without residual deficits: Secondary | ICD-10-CM

## 2017-08-04 DIAGNOSIS — Z794 Long term (current) use of insulin: Secondary | ICD-10-CM

## 2017-08-04 DIAGNOSIS — E119 Type 2 diabetes mellitus without complications: Secondary | ICD-10-CM | POA: Diagnosis not present

## 2017-08-04 NOTE — Progress Notes (Signed)
I reviewed above note and agree with the assessment and plan.  Rosalin Hawking, MD PhD Stroke Neurology 08/04/2017 10:55 PM

## 2017-08-04 NOTE — Patient Instructions (Addendum)
Stressed the importance of continued management of risk factors to prevent further stroke Continue Plavix for secondary stroke prevention Maintain strict control of hypertension with blood pressure goal below 130/90, today's reading 142/68 continue antihypertensive medications Control of diabetes with hemoglobin A1c below 6.5 followed by primary care most recent hemoglobin A1c 6.6 continue diabetic medications Cholesterol with LDL cholesterol less than 70, continue statin drugs lipids followed by PCP Exercise by walking, ,  eat healthy diet   fresh fruits and vegetables, lean meats  Continue cognitive activities include cross words strategy games, exercise Discharge from stroke clinic  Stroke Prevention Some health problems and behaviors may make it more likely for you to have a stroke. Below are ways to lessen your risk of having a stroke.  Be active for at least 30 minutes on most or all days.  Do not smoke. Try not to be around others who smoke.  Do not drink too much alcohol. ? Do not have more than 2 drinks a day if you are a man. ? Do not have more than 1 drink a day if you are a woman and are not pregnant.  Eat healthy foods, such as fruits and vegetables. If you were put on a specific diet, follow the diet as told.  Keep your cholesterol levels under control through diet and medicines. Look for foods that are low in saturated fat, trans fat, cholesterol, and are high in fiber.  If you have diabetes, follow all diet plans and take your medicine as told.  Ask your doctor if you need treatment to lower your blood pressure. If you have high blood pressure (hypertension), follow all diet plans and take your medicine as told by your doctor.  If you are 64-4 years old, have your blood pressure checked every 3-5 years. If you are age 100 or older, have your blood pressure checked every year.  Keep a healthy weight. Eat foods that are low in calories, salt, saturated fat, trans fat, and  cholesterol.  Do not take drugs.  Avoid birth control pills, if this applies. Talk to your doctor about the risks of taking birth control pills.  Talk to your doctor if you have sleep problems (sleep apnea).  Take all medicine as told by your doctor. ? You may be told to take aspirin or blood thinner medicine. Take this medicine as told by your doctor. ? Understand your medicine instructions.  Make sure any other conditions you have are being taken care of.  Get help right away if:  You suddenly lose feeling (you feel numb) or have weakness in your face, arm, or leg.  Your face or eyelid hangs down to one side.  You suddenly feel confused.  You have trouble talking (aphasia) or understanding what people are saying.  You suddenly have trouble seeing in one or both eyes.  You suddenly have trouble walking.  You are dizzy.  You lose your balance or your movements are clumsy (uncoordinated).  You suddenly have a very bad headache and you do not know the cause.  You have new chest pain.  Your heart feels like it is fluttering or skipping a beat (irregular heartbeat). Do not wait to see if the symptoms above go away. Get help right away. Call your local emergency services (911 in [place] Do not drive yourself to the hospital. This information is not intended to replace advice given to you by your health care provider. Make sure you discuss any questions you have with your  health care provider. Document Released: 04/20/2012 Document Revised: 03/27/2016 Document Reviewed: 04/22/2013 Elsevier Interactive Patient Education  [employer].

## 2017-08-11 ENCOUNTER — Ambulatory Visit: Payer: Medicare Other | Admitting: Nurse Practitioner

## 2017-08-13 ENCOUNTER — Telehealth: Payer: Self-pay | Admitting: *Deleted

## 2017-08-13 NOTE — Telephone Encounter (Signed)
"  Sharyn Lull with [employer] [phone] ext [phone]).  Calling in reference to the 13-page forms sent for provider clarification of diagnosis.  Please call to confirm receipt or to let us know if we need to refax it.  Faxed original on July 16, 2017 to fax number [phone]."  Routing call information to collaborative nurse and provider for review.  Further patient communication through collaborative nurse.

## 2017-08-14 ENCOUNTER — Telehealth: Payer: Self-pay

## 2017-08-14 NOTE — Telephone Encounter (Signed)
Received two calls regarding 13-page document faxed from [employer] on 07/16/17. This RN does not have awareness that this document was received. No one available at [employer] at the number [phone] ext [phone] to answer. Left VM to send paperwork again to fax [phone] and that it would be forwarded to the appropriate staff member for completion. The VM prompt stated, "Please leave a message and we will get back to you within 24 hours."

## 2017-08-19 NOTE — Telephone Encounter (Signed)
See 08-14-2017 Phone note.

## 2017-09-14 DIAGNOSIS — E1129 Type 2 diabetes mellitus with other diabetic kidney complication: Secondary | ICD-10-CM | POA: Diagnosis not present

## 2017-09-22 DIAGNOSIS — E1129 Type 2 diabetes mellitus with other diabetic kidney complication: Secondary | ICD-10-CM | POA: Diagnosis not present

## 2017-09-22 DIAGNOSIS — Z23 Encounter for immunization: Secondary | ICD-10-CM | POA: Diagnosis not present

## 2017-09-22 DIAGNOSIS — I10 Essential (primary) hypertension: Secondary | ICD-10-CM | POA: Diagnosis not present

## 2017-09-22 DIAGNOSIS — R040 Epistaxis: Secondary | ICD-10-CM | POA: Diagnosis not present

## 2017-10-13 DIAGNOSIS — L821 Other seborrheic keratosis: Secondary | ICD-10-CM | POA: Diagnosis not present

## 2017-10-13 DIAGNOSIS — L570 Actinic keratosis: Secondary | ICD-10-CM | POA: Diagnosis not present

## 2017-10-13 DIAGNOSIS — D229 Melanocytic nevi, unspecified: Secondary | ICD-10-CM | POA: Diagnosis not present

## 2017-10-14 ENCOUNTER — Other Ambulatory Visit (HOSPITAL_BASED_OUTPATIENT_CLINIC_OR_DEPARTMENT_OTHER): Payer: Medicare Other

## 2017-10-14 ENCOUNTER — Other Ambulatory Visit: Payer: Medicare Other

## 2017-10-14 ENCOUNTER — Ambulatory Visit (HOSPITAL_BASED_OUTPATIENT_CLINIC_OR_DEPARTMENT_OTHER): Payer: Medicare Other | Admitting: Hematology

## 2017-10-14 ENCOUNTER — Telehealth: Payer: Self-pay | Admitting: Hematology

## 2017-10-14 ENCOUNTER — Ambulatory Visit: Payer: Medicare Other | Admitting: Hematology

## 2017-10-14 ENCOUNTER — Encounter: Payer: Self-pay | Admitting: Hematology

## 2017-10-14 VITALS — BP 121/41 | HR 79 | Temp 98.7°F | Resp 18 | Ht 65.0 in | Wt 208.9 lb

## 2017-10-14 DIAGNOSIS — E538 Deficiency of other specified B group vitamins: Secondary | ICD-10-CM | POA: Diagnosis not present

## 2017-10-14 DIAGNOSIS — C9190 Lymphoid leukemia, unspecified not having achieved remission: Secondary | ICD-10-CM | POA: Diagnosis not present

## 2017-10-14 DIAGNOSIS — C9110 Chronic lymphocytic leukemia of B-cell type not having achieved remission: Secondary | ICD-10-CM

## 2017-10-14 DIAGNOSIS — D649 Anemia, unspecified: Secondary | ICD-10-CM

## 2017-10-14 DIAGNOSIS — D509 Iron deficiency anemia, unspecified: Secondary | ICD-10-CM

## 2017-10-14 DIAGNOSIS — D472 Monoclonal gammopathy: Secondary | ICD-10-CM

## 2017-10-14 DIAGNOSIS — E539 Vitamin B deficiency, unspecified: Secondary | ICD-10-CM | POA: Diagnosis not present

## 2017-10-14 DIAGNOSIS — D500 Iron deficiency anemia secondary to blood loss (chronic): Secondary | ICD-10-CM

## 2017-10-14 LAB — CBC & DIFF AND RETIC
BASO%: 0.2 % (ref 0.0–2.0)
Basophils Absolute: 0 10*3/uL (ref 0.0–0.1)
EOS%: 0.5 % (ref 0.0–7.0)
Eosinophils Absolute: 0.1 10*3/uL (ref 0.0–0.5)
HEMATOCRIT: 35 % (ref 34.8–46.6)
HGB: 11.2 g/dL — ABNORMAL LOW (ref 11.6–15.9)
Immature Retic Fract: 10.3 % — ABNORMAL HIGH (ref 1.60–10.00)
LYMPH#: 16.2 10*3/uL — AB (ref 0.9–3.3)
LYMPH%: 79.8 % — ABNORMAL HIGH (ref 14.0–49.7)
MCH: 27.1 pg (ref 25.1–34.0)
MCHC: 32 g/dL (ref 31.5–36.0)
MCV: 84.5 fL (ref 79.5–101.0)
MONO#: 0.1 10*3/uL (ref 0.1–0.9)
MONO%: 0.4 % (ref 0.0–14.0)
NEUT#: 3.9 10*3/uL (ref 1.5–6.5)
NEUT%: 19.1 % — ABNORMAL LOW (ref 38.4–76.8)
PLATELETS: 238 10*3/uL (ref 145–400)
RBC: 4.13 10*6/uL (ref 3.70–5.45)
RDW: 19.9 % — AB (ref 11.2–14.5)
RETIC %: 1.36 % (ref 0.70–2.10)
RETIC CT ABS: 56.17 10*3/uL (ref 33.70–90.70)
WBC: 20.3 10*3/uL — ABNORMAL HIGH (ref 3.9–10.3)

## 2017-10-14 LAB — COMPREHENSIVE METABOLIC PANEL
ALBUMIN: 3.6 g/dL (ref 3.5–5.0)
ALK PHOS: 96 U/L (ref 40–150)
ALT: 16 U/L (ref 0–55)
ANION GAP: 12 meq/L — AB (ref 3–11)
AST: 14 U/L (ref 5–34)
BILIRUBIN TOTAL: 0.45 mg/dL (ref 0.20–1.20)
BUN: 21.7 mg/dL (ref 7.0–26.0)
CALCIUM: 9.3 mg/dL (ref 8.4–10.4)
CO2: 24 mEq/L (ref 22–29)
CREATININE: 0.9 mg/dL (ref 0.6–1.1)
Chloride: 103 mEq/L (ref 98–109)
EGFR: 60 mL/min/{1.73_m2} — AB (ref >60)
Glucose: 106 mg/dl (ref 70–140)
Potassium: 4.6 mEq/L (ref 3.5–5.1)
Sodium: 139 mEq/L (ref 136–145)
TOTAL PROTEIN: 7.7 g/dL (ref 6.4–8.3)

## 2017-10-14 LAB — TECHNOLOGIST REVIEW

## 2017-10-14 LAB — FERRITIN: FERRITIN: 275 ng/mL — AB (ref 9–269)

## 2017-10-14 LAB — LACTATE DEHYDROGENASE: LDH: 196 U/L (ref 125–245)

## 2017-10-14 NOTE — Progress Notes (Signed)
Marland Kitchen    HEMATOLOGY/ONCOLOGY CLINIC NOTE  Date of Service:  10/14/2017   Patient Care Team: Asencion Noble, MD as PCP - General Rourk, [name], MD as Consulting Physician (Gastroenterology)  CHIEF COMPLAINTS/PURPOSE OF CONSULTATION:  F/u for B cell lymphoproliferative disorder.  HISTORY OF PRESENTING ILLNESS:  Theresa Kramer is a wonderful 76 y.o. female who has been referred to [place] by Dr Asencion Noble, MD for evaluation and management of lymphocytosis.  Patient has a h/o B12 def, depression, arthritis and DDD and was recently admitted to the hospital with TIA vs delirium in the setting of UTI. 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 She is here for further evaluation of her newly diagnosed B-cell leukemia/lymphoma. Patient reports no fevers. Has some night sweats but those have been in the setting of hypoglycemia. No overt LNadenopathy. No abdominal pain or distension. No reported weight loss. Has chronic fatigue that hasnt significantly changed recently.   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 Her daughter accompanies her. Her daughter reports that the patient's general health is doing well. Her HGB is at 11.2 as of today. Iron studies show resolution of IDA. She denies issues with the IV iron at this time. Daughter reports that the patient has an eye exam upcoming as well. Daughter reports that the patient has had both pneumonia vaccine, flu vaccination.  Daughter reports that on 05/18/2017, the patient underwent a colonoscopy and endoscopy performed by Dr. Manus Rudd with results significant for: 1. Stomach, biopsy with antral mucosa with mild hyperemia. Warthin stain negative for Helicobacter pylori. No intestinal metaplasia, dysplasia or malignancy. 2. Colon, polyp(s), hepatic flexure with tubular adenoma (one  fragment). No high grade dysplasia or malignancy. 3. Colon, polyp(s), descending with tubular adenoma (one fragment). No high grade dysplasia or malignancy.   On review of systems, daughter notes that the pt has had consistent nosebleeds while on Plavix and has an ENT follow up on next week. Pt states that her nose feels dry and crusty and that the patient averages 1 nosebleed a week. She has had a colonoscopy and endoscopy completed in July 2018 by Dr. Manus Rudd. She denies fever, chills, night sweats, decreased appetite, or decreased PO intake.      MEDICAL HISTORY:  Past Medical History:  Diagnosis Date  . Arthritis   . B12 deficiency   . Bronchitis    hx of  . Cancer ([place])   . Complication of anesthesia   . Depression    "not taking medication"  . Diabetes mellitus   . GERD (gastroesophageal reflux disease)   . H/O hiatal hernia   . Hyperlipidemia   . Hypertension   . Noncompliance with medication regimen   . Pinched cervical nerve root   . PONV (postoperative nausea and vomiting)   . Stroke ([place])   . Urinary tract infection    hx of    SURGICAL HISTORY: Past Surgical History:  Procedure Laterality Date  . ABDOMINAL HYSTERECTOMY    . ANTERIOR CERVICAL DECOMP/DISCECTOMY FUSION  03/15/2012   Procedure: ANTERIOR CERVICAL DECOMPRESSION/DISCECTOMY FUSION 2 LEVELS;  Surgeon: Ophelia Charter, MD;  Location: [place] [facility];  Service: Neurosurgery;  Laterality: N/A;  Cervical four-five Cervical five six anterior cervical decompression with fusion interbody prothesis plating and bonegraft  . APPENDECTOMY    . BIOPSY  05/18/2017   Procedure:  BIOPSY;  Surgeon: Daneil Dolin, MD;  [facility];  Service: Endoscopy;;  gastric bx's  . BONE CYST EXCISION     from lower back and foot  . CHOLECYSTECTOMY    . COLONOSCOPY  2011   Dr. Gala Romney: prep compromised exam. diverticulosis, hemorrhoids. next tcs 2021  . COLONOSCOPY WITH PROPOFOL N/A 05/18/2017   Procedure: COLONOSCOPY  WITH PROPOFOL;  Surgeon: Daneil Dolin, MD;  [facility];  Service: Endoscopy;  Laterality: N/A;  1100  . DILATION AND CURETTAGE OF UTERUS    . 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 Multiple distal rings dilated and disrupted as described above. Small hiatal hernia.   Marland Kitchen ESOPHAGOGASTRODUODENOSCOPY (EGD) WITH PROPOFOL N/A 05/18/2017   Procedure: ESOPHAGOGASTRODUODENOSCOPY (EGD) WITH PROPOFOL;  Surgeon: Daneil Dolin, MD;  [facility];  Service: Endoscopy;  Laterality: N/A;  . EXCISION METACARPAL MASS Left 03/03/2016   Procedure: EXCISION OF LEFT  DORSAL MASS OF EXTENSOR TENDON;  Surgeon: Milly Jakob, MD;  Location: [facility];  Service: Orthopedics;  Laterality: Left;  . EYE SURGERY     bilateral cataract surgery,   . HIP ARTHROPLASTY     right  . JOINT REPLACEMENT    . KNEE SURGERY     torn cartilage repair  . MALONEY DILATION N/A 09/19/2014   Procedure: Venia Minks DILATION;  Surgeon: Daneil Dolin, MD;  [facility];  Service: Endoscopy;  Laterality: N/A;  . ORTHOPEDIC SURGERY    . POLYPECTOMY  05/18/2017   Procedure: POLYPECTOMY;  Surgeon: Daneil Dolin, MD;  [facility];  Service: Endoscopy;;  polyp at hepatic flexure, descending colon  . ROTATOR CUFF REPAIR     "multiple in both shoulders" and total shoulder replacement in left arm  . SAVORY DILATION N/A 09/19/2014   Procedure: SAVORY DILATION;  Surgeon: Daneil Dolin, MD;  [facility];  Service: Endoscopy;  Laterality: N/A;  . TONSILLECTOMY      SOCIAL HISTORY: Social History   Socioeconomic History  . Marital status: Widowed    Spouse name: Not on file  . Number of children: Not on file  . Years of education: Not on file  . Highest education level: Not on file  Social Needs  . Financial resource strain: Not on file  . Food insecurity - worry: Not on file  . Food  insecurity - inability: Not on file  . Transportation needs - medical: Not on file  . Transportation needs - non-medical: Not on file  Occupational History  . Not on file  Tobacco Use  . Smoking status: Former Smoker    Last attempt to quit: 05/13/1974    Years since quitting: 43.4  . Smokeless tobacco: Never Used  . Tobacco comment: stopped 25 years ago  Substance and Sexual Activity  . Alcohol use: No  . Drug use: No  . Sexual activity: Not on file  Other Topics Concern  . Not on file  Social History Narrative  . Not on file    FAMILY HISTORY: Family History  Problem Relation Age of Onset  . Cirrhosis Brother        etoh  . Colon cancer Neg Hx     ALLERGIES:  is allergic to meloxicam.  MEDICATIONS:  Current Outpatient Medications  Medication Sig Dispense Refill  . acetaminophen (TYLENOL ARTHRITIS PAIN) 650 MG CR tablet Take 1,300 mg by mouth 2 (two) times daily.     Marland Kitchen  amLODipine (NORVASC) 5 MG tablet Take 5 mg by mouth at bedtime.     . citalopram (CELEXA) 20 MG tablet Take 20 mg by mouth daily.      . clopidogrel (PLAVIX) 75 MG tablet TAKE 1 TABLET BY MOUTH  DAILY 90 tablet 3  . clotrimazole (LOTRIMIN) 1 % cream Apply 1 application topically 2 (two) times daily as needed. For rash     . cyanocobalamin (,VITAMIN B-12,) 1000 MCG/ML injection Inject 1,000 mcg into the muscle every 30 (thirty) days. Usually on the 1st     . glipiZIDE (GLUCOTROL XL) 5 MG 24 hr tablet Take 5 mg by mouth daily.      . insulin glargine (LANTUS) 100 UNIT/ML injection Inject 55 Units into the skin at bedtime.     . insulin lispro (HUMALOG) 100 UNIT/ML injection Inject 8 Units into the skin 3 (three) times daily before meals.     Marland Kitchen LORazepam (ATIVAN) 0.5 MG tablet Take 0.5 mg by mouth 2 (two) times daily. Breakfast and after lunch    . losartan-hydrochlorothiazide (HYZAAR) 100-25 MG tablet Take 1 tablet by mouth daily.    . metFORMIN (GLUCOPHAGE) 1000 MG tablet Take 500 mg by mouth 2 (two) times  daily with a meal. After lunch and bedtime    . oxybutynin (DITROPAN-XL) 10 MG 24 hr tablet Take 10 mg by mouth at bedtime.     . pantoprazole (PROTONIX) 40 MG tablet Take 40 mg by mouth daily.    . pramipexole (MIRAPEX) 0.25 MG tablet Take 0.25 mg by mouth at bedtime.     . pravastatin (PRAVACHOL) 80 MG tablet Take 1 tablet (80 mg total) by mouth daily. (Patient taking differently: Take 80 mg by mouth at bedtime. ) 30 tablet 3  . ranitidine (ZANTAC) 150 MG tablet Take 150 mg by mouth at bedtime.    . risperiDONE (RISPERDAL) 0.5 MG tablet Take 0.5 mg by mouth 2 (two) times daily. Breakfast and lunch    . traMADol (ULTRAM) 50 MG tablet Take 1 tablet (50 mg total) by mouth every 6 (six) hours as needed for moderate pain. 20 tablet 0  . traZODone (DESYREL) 50 MG tablet Take 50 mg by mouth at bedtime.     No current facility-administered medications for this visit.     REVIEW OF SYSTEMS:    10 Point review of Systems was done is negative except as noted above.  PHYSICAL EXAMINATION: ECOG PERFORMANCE STATUS: 2 - Symptomatic, <50% confined to bed  . Vitals:   10/14/17 0852  BP: (!) 121/41  Pulse: 79  Resp: 18  Temp: 98.7 F (37.1 C)  SpO2: 97%   Filed Weights   10/14/17 0852  Weight: 208 lb 14.4 oz (94.8 kg)   .Body mass index is 34.76 kg/m.  GENERAL: Alert, in no acute distress and comfortable  SKIN: skin color, texture, turgor are normal, no rashes or significant lesions EYES: normal, conjunctiva are pink and non-injected, sclera clear OROPHARYNX:no exudate, no erythema and lips, buccal mucosa, and tongue normal  NECK: supple, no JVD, thyroid normal size, non-tender, without nodularity LYMPH:  no palpable lymphadenopathy in the cervical, axillary or inguinal LUNGS: clear to auscultation with normal respiratory effort HEART: regular rate & rhythm,  no murmurs and no lower extremity edema ABDOMEN: abdomen soft, non-tender, normoactive bowel sounds , no palpable  hepatosplenomegaly. Musculoskeletal: no cyanosis of digits and no clubbing  PSYCH: alert & oriented x 3 with fluent speech NEURO: no focal motor/sensory deficits  LABORATORY DATA:  I have reviewed the data as listed  . CBC Latest Ref Rng & Units 10/14/2017 07/09/2017 05/13/2017  WBC 3.9 - 10.3 10e3/uL 20.3(H) 19.6(H) 24.1(H)  Hemoglobin 11.6 - 15.9 g/dL 11.2(L) 9.5(L) 9.6(L)  Hematocrit 34.8 - 46.6 % 35.0 30.1(L) 31.9(L)  Platelets 145 - 400 10e3/uL 238 266 324   . CBC    Component Value Date/Time   WBC 20.3 (H) 10/14/2017 0824   WBC 24.1 (H) 05/13/2017 1050   RBC 4.13 10/14/2017 0824   RBC 3.96 05/13/2017 1050   HGB 11.2 (L) 10/14/2017 0824   HCT 35.0 10/14/2017 0824   PLT 238 10/14/2017 0824   MCV 84.5 10/14/2017 0824   MCH 27.1 10/14/2017 0824   MCH 24.2 (L) 05/13/2017 1050   MCHC 32.0 10/14/2017 0824   MCHC 30.1 05/13/2017 1050   RDW 19.9 (H) 10/14/2017 0824   LYMPHSABS 16.2 (H) 10/14/2017 0824   MONOABS 0.1 10/14/2017 0824   EOSABS 0.1 10/14/2017 0824   BASOSABS 0.0 10/14/2017 0824     . CMP Latest Ref Rng & Units 07/09/2017 07/09/2017 05/13/2017  Glucose 70 - 140 mg/dl 183(H) - 147(H)  BUN 7.0 - 26.0 mg/dL 15.8 - 28(H)  Creatinine 0.6 - 1.1 mg/dL 1.0 - 1.00  Sodium 136 - 145 mEq/L 141 - 138  Potassium 3.5 - 5.1 mEq/L 4.3 - 4.2  Chloride 101 - 111 mmol/L - - 100(L)  CO2 22 - 29 mEq/L 26 - 27  Calcium 8.4 - 10.4 mg/dL 9.6 - 9.3  Total Protein 6.0 - 8.5 g/dL 7.3 6.9 -  Total Bilirubin 0.20 - 1.20 mg/dL 0.38 - -  Alkaline Phos 40 - 150 U/L 85 - -  AST 5 - 34 U/L 12 - -  ALT 0 - 55 U/L 13 - -    Lab Results  Component Value Date   FERRITIN 275 (H) 10/14/2017             . Lab Results  Component Value Date   LDH 196 10/14/2017         RADIOGRAPHIC STUDIES: I have personally reviewed the radiological images as listed and agreed with the findings in the report. No results found.  PET/CT 10/25/017:  IMPRESSION: 1. Mildly enlarged and mildly  hypermetabolic spleen, consistent with the provided history of a lymphoproliferative disorder. 2. No hypermetabolic lymphadenopathy or other hypermetabolic sites of disease. 3. Additional findings include aortic atherosclerosis, three-vessel coronary atherosclerosis, small hiatal hernia and small fat containing left inguinal hernia.   Electronically Signed   By: Ilona Sorrel M.D.   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 Noted to have elevated wbc/lymphocyte counts end of September. Unknown if she had a more chronic elevation of her WBC counts that might put a timeline on her lymphocytosis. -Differential diagnosis- CD5 neg variant of CLL (about 17-20% patients with CLL could be CD5neg) vs B-cell leukemia/lymphoma -PET/CT with no significant LNadenopathy and PET/CT with minimal enlarged and minimally hypermetabolic. FISH shows Trisomy 37 which would be consistent with CLL LDH WNL -Previously, patient WBC is at 19.6K slightly down from 21k with around 15.1k lymphocytes. LDH WNL  Features consistent with indolent chronic lymphoproliferative process - likely CLL  -Patient WBC is at 20.3K today, slightly elevated from 19.6K 3 months ago with around 16.2K lymphocytes.  -LDH WNL  -Iron studies now show adequate  -follow  up in 4 months    2) Anemia - microcytic likely related to iron deficiency and less likely to be related to CLL.  Resolving after IViron Plan -iron levels normalized -no indication for IV iron at this time -continue Polysaccharide iron 150mg  po BID. She also has been taking Protonix . 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 Brings up the possibility of  lymphoplasmacytic lymphoma however trisomy 12 is typical for CLL and uncommon in LPL/WM M spike 0.4g/dl unchanged  4). Patient Active Problem List   Diagnosis Date Noted  . History of stroke 08/04/2017  . IDA (iron deficiency anemia) 03/25/2017  . Somnolence, daytime 12/31/2016  . Snoring 12/31/2016  . Occipital neuralgia   . Acute ischemic stroke ([place])   . Stroke ([place])   . 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 Leukocytosis 08/01/2016  . Urinary tract infectious disease 08/01/2016  . Type 2 diabetes mellitus without complication, with long-term current use of insulin ([place]) 07/30/2016  . Essential hypertension 07/30/2016  . Osteoarthritis 07/30/2016  . Normocytic anemia 07/30/2016  . HLD (hyperlipidemia)   . Schatzki's ring   . Cervical herniated disc 03/15/2012  . GERD 04/29/2010  . CHEST PAIN, ATYPICAL 04/29/2010  . DYSPHAGIA UNSPECIFIED 04/29/2010  -continue rf/u with PCP.  RTC with Dr Irene Limbo in 4 months with labs   All of the patients questions were answered with apparent satisfaction. The patient knows to call the clinic with any problems, questions or concerns.   I spent 20 minutes counseling the patient face to face. The total time spent in the appointment was 25 minutes and more than 50% was on counseling and direct patient cares.  This document serves as a record of services personally performed by Sullivan Lone, MD. It was created on his behalf by Steva Colder, a trained medical scribe. The creation of this record is based on the scribe's personal observations and the provider's statements to them.   .I have reviewed the above documentation for accuracy and completeness, and I agree with the above. Brunetta Genera MD   Sullivan Lone MD [place] AAHIVMS [facility] Hematology/Oncology Physician [facility]  (Office):       [phone] (Work cell):  [phone] (Fax):            [phone]

## 2017-10-14 NOTE — Patient Instructions (Signed)
Thank you for choosing [facility] to provide your oncology and hematology care.  To afford each patient quality time with our providers, please arrive 30 minutes before your scheduled appointment time.  If you arrive late for your appointment, you may be asked to reschedule.  We strive to give you quality time with our providers, and arriving late affects you and other patients whose appointments are after yours.   If you are a no show for multiple scheduled visits, you may be dismissed from the clinic at the providers discretion.    Again, thank you for choosing [facility], our hope is that these requests will decrease the amount of time that you wait before being seen by our physicians.  ______________________________________________________________________  Should you have questions after your visit to the [facility], please contact our office at [phone] between the hours of 8:30 and 4:30 p.m.    Voicemails left after 4:30p.m will not be returned until the following business day.    For prescription refill requests, please have your pharmacy contact us directly.  Please also try to allow 48 hours for prescription requests.    Please contact the scheduling department for questions regarding scheduling.  For scheduling of procedures such as PET scans, CT scans, MRI, Ultrasound, etc please contact central scheduling at [phone].    Resources For Cancer Patients and Caregivers:   Oncolink.org:  A wonderful resource for patients and healthcare providers for information regarding your disease, ways to tract your treatment, what to expect, etc.     American Cancer Society:  800-227-2345  Can help patients locate various types of support and financial assistance  Cancer Care: 1-800-813-HOPE (4673) Provides financial assistance, online support groups, medication/co-pay assistance.    [place] DSS:  336-641-3447 Where to apply for food  stamps, Medicaid, and utility assistance  Medicare Rights Center: 800-333-4114 Helps people with Medicare understand their rights and benefits, navigate the Medicare system, and secure the quality healthcare they deserve  SCAT: 336-333-6589 [place] [employer]'s shared-ride transportation service for eligible riders who have a disability that prevents them from riding the fixed route bus.    For additional information on assistance programs please contact our social worker:   Grier Hock/Abigail Elmore:  [phone]            

## 2017-10-14 NOTE — Telephone Encounter (Signed)
Gave avs and calendar for April 2019 °

## 2017-10-15 LAB — VITAMIN B12: Vitamin B12: 739 pg/mL (ref 232–1245)

## 2017-10-15 LAB — KAPPA/LAMBDA LIGHT CHAINS
IG KAPPA FREE LIGHT CHAIN: 19.6 mg/L — AB (ref 3.3–19.4)
Ig Lambda Free Light Chain: 120.4 mg/L — ABNORMAL HIGH (ref 5.7–26.3)
KAPPA/LAMBDA FLC RATIO: 0.16 — AB (ref 0.26–1.65)

## 2017-10-19 DIAGNOSIS — Z961 Presence of intraocular lens: Secondary | ICD-10-CM | POA: Diagnosis not present

## 2017-10-19 DIAGNOSIS — H52203 Unspecified astigmatism, bilateral: Secondary | ICD-10-CM | POA: Diagnosis not present

## 2017-10-19 DIAGNOSIS — E119 Type 2 diabetes mellitus without complications: Secondary | ICD-10-CM | POA: Diagnosis not present

## 2017-10-19 DIAGNOSIS — Z794 Long term (current) use of insulin: Secondary | ICD-10-CM | POA: Diagnosis not present

## 2017-10-19 DIAGNOSIS — H524 Presbyopia: Secondary | ICD-10-CM | POA: Diagnosis not present

## 2017-10-19 LAB — MULTIPLE MYELOMA PANEL, SERUM
ALBUMIN SERPL ELPH-MCNC: 3.4 g/dL (ref 2.9–4.4)
ALBUMIN/GLOB SERPL: 1 (ref 0.7–1.7)
ALPHA 1: 0.2 g/dL (ref 0.0–0.4)
Alpha2 Glob SerPl Elph-Mcnc: 1.3 g/dL — ABNORMAL HIGH (ref 0.4–1.0)
B-GLOBULIN SERPL ELPH-MCNC: 1.1 g/dL (ref 0.7–1.3)
GAMMA GLOB SERPL ELPH-MCNC: 1 g/dL (ref 0.4–1.8)
GLOBULIN, TOTAL: 3.6 g/dL (ref 2.2–3.9)
IGA/IMMUNOGLOBULIN A, SERUM: 156 mg/dL (ref 64–422)
IgG, Qn, Serum: 803 mg/dL (ref 700–1600)
IgM, Qn, Serum: 472 mg/dL — ABNORMAL HIGH (ref 26–217)
M PROTEIN SERPL ELPH-MCNC: 0.4 g/dL — AB
Total Protein: 7 g/dL (ref 6.0–8.5)

## 2017-11-05 DIAGNOSIS — R040 Epistaxis: Secondary | ICD-10-CM | POA: Diagnosis not present

## 2017-11-05 DIAGNOSIS — H6123 Impacted cerumen, bilateral: Secondary | ICD-10-CM | POA: Diagnosis not present

## 2017-11-18 DIAGNOSIS — M25511 Pain in right shoulder: Secondary | ICD-10-CM | POA: Diagnosis not present

## 2017-11-25 ENCOUNTER — Other Ambulatory Visit: Payer: Self-pay | Admitting: Neurology

## 2017-11-25 ENCOUNTER — Other Ambulatory Visit (HOSPITAL_COMMUNITY): Payer: Self-pay | Admitting: Internal Medicine

## 2017-11-25 DIAGNOSIS — Z1231 Encounter for screening mammogram for malignant neoplasm of breast: Secondary | ICD-10-CM

## 2017-11-26 ENCOUNTER — Other Ambulatory Visit: Payer: Self-pay

## 2017-11-26 MED ORDER — CLOPIDOGREL BISULFATE 75 MG PO TABS: 75.0000 mg | ORAL_TABLET | Freq: Every day | ORAL | 0 refills | Status: DC

## 2017-12-09 ENCOUNTER — Ambulatory Visit (HOSPITAL_COMMUNITY)
Admission: RE | Admit: 2017-12-09 | Discharge: 2017-12-09 | Disposition: A | Discharge status: Home / Self Care | Payer: Medicare Other | Source: Ambulatory Visit | Attending: Internal Medicine | Admitting: Internal Medicine

## 2017-12-09 DIAGNOSIS — Z1231 Encounter for screening mammogram for malignant neoplasm of breast: Secondary | ICD-10-CM | POA: Insufficient documentation

## 2017-12-18 ENCOUNTER — Other Ambulatory Visit: Payer: Self-pay | Admitting: Neurology

## 2017-12-21 ENCOUNTER — Other Ambulatory Visit: Payer: Self-pay

## 2017-12-21 ENCOUNTER — Emergency Department (HOSPITAL_BASED_OUTPATIENT_CLINIC_OR_DEPARTMENT_OTHER): Payer: Medicare Other

## 2017-12-21 ENCOUNTER — Emergency Department (HOSPITAL_BASED_OUTPATIENT_CLINIC_OR_DEPARTMENT_OTHER)
Admission: EM | Admit: 2017-12-21 | Discharge: 2017-12-22 | Disposition: A | Discharge status: Home / Self Care | Payer: Medicare Other | Attending: Emergency Medicine | Admitting: Emergency Medicine

## 2017-12-21 ENCOUNTER — Encounter (HOSPITAL_BASED_OUTPATIENT_CLINIC_OR_DEPARTMENT_OTHER): Payer: Self-pay

## 2017-12-21 DIAGNOSIS — Z7902 Long term (current) use of antithrombotics/antiplatelets: Secondary | ICD-10-CM | POA: Diagnosis not present

## 2017-12-21 DIAGNOSIS — E785 Hyperlipidemia, unspecified: Secondary | ICD-10-CM | POA: Insufficient documentation

## 2017-12-21 DIAGNOSIS — R509 Fever, unspecified: Secondary | ICD-10-CM | POA: Insufficient documentation

## 2017-12-21 DIAGNOSIS — Z79899 Other long term (current) drug therapy: Secondary | ICD-10-CM | POA: Diagnosis not present

## 2017-12-21 DIAGNOSIS — Z794 Long term (current) use of insulin: Secondary | ICD-10-CM | POA: Insufficient documentation

## 2017-12-21 DIAGNOSIS — N3000 Acute cystitis without hematuria: Secondary | ICD-10-CM | POA: Diagnosis not present

## 2017-12-21 DIAGNOSIS — R6889 Other general symptoms and signs: Secondary | ICD-10-CM

## 2017-12-21 DIAGNOSIS — R05 Cough: Secondary | ICD-10-CM | POA: Insufficient documentation

## 2017-12-21 DIAGNOSIS — Z87891 Personal history of nicotine dependence: Secondary | ICD-10-CM | POA: Insufficient documentation

## 2017-12-21 DIAGNOSIS — Z8673 Personal history of transient ischemic attack (TIA), and cerebral infarction without residual deficits: Secondary | ICD-10-CM | POA: Insufficient documentation

## 2017-12-21 DIAGNOSIS — R350 Frequency of micturition: Secondary | ICD-10-CM | POA: Diagnosis present

## 2017-12-21 DIAGNOSIS — E119 Type 2 diabetes mellitus without complications: Secondary | ICD-10-CM | POA: Diagnosis not present

## 2017-12-21 DIAGNOSIS — I10 Essential (primary) hypertension: Secondary | ICD-10-CM | POA: Diagnosis not present

## 2017-12-21 LAB — URINALYSIS, ROUTINE W REFLEX MICROSCOPIC
Glucose, UA: NEGATIVE mg/dL
KETONES UR: 15 mg/dL — AB
NITRITE: NEGATIVE
PH: 5.5 (ref 5.0–8.0)
Protein, ur: NEGATIVE mg/dL
SPECIFIC GRAVITY, URINE: 1.025 (ref 1.005–1.030)

## 2017-12-21 LAB — URINALYSIS, MICROSCOPIC (REFLEX)

## 2017-12-21 NOTE — ED Triage Notes (Signed)
Per daughter pt with cough, fever x 2 days-PCP called in rx doxycycline and tessalon-this pm pt with increase agitation/confusion-pt with hx of dementia however confusion is worse and pt with urinary freq-pt NAD-steady gait-pt is A/O to name, place-at baseline per daughter

## 2017-12-21 NOTE — ED Notes (Signed)
Pt to BR with daughter assist-missed the hat and peed in toilet-pt to ED WR-NAD-steady gait

## 2017-12-22 DIAGNOSIS — R509 Fever, unspecified: Secondary | ICD-10-CM | POA: Diagnosis not present

## 2017-12-22 DIAGNOSIS — N3000 Acute cystitis without hematuria: Secondary | ICD-10-CM | POA: Diagnosis not present

## 2017-12-22 DIAGNOSIS — R05 Cough: Secondary | ICD-10-CM | POA: Diagnosis not present

## 2017-12-22 LAB — INFLUENZA PANEL BY PCR (TYPE A & B)
Influenza A By PCR: POSITIVE — AB
Influenza B By PCR: NEGATIVE

## 2017-12-22 MED ORDER — ALBUTEROL SULFATE HFA 108 (90 BASE) MCG/ACT IN AERS
2.0000 | INHALATION_SPRAY | Freq: Once | RESPIRATORY_TRACT | Status: AC
  Administered 2017-12-22: 2 via RESPIRATORY_TRACT
  Filled 2017-12-22: qty 6.7

## 2017-12-22 MED ORDER — OSELTAMIVIR PHOSPHATE 75 MG PO CAPS: 75.0000 mg | ORAL_CAPSULE | Freq: Two times a day (BID) | ORAL | 0 refills | Status: DC

## 2017-12-22 MED ORDER — LEVOFLOXACIN 500 MG PO TABS
500.0000 mg | ORAL_TABLET | Freq: Once | ORAL | Status: AC
  Administered 2017-12-22: 500 mg via ORAL
  Filled 2017-12-22: qty 1

## 2017-12-22 MED ORDER — LEVOFLOXACIN 500 MG PO TABS: 500.0000 mg | ORAL_TABLET | Freq: Every day | ORAL | 0 refills | Status: DC

## 2017-12-22 NOTE — ED Provider Notes (Signed)
[facility] DEPARTMENT Provider Note   CSN: [id number] Arrival date & time: 12/21/17  1950     History   Chief Complaint Chief Complaint  Patient presents with  . Urinary Frequency    HPI Theresa Kramer is a 77 y.o. female.  HPI  This is a 77 year old female with a history of diabetes, hypertension, hyperlipidemia, early dementia who presents with her daughter with concerns for fever and altered mental status.  Daughter reports 1 day history of cough.  Noted to have fevers at home to 101.  Daughter called primary doctor who prescribed doxycycline.  Cough has been nonproductive.  Remote history of smoking.  Daughter states today that her mother developed worsening confusion and seemed to go to urinate "every 2 minutes."  She has history of urinary tract infections and daughter was concerned that this may be the culprit.  She has had a sick contact with similar upper respiratory symptoms.  Daughter reports that she is currently at her baseline.  When asked, the patient has no acute complaint.  Past Medical History:  Diagnosis Date  . Arthritis   . B12 deficiency   . Bronchitis    hx of  . Cancer ([place])   . Complication of anesthesia   . Depression    "not taking medication"  . Diabetes mellitus   . GERD (gastroesophageal reflux disease)   . H/O hiatal hernia   . Hyperlipidemia   . Hypertension   . Noncompliance with medication regimen   . Pinched cervical nerve root   . PONV (postoperative nausea and vomiting)   . Stroke ([place])   . Urinary tract infection    hx of    Patient Active Problem List   Diagnosis Date Noted  . History of stroke 08/04/2017  . IDA (iron deficiency anemia) 03/25/2017  . Somnolence, daytime 12/31/2016  . Snoring 12/31/2016  . Occipital neuralgia   . Acute ischemic stroke ([place])   . Stroke ([place])   . 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 Leukocytosis  08/01/2016  . Urinary tract infectious disease 08/01/2016  . Type 2 diabetes mellitus without complication, with long-term current use of insulin ([place]) 07/30/2016  . Essential hypertension 07/30/2016  . Osteoarthritis 07/30/2016  . Normocytic anemia 07/30/2016  . HLD (hyperlipidemia)   . Schatzki's ring   . Cervical herniated disc 03/15/2012  . GERD 04/29/2010  . CHEST PAIN, ATYPICAL 04/29/2010  . DYSPHAGIA UNSPECIFIED 04/29/2010    Past Surgical History:  Procedure Laterality Date  . ABDOMINAL HYSTERECTOMY    . ANTERIOR CERVICAL DECOMP/DISCECTOMY FUSION  03/15/2012   Procedure: ANTERIOR CERVICAL DECOMPRESSION/DISCECTOMY FUSION 2 LEVELS;  Surgeon: Ophelia Charter, MD;  Location: [facility];  Service: Neurosurgery;  Laterality: N/A;  Cervical four-five Cervical five six anterior cervical decompression with fusion interbody prothesis plating and bonegraft  . APPENDECTOMY    . BIOPSY  05/18/2017   Procedure: BIOPSY;  Surgeon: Daneil Dolin, MD;  Location: [facility];  Service: Endoscopy;;  gastric bx's  . BONE CYST EXCISION     from lower back and foot  . CHOLECYSTECTOMY    . COLONOSCOPY  2011   Dr. Gala Romney: prep compromised exam. diverticulosis, hemorrhoids. next tcs 2021  . COLONOSCOPY WITH PROPOFOL N/A 05/18/2017   Procedure: COLONOSCOPY WITH PROPOFOL;  Surgeon: Daneil Dolin, MD;  Location: [facility];  Service: Endoscopy;  Laterality: N/A;  1100  . DILATION AND CURETTAGE  OF UTERUS    . 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 Multiple distal rings dilated and disrupted as described above. Small hiatal hernia.   Marland Kitchen ESOPHAGOGASTRODUODENOSCOPY (EGD) WITH PROPOFOL N/A 05/18/2017   Procedure: ESOPHAGOGASTRODUODENOSCOPY (EGD) WITH PROPOFOL;  Surgeon: Daneil Dolin, MD;  [facility];  Service: Endoscopy;  Laterality: N/A;  . EXCISION METACARPAL MASS Left 03/03/2016   Procedure: EXCISION OF LEFT   DORSAL MASS OF EXTENSOR TENDON;  Surgeon: Milly Jakob, MD;  Location: [facility];  Service: Orthopedics;  Laterality: Left;  . EYE SURGERY     bilateral cataract surgery,   . HIP ARTHROPLASTY     right  . JOINT REPLACEMENT    . KNEE SURGERY     torn cartilage repair  . MALONEY DILATION N/A 09/19/2014   Procedure: Venia Minks DILATION;  Surgeon: Daneil Dolin, MD;  [facility];  Service: Endoscopy;  Laterality: N/A;  . ORTHOPEDIC SURGERY    . POLYPECTOMY  05/18/2017   Procedure: POLYPECTOMY;  Surgeon: Daneil Dolin, MD;  [facility];  Service: Endoscopy;;  polyp at hepatic flexure, descending colon  . ROTATOR CUFF REPAIR     "multiple in both shoulders" and total shoulder replacement in left arm  . SAVORY DILATION N/A 09/19/2014   Procedure: SAVORY DILATION;  Surgeon: Daneil Dolin, MD;  [facility];  Service: Endoscopy;  Laterality: N/A;  . TONSILLECTOMY      OB History    No data available       Home Medications    Prior to Admission medications   Medication Sig Start Date End Date Taking? Authorizing Provider  acetaminophen (TYLENOL ARTHRITIS PAIN) 650 MG CR tablet Take 1,300 mg by mouth 2 (two) times daily.     [provider]  amLODipine (NORVASC) 5 MG tablet Take 5 mg by mouth at bedtime.     [provider]  citalopram (CELEXA) 20 MG tablet Take 20 mg by mouth daily.      [provider]  clopidogrel (PLAVIX) 75 MG tablet TAKE 1 TABLET BY MOUTH  DAILY 12/18/17   Rosalin Hawking, MD  clotrimazole (LOTRIMIN) 1 % cream Apply 1 application topically 2 (two) times daily as needed. For rash     [provider]  cyanocobalamin (,VITAMIN B-12,) 1000 MCG/ML injection Inject 1,000 mcg into the muscle every 30 (thirty) days. Usually on the 1st     [provider]  glipiZIDE (GLUCOTROL XL) 5 MG 24 hr tablet Take 5 mg by mouth daily.      [provider]  insulin glargine (LANTUS) 100  UNIT/ML injection Inject 55 Units into the skin at bedtime.     [provider]  insulin lispro (HUMALOG) 100 UNIT/ML injection Inject 8 Units into the skin 3 (three) times daily before meals.     [provider]  levofloxacin (LEVAQUIN) 500 MG tablet Take 1 tablet (500 mg total) by mouth daily. 12/22/17   Horton, Barbette Hair, MD  LORazepam (ATIVAN) 0.5 MG tablet Take 0.5 mg by mouth 2 (two) times daily. Breakfast and after lunch    [provider]  losartan-hydrochlorothiazide (HYZAAR) 100-25 MG tablet Take 1 tablet by mouth daily.    [provider]  metFORMIN (GLUCOPHAGE) 1000 MG tablet Take 500 mg by mouth 2 (two) times daily with a meal. After lunch and bedtime    [provider]  oseltamivir (TAMIFLU) 75 MG  capsule Take 1 capsule (75 mg total) by mouth every 12 (twelve) hours. 12/22/17   Horton, Barbette Hair, MD  oxybutynin (DITROPAN-XL) 10 MG 24 hr tablet Take 10 mg by mouth at bedtime.     [provider]  pantoprazole (PROTONIX) 40 MG tablet Take 40 mg by mouth daily.    [provider]  pramipexole (MIRAPEX) 0.25 MG tablet Take 0.25 mg by mouth at bedtime.     [provider]  pravastatin (PRAVACHOL) 80 MG tablet Take 1 tablet (80 mg total) by mouth daily. Patient taking differently: Take 80 mg by mouth at bedtime.  08/01/16   Kelvin Cellar, MD  ranitidine (ZANTAC) 150 MG tablet Take 150 mg by mouth at bedtime.    [provider]  risperiDONE (RISPERDAL) 0.5 MG tablet Take 0.5 mg by mouth 2 (two) times daily. Breakfast and lunch    [provider]  traMADol (ULTRAM) 50 MG tablet Take 1 tablet (50 mg total) by mouth every 6 (six) hours as needed for moderate pain. 08/04/16   Raiford Noble Latif, DO  traZODone (DESYREL) 50 MG tablet Take 50 mg by mouth at bedtime.    [provider]    Family History Family History  Problem Relation Age of Onset  . Cirrhosis Brother        etoh  . Colon  cancer Neg Hx     Social History Social History   Tobacco Use  . Smoking status: Former Smoker    Last attempt to quit: 05/13/1974    Years since quitting: 43.6  . Smokeless tobacco: Never Used  . Tobacco comment: stopped 25 years ago  Substance Use Topics  . Alcohol use: No  . Drug use: No     Allergies   Meloxicam   Review of Systems Review of Systems  Constitutional: Positive for fever.  Respiratory: Positive for cough.   Cardiovascular: Negative for chest pain.  Gastrointestinal: Negative for abdominal pain, nausea and vomiting.  Genitourinary: Positive for frequency.  Musculoskeletal: Negative for back pain.  Skin: Negative for rash.  Psychiatric/Behavioral: Positive for confusion.  All other systems reviewed and are negative.    Physical Exam Updated Vital Signs BP 109/77 (BP Location: Right Arm)   Pulse 91   Temp 100.1 F (37.8 C) (Oral)   Resp 18   Ht 5\' 4"  (1.626 m)   Wt 95.3 kg (210 lb)   SpO2 97%   BMI 36.05 kg/m   Physical Exam  Constitutional: No distress.  Elderly, nontoxic-appearing, no acute distress, overweight  HENT:  Head: Normocephalic and atraumatic.  Cardiovascular: Normal rate, regular rhythm and normal heart sounds.  Pulmonary/Chest: Effort normal and breath sounds normal. No respiratory distress. She has no wheezes.  Abdominal: Soft. Bowel sounds are normal. There is no tenderness. There is no guarding.  Genitourinary:  Genitourinary Comments: No CVA tenderness  Neurological: She is alert.  Oriented to self and place, not time, 5 out of 5 strength in all 4 extremities, cranial nerves II through XII intact  Skin: Skin is warm and dry.  Psychiatric: She has a normal mood and affect.  Nursing note and vitals reviewed.    ED Treatments / Results  Labs (all labs ordered are listed, but only abnormal results are displayed) Labs Reviewed  URINALYSIS, ROUTINE W REFLEX MICROSCOPIC - Abnormal; Notable for the following components:        Result Value   APPearance CLOUDY (*)    Hgb urine dipstick SMALL (*)  Bilirubin Urine SMALL (*)    Ketones, ur 15 (*)    Leukocytes, UA MODERATE (*)    All other components within normal limits  URINALYSIS, MICROSCOPIC (REFLEX) - Abnormal; Notable for the following components:   Bacteria, UA FEW (*)    Squamous Epithelial / LPF 0-5 (*)    All other components within normal limits  URINE CULTURE  INFLUENZA PANEL BY PCR (TYPE A & B)    EKG  EKG Interpretation None       Radiology Dg Chest 2 View  Result Date: 12/21/2017 CLINICAL DATA:  Cough beginning yesterday.  Fever.  Confusion. EXAM: CHEST  2 VIEW COMPARISON:  08/02/2016 FINDINGS: Heart size is normal. Mediastinal shadows are normal. There may be central bronchial thickening but there is no infiltrate, collapse or effusion. No significant bone finding. Previous reverse shoulder replacement on the left. IMPRESSION: Possible bronchitis.  No consolidation or collapse. Electronically Signed   By: Nelson Chimes M.D.   On: 12/21/2017 20:52    Procedures Procedures (including critical care time)  Medications Ordered in ED Medications  levofloxacin (LEVAQUIN) tablet 500 mg (500 mg Oral Given 12/22/17 0045)  albuterol (PROVENTIL HFA;VENTOLIN HFA) 108 (90 Base) MCG/ACT inhaler 2 puff (2 puffs Inhalation Given 12/22/17 0051)     Initial Impression / Assessment and Plan / ED Course  I have reviewed the triage vital signs and the nursing notes.  Pertinent labs & imaging results that were available during my care of the patient were reviewed by me and considered in my medical decision making (see chart for details).     Presents with increasing confusion, fever, upper respiratory symptoms with cough as well as frequency.  Daughter reports that she is currently at her baseline and improved with temperature management.  Temperature noted to be 100.3 upon arrival.  Patient is overall nontoxic appearing.  No CVA tenderness.   Pulmonary exam is clear.  Chest x-ray shows no evidence of pneumonia.  She has had a positive sick contact and given the prevalence of flu, early flu is a consideration.  She has already been on doxycycline to cover for pneumonia.  Given history of smoking, she was given an inhaler for her cough.  Urinalysis does show too numerous to count white cells and bacteria.  Urine culture sent.  Difficult to assess whether or not her fever is coming from upper respiratory infection versus UTI.  Discussed with the daughter further workup including basic lab work.  However, daughter feels that her mother is at her baseline with temperature control and wishes to limit further examination.  Given that she is otherwise well-appearing and vital signs do not suggest sepsis, feel this is reasonable.  Influenza screen was sent.  I have offered Tamiflu.  Given possible early pneumonia not showing up on chest x-ray as well as UTI, will switch antibiotic to Levaquin to cover for both urinary and pulmonary source.  The patient was given 1 dose of antibiotic in the ED.  Instructed to call back for influenza results later today.  Daughter stated understanding.  After history, exam, and medical workup I feel the patient has been appropriately medically screened and is safe for discharge home. Pertinent diagnoses were discussed with the patient. Patient was given return precautions.   Final Clinical Impressions(s) / ED Diagnoses   Final diagnoses:  Flu-like symptoms  Acute cystitis without hematuria    ED Discharge Orders        Ordered    levofloxacin (LEVAQUIN) 500 MG  tablet  Daily     12/22/17 0142    oseltamivir (TAMIFLU) 75 MG capsule  Every 12 hours     12/22/17 0142       Horton, Barbette Hair, MD 12/22/17 0345

## 2017-12-22 NOTE — Discharge Instructions (Signed)
You were seen today for fever and confusion.  Also noted to have cough and urinary symptoms.  You do have evidence of urinary tract infection.  You also may have early influenza.  Chest x-ray does not show any evidence of pneumonia.  However, your antibiotics will be changed to cover both urine and pneumonia.  If worsening fevers, worsening cough or confusion, you need to be reevaluated immediately.  Make sure to stay hydrated.

## 2017-12-22 NOTE — ED Notes (Signed)
Pt and caregiver verbalize understanding of dc instructions and deny any further needs at this time

## 2017-12-24 LAB — URINE CULTURE: Culture: 80000 — AB

## 2017-12-25 ENCOUNTER — Telehealth: Payer: Self-pay

## 2017-12-25 NOTE — Progress Notes (Signed)
ED Antimicrobial Stewardship Positive Culture Follow Up   Theresa Kramer is an 77 y.o. female who presented to [facility] on 12/21/2017 with a chief complaint of  Chief Complaint  Patient presents with  . Urinary Frequency    Recent Results (from the past 720 hour(s))  Urine culture     Status: Abnormal   Collection Time: 12/21/17  9:30 PM  Result Value Ref Range Status   Specimen Description   Final    URINE, RANDOM Performed at [facility], [address] [place], [place] [address]    Special Requests   Final    NONE Performed at [facility], [address] [place], [place] [address]    Culture (A)  Final    80,000 COLONIES/mL KLEBSIELLA PNEUMONIAE Confirmed Extended Spectrum Beta-Lactamase Producer (ESBL).  In bloodstream infections from ESBL organisms, carbapenems are preferred over piperacillin/tazobactam. They are shown to have a lower risk of mortality. Performed at [facility], [place] [address], [place], [place] [address]    Report Status 12/24/2017 FINAL  Final   Organism ID, Bacteria KLEBSIELLA PNEUMONIAE (A)  Final      Susceptibility   Klebsiella pneumoniae - MIC*    AMPICILLIN >=32 RESISTANT Resistant     CEFAZOLIN RESISTANT Resistant     CEFTRIAXONE RESISTANT Resistant     CIPROFLOXACIN <=0.25 SENSITIVE Sensitive     GENTAMICIN <=1 SENSITIVE Sensitive     IMIPENEM 0.5 SENSITIVE Sensitive     NITROFURANTOIN 64 INTERMEDIATE Intermediate     TRIMETH/SULFA <=20 SENSITIVE Sensitive     AMPICILLIN/SULBACTAM 16 INTERMEDIATE Intermediate     PIP/TAZO <=4 SENSITIVE Sensitive     Extended ESBL POSITIVE Resistant     * 80,000 COLONIES/mL KLEBSIELLA PNEUMONIAE    [x]  Treated with Levaquin, organism resistant to prescribed antimicrobial  Needs additional follow-up - call for symptom check specifically for UTI symptoms - If resolved/improved - no additional treatment warranted - If still symptomatic - Fosfomycin 3g po x 1  ED Provider:  Ardyth Harps, PA-C  Lawson Radar 12/25/2017, 7:59 AM Infectious Diseases Pharmacist Phone# [phone]

## 2017-12-25 NOTE — Telephone Encounter (Signed)
Spoke with daughter regarding Theresa Kramer.  Denied any further urinary symptoms. May need Fosfomycin if problems arise and continue per  Geanie Kenning PA-C

## 2017-12-28 DIAGNOSIS — E1129 Type 2 diabetes mellitus with other diabetic kidney complication: Secondary | ICD-10-CM | POA: Diagnosis not present

## 2017-12-30 DIAGNOSIS — I10 Essential (primary) hypertension: Secondary | ICD-10-CM | POA: Diagnosis not present

## 2017-12-30 DIAGNOSIS — E1129 Type 2 diabetes mellitus with other diabetic kidney complication: Secondary | ICD-10-CM | POA: Diagnosis not present

## 2018-02-08 NOTE — Progress Notes (Signed)
Marland Kitchen    HEMATOLOGY/ONCOLOGY CLINIC NOTE  Date of Service:  02/11/18  Patient Care Team: Asencion Noble, MD as PCP - General Rourk, [name], MD as Consulting Physician (Gastroenterology)  CHIEF COMPLAINTS/PURPOSE OF CONSULTATION:  F/u for B cell lymphoproliferative disorder.  HISTORY OF PRESENTING ILLNESS:  Theresa Kramer is a wonderful 77 y.o. female who has been referred to [place] by Dr Asencion Noble, MD for evaluation and management of lymphocytosis.  Patient has a h/o B12 def, depression, arthritis and DDD and was recently admitted to the hospital with TIA vs delirium in the setting of UTI. 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 She is here for further evaluation of her newly diagnosed B-cell leukemia/lymphoma. Patient reports no fevers. Has some night sweats but those have been in the setting of hypoglycemia. No overt LNadenopathy. No abdominal pain or distension. No reported weight loss. Has chronic fatigue that hasnt significantly changed recently.   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 The patient's last visit with [place] was on 10/14/17. She is accompanied today by her daughter. The pt reports that she is doing well overall.   The patient's daughter reports that the pt had the flu and a UTI in February. She has experienced less appetite and some loss of weight. She notes that her anxiety is under control   Lab results today (02/11/18) of CBC, CMP, and Reticulocytes is as follows: all values are WNL except for WBC at 31.7k, Hgb at 9.9, HCT at 31.2, RDW at 16.3, Lymphs Abs at 27.4, Glucose at 202, Albumin at 3.1, Alk Phos at 157. LDH 02/11/18 is WNL at 242.  Ferritin 02/11/18 is 194 -- suggesting against overt iron deficiency .  On review of systems, pt reports moving her bowels well, decreased appetite, some weight loss,  sleepiness, and denies blood in the stools, noticing any enlarged lymph nodes, abdominal pain, and any other symptoms.   MEDICAL HISTORY:  Past Medical History:  Diagnosis Date  . Arthritis   . B12 deficiency   . Bronchitis    hx of  . Cancer ([place])   . Complication of anesthesia   . Depression    "not taking medication"  . Diabetes mellitus   . GERD (gastroesophageal reflux disease)   . H/O hiatal hernia   . Hyperlipidemia   . Hypertension   . Noncompliance with medication regimen   . Pinched cervical nerve root   . PONV (postoperative nausea and vomiting)   . Stroke ([place])   . Urinary tract infection    hx of    SURGICAL HISTORY: Past Surgical History:  Procedure Laterality Date  . ABDOMINAL HYSTERECTOMY    . ANTERIOR CERVICAL DECOMP/DISCECTOMY FUSION  03/15/2012   Procedure: ANTERIOR CERVICAL DECOMPRESSION/DISCECTOMY FUSION 2 LEVELS;  Surgeon: Ophelia Charter, MD;  Location: [facility];  Service: Neurosurgery;  Laterality: N/A;  Cervical four-five Cervical five six anterior cervical decompression with fusion interbody prothesis plating and bonegraft  . APPENDECTOMY    . BIOPSY  05/18/2017   Procedure: BIOPSY;  Surgeon: Daneil Dolin, MD;  [facility];  Service: Endoscopy;;  gastric bx's  . BONE CYST EXCISION     from lower back and foot  . CHOLECYSTECTOMY    . COLONOSCOPY  2011   Dr. Gala Romney: prep compromised exam. diverticulosis, hemorrhoids. next tcs 2021  .  COLONOSCOPY WITH PROPOFOL N/A 05/18/2017   Procedure: COLONOSCOPY WITH PROPOFOL;  Surgeon: Daneil Dolin, MD;  [facility];  Service: Endoscopy;  Laterality: N/A;  1100  . DILATION AND CURETTAGE OF UTERUS    . 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 Multiple distal rings dilated and disrupted as described above. Small hiatal hernia.   Marland Kitchen ESOPHAGOGASTRODUODENOSCOPY (EGD) WITH PROPOFOL N/A 05/18/2017   Procedure:  ESOPHAGOGASTRODUODENOSCOPY (EGD) WITH PROPOFOL;  Surgeon: Daneil Dolin, MD;  [facility];  Service: Endoscopy;  Laterality: N/A;  . EXCISION METACARPAL MASS Left 03/03/2016   Procedure: EXCISION OF LEFT  DORSAL MASS OF EXTENSOR TENDON;  Surgeon: Milly Jakob, MD;  Location: [facility] [place];  Service: Orthopedics;  Laterality: Left;  . EYE SURGERY     bilateral cataract surgery,   . HIP ARTHROPLASTY     right  . JOINT REPLACEMENT    . KNEE SURGERY     torn cartilage repair  . MALONEY DILATION N/A 09/19/2014   Procedure: Venia Minks DILATION;  Surgeon: Daneil Dolin, MD;  [facility];  Service: Endoscopy;  Laterality: N/A;  . ORTHOPEDIC SURGERY    . POLYPECTOMY  05/18/2017   Procedure: POLYPECTOMY;  Surgeon: Daneil Dolin, MD;  [facility];  Service: Endoscopy;;  polyp at hepatic flexure, descending colon  . ROTATOR CUFF REPAIR     "multiple in both shoulders" and total shoulder replacement in left arm  . SAVORY DILATION N/A 09/19/2014   Procedure: SAVORY DILATION;  Surgeon: Daneil Dolin, MD;  [facility];  Service: Endoscopy;  Laterality: N/A;  . TONSILLECTOMY      SOCIAL HISTORY: Social History   Socioeconomic History  . Marital status: Widowed    Spouse name: Not on file  . Number of children: Not on file  . Years of education: Not on file  . Highest education level: Not on file  Occupational History  . Not on file  Social Needs  . Financial resource strain: Not on file  . Food insecurity:    Worry: Not on file    Inability: Not on file  . Transportation needs:    Medical: Not on file    Non-medical: Not on file  Tobacco Use  . Smoking status: Former Smoker    Last attempt to quit: 05/13/1974    Years since quitting: 43.7  . Smokeless tobacco: Never Used  . Tobacco comment: stopped 25 years ago  Substance and Sexual Activity  . Alcohol use: No  . Drug use: No  . Sexual activity: Not on file  Lifestyle  .  Physical activity:    Days per week: Not on file    Minutes per session: Not on file  . Stress: Not on file  Relationships  . Social connections:    Talks on phone: Not on file    Gets together: Not on file    Attends religious service: Not on file    Active member of club or organization: Not on file    Attends meetings of clubs or organizations: Not on file    Relationship status: Not on file  . Intimate partner violence:    Fear of current or ex partner: Not on file    Emotionally abused: Not on file    Physically abused: Not on file    Forced sexual activity: Not on file  Other Topics Concern  . Not on file  Social History Narrative  . Not on file    FAMILY HISTORY: Family History  Problem Relation Age of Onset  . Cirrhosis Brother        etoh  . Colon cancer Neg Hx     ALLERGIES:  is allergic to meloxicam.  MEDICATIONS:  Current Outpatient Medications  Medication Sig Dispense Refill  . acetaminophen (TYLENOL ARTHRITIS PAIN) 650 MG CR tablet Take 1,300 mg by mouth 2 (two) times daily.     Marland Kitchen amLODipine (NORVASC) 5 MG tablet Take 5 mg by mouth at bedtime.     . citalopram (CELEXA) 20 MG tablet Take 20 mg by mouth daily.      . clopidogrel (PLAVIX) 75 MG tablet TAKE 1 TABLET BY MOUTH  DAILY 30 tablet 3  . clotrimazole (LOTRIMIN) 1 % cream Apply 1 application topically 2 (two) times daily as needed. For rash     . cyanocobalamin (,VITAMIN B-12,) 1000 MCG/ML injection Inject 1,000 mcg into the muscle every 30 (thirty) days. Usually on the 1st     . glipiZIDE (GLUCOTROL XL) 5 MG 24 hr tablet Take 5 mg by mouth daily.      . insulin glargine (LANTUS) 100 UNIT/ML injection Inject 55 Units into the skin at bedtime.     . insulin lispro (HUMALOG) 100 UNIT/ML injection Inject 8 Units into the skin 3 (three) times daily before meals.     Marland Kitchen levofloxacin (LEVAQUIN) 500 MG tablet Take 1 tablet (500 mg total) by mouth daily. 7 tablet 0  . LORazepam (ATIVAN) 0.5 MG tablet Take 0.5 mg  by mouth 2 (two) times daily. Breakfast and after lunch    . losartan-hydrochlorothiazide (HYZAAR) 100-25 MG tablet Take 1 tablet by mouth daily.    . metFORMIN (GLUCOPHAGE) 1000 MG tablet Take 500 mg by mouth 2 (two) times daily with a meal. After lunch and bedtime    . oseltamivir (TAMIFLU) 75 MG capsule Take 1 capsule (75 mg total) by mouth every 12 (twelve) hours. 10 capsule 0  . oxybutynin (DITROPAN-XL) 10 MG 24 hr tablet Take 10 mg by mouth at bedtime.     . pantoprazole (PROTONIX) 40 MG tablet Take 40 mg by mouth daily.    . pramipexole (MIRAPEX) 0.25 MG tablet Take 0.25 mg by mouth at bedtime.     . pravastatin (PRAVACHOL) 80 MG tablet Take 1 tablet (80 mg total) by mouth daily. (Patient taking differently: Take 80 mg by mouth at bedtime. ) 30 tablet 3  . ranitidine (ZANTAC) 150 MG tablet Take 150 mg by mouth at bedtime.    . risperiDONE (RISPERDAL) 0.5 MG tablet Take 0.5 mg by mouth 2 (two) times daily. Breakfast and lunch    . traMADol (ULTRAM) 50 MG tablet Take 1 tablet (50 mg total) by mouth every 6 (six) hours as needed for moderate pain. 20 tablet 0  . traZODone (DESYREL) 50 MG tablet Take 50 mg by mouth at bedtime.     No current facility-administered medications for this visit.     REVIEW OF SYSTEMS:   .10 Point review of Systems was done is negative except as noted above.   PHYSICAL EXAMINATION: ECOG PERFORMANCE STATUS: 2 - Symptomatic, <50% confined to bed  . Vitals:   02/11/18 0913  BP: 129/63  Pulse: 77  Resp: 18  Temp: 98.6 F (37 C)  SpO2: 99%   Filed Weights   02/11/18 0913  Weight: 195 lb 8 oz (88.7 kg)   .Body mass index is 33.56  kg/m.  GENERAL:alert, in no acute distress and comfortable SKIN: no acute rashes, no significant lesions EYES: conjunctiva are pink and non-injected, sclera anicteric OROPHARYNX: MMM, no exudates, no oropharyngeal erythema or ulceration NECK: supple, no JVD LYMPH:  no palpable lymphadenopathy in the cervical, axillary or  inguinal regions LUNGS: clear to auscultation b/l with normal respiratory effort HEART: regular rate & rhythm ABDOMEN:  normoactive bowel sounds , non tender, not distended. Extremity: no pedal edema PSYCH: alert & oriented x 3 with fluent speech NEURO: no focal motor/sensory deficits   LABORATORY DATA:  I have reviewed the data as listed  . CBC Latest Ref Rng & Units 02/11/2018 10/14/2017 07/09/2017  WBC 3.9 - 10.3 K/uL 31.7(H) 20.3(H) 19.6(H)  Hemoglobin 11.6 - 15.9 g/dL 9.9(L) 11.2(L) 9.5(L)  Hematocrit 34.8 - 46.6 % 31.2(L) 35.0 30.1(L)  Platelets 145 - 400 K/uL 278 238 266   . CBC    Component Value Date/Time   WBC 31.7 (H) 02/11/2018 0814   RBC 3.71 02/11/2018 0814   RBC 3.72 02/11/2018 0814   HGB 9.9 (L) 02/11/2018 0814   HGB 11.2 (L) 10/14/2017 0824   HCT 31.2 (L) 02/11/2018 0814   HCT 35.0 10/14/2017 0824   PLT 278 02/11/2018 0814   PLT 238 10/14/2017 0824   MCV 84.0 02/11/2018 0814   MCV 84.5 10/14/2017 0824   MCH 26.8 02/11/2018 0814   MCHC 31.9 02/11/2018 0814   RDW 16.3 (H) 02/11/2018 0814   RDW 19.9 (H) 10/14/2017 0824   LYMPHSABS 27.4 (H) 02/11/2018 0814   LYMPHSABS 16.2 (H) 10/14/2017 0824   MONOABS 0.2 02/11/2018 0814   MONOABS 0.1 10/14/2017 0824   EOSABS 0.1 02/11/2018 0814   EOSABS 0.1 10/14/2017 0824   BASOSABS 0.0 02/11/2018 0814   BASOSABS 0.0 10/14/2017 0824     . CMP Latest Ref Rng & Units 02/11/2018 10/14/2017 10/14/2017  Glucose 70 - 140 mg/dL 202(H) 106 -  BUN 7 - 26 mg/dL 19 21.7 -  Creatinine 0.60 - 1.10 mg/dL 0.94 0.9 -  Sodium 136 - 145 mmol/L 140 139 -  Potassium 3.5 - 5.1 mmol/L 4.1 4.6 -  Chloride 98 - 109 mmol/L 105 - -  CO2 22 - 29 mmol/L 24 24 -  Calcium 8.4 - 10.4 mg/dL 9.3 9.3 -  Total Protein 6.4 - 8.3 g/dL 7.2 7.7 7.0  Total Bilirubin 0.2 - 1.2 mg/dL 0.4 0.45 -  Alkaline Phos 40 - 150 U/L 157(H) 96 -  AST 5 - 34 U/L 10 14 -  ALT 0 - 55 U/L 12 16 -    Lab Results  Component Value Date   FERRITIN 194 02/11/2018              . Lab Results  Component Value Date   LDH 242 02/11/2018         RADIOGRAPHIC STUDIES: I have personally reviewed the radiological images as listed and agreed with the findings in the report. No results found.  PET/CT 10/25/017:  IMPRESSION: 1. Mildly enlarged and mildly hypermetabolic spleen, consistent with the provided history of a lymphoproliferative disorder. 2. No hypermetabolic lymphadenopathy or other hypermetabolic sites of disease. 3. Additional findings include aortic atherosclerosis, three-vessel coronary atherosclerosis, small hiatal hernia and small fat containing left inguinal hernia.   Electronically Signed   By: Ilona Sorrel M.D.   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 Noted to have elevated wbc/lymphocyte counts end of September. Unknown if she had a more chronic elevation of her WBC counts that might put a timeline on her lymphocytosis. -Differential diagnosis- CD5 neg variant of CLL (about 17-20% patients with CLL could be CD5neg) vs B-cell leukemia/lymphoma -PET/CT with no significant LNadenopathy and PET/CT with minimal enlarged and minimally hypermetabolic. FISH shows Trisomy 63 which would be consistent with CLL Features consistent with indolent chronic lymphoproliferative process - likely CLL  PLAN -In light recent infection, and worsening lab results including worsened anemia and increasing WBC, we will see pt back in 4 weeks to re-check labs -LDH WNL  -Discussed pt labwork today; WBC have increased to 31.7k and Hgb has dropped from 11.2 to 9.9.  -Discussed with pt and her daughter that in 4 weeks, if labs do not bounce back, a CT and BM Bx would be recommended with concerns of worsening CLL.   2) Anemia - microcytic likely related to iron deficiency previously . Now  and  less likely to be related to CLL.  Resolving after IViron Plan -no indication for IV iron at this time -continue Polysaccharide iron 165m po BID. She also has been taking Protonix -Hgb is decreased today to 9.9 . 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 Brings up the possibility of lymphoplasmacytic lymphoma however trisomy 12 is typical for CLL and uncommon in LPL/WM M spike 0.4g/dl unchanged  4). Patient Active Problem List   Diagnosis Date Noted  . History of stroke 08/04/2017  . IDA (iron deficiency anemia) 03/25/2017  . Somnolence, daytime 12/31/2016  . Snoring 12/31/2016  . Occipital neuralgia   . Acute ischemic stroke ([place]   . Stroke ([place]   . 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 Leukocytosis 08/01/2016  . Urinary tract infectious disease 08/01/2016  . Type 2 diabetes mellitus without complication, with long-term current use of insulin ([place] 07/30/2016  . Essential hypertension 07/30/2016  . Osteoarthritis 07/30/2016  . Normocytic anemia 07/30/2016  . HLD (hyperlipidemia)   . Schatzki's ring   . Cervical herniated disc 03/15/2012  . GERD 04/29/2010  . CHEST PAIN, ATYPICAL 04/29/2010  . DYSPHAGIA UNSPECIFIED 04/29/2010  -continue rf/u with PCP.   RTC with Dr KIrene Limboin 4 weeks with labs   All of the patients questions were answered with apparent satisfaction. The patient knows to call the clinic with any problems, questions or concerns.   . The total time spent in the appointment was 20 minutes and more than 50% was on counseling and direct patient cares.     GSullivan LoneMD MVan VoorhisAAHIVMS SSouth [facility] LLCCSpecialty Hospital At MonmouthHematology/Oncology Physician [place]Mar[place] (Office):       [phone](Work cell):  [phone](Fax):            [phone] This document serves as a record of services personally performed by GSullivan Lone MD. It was created on his behalf by SBaldwin Jamaica a trained medical scribe. The creation of this record is based on the scribe's personal observations and the provider's statements to them.   .I have reviewed the above documentation for accuracy and completeness, and I agree with the above. .Brunetta GeneraMD MS

## 2018-02-11 ENCOUNTER — Inpatient Hospital Stay: Payer: Medicare Other | Attending: Hematology | Admitting: Hematology

## 2018-02-11 ENCOUNTER — Telehealth: Payer: Self-pay

## 2018-02-11 ENCOUNTER — Inpatient Hospital Stay: Payer: Medicare Other

## 2018-02-11 VITALS — BP 129/63 | HR 77 | Temp 98.6°F | Resp 18 | Ht 64.0 in | Wt 195.5 lb

## 2018-02-11 DIAGNOSIS — D649 Anemia, unspecified: Secondary | ICD-10-CM

## 2018-02-11 DIAGNOSIS — R634 Abnormal weight loss: Secondary | ICD-10-CM | POA: Insufficient documentation

## 2018-02-11 DIAGNOSIS — D472 Monoclonal gammopathy: Secondary | ICD-10-CM | POA: Insufficient documentation

## 2018-02-11 DIAGNOSIS — D509 Iron deficiency anemia, unspecified: Secondary | ICD-10-CM | POA: Insufficient documentation

## 2018-02-11 DIAGNOSIS — E11649 Type 2 diabetes mellitus with hypoglycemia without coma: Secondary | ICD-10-CM | POA: Insufficient documentation

## 2018-02-11 DIAGNOSIS — E538 Deficiency of other specified B group vitamins: Secondary | ICD-10-CM | POA: Insufficient documentation

## 2018-02-11 DIAGNOSIS — C9110 Chronic lymphocytic leukemia of B-cell type not having achieved remission: Secondary | ICD-10-CM

## 2018-02-11 DIAGNOSIS — C9100 Acute lymphoblastic leukemia not having achieved remission: Secondary | ICD-10-CM | POA: Insufficient documentation

## 2018-02-11 LAB — LACTATE DEHYDROGENASE: LDH: 242 U/L (ref 125–245)

## 2018-02-11 LAB — COMPREHENSIVE METABOLIC PANEL
ALBUMIN: 3.1 g/dL — AB (ref 3.5–5.0)
ALK PHOS: 157 U/L — AB (ref 40–150)
ALT: 12 U/L (ref 0–55)
ANION GAP: 11 (ref 3–11)
AST: 10 U/L (ref 5–34)
BILIRUBIN TOTAL: 0.4 mg/dL (ref 0.2–1.2)
BUN: 19 mg/dL (ref 7–26)
CALCIUM: 9.3 mg/dL (ref 8.4–10.4)
CO2: 24 mmol/L (ref 22–29)
Chloride: 105 mmol/L (ref 98–109)
Creatinine, Ser: 0.94 mg/dL (ref 0.60–1.10)
GFR calc Af Amer: >60 mL/min (ref >60)
GFR calc non Af Amer: 57 mL/min — ABNORMAL LOW (ref >60)
GLUCOSE: 202 mg/dL — AB (ref 70–140)
POTASSIUM: 4.1 mmol/L (ref 3.5–5.1)
SODIUM: 140 mmol/L (ref 136–145)
TOTAL PROTEIN: 7.2 g/dL (ref 6.4–8.3)

## 2018-02-11 LAB — CBC WITH DIFFERENTIAL/PLATELET
Basophils Absolute: 0 10*3/uL (ref 0.0–0.1)
Basophils Relative: 0 %
Eosinophils Absolute: 0.1 10*3/uL (ref 0.0–0.5)
Eosinophils Relative: 0 %
HEMATOCRIT: 31.2 % — AB (ref 34.8–46.6)
HEMOGLOBIN: 9.9 g/dL — AB (ref 11.6–15.9)
LYMPHS ABS: 27.4 10*3/uL — AB (ref 0.9–3.3)
Lymphocytes Relative: 87 %
MCH: 26.8 pg (ref 25.1–34.0)
MCHC: 31.9 g/dL (ref 31.5–36.0)
MCV: 84 fL (ref 79.5–101.0)
MONO ABS: 0.2 10*3/uL (ref 0.1–0.9)
MONOS PCT: 1 %
NEUTROS ABS: 3.9 10*3/uL (ref 1.5–6.5)
NEUTROS PCT: 12 %
Platelets: 278 10*3/uL (ref 145–400)
RBC: 3.71 MIL/uL (ref 3.70–5.45)
RDW: 16.3 % — AB (ref 11.2–14.5)
WBC: 31.7 10*3/uL — ABNORMAL HIGH (ref 3.9–10.3)

## 2018-02-11 LAB — RETICULOCYTES
RBC.: 3.72 MIL/uL (ref 3.70–5.45)
Retic Count, Absolute: 74.4 10*3/uL (ref 33.7–90.7)
Retic Ct Pct: 2 % (ref 0.7–2.1)

## 2018-02-11 LAB — FERRITIN: Ferritin: 194 ng/mL (ref 9–269)

## 2018-02-11 NOTE — Telephone Encounter (Signed)
Printed avs and calender of upcoming appointment. Per 4/11 los 

## 2018-02-11 NOTE — Patient Instructions (Signed)
Thank you for choosing [facility] to provide your oncology and hematology care.  To afford each patient quality time with our providers, please arrive 30 minutes before your scheduled appointment time.  If you arrive late for your appointment, you may be asked to reschedule.  We strive to give you quality time with our providers, and arriving late affects you and other patients whose appointments are after yours.   If you are a no show for multiple scheduled visits, you may be dismissed from the clinic at the providers discretion.    Again, thank you for choosing [facility], our hope is that these requests will decrease the amount of time that you wait before being seen by our physicians.  ______________________________________________________________________  Should you have questions after your visit to the [facility], please contact our office at [phone] between the hours of 8:30 and 4:30 p.m.    Voicemails left after 4:30p.m will not be returned until the following business day.    For prescription refill requests, please have your pharmacy contact us directly.  Please also try to allow 48 hours for prescription requests.    Please contact the scheduling department for questions regarding scheduling.  For scheduling of procedures such as PET scans, CT scans, MRI, Ultrasound, etc please contact central scheduling at [phone].    Resources For Cancer Patients and Caregivers:   Oncolink.org:  A wonderful resource for patients and healthcare providers for information regarding your disease, ways to tract your treatment, what to expect, etc.     American Cancer Society:  800-227-2345  Can help patients locate various types of support and financial assistance  Cancer Care: 1-800-813-HOPE (4673) Provides financial assistance, online support groups, medication/co-pay assistance.    [place] DSS:  336-641-3447 Where to apply for food  stamps, Medicaid, and utility assistance  Medicare Rights Center: 800-333-4114 Helps people with Medicare understand their rights and benefits, navigate the Medicare system, and secure the quality healthcare they deserve  [employer]: 336-333-6589  [employer]'s shared-ride transportation service for eligible riders who have a disability that prevents them from riding the fixed route bus.    For additional information on assistance programs please contact our social worker:   Grier Hock/Abigail Elmore:  [phone]            

## 2018-03-03 ENCOUNTER — Encounter: Payer: Self-pay | Admitting: Hematology

## 2018-03-11 NOTE — Progress Notes (Signed)
Marland Kitchen    HEMATOLOGY/ONCOLOGY CLINIC NOTE  Date of Service: 03/15/18  Patient Care Team: Asencion Noble, MD as PCP - General Rourk, [name], MD as Consulting Physician (Gastroenterology)  CHIEF COMPLAINTS/PURPOSE OF CONSULTATION:  F/u for CLL  HISTORY OF PRESENTING ILLNESS:  Theresa Kramer is a wonderful 77 y.o. female who has been referred to [place] by Dr Asencion Noble, MD for evaluation and management of lymphocytosis.  Patient has a h/o B12 def, depression, arthritis and DDD and was recently admitted to the hospital with TIA vs delirium in the setting of UTI. 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 She is here for further evaluation of her newly diagnosed B-cell leukemia/lymphoma. Patient reports no fevers. Has some night sweats but those have been in the setting of hypoglycemia. No overt LNadenopathy. No abdominal pain or distension. No reported weight loss. Has chronic fatigue that hasnt significantly changed recently.   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 The patient's last visit with [place] was on 02/11/18. She is accompanied today by her daughter. The pt reports that she is doing well overall.   The pt reports that she has eaten better and her appetite has improved. She also notes that her fatigue has improved slightly since her last visit. She is looking forward to spending more time exercising in the pool soon. She also notes that she has cut back on her Ativan and Risperidone medications, which has made her feel more energized.   Lab results today (03/15/18) of CBC, CMP, and Reticulocytes is as follows: all values are WNL except for WBC at 40.3k, Hgb at 10.3, HCT at 32.9, RDW at 16.4, Lymphs Abs at 35.1k, Glucose at 167, Albumin at 3.4, Alk Phos at 162. Ferritin is WNL at 205. LDH is elevated at 284 Haptoglobin is  WNL  On review of systems, pt reports eating well, increased appetite, lessened fatigue, and denies fevers, chills, night sweats, noticing any new lumps or bumps, abdominal pains, leg swelling, and any other symptoms.   MEDICAL HISTORY:  Past Medical History:  Diagnosis Date  . Arthritis   . B12 deficiency   . Bronchitis    hx of  . Cancer ([place])   . Complication of anesthesia   . Depression    "not taking medication"  . Diabetes mellitus   . GERD (gastroesophageal reflux disease)   . H/O hiatal hernia   . Hyperlipidemia   . Hypertension   . Noncompliance with medication regimen   . Pinched cervical nerve root   . PONV (postoperative nausea and vomiting)   . Stroke ([place])   . Urinary tract infection    hx of    SURGICAL HISTORY: Past Surgical History:  Procedure Laterality Date  . ABDOMINAL HYSTERECTOMY    . ANTERIOR CERVICAL DECOMP/DISCECTOMY FUSION  03/15/2012   Procedure: ANTERIOR CERVICAL DECOMPRESSION/DISCECTOMY FUSION 2 LEVELS;  Surgeon: Ophelia Charter, MD;  Location: [facility];  Service: Neurosurgery;  Laterality: N/A;  Cervical four-five Cervical five six anterior cervical decompression with fusion interbody prothesis plating and bonegraft  . APPENDECTOMY    . BIOPSY  05/18/2017   Procedure: BIOPSY;  Surgeon: Daneil Dolin, MD;  [facility];  Service: Endoscopy;;  gastric bx's  . BONE CYST EXCISION     from lower back and foot  . CHOLECYSTECTOMY    .  COLONOSCOPY  2011   Dr. Gala Romney: prep compromised exam. diverticulosis, hemorrhoids. next tcs 2021  . COLONOSCOPY WITH PROPOFOL N/A 05/18/2017   Procedure: COLONOSCOPY WITH PROPOFOL;  Surgeon: Daneil Dolin, MD;  [facility];  Service: Endoscopy;  Laterality: N/A;  1100  . DILATION AND CURETTAGE OF UTERUS    . 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 Multiple distal rings dilated and disrupted as described above. Small  hiatal hernia.   Marland Kitchen ESOPHAGOGASTRODUODENOSCOPY (EGD) WITH PROPOFOL N/A 05/18/2017   Procedure: ESOPHAGOGASTRODUODENOSCOPY (EGD) WITH PROPOFOL;  Surgeon: Daneil Dolin, MD;  [facility];  Service: Endoscopy;  Laterality: N/A;  . EXCISION METACARPAL MASS Left 03/03/2016   Procedure: EXCISION OF LEFT  DORSAL MASS OF EXTENSOR TENDON;  Surgeon: Milly Jakob, MD;  Location: [place];  Service: Orthopedics;  Laterality: Left;  . EYE SURGERY     bilateral cataract surgery,   . HIP ARTHROPLASTY     right  . JOINT REPLACEMENT    . KNEE SURGERY     torn cartilage repair  . MALONEY DILATION N/A 09/19/2014   Procedure: Venia Minks DILATION;  Surgeon: Daneil Dolin, MD;  [facility];  Service: Endoscopy;  Laterality: N/A;  . ORTHOPEDIC SURGERY    . POLYPECTOMY  05/18/2017   Procedure: POLYPECTOMY;  Surgeon: Daneil Dolin, MD;  [facility];  Service: Endoscopy;;  polyp at hepatic flexure, descending colon  . ROTATOR CUFF REPAIR     "multiple in both shoulders" and total shoulder replacement in left arm  . SAVORY DILATION N/A 09/19/2014   Procedure: SAVORY DILATION;  Surgeon: Daneil Dolin, MD;  [facility];  Service: Endoscopy;  Laterality: N/A;  . TONSILLECTOMY      SOCIAL HISTORY: Social History   Socioeconomic History  . Marital status: Widowed    Spouse name: Not on file  . Number of children: Not on file  . Years of education: Not on file  . Highest education level: Not on file  Occupational History  . Not on file  Social Needs  . Financial resource strain: Not on file  . Food insecurity:    Worry: Not on file    Inability: Not on file  . Transportation needs:    Medical: Not on file    Non-medical: Not on file  Tobacco Use  . Smoking status: Former Smoker    Last attempt to quit: 05/13/1974    Years since quitting: 43.8  . Smokeless tobacco: Never Used  . Tobacco comment: stopped 25 years ago  Substance and Sexual  Activity  . Alcohol use: No  . Drug use: No  . Sexual activity: Not on file  Lifestyle  . Physical activity:    Days per week: Not on file    Minutes per session: Not on file  . Stress: Not on file  Relationships  . Social connections:    Talks on phone: Not on file    Gets together: Not on file    Attends religious service: Not on file    Active member of club or organization: Not on file    Attends meetings of clubs or organizations: Not on file    Relationship status: Not on file  . Intimate partner violence:    Fear of current or ex partner: Not on file    Emotionally abused: Not on file    Physically abused: Not on file  Forced sexual activity: Not on file  Other Topics Concern  . Not on file  Social History Narrative  . Not on file    FAMILY HISTORY: Family History  Problem Relation Age of Onset  . Cirrhosis Brother        etoh  . Colon cancer Neg Hx     ALLERGIES:  is allergic to meloxicam.  MEDICATIONS:  Current Outpatient Medications  Medication Sig Dispense Refill  . acetaminophen (TYLENOL ARTHRITIS PAIN) 650 MG CR tablet Take 1,300 mg by mouth 2 (two) times daily.     Marland Kitchen amLODipine (NORVASC) 5 MG tablet Take 5 mg by mouth at bedtime.     . citalopram (CELEXA) 20 MG tablet Take 20 mg by mouth daily.      . clopidogrel (PLAVIX) 75 MG tablet TAKE 1 TABLET BY MOUTH  DAILY 30 tablet 3  . clotrimazole (LOTRIMIN) 1 % cream Apply 1 application topically 2 (two) times daily as needed. For rash     . cyanocobalamin (,VITAMIN B-12,) 1000 MCG/ML injection Inject 1,000 mcg into the muscle every 30 (thirty) days. Usually on the 1st     . insulin glargine (LANTUS) 100 UNIT/ML injection Inject 45 Units into the skin at bedtime.     Marland Kitchen LORazepam (ATIVAN) 0.5 MG tablet Take 0.25 mg by mouth 2 (two) times daily. Breakfast and after lunch    . losartan-hydrochlorothiazide (HYZAAR) 100-25 MG tablet Take 1 tablet by mouth daily.    . metFORMIN (GLUCOPHAGE) 1000 MG tablet Take  500 mg by mouth 2 (two) times daily with a meal. After lunch and bedtime    . oxybutynin (DITROPAN-XL) 10 MG 24 hr tablet Take 10 mg by mouth at bedtime.     . pantoprazole (PROTONIX) 40 MG tablet Take 40 mg by mouth daily.    . pramipexole (MIRAPEX) 0.25 MG tablet Take 0.25 mg by mouth at bedtime.     . pravastatin (PRAVACHOL) 80 MG tablet Take 1 tablet (80 mg total) by mouth daily. (Patient taking differently: Take 80 mg by mouth at bedtime. ) 30 tablet 3  . ranitidine (ZANTAC) 150 MG tablet Take 150 mg by mouth at bedtime.    . risperiDONE (RISPERDAL) 0.5 MG tablet Take 0.5 mg by mouth daily. Breakfast and lunch     . traMADol (ULTRAM) 50 MG tablet Take 1 tablet (50 mg total) by mouth every 6 (six) hours as needed for moderate pain. 20 tablet 0  . traZODone (DESYREL) 50 MG tablet Take 50 mg by mouth at bedtime.     No current facility-administered medications for this visit.     REVIEW OF SYSTEMS:   A 10+ POINT REVIEW OF SYSTEMS WAS OBTAINED including neurology, dermatology, psychiatry, cardiac, respiratory, lymph, extremities, GI, GU, Musculoskeletal, constitutional, breasts, reproductive, HEENT.  All pertinent positives are noted in the HPI.  All others are negative.    PHYSICAL EXAMINATION: ECOG PERFORMANCE STATUS: 2 - Symptomatic, <50% confined to bed  . Vitals:   03/15/18 0955  BP: (!) 139/54  Pulse: 81  Resp: 17  Temp: 99.4 F (37.4 C)  SpO2: 98%   Filed Weights   03/15/18 0955  Weight: 192 lb 4.8 oz (87.2 kg)   .Body mass index is 33.01 kg/m.  GENERAL:alert, in no acute distress and comfortable SKIN: no acute rashes, no significant lesions EYES: conjunctiva are pink and non-injected, sclera anicteric OROPHARYNX: MMM, no exudates, no oropharyngeal erythema or ulceration NECK: supple, no JVD LYMPH:  no palpable lymphadenopathy in the  cervical, axillary or inguinal regions LUNGS: clear to auscultation b/l with normal respiratory effort HEART: regular rate &  rhythm ABDOMEN:  normoactive bowel sounds , non tender, not distended. Extremity: no pedal edema PSYCH: alert & oriented x 3 with fluent speech NEURO: no focal motor/sensory deficits   LABORATORY DATA:  I have reviewed the data as listed  . CBC Latest Ref Rng & Units 03/15/2018 02/11/2018 10/14/2017  WBC 3.9 - 10.3 K/uL 40.3(H) 31.7(H) 20.3(H)  Hemoglobin 11.6 - 15.9 g/dL 10.3(L) 9.9(L) 11.2(L)  Hematocrit 34.8 - 46.6 % 32.9(L) 31.2(L) 35.0  Platelets 145 - 400 K/uL 313 278 238   . CBC    Component Value Date/Time   WBC 40.3 (H) 03/15/2018 0919   WBC 31.7 (H) 02/11/2018 0814   RBC 3.95 03/15/2018 0919   RBC 3.89 03/15/2018 0919   HGB 10.3 (L) 03/15/2018 0919   HGB 11.2 (L) 10/14/2017 0824   HCT 32.9 (L) 03/15/2018 0919   HCT 35.0 10/14/2017 0824   PLT 313 03/15/2018 0919   PLT 238 10/14/2017 0824   MCV 83.3 03/15/2018 0919   MCV 84.5 10/14/2017 0824   MCH 26.2 03/15/2018 0919   MCHC 31.5 03/15/2018 0919   RDW 16.4 (H) 03/15/2018 0919   RDW 19.9 (H) 10/14/2017 0824   LYMPHSABS 35.1 (H) 03/15/2018 0919   LYMPHSABS 16.2 (H) 10/14/2017 0824   MONOABS 0.2 03/15/2018 0919   MONOABS 0.1 10/14/2017 0824   EOSABS 0.1 03/15/2018 0919   EOSABS 0.1 10/14/2017 0824   BASOSABS 0.1 03/15/2018 0919   BASOSABS 0.0 10/14/2017 0824     . CMP Latest Ref Rng & Units 03/15/2018 02/11/2018 10/14/2017  Glucose 70 - 140 mg/dL 167(H) 202(H) 106  BUN 7 - 26 mg/dL 18 19 21.7  Creatinine 0.60 - 1.10 mg/dL 0.95 0.94 0.9  Sodium 136 - 145 mmol/L 139 140 139  Potassium 3.5 - 5.1 mmol/L 4.3 4.1 4.6  Chloride 98 - 109 mmol/L 104 105 -  CO2 22 - 29 mmol/L _0 Calcium 8.4 - 10.4 mg/dL 9.2 9.3 9.3  Total Protein 6.4 - 8.3 g/dL 7.8 7.2 7.7  Total Bilirubin 0.2 - 1.2 mg/dL 0.4 0.4 0.45  Alkaline Phos 40 - 150 U/L 162(H) 157(H) 96  AST 5 - 34 U/L _1 ALT 0 - 55 U/L _2 Lab Results  Component Value Date   FERRITIN 205 03/15/2018             . Lab Results   Component Value Date   LDH 284 (H) 03/15/2018         RADIOGRAPHIC STUDIES: I have personally reviewed the radiological images as listed and agreed with the findings in the report. No results found.  PET/CT 10/25/017:  IMPRESSION: 1. Mildly enlarged and mildly hypermetabolic spleen, consistent with the provided history of a lymphoproliferative disorder. 2. No hypermetabolic lymphadenopathy or other hypermetabolic sites of disease. 3. Additional findings include aortic atherosclerosis, three-vessel coronary atherosclerosis, small hiatal hernia and small fat containing left inguinal hernia.   Electronically Signed   By: Ilona Sorrel M.D.   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 Noted to have elevated wbc/lymphocyte counts end of September. Unknown if she had a more chronic elevation of her WBC counts that might put a timeline on her lymphocytosis. -Differential  diagnosis- CD5 neg variant of CLL (about 17-20% patients with CLL could be CD5neg) vs B-cell leukemia/lymphoma -PET/CT with no significant LNadenopathy and PET/CT with minimal enlarged and minimally hypermetabolic. FISH shows Trisomy 3 which would be consistent with CLL Features consistent with indolent chronic lymphoproliferative process - likely CLL  PLAN -Discussed pt labwork today, 03/15/18; Hgb improved to 10.3, WBC have continued to increase to 40k, PLT are stable. LDH is elevated at 284, Ferritin is normal.  -Discussed that the pt's labs do not suggest a need to begin treatment of her CLL at this time as anemia has improved and platelets are normal -Would nevertheless like to keep a closer eye on blood counts and begin to see pt every 2 months   2) Anemia - microcytic likely related to iron deficiency previously .  Plan -continue Polysaccharide iron 117m po BID.  She also has been taking Protonix -Hgb improved to 10.3 -No indication for IV iron at this time . 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 Brings up the possibility of lymphoplasmacytic lymphoma however trisomy 12 is typical for CLL and uncommon in LPL/WM M spike 0.4g/dl unchanged  4). Patient Active Problem List   Diagnosis Date Noted  . History of stroke 08/04/2017  . IDA (iron deficiency anemia) 03/25/2017  . Somnolence, daytime 12/31/2016  . Snoring 12/31/2016  . Occipital neuralgia   . Acute ischemic stroke ([place]   . Stroke ([place]   . 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 08/01/2016  . Leukocytosis 08/01/2016  . Urinary tract infectious disease 08/01/2016  . Type 2 diabetes mellitus without complication, with long-term current use of insulin ([place] 07/30/2016  . Essential hypertension 07/30/2016  . Osteoarthritis 07/30/2016  . Normocytic anemia 07/30/2016  . HLD (hyperlipidemia)   . Schatzki's ring   . Cervical herniated disc 03/15/2012  . GERD 04/29/2010  . CHEST PAIN, ATYPICAL 04/29/2010  . DYSPHAGIA UNSPECIFIED 04/29/2010  -continue rf/u with PCP.   RTC with Dr KIrene Limboin 2 months with labs     All of the patients questions were answered with apparent satisfaction. The patient knows to call the clinic with any problems, questions or concerns.   The toal time spent in the appt was 20 minutes and more than 50% was on counseling and direct patient cares.    GSullivan LoneMD MCitrus HeightsAAHIVMS SRehabilitation Hospital Of Indiana IncCChristus Schumpert Medical CenterHematology/Oncology Physician [place] (Office):       [phone](Work cell):  [phone](Fax):           [phone] This document serves as a record of services personally performed by GSullivan Lone MD. It was created on his behalf  by SBaldwin Jamaica a trained medical scribe. The creation of this record is based on the scribe's personal observations and the provider's statements to them.   .I have reviewed the above documentation for accuracy and completeness, and I agree with the above. .Brunetta GeneraMD MS

## 2018-03-15 ENCOUNTER — Inpatient Hospital Stay (HOSPITAL_BASED_OUTPATIENT_CLINIC_OR_DEPARTMENT_OTHER): Payer: Medicare Other | Admitting: Hematology

## 2018-03-15 ENCOUNTER — Other Ambulatory Visit: Payer: Medicare Other

## 2018-03-15 ENCOUNTER — Telehealth: Payer: Self-pay | Admitting: Hematology

## 2018-03-15 ENCOUNTER — Ambulatory Visit: Payer: Medicare Other | Admitting: Hematology

## 2018-03-15 ENCOUNTER — Inpatient Hospital Stay: Payer: Medicare Other | Attending: Hematology

## 2018-03-15 VITALS — BP 139/54 | HR 81 | Temp 99.4°F | Resp 17 | Ht 64.0 in | Wt 192.3 lb

## 2018-03-15 DIAGNOSIS — D509 Iron deficiency anemia, unspecified: Secondary | ICD-10-CM

## 2018-03-15 DIAGNOSIS — E538 Deficiency of other specified B group vitamins: Secondary | ICD-10-CM | POA: Diagnosis not present

## 2018-03-15 DIAGNOSIS — D649 Anemia, unspecified: Secondary | ICD-10-CM

## 2018-03-15 DIAGNOSIS — C9110 Chronic lymphocytic leukemia of B-cell type not having achieved remission: Secondary | ICD-10-CM | POA: Diagnosis not present

## 2018-03-15 LAB — CBC WITH DIFFERENTIAL (CANCER CENTER ONLY)
Basophils Absolute: 0.1 10*3/uL (ref 0.0–0.1)
Basophils Relative: 0 %
Eosinophils Absolute: 0.1 10*3/uL (ref 0.0–0.5)
Eosinophils Relative: 0 %
HEMATOCRIT: 32.9 % — AB (ref 34.8–46.6)
HEMOGLOBIN: 10.3 g/dL — AB (ref 11.6–15.9)
LYMPHS ABS: 35.1 10*3/uL — AB (ref 0.9–3.3)
Lymphocytes Relative: 87 %
MCH: 26.2 pg (ref 25.1–34.0)
MCHC: 31.5 g/dL (ref 31.5–36.0)
MCV: 83.3 fL (ref 79.5–101.0)
Monocytes Absolute: 0.2 10*3/uL (ref 0.1–0.9)
Monocytes Relative: 1 %
NEUTROS PCT: 12 %
Neutro Abs: 4.7 10*3/uL (ref 1.5–6.5)
Platelet Count: 313 10*3/uL (ref 145–400)
RBC: 3.95 MIL/uL (ref 3.70–5.45)
RDW: 16.4 % — AB (ref 11.2–14.5)
WBC Count: 40.3 10*3/uL — ABNORMAL HIGH (ref 3.9–10.3)

## 2018-03-15 LAB — CMP (CANCER CENTER ONLY)
ALK PHOS: 162 U/L — AB (ref 40–150)
ALT: 14 U/L (ref 0–55)
ANION GAP: 10 (ref 3–11)
AST: 13 U/L (ref 5–34)
Albumin: 3.4 g/dL — ABNORMAL LOW (ref 3.5–5.0)
BUN: 18 mg/dL (ref 7–26)
CALCIUM: 9.2 mg/dL (ref 8.4–10.4)
CO2: 25 mmol/L (ref 22–29)
Chloride: 104 mmol/L (ref 98–109)
Creatinine: 0.95 mg/dL (ref 0.60–1.10)
GFR, Estimated: 56 mL/min — ABNORMAL LOW (ref >60)
Glucose, Bld: 167 mg/dL — ABNORMAL HIGH (ref 70–140)
Potassium: 4.3 mmol/L (ref 3.5–5.1)
SODIUM: 139 mmol/L (ref 136–145)
Total Bilirubin: 0.4 mg/dL (ref 0.2–1.2)
Total Protein: 7.8 g/dL (ref 6.4–8.3)

## 2018-03-15 LAB — RETICULOCYTES
RBC.: 3.89 MIL/uL (ref 3.70–5.45)
RETIC COUNT ABSOLUTE: 62.2 10*3/uL (ref 33.7–90.7)
Retic Ct Pct: 1.6 % (ref 0.7–2.1)

## 2018-03-15 LAB — LACTATE DEHYDROGENASE: LDH: 284 U/L — AB (ref 125–245)

## 2018-03-15 LAB — FERRITIN: Ferritin: 205 ng/mL (ref 9–269)

## 2018-03-15 NOTE — Telephone Encounter (Signed)
Appointments scheduled AVS printed per 5/13 los

## 2018-03-16 LAB — HEPATITIS B SURFACE ANTIGEN: HEP B S AG: NEGATIVE

## 2018-03-16 LAB — HEPATITIS C ANTIBODY

## 2018-03-16 LAB — HAPTOGLOBIN: HAPTOGLOBIN: 425 mg/dL — AB (ref 34–200)

## 2018-03-16 LAB — HEPATITIS B CORE ANTIBODY, TOTAL: HEP B C TOTAL AB: NEGATIVE

## 2018-03-18 DIAGNOSIS — E1129 Type 2 diabetes mellitus with other diabetic kidney complication: Secondary | ICD-10-CM | POA: Diagnosis not present

## 2018-03-24 ENCOUNTER — Other Ambulatory Visit: Payer: Medicare Other

## 2018-03-24 ENCOUNTER — Ambulatory Visit: Payer: Medicare Other | Admitting: Hematology

## 2018-03-31 DIAGNOSIS — E1129 Type 2 diabetes mellitus with other diabetic kidney complication: Secondary | ICD-10-CM | POA: Diagnosis not present

## 2018-03-31 DIAGNOSIS — C9110 Chronic lymphocytic leukemia of B-cell type not having achieved remission: Secondary | ICD-10-CM | POA: Diagnosis not present

## 2018-04-08 ENCOUNTER — Other Ambulatory Visit: Payer: Medicare Other

## 2018-04-08 ENCOUNTER — Ambulatory Visit: Payer: Medicare Other | Admitting: Hematology

## 2018-05-17 NOTE — Progress Notes (Signed)
Marland Kitchen    HEMATOLOGY/ONCOLOGY CLINIC NOTE  Date of Service: 03/15/18  Patient Care Team: Asencion Noble, MD as PCP - General Rourk, [name], MD as Consulting Physician (Gastroenterology)  CHIEF COMPLAINTS/PURPOSE OF CONSULTATION:  F/u for CLL  HISTORY OF PRESENTING ILLNESS:  Theresa Kramer is a wonderful 77 y.o. female who has been referred to [place] by Dr Asencion Noble, MD for evaluation and management of lymphocytosis.  Patient has a h/o B12 def, depression, arthritis and DDD and was recently admitted to the hospital with TIA vs delirium in the setting of UTI. 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 She is here for further evaluation of her newly diagnosed B-cell leukemia/lymphoma. Patient reports no fevers. Has some night sweats but those have been in the setting of hypoglycemia. No overt LNadenopathy. No abdominal pain or distension. No reported weight loss. Has chronic fatigue that hasnt significantly changed recently.   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 The patient's last visit with [place] was on 03/15/18. She is accompanied today by her daughter. The pt reports that she is doing well overall and recently enjoyed her trip to the beach with family.   The pt reports that she has no new concerns or symptoms. She notes that going to the beach has rejuvenated her and she has become more active and has been eating better.   She has moved to Risperidone once each day, and Ativan once a day as well.   The pt notes that she is having labs with her PCP every 3 months.   Lab results today (05/18/18) of CBC w/diff, CMP is as follows: all values are WNL except for WBC at 46.8k, HGB at 9.6, HCT at 30.6, MCHC at 31.4, RDW at 16.5, Lymphs abs at 41.6k, Glucose at 136, Albumin at 3.2, AST at 11, Alk Phos at 150, GFR at 56. LDH 05/18/18 is  elevated at 270  On review of systems, pt reports eating well, good energy levels, and denies fevers, chills, night sweats, unexpected weight loss, back pains, abdominal pains, and any other symptoms.   MEDICAL HISTORY:  Past Medical History:  Diagnosis Date  . Arthritis   . B12 deficiency   . Bronchitis    hx of  . Cancer ([place])   . Complication of anesthesia   . Depression    "not taking medication"  . Diabetes mellitus   . GERD (gastroesophageal reflux disease)   . H/O hiatal hernia   . Hyperlipidemia   . Hypertension   . Noncompliance with medication regimen   . Pinched cervical nerve root   . PONV (postoperative nausea and vomiting)   . Stroke ([place])   . Urinary tract infection    hx of    SURGICAL HISTORY: Past Surgical History:  Procedure Laterality Date  . ABDOMINAL HYSTERECTOMY    . ANTERIOR CERVICAL DECOMP/DISCECTOMY FUSION  03/15/2012   Procedure: ANTERIOR CERVICAL DECOMPRESSION/DISCECTOMY FUSION 2 LEVELS;  Surgeon: Ophelia Charter, MD;  Location: [place] [facility];  Service: Neurosurgery;  Laterality: N/A;  Cervical four-five Cervical five six anterior cervical decompression with fusion interbody prothesis plating and bonegraft  . APPENDECTOMY    . BIOPSY  05/18/2017   Procedure: BIOPSY;  Surgeon: Daneil Dolin, MD;  [facility];  Service: Endoscopy;;  gastric bx's  . BONE CYST EXCISION  from lower back and foot  . CHOLECYSTECTOMY    . COLONOSCOPY  2011   Dr. Gala Romney: prep compromised exam. diverticulosis, hemorrhoids. next tcs 2021  . COLONOSCOPY WITH PROPOFOL N/A 05/18/2017   Procedure: COLONOSCOPY WITH PROPOFOL;  Surgeon: Daneil Dolin, MD;  [facility];  Service: Endoscopy;  Laterality: N/A;  1100  . DILATION AND CURETTAGE OF UTERUS    . 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 Multiple distal rings dilated and disrupted as described above. Small hiatal hernia.     Marland Kitchen ESOPHAGOGASTRODUODENOSCOPY (EGD) WITH PROPOFOL N/A 05/18/2017   Procedure: ESOPHAGOGASTRODUODENOSCOPY (EGD) WITH PROPOFOL;  Surgeon: Daneil Dolin, MD;  [facility];  Service: Endoscopy;  Laterality: N/A;  . EXCISION METACARPAL MASS Left 03/03/2016   Procedure: EXCISION OF LEFT  DORSAL MASS OF EXTENSOR TENDON;  Surgeon: Milly Jakob, MD;  Location: [facility];  Service: Orthopedics;  Laterality: Left;  . EYE SURGERY     bilateral cataract surgery,   . HIP ARTHROPLASTY     right  . JOINT REPLACEMENT    . KNEE SURGERY     torn cartilage repair  . MALONEY DILATION N/A 09/19/2014   Procedure: Venia Minks DILATION;  Surgeon: Daneil Dolin, MD;  [facility];  Service: Endoscopy;  Laterality: N/A;  . ORTHOPEDIC SURGERY    . POLYPECTOMY  05/18/2017   Procedure: POLYPECTOMY;  Surgeon: Daneil Dolin, MD;  [facility];  Service: Endoscopy;;  polyp at hepatic flexure, descending colon  . ROTATOR CUFF REPAIR     "multiple in both shoulders" and total shoulder replacement in left arm  . SAVORY DILATION N/A 09/19/2014   Procedure: SAVORY DILATION;  Surgeon: Daneil Dolin, MD;  [facility];  Service: Endoscopy;  Laterality: N/A;  . TONSILLECTOMY      SOCIAL HISTORY: Social History   Socioeconomic History  . Marital status: Widowed    Spouse name: Not on file  . Number of children: Not on file  . Years of education: Not on file  . Highest education level: Not on file  Occupational History  . Not on file  Social Needs  . Financial resource strain: Not on file  . Food insecurity:    Worry: Not on file    Inability: Not on file  . Transportation needs:    Medical: Not on file    Non-medical: Not on file  Tobacco Use  . Smoking status: Former Smoker    Last attempt to quit: 05/13/1974    Years since quitting: 44.0  . Smokeless tobacco: Never Used  . Tobacco comment: stopped 25 years ago  Substance and Sexual Activity  .  Alcohol use: No  . Drug use: No  . Sexual activity: Not on file  Lifestyle  . Physical activity:    Days per week: Not on file    Minutes per session: Not on file  . Stress: Not on file  Relationships  . Social connections:    Talks on phone: Not on file    Gets together: Not on file    Attends religious service: Not on file    Active member of club or organization: Not on file    Attends meetings of clubs or organizations: Not on file    Relationship status: Not on file  . Intimate partner violence:    Fear of current or ex partner: Not on file    Emotionally  abused: Not on file    Physically abused: Not on file    Forced sexual activity: Not on file  Other Topics Concern  . Not on file  Social History Narrative  . Not on file    FAMILY HISTORY: Family History  Problem Relation Age of Onset  . Cirrhosis Brother        etoh  . Colon cancer Neg Hx     ALLERGIES:  is allergic to meloxicam.  MEDICATIONS:  Current Outpatient Medications  Medication Sig Dispense Refill  . acetaminophen (TYLENOL ARTHRITIS PAIN) 650 MG CR tablet Take 1,300 mg by mouth 2 (two) times daily.     Marland Kitchen amLODipine (NORVASC) 5 MG tablet Take 5 mg by mouth at bedtime.     . citalopram (CELEXA) 20 MG tablet Take 20 mg by mouth daily.      . clopidogrel (PLAVIX) 75 MG tablet TAKE 1 TABLET BY MOUTH  DAILY 30 tablet 3  . clotrimazole (LOTRIMIN) 1 % cream Apply 1 application topically 2 (two) times daily as needed. For rash     . cyanocobalamin (,VITAMIN B-12,) 1000 MCG/ML injection Inject 1,000 mcg into the muscle every 30 (thirty) days. Usually on the 1st     . insulin glargine (LANTUS) 100 UNIT/ML injection Inject 45 Units into the skin at bedtime.     Marland Kitchen LORazepam (ATIVAN) 0.5 MG tablet Take 0.25 mg by mouth 2 (two) times daily. Breakfast and after lunch    . losartan-hydrochlorothiazide (HYZAAR) 100-25 MG tablet Take 1 tablet by mouth daily.    . metFORMIN (GLUCOPHAGE) 1000 MG tablet Take 500 mg by  mouth 2 (two) times daily with a meal. After lunch and bedtime    . oxybutynin (DITROPAN-XL) 10 MG 24 hr tablet Take 10 mg by mouth at bedtime.     . pantoprazole (PROTONIX) 40 MG tablet Take 40 mg by mouth daily.    . pramipexole (MIRAPEX) 0.25 MG tablet Take 0.25 mg by mouth at bedtime.     . pravastatin (PRAVACHOL) 80 MG tablet Take 1 tablet (80 mg total) by mouth daily. (Patient taking differently: Take 80 mg by mouth at bedtime. ) 30 tablet 3  . ranitidine (ZANTAC) 150 MG tablet Take 150 mg by mouth at bedtime.    . risperiDONE (RISPERDAL) 0.5 MG tablet Take 0.5 mg by mouth daily. Breakfast and lunch     . traMADol (ULTRAM) 50 MG tablet Take 1 tablet (50 mg total) by mouth every 6 (six) hours as needed for moderate pain. 20 tablet 0  . traZODone (DESYREL) 50 MG tablet Take 50 mg by mouth at bedtime.     No current facility-administered medications for this visit.     REVIEW OF SYSTEMS:   A 10+ POINT REVIEW OF SYSTEMS WAS OBTAINED including neurology, dermatology, psychiatry, cardiac, respiratory, lymph, extremities, GI, GU, Musculoskeletal, constitutional, breasts, reproductive, HEENT.  All pertinent positives are noted in the HPI.  All others are negative.    PHYSICAL EXAMINATION: ECOG PERFORMANCE STATUS: 2 - Symptomatic, <50% confined to bed  . Vitals:   05/18/18 0946 05/18/18 0947  BP: (!) 73/63 (!) 112/55  Pulse: 85 85  Resp: 18   Temp: 98.7 F (37.1 C)   SpO2: 97%    Filed Weights   05/18/18 0946  Weight: 188 lb 3.2 oz (85.4 kg)   .Body mass index is 32.3 kg/m.  GENERAL:alert, in no acute distress and comfortable SKIN: no acute rashes, no significant lesions EYES: conjunctiva are pink and  non-injected, sclera anicteric OROPHARYNX: MMM, no exudates, no oropharyngeal erythema or ulceration NECK: supple, no JVD LYMPH:  no palpable lymphadenopathy in the cervical, axillary or inguinal regions LUNGS: clear to auscultation b/l with normal respiratory effort HEART:  regular rate & rhythm ABDOMEN:  normoactive bowel sounds , non tender, not distended. No palpable hepatosplenomegaly.  Extremity: no pedal edema PSYCH: alert & oriented x 3 with fluent speech NEURO: no focal motor/sensory deficits   LABORATORY DATA:  I have reviewed the data as listed  . CBC Latest Ref Rng & Units 05/18/2018 03/15/2018 02/11/2018  WBC 3.9 - 10.3 K/uL 46.8(H) 40.3(H) 31.7(H)  Hemoglobin 11.6 - 15.9 g/dL 9.6(L) 10.3(L) 9.9(L)  Hematocrit 34.8 - 46.6 % 30.6(L) 32.9(L) 31.2(L)  Platelets 145 - 400 K/uL 295 313 278   . CBC    Component Value Date/Time   WBC 46.8 (H) 05/18/2018 0843   RBC 3.71 05/18/2018 0843   HGB 9.6 (L) 05/18/2018 0843   HGB 10.3 (L) 03/15/2018 0919   HGB 11.2 (L) 10/14/2017 0824   HCT 30.6 (L) 05/18/2018 0843   HCT 35.0 10/14/2017 0824   PLT 295 05/18/2018 0843   PLT 313 03/15/2018 0919   PLT 238 10/14/2017 0824   MCV 82.4 05/18/2018 0843   MCV 84.5 10/14/2017 0824   MCH 25.8 05/18/2018 0843   MCHC 31.4 (L) 05/18/2018 0843   RDW 16.5 (H) 05/18/2018 0843   RDW 19.9 (H) 10/14/2017 0824   LYMPHSABS 41.6 (H) 05/18/2018 0843   LYMPHSABS 16.2 (H) 10/14/2017 0824   MONOABS 0.4 05/18/2018 0843   MONOABS 0.1 10/14/2017 0824   EOSABS 0.1 05/18/2018 0843   EOSABS 0.1 10/14/2017 0824   BASOSABS 0.2 (H) 05/18/2018 0843   BASOSABS 0.0 10/14/2017 0824     . CMP Latest Ref Rng & Units 05/18/2018 03/15/2018 02/11/2018  Glucose 70 - 99 mg/dL 136(H) 167(H) 202(H)  BUN 8 - 23 mg/dL _0 Creatinine 0.44 - 1.00 mg/dL 0.95 0.95 0.94  Sodium 135 - 145 mmol/L 140 139 140  Potassium 3.5 - 5.1 mmol/L 4.2 4.3 4.1  Chloride 98 - 111 mmol/L 104 104 105  CO2 22 - 32 mmol/L _1 Calcium 8.9 - 10.3 mg/dL 9.1 9.2 9.3  Total Protein 6.5 - 8.1 g/dL 7.4 7.8 7.2  Total Bilirubin 0.3 - 1.2 mg/dL 0.4 0.4 0.4  Alkaline Phos 38 - 126 U/L 150(H) 162(H) 157(H)  AST 15 - 41 U/L 11(L) 13 10  ALT 0 - 44 U/L _2 Lab Results  Component Value Date    FERRITIN 205 03/15/2018           . Lab Results  Component Value Date   LDH 270 (H) 05/18/2018         RADIOGRAPHIC STUDIES: I have personally reviewed the radiological images as listed and agreed with the findings in the report. No results found.  PET/CT 10/25/017:  IMPRESSION: 1. Mildly enlarged and mildly hypermetabolic spleen, consistent with the provided history of a lymphoproliferative disorder. 2. No hypermetabolic lymphadenopathy or other hypermetabolic sites of disease. 3. Additional findings include aortic atherosclerosis, three-vessel coronary atherosclerosis, small hiatal hernia and small fat containing left inguinal hernia.   Electronically Signed   By: Ilona Sorrel M.D.   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 Noted to have  elevated wbc/lymphocyte counts end of September. Unknown if she had a more chronic elevation of her WBC counts that might put a timeline on her lymphocytosis. -Differential diagnosis- CD5 neg variant of CLL (about 17-20% patients with CLL could be CD5neg) vs B-cell leukemia/lymphoma -08/26/16 PET/CT with no significant LNadenopathy and PET/CT with minimal enlarged and minimally hypermetabolic. FISH shows Trisomy 95 which would be consistent with CLL Features consistent with indolent chronic lymphoproliferative process - likely CLL   PLAN:  -Discussed pt labwork today, 05/18/18; Lymphs increased from 35.1k to 41.6k, HGB decreased from 10.3 to 9.6. PLT normal at 295k. LDH stable at 270. -Will see pt back in 3-4 months unless the pt develops any new or concerning symptoms before then -The pt shows no clinical or lab progression of CLL at this time.  -No indication for treatment at this time.   2) Anemia - microcytic likely related to iron deficiency previously .  Plan -continue  Polysaccharide iron 13m po BID. She also has been taking Protonix3 -HGB at 9.6 on 05/18/18 -No indication for IV iron at this time . 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 Brings up the possibility of lymphoplasmacytic lymphoma however trisomy 12 is typical for CLL and uncommon in LPL/WM M spike 0.4g/dl unchanged  4). Patient Active Problem List   Diagnosis Date Noted  . History of stroke 08/04/2017  . IDA (iron deficiency anemia) 03/25/2017  . Somnolence, daytime 12/31/2016  . Snoring 12/31/2016  . Occipital neuralgia   . Acute ischemic stroke ([place]   . Stroke ([place]   . 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 Leukocytosis 08/01/2016  . Urinary tract infectious disease 08/01/2016  . Type 2 diabetes mellitus without complication, with long-term current use of insulin ([place] 07/30/2016  . Essential hypertension 07/30/2016  . Osteoarthritis 07/30/2016  . Normocytic anemia 07/30/2016  . HLD (hyperlipidemia)   . Schatzki's ring   . Cervical herniated disc 03/15/2012  . GERD 04/29/2010  . CHEST PAIN, ATYPICAL 04/29/2010  . DYSPHAGIA UNSPECIFIED 04/29/2010  -continue rf/u with PCP.   RTC with Dr kale in 14 weeks with labs     All of the patients questions were answered with apparent satisfaction. The patient knows to call the clinic with any problems, questions or concerns.   The total time spent in the appt was 20 minutes and more than 50% was on counseling and direct patient cares.    GSullivan LoneMD MSomersAAHIVMS SEye [facility] LLCCMidmichigan Medical Center-GladwinHematology/Oncology Physician CLe Bonheur [facility] (Office):       [phone](Work cell):  [phone](Fax):           [phone] I, SBaldwin [place] am acting as a scribe for Dr KIrene Limbo   .I have reviewed the  above documentation for accuracy and completeness, and I agree with the above. .Brunetta GeneraMD

## 2018-05-18 ENCOUNTER — Inpatient Hospital Stay: Payer: Medicare Other | Admitting: Hematology

## 2018-05-18 ENCOUNTER — Inpatient Hospital Stay: Payer: Medicare Other | Attending: Hematology

## 2018-05-18 ENCOUNTER — Telehealth: Payer: Self-pay | Admitting: Hematology

## 2018-05-18 VITALS — BP 112/55 | HR 85 | Temp 98.7°F | Resp 18 | Ht 64.0 in | Wt 188.2 lb

## 2018-05-18 DIAGNOSIS — D649 Anemia, unspecified: Secondary | ICD-10-CM

## 2018-05-18 DIAGNOSIS — E538 Deficiency of other specified B group vitamins: Secondary | ICD-10-CM

## 2018-05-18 DIAGNOSIS — D509 Iron deficiency anemia, unspecified: Secondary | ICD-10-CM | POA: Diagnosis not present

## 2018-05-18 DIAGNOSIS — C9190 Lymphoid leukemia, unspecified not having achieved remission: Secondary | ICD-10-CM | POA: Insufficient documentation

## 2018-05-18 DIAGNOSIS — C9110 Chronic lymphocytic leukemia of B-cell type not having achieved remission: Secondary | ICD-10-CM | POA: Diagnosis present

## 2018-05-18 LAB — CMP (CANCER CENTER ONLY)
ALBUMIN: 3.2 g/dL — AB (ref 3.5–5.0)
ALT: 11 U/L (ref 0–44)
ANION GAP: 10 (ref 5–15)
AST: 11 U/L — ABNORMAL LOW (ref 15–41)
Alkaline Phosphatase: 150 U/L — ABNORMAL HIGH (ref 38–126)
BUN: 16 mg/dL (ref 8–23)
CHLORIDE: 104 mmol/L (ref 98–111)
CO2: 26 mmol/L (ref 22–32)
Calcium: 9.1 mg/dL (ref 8.9–10.3)
Creatinine: 0.95 mg/dL (ref 0.44–1.00)
GFR, Estimated: 56 mL/min — ABNORMAL LOW (ref >60)
GLUCOSE: 136 mg/dL — AB (ref 70–99)
Potassium: 4.2 mmol/L (ref 3.5–5.1)
SODIUM: 140 mmol/L (ref 135–145)
Total Bilirubin: 0.4 mg/dL (ref 0.3–1.2)
Total Protein: 7.4 g/dL (ref 6.5–8.1)

## 2018-05-18 LAB — CBC WITH DIFFERENTIAL/PLATELET
Basophils Absolute: 0.2 10*3/uL — ABNORMAL HIGH (ref 0.0–0.1)
Basophils Relative: 0 %
EOS ABS: 0.1 10*3/uL (ref 0.0–0.5)
EOS PCT: 0 %
HCT: 30.6 % — ABNORMAL LOW (ref 34.8–46.6)
HEMOGLOBIN: 9.6 g/dL — AB (ref 11.6–15.9)
LYMPHS ABS: 41.6 10*3/uL — AB (ref 0.9–3.3)
LYMPHS PCT: 89 %
MCH: 25.8 pg (ref 25.1–34.0)
MCHC: 31.4 g/dL — AB (ref 31.5–36.0)
MCV: 82.4 fL (ref 79.5–101.0)
MONOS PCT: 1 %
Monocytes Absolute: 0.4 10*3/uL (ref 0.1–0.9)
NEUTROS PCT: 10 %
Neutro Abs: 4.6 10*3/uL (ref 1.5–6.5)
PLATELETS: 295 10*3/uL (ref 145–400)
RBC: 3.71 MIL/uL (ref 3.70–5.45)
RDW: 16.5 % — ABNORMAL HIGH (ref 11.2–14.5)
WBC: 46.8 10*3/uL — ABNORMAL HIGH (ref 3.9–10.3)

## 2018-05-18 LAB — LACTATE DEHYDROGENASE: LDH: 270 U/L — AB (ref 98–192)

## 2018-05-18 NOTE — Telephone Encounter (Signed)
Gave patient avs report and appointments for October  °

## 2018-05-19 ENCOUNTER — Telehealth: Payer: Self-pay

## 2018-05-19 NOTE — Telephone Encounter (Signed)
Only checked patient off the DAR completed by M.D.

## 2018-07-07 DIAGNOSIS — E1129 Type 2 diabetes mellitus with other diabetic kidney complication: Secondary | ICD-10-CM | POA: Diagnosis not present

## 2018-07-14 DIAGNOSIS — E1129 Type 2 diabetes mellitus with other diabetic kidney complication: Secondary | ICD-10-CM | POA: Diagnosis not present

## 2018-07-14 DIAGNOSIS — C9110 Chronic lymphocytic leukemia of B-cell type not having achieved remission: Secondary | ICD-10-CM | POA: Diagnosis not present

## 2018-08-24 ENCOUNTER — Inpatient Hospital Stay: Payer: Medicare Other

## 2018-08-24 ENCOUNTER — Inpatient Hospital Stay: Payer: Medicare Other | Attending: Hematology | Admitting: Hematology

## 2018-08-24 ENCOUNTER — Telehealth: Payer: Self-pay | Admitting: Hematology

## 2018-08-24 VITALS — BP 140/50 | HR 93 | Temp 98.4°F | Resp 18 | Ht 64.0 in | Wt 183.7 lb

## 2018-08-24 DIAGNOSIS — D649 Anemia, unspecified: Secondary | ICD-10-CM

## 2018-08-24 DIAGNOSIS — D509 Iron deficiency anemia, unspecified: Secondary | ICD-10-CM | POA: Insufficient documentation

## 2018-08-24 DIAGNOSIS — E538 Deficiency of other specified B group vitamins: Secondary | ICD-10-CM

## 2018-08-24 DIAGNOSIS — C9110 Chronic lymphocytic leukemia of B-cell type not having achieved remission: Secondary | ICD-10-CM | POA: Insufficient documentation

## 2018-08-24 DIAGNOSIS — D472 Monoclonal gammopathy: Secondary | ICD-10-CM | POA: Diagnosis not present

## 2018-08-24 LAB — CBC WITH DIFFERENTIAL/PLATELET
BASOS ABS: 0 10*3/uL (ref 0.0–0.1)
Basophils Relative: 0 %
Eosinophils Absolute: 0 10*3/uL (ref 0.0–0.5)
Eosinophils Relative: 0 %
HEMATOCRIT: 29.5 % — AB (ref 36.0–46.0)
Hemoglobin: 8.9 g/dL — ABNORMAL LOW (ref 12.0–15.0)
LYMPHS ABS: 70.3 10*3/uL — AB (ref 0.7–4.0)
LYMPHS PCT: 89 %
MCH: 25.9 pg — ABNORMAL LOW (ref 26.0–34.0)
MCHC: 30.2 g/dL (ref 30.0–36.0)
MCV: 86 fL (ref 80.0–100.0)
MONO ABS: 2.4 10*3/uL — AB (ref 0.1–1.0)
Monocytes Relative: 3 %
NEUTROS ABS: 6.3 10*3/uL (ref 1.7–17.7)
NRBC: 0 % (ref 0.0–0.2)
Neutrophils Relative %: 8 %
Platelets: 304 10*3/uL (ref 150–400)
RBC: 3.43 MIL/uL — AB (ref 3.87–5.11)
RDW: 17.2 % — ABNORMAL HIGH (ref 11.5–15.5)
WBC: 79 10*3/uL — AB (ref 4.0–10.5)

## 2018-08-24 LAB — FERRITIN: FERRITIN: 152 ng/mL (ref 11–307)

## 2018-08-24 LAB — CMP (CANCER CENTER ONLY)
ALBUMIN: 3 g/dL — AB (ref 3.5–5.0)
ALT: 10 U/L (ref 0–44)
AST: 11 U/L — AB (ref 15–41)
Alkaline Phosphatase: 194 U/L — ABNORMAL HIGH (ref 38–126)
Anion gap: 12 (ref 5–15)
BUN: 18 mg/dL (ref 8–23)
CHLORIDE: 104 mmol/L (ref 98–111)
CO2: 24 mmol/L (ref 22–32)
Calcium: 9 mg/dL (ref 8.9–10.3)
Creatinine: 0.95 mg/dL (ref 0.44–1.00)
GFR, EST NON AFRICAN AMERICAN: 56 mL/min — AB (ref >60)
GFR, Est AFR Am: >60 mL/min (ref >60)
Glucose, Bld: 210 mg/dL — ABNORMAL HIGH (ref 70–99)
POTASSIUM: 4.2 mmol/L (ref 3.5–5.1)
SODIUM: 140 mmol/L (ref 135–145)
Total Bilirubin: 0.4 mg/dL (ref 0.3–1.2)
Total Protein: 7.7 g/dL (ref 6.5–8.1)

## 2018-08-24 LAB — DIRECT ANTIGLOBULIN TEST (NOT AT ARMC)
DAT, COMPLEMENT: NEGATIVE
DAT, IGG: NEGATIVE

## 2018-08-24 LAB — VITAMIN B12: Vitamin B-12: 351 pg/mL (ref 180–914)

## 2018-08-24 LAB — LACTATE DEHYDROGENASE: LDH: 352 U/L — AB (ref 98–192)

## 2018-08-24 NOTE — Telephone Encounter (Signed)
Appts scheduled avs/calendar printed per 10/22 los °

## 2018-08-24 NOTE — Progress Notes (Signed)
Marland Kitchen    HEMATOLOGY/ONCOLOGY CLINIC NOTE  Date of Service: 03/15/18  Patient Care Team: Asencion Noble, MD as PCP - General Rourk, [name], MD as Consulting Physician (Gastroenterology)  CHIEF COMPLAINTS/PURPOSE OF CONSULTATION:  F/u for CLL  HISTORY OF PRESENTING ILLNESS:  Theresa Kramer is a wonderful 77 y.o. female who has been referred to [place] by Dr Asencion Noble, MD for evaluation and management of lymphocytosis.  Patient has a h/o B12 def, depression, arthritis and DDD and was recently admitted to the hospital with TIA vs delirium in the setting of UTI. 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 She is here for further evaluation of her newly diagnosed B-cell leukemia/lymphoma. Patient reports no fevers. Has some night sweats but those have been in the setting of hypoglycemia. No overt LNadenopathy. No abdominal pain or distension. No reported weight loss. Has chronic fatigue that hasnt significantly changed recently.   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 The patient's last visit with [place] was on 05/18/18. She is accompanied today by her daughter. The pt reports that she is doing well overall.   The pt reports that her energy levels have improved and she has been more active. She denies any fevers, chills, night sweats, or significant unexpected weight loss. She has lost 5 pounds in the last 3 months. The pt does endorse continuing to eat well.   Lab results today (08/24/18) of CBC w/diff, CMP is as follows: all values are WNL except for WBC at 79.0k, RBC at 3.43, HGB at 8.9, HCT at 29.5, MCH at 25.9, RDW at 17.2, Lymphs abs at 70.3k, Monocytes abs at 2.4k, Glucose at 210, Albumin at 3.0, AST at 11, Alk Phos at 194, GFR at 56. 08/24/18 Ferritin at 152 08/24/18 LDH at 352 08/24/18 Vitamin B12 at 351  On review of  systems, pt reports good energy levels, keeping active, eating well, and denies fevers, chills, night sweats, significant unexpected weight loss, abdominal pains, leg swelling, noticing any new lumps or bumps, and any other symptoms.   MEDICAL HISTORY:  Past Medical History:  Diagnosis Date  . Arthritis   . B12 deficiency   . Bronchitis    hx of  . Cancer ([place])   . Complication of anesthesia   . Depression    "not taking medication"  . Diabetes mellitus   . GERD (gastroesophageal reflux disease)   . H/O hiatal hernia   . Hyperlipidemia   . Hypertension   . Noncompliance with medication regimen   . Pinched cervical nerve root   . PONV (postoperative nausea and vomiting)   . Stroke ([place])   . Urinary tract infection    hx of    SURGICAL HISTORY: Past Surgical History:  Procedure Laterality Date  . ABDOMINAL HYSTERECTOMY    . ANTERIOR CERVICAL DECOMP/DISCECTOMY FUSION  03/15/2012   Procedure: ANTERIOR CERVICAL DECOMPRESSION/DISCECTOMY FUSION 2 LEVELS;  Surgeon: Ophelia Charter, MD;  Location: [facility];  Service: Neurosurgery;  Laterality: N/A;  Cervical four-five Cervical five six anterior cervical decompression with fusion interbody prothesis plating and bonegraft  . APPENDECTOMY    . BIOPSY  05/18/2017   Procedure: BIOPSY;  Surgeon: Daneil Dolin, MD;  [facility];  Service: Endoscopy;;  gastric bx's  . BONE CYST EXCISION     from lower back and foot  .  CHOLECYSTECTOMY    . COLONOSCOPY  2011   Dr. Gala Romney: prep compromised exam. diverticulosis, hemorrhoids. next tcs 2021  . COLONOSCOPY WITH PROPOFOL N/A 05/18/2017   Procedure: COLONOSCOPY WITH PROPOFOL;  Surgeon: Daneil Dolin, MD;  [facility]: [facility];  Service: Endoscopy;  Laterality: N/A;  1100  . DILATION AND CURETTAGE OF UTERUS    . 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 Multiple distal rings dilated and disrupted as  described above. Small hiatal hernia.   Marland Kitchen ESOPHAGOGASTRODUODENOSCOPY (EGD) WITH PROPOFOL N/A 05/18/2017   Procedure: ESOPHAGOGASTRODUODENOSCOPY (EGD) WITH PROPOFOL;  Surgeon: Daneil Dolin, MD;  [facility];  Service: Endoscopy;  Laterality: N/A;  . EXCISION METACARPAL MASS Left 03/03/2016   Procedure: EXCISION OF LEFT  DORSAL MASS OF EXTENSOR TENDON;  Surgeon: Milly Jakob, MD;  Location: [facility];  Service: Orthopedics;  Laterality: Left;  . EYE SURGERY     bilateral cataract surgery,   . HIP ARTHROPLASTY     right  . JOINT REPLACEMENT    . KNEE SURGERY     torn cartilage repair  . MALONEY DILATION N/A 09/19/2014   Procedure: Venia Minks DILATION;  Surgeon: Daneil Dolin, MD;  [facility];  Service: Endoscopy;  Laterality: N/A;  . ORTHOPEDIC SURGERY    . POLYPECTOMY  05/18/2017   Procedure: POLYPECTOMY;  Surgeon: Daneil Dolin, MD;  [facility];  Service: Endoscopy;;  polyp at hepatic flexure, descending colon  . ROTATOR CUFF REPAIR     "multiple in both shoulders" and total shoulder replacement in left arm  . SAVORY DILATION N/A 09/19/2014   Procedure: SAVORY DILATION;  Surgeon: Daneil Dolin, MD;  [facility];  Service: Endoscopy;  Laterality: N/A;  . TONSILLECTOMY      SOCIAL HISTORY: Social History   Socioeconomic History  . Marital status: Widowed    Spouse name: Not on file  . Number of children: Not on file  . Years of education: Not on file  . Highest education level: Not on file  Occupational History  . Not on file  Social Needs  . Financial resource strain: Not on file  . Food insecurity:    Worry: Not on file    Inability: Not on file  . Transportation needs:    Medical: Not on file    Non-medical: Not on file  Tobacco Use  . Smoking status: Former Smoker    Last attempt to quit: 05/13/1974    Years since quitting: 44.3  . Smokeless tobacco: Never Used  . Tobacco comment: stopped 25 years ago    Substance and Sexual Activity  . Alcohol use: No  . Drug use: No  . Sexual activity: Not on file  Lifestyle  . Physical activity:    Days per week: Not on file    Minutes per session: Not on file  . Stress: Not on file  Relationships  . Social connections:    Talks on phone: Not on file    Gets together: Not on file    Attends religious service: Not on file    Active member of club or organization: Not on file    Attends meetings of clubs or organizations: Not on file    Relationship status: Not on file  . Intimate partner violence:    Fear of current or ex partner: Not on file    Emotionally abused: Not on file  Physically abused: Not on file    Forced sexual activity: Not on file  Other Topics Concern  . Not on file  Social History Narrative  . Not on file    FAMILY HISTORY: Family History  Problem Relation Age of Onset  . Cirrhosis Brother        etoh  . Colon cancer Neg Hx     ALLERGIES:  is allergic to meloxicam.  MEDICATIONS:  Current Outpatient Medications  Medication Sig Dispense Refill  . acetaminophen (TYLENOL ARTHRITIS PAIN) 650 MG CR tablet Take 1,300 mg by mouth 2 (two) times daily.     Marland Kitchen amLODipine (NORVASC) 5 MG tablet Take 5 mg by mouth at bedtime.     . citalopram (CELEXA) 20 MG tablet Take 20 mg by mouth daily.      . clopidogrel (PLAVIX) 75 MG tablet TAKE 1 TABLET BY MOUTH  DAILY 30 tablet 3  . clotrimazole (LOTRIMIN) 1 % cream Apply 1 application topically 2 (two) times daily as needed. For rash     . cyanocobalamin (,VITAMIN B-12,) 1000 MCG/ML injection Inject 1,000 mcg into the muscle every 30 (thirty) days. Usually on the 1st     . insulin glargine (LANTUS) 100 UNIT/ML injection Inject 45 Units into the skin at bedtime.     Marland Kitchen LORazepam (ATIVAN) 0.5 MG tablet Take 0.25 mg by mouth daily. Breakfast    . losartan-hydrochlorothiazide (HYZAAR) 100-25 MG tablet Take 1 tablet by mouth daily.    . metFORMIN (GLUCOPHAGE) 1000 MG tablet Take 500 mg  by mouth 2 (two) times daily with a meal. After lunch and bedtime    . oxybutynin (DITROPAN-XL) 10 MG 24 hr tablet Take 10 mg by mouth at bedtime.     . pantoprazole (PROTONIX) 40 MG tablet Take 40 mg by mouth daily.    . pramipexole (MIRAPEX) 0.25 MG tablet Take 0.25 mg by mouth at bedtime.     . pravastatin (PRAVACHOL) 80 MG tablet Take 1 tablet (80 mg total) by mouth daily. (Patient taking differently: Take 80 mg by mouth at bedtime. ) 30 tablet 3  . ranitidine (ZANTAC) 150 MG tablet Take 150 mg by mouth at bedtime.    . risperiDONE (RISPERDAL) 0.5 MG tablet Take 0.5 mg by mouth daily. Breakfast    . traMADol (ULTRAM) 50 MG tablet Take 1 tablet (50 mg total) by mouth every 6 (six) hours as needed for moderate pain. 20 tablet 0  . traZODone (DESYREL) 50 MG tablet Take 50 mg by mouth at bedtime.     No current facility-administered medications for this visit.     REVIEW OF SYSTEMS:    A 10+ POINT REVIEW OF SYSTEMS WAS OBTAINED including neurology, dermatology, psychiatry, cardiac, respiratory, lymph, extremities, GI, GU, Musculoskeletal, constitutional, breasts, reproductive, HEENT.  All pertinent positives are noted in the HPI.  All others are negative.   PHYSICAL EXAMINATION: ECOG PERFORMANCE STATUS: 2 - Symptomatic, <50% confined to bed  Vitals:   08/24/18 1127  BP: (!) 140/50  Pulse: 93  Resp: 18  Temp: 98.4 F (36.9 C)  SpO2: 97%   Filed Weights   08/24/18 1127  Weight: 183 lb 11.2 oz (83.3 kg)   .Body mass index is 31.53 kg/m.  GENERAL:alert, in no acute distress and comfortable SKIN: no acute rashes, no significant lesions EYES: conjunctiva are pink and non-injected, sclera anicteric OROPHARYNX: MMM, no exudates, no oropharyngeal erythema or ulceration NECK: supple, no JVD LYMPH:  no palpable lymphadenopathy in the cervical, axillary  or inguinal regions LUNGS: clear to auscultation b/l with normal respiratory effort HEART: regular rate & rhythm ABDOMEN:   normoactive bowel sounds , non tender, not distended. No palpable hepatosplenomegaly.  Extremity: no pedal edema PSYCH: alert & oriented x 3 with fluent speech NEURO: no focal motor/sensory deficits   LABORATORY DATA:  I have reviewed the data as listed  . CBC Latest Ref Rng & Units 08/24/2018 05/18/2018 03/15/2018  WBC 4.0 - 10.5 K/uL 79.0(HH) 46.8(H) 40.3(H)  Hemoglobin 12.0 - 15.0 g/dL 8.9(L) 9.6(L) 10.3(L)  Hematocrit 36.0 - 46.0 % 29.5(L) 30.6(L) 32.9(L)  Platelets 150 - 400 K/uL 304 295 313   . CBC    Component Value Date/Time   WBC 79.0 (HH) 08/24/2018 1015   RBC 3.43 (L) 08/24/2018 1015   HGB 8.9 (L) 08/24/2018 1015   HGB 10.3 (L) 03/15/2018 0919   HGB 11.2 (L) 10/14/2017 0824   HCT 29.5 (L) 08/24/2018 1015   HCT 35.0 10/14/2017 0824   PLT 304 08/24/2018 1015   PLT 313 03/15/2018 0919   PLT 238 10/14/2017 0824   MCV 86.0 08/24/2018 1015   MCV 84.5 10/14/2017 0824   MCH 25.9 (L) 08/24/2018 1015   MCHC 30.2 08/24/2018 1015   RDW 17.2 (H) 08/24/2018 1015   RDW 19.9 (H) 10/14/2017 0824   LYMPHSABS 70.3 (H) 08/24/2018 1015   LYMPHSABS 16.2 (H) 10/14/2017 0824   MONOABS 2.4 (H) 08/24/2018 1015   MONOABS 0.1 10/14/2017 0824   EOSABS 0.0 08/24/2018 1015   EOSABS 0.1 10/14/2017 0824   BASOSABS 0.0 08/24/2018 1015   BASOSABS 0.0 10/14/2017 0824     . CMP Latest Ref Rng & Units 08/24/2018 05/18/2018 03/15/2018  Glucose 70 - 99 mg/dL 210(H) 136(H) 167(H)  BUN 8 - 23 mg/dL 18 16 18   Creatinine 0.44 - 1.00 mg/dL 0.95 0.95 0.95  Sodium 135 - 145 mmol/L 140 140 139  Potassium 3.5 - 5.1 mmol/L 4.2 4.2 4.3  Chloride 98 - 111 mmol/L 104 104 104  CO2 22 - 32 mmol/L 24 26 25   Calcium 8.9 - 10.3 mg/dL 9.0 9.1 9.2  Total Protein 6.5 - 8.1 g/dL 7.7 7.4 7.8  Total Bilirubin 0.3 - 1.2 mg/dL 0.4 0.4 0.4  Alkaline Phos 38 - 126 U/L 194(H) 150(H) 162(H)  AST 15 - 41 U/L 11(L) 11(L) 13  ALT 0 - 44 U/L 10 11 14     Lab Results  Component Value Date   FERRITIN 152 08/24/2018            . Lab Results  Component Value Date   LDH 352 (H) 08/24/2018         RADIOGRAPHIC STUDIES: I have personally reviewed the radiological images as listed and agreed with the findings in the report. No results found.  PET/CT 10/25/017:  IMPRESSION: 1. Mildly enlarged and mildly hypermetabolic spleen, consistent with the provided history of a lymphoproliferative disorder. 2. No hypermetabolic lymphadenopathy or other hypermetabolic sites of disease. 3. Additional findings include aortic atherosclerosis, three-vessel coronary atherosclerosis, small hiatal hernia and small fat containing left inguinal hernia.   Electronically Signed   By: Ilona Sorrel M.D.   On: 08/26/2016 10:09     ASSESSMENT & PLAN:   77 y.o. 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 Noted to have elevated wbc/lymphocyte counts end of September. Unknown if she had a more chronic elevation of her WBC counts that might put a timeline on  her lymphocytosis. -Differential diagnosis- CD5 neg variant of CLL (about 17-20% patients with CLL could be CD5neg) vs B-cell leukemia/lymphoma -08/26/16 PET/CT with no significant LNadenopathy and PET/CT with minimal enlarged and minimally hypermetabolic. FISH shows Trisomy 38 which would be consistent with CLL Features consistent with indolent chronic lymphoproliferative process - likely CLL   PLAN:  -Discussed pt labwork today, 08/24/18; Lymphocytes abs increased over 3 months form 41.6k to 70.3k, some worsened anemia with HGB to 8.9. LDH at 352. Ferritin normal at 152.  -Will order [place] and Coomb's test today for further evaluation of elevated LDH (Done - haptoglobin not decreased,coombs neg) -Discussed that the speed of lymphocyte increase, and the worsened anemia are considerations to initiate treatment soon and in the near future  -Discussed the treatment  options of Rituxan vs Bendamustine and Rituxan likely between 4-6 cycles vs Venetoclax  -The pt will consider these options and prefers to return to care in one month -Advised that pt let me know if she develops any fevers, chills, night sweats, or other concerns in the interim -Will see the pt back in one month   2) Anemia - microcytic likely related to iron deficiency previously .  Plan -continue Polysaccharide iron 161m po BID. She also has been taking Protonix3 -HGB down to 8.9 -No indication for IV iron at this time . 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 Brings up the possibility of lymphoplasmacytic lymphoma however trisomy 12 is typical for CLL and uncommon in LPL/WM M spike 0.4g/dl unchanged  4). Patient Active Problem List   Diagnosis Date Noted  . History of stroke 08/04/2017  . IDA (iron deficiency anemia) 03/25/2017  . Somnolence, daytime 12/31/2016  . Snoring 12/31/2016  . Occipital neuralgia   . Acute ischemic stroke ([place]   . Stroke ([place]   . 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 Leukocytosis 08/01/2016  . Urinary tract infectious disease 08/01/2016  . Type 2 diabetes mellitus without complication, with long-term current use of insulin ([place] 07/30/2016  . Essential hypertension 07/30/2016  . Osteoarthritis 07/30/2016  . Normocytic anemia 07/30/2016  . HLD (hyperlipidemia)   . Schatzki's ring   . Cervical herniated disc 03/15/2012  . GERD 04/29/2010  . CHEST PAIN, ATYPICAL 04/29/2010  . DYSPHAGIA UNSPECIFIED 04/29/2010  -continue rf/u with PCP.   Additional labs today RTC with Dr KIrene Limbowith labs in 4 weeks    All of the patients questions were answered with apparent satisfaction. The patient knows to call the clinic with  any problems, questions or concerns.   The total time spent in the appt was 30 minutes and more than 50% was on counseling and direct patient cares.    GSullivan LoneMD MS AAHIVMS SE[facility] Recovery CenterCSan Antonio Dig[facility]oscopy Center IncHematology/Oncology Physician CCoastal Harbor Treatment Center (Office):       [phone](Work cell):  [phone](Fax):           [phone] I, SBaldwin Jamaica am acting as a scribe for Dr. KIrene Limbo .I have reviewed the above documentation for accuracy and completeness, and I agree with the above. .Brunetta GeneraMD

## 2018-08-25 LAB — HAPTOGLOBIN: HAPTOGLOBIN: 389 mg/dL — AB (ref 34–200)

## 2018-09-05 ENCOUNTER — Encounter: Payer: Self-pay | Admitting: Hematology

## 2018-09-21 ENCOUNTER — Other Ambulatory Visit: Payer: Self-pay | Admitting: *Deleted

## 2018-09-21 DIAGNOSIS — C9110 Chronic lymphocytic leukemia of B-cell type not having achieved remission: Secondary | ICD-10-CM

## 2018-09-21 NOTE — Progress Notes (Signed)
.    HEMATOLOGY/ONCOLOGY CLINIC NOTE  Date of Service: 03/15/18  Patient Care Team: Fagan, Roy, MD as PCP - General Rourk, Robert M, MD as Consulting Physician (Gastroenterology)  CHIEF COMPLAINTS/PURPOSE OF CONSULTATION:  F/u for CLL  HISTORY OF PRESENTING ILLNESS:  Theresa Kramer is a wonderful 77 y.o. female who has been referred to us by Dr Fagan, Roy, MD for evaluation and management of lymphocytosis.  Patient has a h/o B12 def, depression, arthritis and DDD and was recently admitted to the hospital with TIA vs delirium in the setting of UTI. 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 She is here for further evaluation of her newly diagnosed B-cell leukemia/lymphoma. Patient reports no fevers. Has some night sweats but those have been in the setting of hypoglycemia. No overt LNadenopathy. No abdominal pain or distension. No reported weight loss. Has chronic fatigue that hasnt significantly changed recently.   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 The patient's last visit with us was on 08/24/18. She is accompanied today by her daughter. The pt reports that she is doing well overall.   The pt reports that she has been feeling like her normal self in the interim. She has not developed any fevers, chills, night sweats, or unexpected weight loss. She has not noticed any new lumps or bumps.   Lab results today (09/22/18) of CBC w/diff, CMP is as follows: all values are WNL except for WBC at 86.1k, RBC at 3.47, HGB at 8.8, HCT at 29.9, MCH at 25.4, MCHC at 29.4, RDW at 16.6, Lymphs abs at 71.5k, Monocytes abs at 7.7k, Glucose at 123, Albumin at 3.1, AST at 12, Alk Phos at 182. 09/22/18 Ferritin at 148 09/22/18 LDH at 418 09/22/18 Vitamin B12 and Haptoglobin are pending  On review of systems, pt reports stable  energy levels, eating well, stable weight, and denies fevers, chills, night sweats, unexpected weight loss, abdominal pains, any pain in general, and any other symptoms.   MEDICAL HISTORY:  Past Medical History:  Diagnosis Date  . Arthritis   . B12 deficiency   . Bronchitis    hx of  . Cancer (HCC)   . Complication of anesthesia   . Depression    "not taking medication"  . Diabetes mellitus   . GERD (gastroesophageal reflux disease)   . H/O hiatal hernia   . Hyperlipidemia   . Hypertension   . Noncompliance with medication regimen   . Pinched cervical nerve root   . PONV (postoperative nausea and vomiting)   . Stroke (HCC)   . Urinary tract infection    hx of    SURGICAL HISTORY: Past Surgical History:  Procedure Laterality Date  . ABDOMINAL HYSTERECTOMY    . ANTERIOR CERVICAL DECOMP/DISCECTOMY FUSION  03/15/2012   Procedure: ANTERIOR CERVICAL DECOMPRESSION/DISCECTOMY FUSION 2 LEVELS;  Surgeon: Jeffrey D Jenkins, MD;  Location: [facility];  Service: Neurosurgery;  Laterality: N/A;  Cervical four-five Cervical five six anterior cervical decompression with fusion interbody prothesis plating and bonegraft  . APPENDECTOMY    . BIOPSY  05/18/2017   Procedure: BIOPSY;  Surgeon: Rourk, Robert M, MD;  Location: [facility];  Service: Endoscopy;;  gastric bx's  . BONE CYST EXCISION     from lower back and foot  . CHOLECYSTECTOMY    . COLONOSCOPY  2011     Dr. Rourk: prep compromised exam. diverticulosis, hemorrhoids. next tcs 2021  . COLONOSCOPY WITH PROPOFOL N/A 05/18/2017   Procedure: COLONOSCOPY WITH PROPOFOL;  Surgeon: Rourk, Robert M, MD;  Location: [facility];  Service: Endoscopy;  Laterality: N/A;  1100  . DILATION AND CURETTAGE OF UTERUS    . 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 Multiple distal rings dilated and disrupted as described above. Small hiatal hernia.   . ESOPHAGOGASTRODUODENOSCOPY  (EGD) WITH PROPOFOL N/A 05/18/2017   Procedure: ESOPHAGOGASTRODUODENOSCOPY (EGD) WITH PROPOFOL;  Surgeon: Rourk, Robert M, MD;  Location: [facility];  Service: Endoscopy;  Laterality: N/A;  . EXCISION METACARPAL MASS Left 03/03/2016   Procedure: EXCISION OF LEFT  DORSAL MASS OF EXTENSOR TENDON;  Surgeon: David Thompson, MD;  Location: [facility];  Service: Orthopedics;  Laterality: Left;  . EYE SURGERY     bilateral cataract surgery,   . HIP ARTHROPLASTY     right  . JOINT REPLACEMENT    . KNEE SURGERY     torn cartilage repair  . MALONEY DILATION N/A 09/19/2014   Procedure: MALONEY DILATION;  Surgeon: Robert M Rourk, MD;  Location: [facility];  Service: Endoscopy;  Laterality: N/A;  . ORTHOPEDIC SURGERY    . POLYPECTOMY  05/18/2017   Procedure: POLYPECTOMY;  Surgeon: Rourk, Robert M, MD;  Location: [facility];  Service: Endoscopy;;  polyp at hepatic flexure, descending colon  . ROTATOR CUFF REPAIR     "multiple in both shoulders" and total shoulder replacement in left arm  . SAVORY DILATION N/A 09/19/2014   Procedure: SAVORY DILATION;  Surgeon: Robert M Rourk, MD;  Location: [facility];  Service: Endoscopy;  Laterality: N/A;  . TONSILLECTOMY      SOCIAL HISTORY: Social History   Socioeconomic History  . Marital status: Widowed    Spouse name: Not on file  . Number of children: Not on file  . Years of education: Not on file  . Highest education level: Not on file  Occupational History  . Not on file  Social Needs  . Financial resource strain: Not on file  . Food insecurity:    Worry: Not on file    Inability: Not on file  . Transportation needs:    Medical: Not on file    Non-medical: Not on file  Tobacco Use  . Smoking status: Former Smoker    Last attempt to quit: 05/13/1974    Years since quitting: 44.3  . Smokeless tobacco: Never Used  . Tobacco comment: stopped 25 years ago  Substance and Sexual Activity  . Alcohol use: No  . Drug use: No    . Sexual activity: Not on file  Lifestyle  . Physical activity:    Days per week: Not on file    Minutes per session: Not on file  . Stress: Not on file  Relationships  . Social connections:    Talks on phone: Not on file    Gets together: Not on file    Attends religious service: Not on file    Active member of club or organization: Not on file    Attends meetings of clubs or organizations: Not on file    Relationship status: Not on file  . Intimate partner violence:    Fear of current or ex partner: Not on file    Emotionally abused: Not on file    Physically abused: Not on file    Forced sexual   activity: Not on file  Other Topics Concern  . Not on file  Social History Narrative  . Not on file    FAMILY HISTORY: Family History  Problem Relation Age of Onset  . Cirrhosis Brother        etoh  . Colon cancer Neg Hx     ALLERGIES:  is allergic to meloxicam.  MEDICATIONS:  Current Outpatient Medications  Medication Sig Dispense Refill  . acetaminophen (TYLENOL ARTHRITIS PAIN) 650 MG CR tablet Take 1,300 mg by mouth 2 (two) times daily.     Marland Kitchen amLODipine (NORVASC) 5 MG tablet Take 5 mg by mouth at bedtime.     . citalopram (CELEXA) 20 MG tablet Take 20 mg by mouth daily.      . clopidogrel (PLAVIX) 75 MG tablet TAKE 1 TABLET BY MOUTH  DAILY 30 tablet 3  . clotrimazole (LOTRIMIN) 1 % cream Apply 1 application topically 2 (two) times daily as needed. For rash     . cyanocobalamin (,VITAMIN B-12,) 1000 MCG/ML injection Inject 1,000 mcg into the muscle every 30 (thirty) days. Usually on the 1st     . insulin glargine (LANTUS) 100 UNIT/ML injection Inject 45 Units into the skin at bedtime.     Marland Kitchen LORazepam (ATIVAN) 0.5 MG tablet Take 0.25 mg by mouth daily. Breakfast    . losartan-hydrochlorothiazide (HYZAAR) 100-25 MG tablet Take 1 tablet by mouth daily.    . metFORMIN (GLUCOPHAGE) 1000 MG tablet Take 500 mg by mouth 2 (two) times daily with a meal. After lunch and bedtime     . oxybutynin (DITROPAN-XL) 10 MG 24 hr tablet Take 10 mg by mouth at bedtime.     . pantoprazole (PROTONIX) 40 MG tablet Take 40 mg by mouth daily.    . pramipexole (MIRAPEX) 0.25 MG tablet Take 0.25 mg by mouth at bedtime.     . pravastatin (PRAVACHOL) 80 MG tablet Take 1 tablet (80 mg total) by mouth daily. (Patient taking differently: Take 80 mg by mouth at bedtime. ) 30 tablet 3  . ranitidine (ZANTAC) 150 MG tablet Take 150 mg by mouth at bedtime.    . risperiDONE (RISPERDAL) 0.5 MG tablet Take 0.5 mg by mouth daily. Breakfast    . traMADol (ULTRAM) 50 MG tablet Take 1 tablet (50 mg total) by mouth every 6 (six) hours as needed for moderate pain. 20 tablet 0  . traZODone (DESYREL) 50 MG tablet Take 50 mg by mouth at bedtime.     No current facility-administered medications for this visit.     REVIEW OF SYSTEMS:    A 10+ POINT REVIEW OF SYSTEMS WAS OBTAINED including neurology, dermatology, psychiatry, cardiac, respiratory, lymph, extremities, GI, GU, Musculoskeletal, constitutional, breasts, reproductive, HEENT.  All pertinent positives are noted in the HPI.  All others are negative.   PHYSICAL EXAMINATION: ECOG PERFORMANCE STATUS: 2 - Symptomatic, <50% confined to bed  Vitals:   09/22/18 1159  BP: 109/69  Pulse: 100  Resp: 18  Temp: 98.8 F (37.1 C)  SpO2: 97%   Filed Weights   09/22/18 1159  Weight: 185 lb (83.9 kg)   .Body mass index is 31.76 kg/m.  GENERAL:alert, in no acute distress and comfortable SKIN: no acute rashes, no significant lesions EYES: conjunctiva are pink and non-injected, sclera anicteric OROPHARYNX: MMM, no exudates, no oropharyngeal erythema or ulceration NECK: supple, no JVD LYMPH:  no palpable lymphadenopathy in the cervical, axillary or inguinal regions LUNGS: clear to auscultation b/l with normal respiratory effort HEART:  regular rate & rhythm ABDOMEN:  normoactive bowel sounds , non tender, not distended. No palpable hepatosplenomegaly.   Extremity: no pedal edema PSYCH: alert & oriented x 3 with fluent speech NEURO: no focal motor/sensory deficits   LABORATORY DATA:  I have reviewed the data as listed  . CBC Latest Ref Rng & Units 09/22/2018 08/24/2018 05/18/2018  WBC 4.0 - 10.5 K/uL 86.1(HH) 79.0(HH) 46.8(H)  Hemoglobin 12.0 - 15.0 g/dL 8.8(L) 8.9(L) 9.6(L)  Hematocrit 36.0 - 46.0 % 29.9(L) 29.5(L) 30.6(L)  Platelets 150 - 400 K/uL 290 304 295   . CBC    Component Value Date/Time   WBC 86.1 (HH) 09/22/2018 1124   WBC 79.0 (HH) 08/24/2018 1015   RBC 3.47 (L) 09/22/2018 1124   HGB 8.8 (L) 09/22/2018 1124   HGB 11.2 (L) 10/14/2017 0824   HCT 29.9 (L) 09/22/2018 1124   HCT 35.0 10/14/2017 0824   PLT 290 09/22/2018 1124   PLT 238 10/14/2017 0824   MCV 86.2 09/22/2018 1124   MCV 84.5 10/14/2017 0824   MCH 25.4 (L) 09/22/2018 1124   MCHC 29.4 (L) 09/22/2018 1124   RDW 16.6 (H) 09/22/2018 1124   RDW 19.9 (H) 10/14/2017 0824   LYMPHSABS 71.5 (H) 09/22/2018 1124   LYMPHSABS 16.2 (H) 10/14/2017 0824   MONOABS 7.7 (H) 09/22/2018 1124   MONOABS 0.1 10/14/2017 0824   EOSABS 0.0 09/22/2018 1124   EOSABS 0.1 10/14/2017 0824   BASOSABS 0.0 09/22/2018 1124   BASOSABS 0.0 10/14/2017 0824     . CMP Latest Ref Rng & Units 09/22/2018 08/24/2018 05/18/2018  Glucose 70 - 99 mg/dL 123(H) 210(H) 136(H)  BUN 8 - 23 mg/dL 18 18 16  Creatinine 0.44 - 1.00 mg/dL 0.83 0.95 0.95  Sodium 135 - 145 mmol/L 139 140 140  Potassium 3.5 - 5.1 mmol/L 4.4 4.2 4.2  Chloride 98 - 111 mmol/L 104 104 104  CO2 22 - 32 mmol/L 26 24 26  Calcium 8.9 - 10.3 mg/dL 9.0 9.0 9.1  Total Protein 6.5 - 8.1 g/dL 7.7 7.7 7.4  Total Bilirubin 0.3 - 1.2 mg/dL 0.4 0.4 0.4  Alkaline Phos 38 - 126 U/L 182(H) 194(H) 150(H)  AST 15 - 41 U/L 12(L) 11(L) 11(L)  ALT 0 - 44 U/L 6 10 11    Lab Results  Component Value Date   FERRITIN 148 09/22/2018           . Lab Results  Component Value Date   LDH 418 (H) 09/22/2018          RADIOGRAPHIC STUDIES: I have personally reviewed the radiological images as listed and agreed with the findings in the report. No results found.  PET/CT 10/25/017:  IMPRESSION: 1. Mildly enlarged and mildly hypermetabolic spleen, consistent with the provided history of a lymphoproliferative disorder. 2. No hypermetabolic lymphadenopathy or other hypermetabolic sites of disease. 3. Additional findings include aortic atherosclerosis, three-vessel coronary atherosclerosis, small hiatal hernia and small fat containing left inguinal hernia.   Electronically Signed   By: Jason A Poff M.D.   On: 08/26/2016 10:09     ASSESSMENT & PLAN:   77 y.o. 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 Noted to have elevated wbc/lymphocyte counts end of September. Unknown if she had a more chronic elevation of her WBC counts that might put a timeline on her lymphocytosis. -Differential diagnosis- CD5 neg variant of CLL (about 17-20% patients with CLL could be CD5neg) vs   B-cell leukemia/lymphoma -08/26/16 PET/CT with no significant LNadenopathy and PET/CT with minimal enlarged and minimally hypermetabolic. FISH shows Trisomy 12 which would be consistent with CLL Features consistent with indolent chronic lymphoproliferative process - likely CLL   PLAN: -Will order H[place] and Coomb's test today for further evaluation of elevated LDH (Done - haptoglobin not decreased,coombs neg) -Discussed pt labwork today, 09/22/18; WBC increased from 79k to 86k over 4 weeks, HGB stable at 8.8, PLT normal. Blood chemistries are stable. LDH increased to 418. Ferritin normal at 148 -Pt continues to not have any constitutional symptoms -Anemia as primary factor for considering initiating treatment -Discussed the possible treatment of Rituxan vs Bendamustine and Rituxan vs Venetoclax  -The pt would prefer to decide  which treatment to pursue until our next visit -Will order CT C/A/P -Will see the pt back in 3 weeks   2) Anemia - microcytic likely related to iron deficiency previously .  Plan -continue Polysaccharide iron 150mg po BID. She also has been taking Protonix3 -HGB down to 8.9 -No indication for IV iron at this time . 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 Brings up the possibility of lymphoplasmacytic lymphoma however trisomy 12 is typical for CLL and uncommon in LPL/WM M spike 0.4g/dl unchanged  4). Patient Active Problem List   Diagnosis Date Noted  . History of stroke 08/04/2017  . IDA (iron deficiency anemia) 03/25/2017  . Somnolence, daytime 12/31/2016  . Snoring 12/31/2016  . Occipital neuralgia   . Acute ischemic stroke (HCC)   . Stroke (HCC)   . 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 Leukocytosis 08/01/2016  . Urinary tract infectious disease 08/01/2016  . Type 2 diabetes mellitus without complication, with long-term current use of insulin (HCC) 07/30/2016  . Essential hypertension 07/30/2016  . Osteoarthritis 07/30/2016  . Normocytic anemia 07/30/2016  . HLD (hyperlipidemia)   . Schatzki's ring   . Cervical herniated disc 03/15/2012  . GERD 04/29/2010  . CHEST PAIN, ATYPICAL 04/29/2010  . DYSPHAGIA UNSPECIFIED 04/29/2010  -continue rf/u with PCP.   CT chest/abd/pelvis in 2 weeks RTC with Dr  in 3 weeks with labs    All of the patients questions were answered with apparent satisfaction. The patient knows to call the clinic with any problems, questions or concerns.   The total time spent in the appt was 30 minutes and more than 50% was on counseling and direct patient cares.     MD MS AAHIVMS SCH  [facility] Hematology/Oncology Physician [facility]  (Office):       [phone] (Work cell):  [phone] (Fax):           [phone]  I, Schuyler Bain, am acting as a scribe for Dr.  .   .I have reviewed the above documentation for accuracy and completeness, and I agree with the above. . Kishore  MD   

## 2018-09-22 ENCOUNTER — Inpatient Hospital Stay: Payer: Medicare Other | Attending: Hematology | Admitting: Hematology

## 2018-09-22 ENCOUNTER — Telehealth: Payer: Self-pay | Admitting: Hematology

## 2018-09-22 ENCOUNTER — Encounter: Payer: Self-pay | Admitting: Hematology

## 2018-09-22 ENCOUNTER — Inpatient Hospital Stay: Payer: Medicare Other

## 2018-09-22 VITALS — BP 109/69 | HR 100 | Temp 98.8°F | Resp 18 | Ht 64.0 in | Wt 185.0 lb

## 2018-09-22 DIAGNOSIS — C9110 Chronic lymphocytic leukemia of B-cell type not having achieved remission: Secondary | ICD-10-CM

## 2018-09-22 DIAGNOSIS — D649 Anemia, unspecified: Secondary | ICD-10-CM

## 2018-09-22 DIAGNOSIS — D509 Iron deficiency anemia, unspecified: Secondary | ICD-10-CM | POA: Diagnosis not present

## 2018-09-22 LAB — CMP (CANCER CENTER ONLY)
ALK PHOS: 182 U/L — AB (ref 38–126)
ALT: 6 U/L (ref 0–44)
ANION GAP: 9 (ref 5–15)
AST: 12 U/L — ABNORMAL LOW (ref 15–41)
Albumin: 3.1 g/dL — ABNORMAL LOW (ref 3.5–5.0)
BUN: 18 mg/dL (ref 8–23)
CHLORIDE: 104 mmol/L (ref 98–111)
CO2: 26 mmol/L (ref 22–32)
Calcium: 9 mg/dL (ref 8.9–10.3)
Creatinine: 0.83 mg/dL (ref 0.44–1.00)
GFR, Estimated: >60 mL/min (ref >60)
Glucose, Bld: 123 mg/dL — ABNORMAL HIGH (ref 70–99)
POTASSIUM: 4.4 mmol/L (ref 3.5–5.1)
SODIUM: 139 mmol/L (ref 135–145)
Total Bilirubin: 0.4 mg/dL (ref 0.3–1.2)
Total Protein: 7.7 g/dL (ref 6.5–8.1)

## 2018-09-22 LAB — CBC WITH DIFFERENTIAL (CANCER CENTER ONLY)
Abs Immature Granulocytes: 0 10*3/uL (ref 0.00–0.07)
BASOS PCT: 0 %
Basophils Absolute: 0 10*3/uL (ref 0.0–0.1)
EOS PCT: 0 %
Eosinophils Absolute: 0 10*3/uL (ref 0.0–0.5)
HCT: 29.9 % — ABNORMAL LOW (ref 36.0–46.0)
HEMOGLOBIN: 8.8 g/dL — AB (ref 12.0–15.0)
LYMPHS PCT: 83 %
Lymphs Abs: 71.5 10*3/uL — ABNORMAL HIGH (ref 0.7–4.0)
MCH: 25.4 pg — AB (ref 26.0–34.0)
MCHC: 29.4 g/dL — AB (ref 30.0–36.0)
MCV: 86.2 fL (ref 80.0–100.0)
MONO ABS: 7.7 10*3/uL — AB (ref 0.1–1.0)
Monocytes Relative: 9 %
NEUTROS ABS: 6.9 10*3/uL (ref 1.7–17.7)
Neutrophils Relative %: 8 %
PLATELETS: 290 10*3/uL (ref 150–400)
RBC: 3.47 MIL/uL — AB (ref 3.87–5.11)
RDW: 16.6 % — ABNORMAL HIGH (ref 11.5–15.5)
WBC: 86.1 10*3/uL — AB (ref 4.0–10.5)
nRBC: 0 % (ref 0.0–0.2)

## 2018-09-22 LAB — VITAMIN B12: Vitamin B-12: 286 pg/mL (ref 180–914)

## 2018-09-22 LAB — FERRITIN: Ferritin: 148 ng/mL (ref 11–307)

## 2018-09-22 LAB — LACTATE DEHYDROGENASE: LDH: 418 U/L — AB (ref 98–192)

## 2018-09-22 NOTE — Telephone Encounter (Signed)
Gave pt avs and calendar  °

## 2018-09-22 NOTE — Patient Instructions (Signed)
Thank you for choosing [facility] to provide your oncology and hematology care.  To afford each patient quality time with our providers, please arrive 30 minutes before your scheduled appointment time.  If you arrive late for your appointment, you may be asked to reschedule.  We strive to give you quality time with our providers, and arriving late affects you and other patients whose appointments are after yours.    If you are a no show for multiple scheduled visits, you may be dismissed from the clinic at the providers discretion.     Again, thank you for choosing [facility], our hope is that these requests will decrease the amount of time that you wait before being seen by our physicians.  ______________________________________________________________________   Should you have questions after your visit to the [facility], please contact our office at [phone] between the hours of 8:30 and 4:30 p.m.    Voicemails left after 4:30p.m will not be returned until the following business day.     For prescription refill requests, please have your pharmacy contact us directly.  Please also try to allow 48 hours for prescription requests.     Please contact the scheduling department for questions regarding scheduling.  For scheduling of procedures such as PET scans, CT scans, MRI, Ultrasound, etc please contact central scheduling at [phone].     Resources For Cancer Patients and Caregivers:    Oncolink.org:  A wonderful resource for patients and healthcare providers for information regarding your disease, ways to tract your treatment, what to expect, etc.      American Cancer Society:  800-227-2345  Can help patients locate various types of support and financial assistance   Cancer Care: 1-800-813-HOPE (4673) Provides financial assistance, online support groups, medication/co-pay assistance.     [place] DSS:  336-641-3447 Where to apply  for food stamps, Medicaid, and utility assistance   Medicare Rights Center: 800-333-4114 Helps people with Medicare understand their rights and benefits, navigate the Medicare system, and secure the quality healthcare they deserve   SCAT: 336-333-6589 [employer] [employer]'s shared-ride transportation service for eligible riders who have a disability that prevents them from riding the fixed route bus.     For additional information on assistance programs please contact our social worker:   Theresa Kramer:  [phone]  

## 2018-09-23 LAB — HAPTOGLOBIN: HAPTOGLOBIN: 379 mg/dL — AB (ref 34–200)

## 2018-10-04 ENCOUNTER — Telehealth: Payer: Self-pay | Admitting: *Deleted

## 2018-10-04 NOTE — Telephone Encounter (Signed)
Daughter Leonette Monarch called (POA and healthcare POA). Her # [phone].  She states Dr. Irene Limbo instructed her to call the office once the decision about which chemo was made. She states the decision was made to use Venetoclax since it is oral treatment. With her mother's anxiety, short term memory problems and dementia, she does not want to subject her to long infusions. Advised her that MD will be informed and she will contacted about next steps.

## 2018-10-06 ENCOUNTER — Ambulatory Visit (HOSPITAL_COMMUNITY)
Admission: RE | Admit: 2018-10-06 | Discharge: 2018-10-06 | Disposition: A | Discharge status: Home / Self Care | Payer: Medicare Other | Source: Ambulatory Visit | Attending: Hematology | Admitting: Hematology

## 2018-10-06 ENCOUNTER — Encounter (HOSPITAL_COMMUNITY): Payer: Self-pay

## 2018-10-06 DIAGNOSIS — D649 Anemia, unspecified: Secondary | ICD-10-CM | POA: Diagnosis not present

## 2018-10-06 DIAGNOSIS — C9110 Chronic lymphocytic leukemia of B-cell type not having achieved remission: Secondary | ICD-10-CM | POA: Diagnosis not present

## 2018-10-06 MED ORDER — IOHEXOL 300 MG/ML  SOLN
100.0000 mL | Freq: Once | INTRAMUSCULAR | Status: AC | PRN
  Administered 2018-10-06: 100 mL via INTRAVENOUS

## 2018-10-06 MED ORDER — IOHEXOL 300 MG/ML  SOLN
30.0000 mL | Freq: Once | INTRAMUSCULAR | Status: AC | PRN
  Administered 2018-10-06: 30 mL via ORAL

## 2018-10-06 MED ORDER — SODIUM CHLORIDE (PF) 0.9 % IJ SOLN
INTRAMUSCULAR | Status: AC
  Filled 2018-10-06: qty 50

## 2018-10-07 DIAGNOSIS — E1129 Type 2 diabetes mellitus with other diabetic kidney complication: Secondary | ICD-10-CM | POA: Diagnosis not present

## 2018-10-11 NOTE — Progress Notes (Signed)
Marland Kitchen    HEMATOLOGY/ONCOLOGY CLINIC NOTE  Date of Service: .10/12/2018   Patient Care Team: Asencion Noble, MD as PCP - General Rourk, [name], MD as Consulting Physician (Gastroenterology)  CHIEF COMPLAINTS/PURPOSE OF CONSULTATION:  F/u for CLL  HISTORY OF PRESENTING ILLNESS:  Theresa Kramer is a wonderful 77 y.o. female who has been referred to [place] by Dr Asencion Noble, MD for evaluation and management of lymphocytosis.  Patient has a h/o B12 def, depression, arthritis and DDD and was recently admitted to the hospital with TIA vs delirium in the setting of UTI. 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 She is here for further evaluation of her newly diagnosed B-cell leukemia/lymphoma. Patient reports no fevers. Has some night sweats but those have been in the setting of hypoglycemia. No overt LNadenopathy. No abdominal pain or distension. No reported weight loss. Has chronic fatigue that hasnt significantly changed recently.   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 The patient's last visit with [place] was on 09/22/18. She is accompanied today by her daughter. The pt reports that she is doing well overall.   The pt reports that she has not developed any new concerns in the interim, but continues to feel somewhat tired. She has not developed any fevers, chills, or night sweats.   In the interim she has considered the treatment options we discussed at last visit, and in keeping with her goals of care, she would like to begin treatment with Venetoclax as opposed to a combination of Rituxan with or without Bendamustine.   Of note since the patient's last visit, pt has had a CT C/A/P completed on 10/06/18 with results revealing Marked splenomegaly, significantly increased in size since 2017 PET-CT. 2. No lymphadenopathy or other sites  of lymphoproliferative disease. 3. Three-vessel coronary atherosclerosis. 4.  Aortic Atherosclerosis.  Lab results today (10/12/18) of CBC w/diff and Reticulocytes is as follows: all values are WNL except for WBC at 78.1k, RBC at 3.52, HGB at 8.9, HCT at 30.1, MCH at 25.3, MCHC at 29.6, RDW at 16.5, Lymphs abs at 70.3k, Monocytes abs at 3.9k, Immature Retic fract at 22.7%. 10/12/18 CMP is pending  10/12/18 LDH is pending  On review of systems, pt reports feeling tired, eating well, and denies fevers, chill, night sweats, abdominal pains, and any other symptoms.   MEDICAL HISTORY:  Past Medical History:  Diagnosis Date  . Arthritis   . B12 deficiency   . Bronchitis    hx of  . Cancer ([place])   . Complication of anesthesia   . Depression    "not taking medication"  . Diabetes mellitus   . GERD (gastroesophageal reflux disease)   . H/O hiatal hernia   . Hyperlipidemia   . Hypertension   . Noncompliance with medication regimen   . Pinched cervical nerve root   . PONV (postoperative nausea and vomiting)   . Stroke ([place])   . Urinary tract infection    hx of    SURGICAL HISTORY: Past Surgical History:  Procedure Laterality Date  . ABDOMINAL HYSTERECTOMY    . ANTERIOR CERVICAL DECOMP/DISCECTOMY FUSION  03/15/2012   Procedure: ANTERIOR CERVICAL DECOMPRESSION/DISCECTOMY FUSION 2 LEVELS;  Surgeon: Ophelia Charter, MD;  Location: [facility];  Service: Neurosurgery;  Laterality: N/A;  Cervical four-five Cervical five six anterior cervical decompression with fusion interbody prothesis  plating and bonegraft  . APPENDECTOMY    . BIOPSY  05/18/2017   Procedure: BIOPSY;  Surgeon: Daneil Dolin, MD;  [facility];  Service: Endoscopy;;  gastric bx's  . BONE CYST EXCISION     from lower back and foot  . CHOLECYSTECTOMY    . COLONOSCOPY  2011   Dr. Gala Romney: prep compromised exam. diverticulosis, hemorrhoids. next tcs 2021  . COLONOSCOPY WITH PROPOFOL N/A 05/18/2017   Procedure:  COLONOSCOPY WITH PROPOFOL;  Surgeon: Daneil Dolin, MD;  [facility];  Service: Endoscopy;  Laterality: N/A;  1100  . DILATION AND CURETTAGE OF UTERUS    . 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 Multiple distal rings dilated and disrupted as described above. Small hiatal hernia.   Marland Kitchen ESOPHAGOGASTRODUODENOSCOPY (EGD) WITH PROPOFOL N/A 05/18/2017   Procedure: ESOPHAGOGASTRODUODENOSCOPY (EGD) WITH PROPOFOL;  Surgeon: Daneil Dolin, MD;  [facility];  Service: Endoscopy;  Laterality: N/A;  . EXCISION METACARPAL MASS Left 03/03/2016   Procedure: EXCISION OF LEFT  DORSAL MASS OF EXTENSOR TENDON;  Surgeon: Milly Jakob, MD;  Location: [facility];  Service: Orthopedics;  Laterality: Left;  . EYE SURGERY     bilateral cataract surgery,   . HIP ARTHROPLASTY     right  . JOINT REPLACEMENT    . KNEE SURGERY     torn cartilage repair  . MALONEY DILATION N/A 09/19/2014   Procedure: Venia Minks DILATION;  Surgeon: Daneil Dolin, MD;  [facility];  Service: Endoscopy;  Laterality: N/A;  . ORTHOPEDIC SURGERY    . POLYPECTOMY  05/18/2017   Procedure: POLYPECTOMY;  Surgeon: Daneil Dolin, MD;  [facility];  Service: Endoscopy;;  polyp at hepatic flexure, descending colon  . ROTATOR CUFF REPAIR     "multiple in both shoulders" and total shoulder replacement in left arm  . SAVORY DILATION N/A 09/19/2014   Procedure: SAVORY DILATION;  Surgeon: Daneil Dolin, MD;  [facility];  Service: Endoscopy;  Laterality: N/A;  . TONSILLECTOMY      SOCIAL HISTORY: Social History   Socioeconomic History  . Marital status: Widowed    Spouse name: Not on file  . Number of children: Not on file  . Years of education: Not on file  . Highest education level: Not on file  Occupational History  . Not on file  Social Needs  . Financial resource strain: Not on file  . Food  insecurity:    Worry: Not on file    Inability: Not on file  . Transportation needs:    Medical: Not on file    Non-medical: Not on file  Tobacco Use  . Smoking status: Former Smoker    Last attempt to quit: 05/13/1974    Years since quitting: 44.4  . Smokeless tobacco: Never Used  . Tobacco comment: stopped 25 years ago  Substance and Sexual Activity  . Alcohol use: No  . Drug use: No  . Sexual activity: Not on file  Lifestyle  . Physical activity:    Days per week: Not on file    Minutes per session: Not on file  . Stress: Not on file  Relationships  . Social connections:    Talks on phone: Not on file    Gets together: Not on file    Attends religious service: Not on file    Active member of club or organization: Not on  file    Attends meetings of clubs or organizations: Not on file    Relationship status: Not on file  . Intimate partner violence:    Fear of current or ex partner: Not on file    Emotionally abused: Not on file    Physically abused: Not on file    Forced sexual activity: Not on file  Other Topics Concern  . Not on file  Social History Narrative  . Not on file    FAMILY HISTORY: Family History  Problem Relation Age of Onset  . Cirrhosis Brother        etoh  . Colon cancer Neg Hx     ALLERGIES:  is allergic to meloxicam.  MEDICATIONS:  Current Outpatient Medications  Medication Sig Dispense Refill  . valsartan-hydrochlorothiazide (DIOVAN-HCT) 160-25 MG tablet Take 1 tablet by mouth daily.    Marland Kitchen acetaminophen (TYLENOL ARTHRITIS PAIN) 650 MG CR tablet Take 1,300 mg by mouth 2 (two) times daily.     Marland Kitchen amLODipine (NORVASC) 5 MG tablet Take 5 mg by mouth at bedtime.     . citalopram (CELEXA) 20 MG tablet Take 20 mg by mouth daily.      . clopidogrel (PLAVIX) 75 MG tablet TAKE 1 TABLET BY MOUTH  DAILY 30 tablet 3  . clotrimazole (LOTRIMIN) 1 % cream Apply 1 application topically 2 (two) times daily as needed. For rash     . cyanocobalamin (,VITAMIN  B-12,) 1000 MCG/ML injection Inject 1,000 mcg into the muscle every 30 (thirty) days. Usually on the 1st     . insulin glargine (LANTUS) 100 UNIT/ML injection Inject 45 Units into the skin at bedtime.     Marland Kitchen LORazepam (ATIVAN) 0.5 MG tablet Take 0.25 mg by mouth daily. Breakfast    . losartan-hydrochlorothiazide (HYZAAR) 100-25 MG tablet Take 1 tablet by mouth daily.    . metFORMIN (GLUCOPHAGE) 1000 MG tablet Take 500 mg by mouth 2 (two) times daily with a meal. After lunch and bedtime    . oxybutynin (DITROPAN-XL) 10 MG 24 hr tablet Take 10 mg by mouth at bedtime.     . pantoprazole (PROTONIX) 40 MG tablet Take 40 mg by mouth daily.    . pramipexole (MIRAPEX) 0.25 MG tablet Take 0.25 mg by mouth at bedtime.     . pravastatin (PRAVACHOL) 80 MG tablet Take 1 tablet (80 mg total) by mouth daily. (Patient taking differently: Take 80 mg by mouth at bedtime. ) 30 tablet 3  . ranitidine (ZANTAC) 150 MG tablet Take 150 mg by mouth at bedtime.    . risperiDONE (RISPERDAL) 0.5 MG tablet Take 0.5 mg by mouth daily. Breakfast    . traMADol (ULTRAM) 50 MG tablet Take 1 tablet (50 mg total) by mouth every 6 (six) hours as needed for moderate pain. 20 tablet 0  . traZODone (DESYREL) 50 MG tablet Take 50 mg by mouth at bedtime.     No current facility-administered medications for this visit.     REVIEW OF SYSTEMS:    A 10+ POINT REVIEW OF SYSTEMS WAS OBTAINED including neurology, dermatology, psychiatry, cardiac, respiratory, lymph, extremities, GI, GU, Musculoskeletal, constitutional, breasts, reproductive, HEENT.  All pertinent positives are noted in the HPI.  All others are negative.   PHYSICAL EXAMINATION: ECOG PERFORMANCE STATUS: 2 - Symptomatic, <50% confined to bed  Vitals:   10/12/18 0840  BP: (!) 130/45  Pulse: 84  Resp: 18  Temp: 97.8 F (36.6 C)  SpO2: 99%   Filed Weights  10/12/18 0840  Weight: 184 lb (83.5 kg)   .Body mass index is 31.58 kg/m.  GENERAL:alert, in no acute  distress and comfortable SKIN: no acute rashes, no significant lesions EYES: conjunctiva are pink and non-injected, sclera anicteric OROPHARYNX: MMM, no exudates, no oropharyngeal erythema or ulceration NECK: supple, no JVD LYMPH:  no palpable lymphadenopathy in the cervical, axillary or inguinal regions LUNGS: clear to auscultation b/l with normal respiratory effort HEART: regular rate & rhythm ABDOMEN:  normoactive bowel sounds , non tender, not distended. Palpable splenomegaly 5 fingerbreadths beneath mid-costal line Extremity: no pedal edema PSYCH: alert & oriented x 3 with fluent speech NEURO: no focal motor/sensory deficits   LABORATORY DATA:  I have reviewed the data as listed  . CBC Latest Ref Rng & Units 10/12/2018 09/22/2018 08/24/2018  WBC 4.0 - 10.5 K/uL 78.1(HH) 86.1(HH) 79.0(HH)  Hemoglobin 12.0 - 15.0 g/dL 8.9(L) 8.8(L) 8.9(L)  Hematocrit 36.0 - 46.0 % 30.1(L) 29.9(L) 29.5(L)  Platelets 150 - 400 K/uL 286 290 304   . CBC    Component Value Date/Time   WBC 78.1 (HH) 10/12/2018 0820   RBC 3.52 (L) 10/12/2018 0820   RBC 3.52 (L) 10/12/2018 0820   HGB 8.9 (L) 10/12/2018 0820   HGB 8.8 (L) 09/22/2018 1124   HGB 11.2 (L) 10/14/2017 0824   HCT 30.1 (L) 10/12/2018 0820   HCT 35.0 10/14/2017 0824   PLT 286 10/12/2018 0820   PLT 290 09/22/2018 1124   PLT 238 10/14/2017 0824   MCV 85.5 10/12/2018 0820   MCV 84.5 10/14/2017 0824   MCH 25.3 (L) 10/12/2018 0820   MCHC 29.6 (L) 10/12/2018 0820   RDW 16.5 (H) 10/12/2018 0820   RDW 19.9 (H) 10/14/2017 0824   LYMPHSABS 70.3 (H) 10/12/2018 0820   LYMPHSABS 16.2 (H) 10/14/2017 0824   MONOABS 3.9 (H) 10/12/2018 0820   MONOABS 0.1 10/14/2017 0824   EOSABS 0.0 10/12/2018 0820   EOSABS 0.1 10/14/2017 0824   BASOSABS 0.0 10/12/2018 0820   BASOSABS 0.0 10/14/2017 0824     . CMP Latest Ref Rng & Units 09/22/2018 08/24/2018 05/18/2018  Glucose 70 - 99 mg/dL 123(H) 210(H) 136(H)  BUN 8 - 23 mg/dL 18 18 16   Creatinine 0.44  - 1.00 mg/dL 0.83 0.95 0.95  Sodium 135 - 145 mmol/L 139 140 140  Potassium 3.5 - 5.1 mmol/L 4.4 4.2 4.2  Chloride 98 - 111 mmol/L 104 104 104  CO2 22 - 32 mmol/L 26 24 26   Calcium 8.9 - 10.3 mg/dL 9.0 9.0 9.1  Total Protein 6.5 - 8.1 g/dL 7.7 7.7 7.4  Total Bilirubin 0.3 - 1.2 mg/dL 0.4 0.4 0.4  Alkaline Phos 38 - 126 U/L 182(H) 194(H) 150(H)  AST 15 - 41 U/L 12(L) 11(L) 11(L)  ALT 0 - 44 U/L 6 10 11     Lab Results  Component Value Date   FERRITIN 148 09/22/2018           . Lab Results  Component Value Date   LDH 418 (H) 09/22/2018         RADIOGRAPHIC STUDIES: I have personally reviewed the radiological images as listed and agreed with the findings in the report. Ct Chest W Contrast  Result Date: 10/06/2018 CLINICAL DATA:  CLL/SLL.  Worsening anemia.  Restaging. EXAM: CT CHEST, ABDOMEN, AND PELVIS WITH CONTRAST TECHNIQUE: Multidetector CT imaging of the chest, abdomen and pelvis was performed following the standard protocol during bolus administration of intravenous contrast. CONTRAST:  158mL OMNIPAQUE IOHEXOL 300 MG/ML  SOLN COMPARISON:  08/26/2016 PET-CT. FINDINGS: CT CHEST FINDINGS Cardiovascular: Normal heart size. No significant pericardial effusion/thickening. Three-vessel coronary atherosclerosis. Atherosclerotic nonaneurysmal thoracic aorta. Normal caliber pulmonary arteries. No central pulmonary emboli. Mediastinum/Nodes: No discrete thyroid nodules. Unremarkable esophagus. No pathologically enlarged axillary, mediastinal or hilar lymph nodes. Lungs/Pleura: No pneumothorax. No pleural effusion. Clustered centrilobular nodularity with faint calcifications in the anterior left lower lobe, unchanged since 08/26/2016 PET-CT, compatible with benign postinflammatory nodularity. No acute consolidative airspace disease, lung masses or new significant pulmonary nodules. Musculoskeletal: No aggressive appearing focal osseous lesions. Partially visualized surgical hardware  from ACDF in the lower cervical spine. Left total shoulder arthroplasty. Mild thoracic spondylosis. CT ABDOMEN PELVIS FINDINGS Hepatobiliary: Scattered granulomatous calcifications throughout the liver, unchanged. No liver mass. Normal gallbladder with no radiopaque cholelithiasis. No biliary ductal dilatation. Pancreas: Normal, with no mass or duct dilation. Spleen: Marked splenomegaly (craniocaudal splenic length 22.8 cm, increased from 15.7 cm on 08/26/2016 PET-CT). No splenic mass. Scattered granulomatous splenic calcifications, unchanged. Adrenals/Urinary Tract: Normal adrenals. Normal kidneys with no hydronephrosis and no renal mass. Bladder obscured by streak artifact from the right hip hardware. Bladder appears nondistended and normal. Stomach/Bowel: Small hiatal hernia. Otherwise normal nondistended stomach. Normal caliber small bowel with no small bowel wall thickening. Appendectomy. Moderate sigmoid diverticulosis, no large bowel wall thickening or acute pericolonic fat stranding. Vascular/Lymphatic: Atherosclerotic nonaneurysmal abdominal aorta. Patent portal, splenic, hepatic and renal veins. No pathologically enlarged lymph nodes in the abdomen or pelvis. Reproductive: Status post hysterectomy, with no abnormal findings at the vaginal cuff. No adnexal mass. Other: No pneumoperitoneum, ascites or focal fluid collection. Musculoskeletal: No aggressive appearing focal osseous lesions. Right total hip arthroplasty. Marked lumbar spondylosis. IMPRESSION: 1. Marked splenomegaly, significantly increased in size since 2017 PET-CT. 2. No lymphadenopathy or other sites of lymphoproliferative disease. 3. Three-vessel coronary atherosclerosis. 4.  Aortic Atherosclerosis (ICD10-I70.0). Electronically Signed   By: Ilona Sorrel M.D.   On: 10/06/2018 15:13   Ct Abdomen Pelvis W Contrast  Result Date: 10/06/2018 CLINICAL DATA:  CLL/SLL.  Worsening anemia.  Restaging. EXAM: CT CHEST, ABDOMEN, AND PELVIS WITH  CONTRAST TECHNIQUE: Multidetector CT imaging of the chest, abdomen and pelvis was performed following the standard protocol during bolus administration of intravenous contrast. CONTRAST:  119mL OMNIPAQUE IOHEXOL 300 MG/ML  SOLN COMPARISON:  08/26/2016 PET-CT. FINDINGS: CT CHEST FINDINGS Cardiovascular: Normal heart size. No significant pericardial effusion/thickening. Three-vessel coronary atherosclerosis. Atherosclerotic nonaneurysmal thoracic aorta. Normal caliber pulmonary arteries. No central pulmonary emboli. Mediastinum/Nodes: No discrete thyroid nodules. Unremarkable esophagus. No pathologically enlarged axillary, mediastinal or hilar lymph nodes. Lungs/Pleura: No pneumothorax. No pleural effusion. Clustered centrilobular nodularity with faint calcifications in the anterior left lower lobe, unchanged since 08/26/2016 PET-CT, compatible with benign postinflammatory nodularity. No acute consolidative airspace disease, lung masses or new significant pulmonary nodules. Musculoskeletal: No aggressive appearing focal osseous lesions. Partially visualized surgical hardware from ACDF in the lower cervical spine. Left total shoulder arthroplasty. Mild thoracic spondylosis. CT ABDOMEN PELVIS FINDINGS Hepatobiliary: Scattered granulomatous calcifications throughout the liver, unchanged. No liver mass. Normal gallbladder with no radiopaque cholelithiasis. No biliary ductal dilatation. Pancreas: Normal, with no mass or duct dilation. Spleen: Marked splenomegaly (craniocaudal splenic length 22.8 cm, increased from 15.7 cm on 08/26/2016 PET-CT). No splenic mass. Scattered granulomatous splenic calcifications, unchanged. Adrenals/Urinary Tract: Normal adrenals. Normal kidneys with no hydronephrosis and no renal mass. Bladder obscured by streak artifact from the right hip hardware. Bladder appears nondistended and normal. Stomach/Bowel: Small hiatal hernia. Otherwise normal nondistended stomach. Normal caliber small bowel  with  no small bowel wall thickening. Appendectomy. Moderate sigmoid diverticulosis, no large bowel wall thickening or acute pericolonic fat stranding. Vascular/Lymphatic: Atherosclerotic nonaneurysmal abdominal aorta. Patent portal, splenic, hepatic and renal veins. No pathologically enlarged lymph nodes in the abdomen or pelvis. Reproductive: Status post hysterectomy, with no abnormal findings at the vaginal cuff. No adnexal mass. Other: No pneumoperitoneum, ascites or focal fluid collection. Musculoskeletal: No aggressive appearing focal osseous lesions. Right total hip arthroplasty. Marked lumbar spondylosis. IMPRESSION: 1. Marked splenomegaly, significantly increased in size since 2017 PET-CT. 2. No lymphadenopathy or other sites of lymphoproliferative disease. 3. Three-vessel coronary atherosclerosis. 4.  Aortic Atherosclerosis (ICD10-I70.0). Electronically Signed   By: Ilona Sorrel M.D.   On: 10/06/2018 15:13    PET/CT 10/25/017:  IMPRESSION: 1. Mildly enlarged and mildly hypermetabolic spleen, consistent with the provided history of a lymphoproliferative disorder. 2. No hypermetabolic lymphadenopathy or other hypermetabolic sites of disease. 3. Additional findings include aortic atherosclerosis, three-vessel coronary atherosclerosis, small hiatal hernia and small fat containing left inguinal hernia.   Electronically Signed   By: Ilona Sorrel M.D.   On: 08/26/2016 10:09     ASSESSMENT & PLAN:   77 y.o. 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 Noted to have elevated wbc/lymphocyte counts end of September. Unknown if she had a more chronic elevation of her WBC counts that might put a timeline on her lymphocytosis. -Differential diagnosis- CD5 neg variant of CLL (about 17-20% patients with CLL could be CD5neg) vs B-cell leukemia/lymphoma -08/26/16 PET/CT with no significant LNadenopathy and PET/CT  with minimal enlarged and minimally hypermetabolic. FISH shows Trisomy 73 which would be consistent with CLL Features consistent with indolent chronic lymphoproliferative process - likely CLL   PLAN: -Discussed pt labwork today, 10/12/18; Lymphs abs at 70.3k, HGB stable at 8.9, normal PLT at 286k. LDH is pending.  -Discussed the 10/06/18 CT C/A/P which revealed Marked splenomegaly, significantly increased in size since 2017 PET-CT. 2. No lymphadenopathy or other sites of lymphoproliferative disease. 3. Three-vessel coronary atherosclerosis. 4.  Aortic Atherosclerosis. -Anemia as primary factor for considering initiating treatment  -Discussed the indications for treatment again with the pt and her daughter, and the decision was made by the patient with my recommendation to begin Venetoclax -Will begin Venetoclax in one week -Will watch labs closely for toxicity checks -Provided supplemental information regarding Venetoclax   -Haptoglobin not decreased,coombs neg) -Pt continues to not have any constitutional symptoms -Will see the pt back in 2 weeks  2) Anemia - microcytic likely related to iron deficiency previously .  Plan -continue Polysaccharide iron 150mg  po BID. She also has been taking Protonix3 -HGB down to 8.9 -No indication for IV iron at this time . 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 Brings up the possibility of lymphoplasmacytic lymphoma however trisomy 12 is typical for CLL and uncommon in LPL/WM M spike 0.4g/dl unchanged  4). Patient Active Problem List   Diagnosis Date Noted  . History of stroke 08/04/2017  . IDA (iron deficiency anemia) 03/25/2017  . Somnolence, daytime 12/31/2016  . Snoring 12/31/2016  . Occipital neuralgia   . Acute ischemic stroke ([place])   . Stroke ([place])   . Cerebral infarction due to  unspecified occlusion or stenosis of right anterior cerebral  artery ([place]) 08/01/2016  . Leukocytosis 08/01/2016  . Urinary tract infectious disease 08/01/2016  . Type 2 diabetes mellitus without complication, with long-term current use of insulin ([place]) 07/30/2016  . Essential hypertension 07/30/2016  . Osteoarthritis 07/30/2016  . Normocytic anemia 07/30/2016  . HLD (hyperlipidemia)   . Schatzki's ring   . Cervical herniated disc 03/15/2012  . GERD 04/29/2010  . CHEST PAIN, ATYPICAL 04/29/2010  . DYSPHAGIA UNSPECIFIED 04/29/2010  -continue rf/u with PCP.   RTC with Dr Irene Limbo in 2 weeks with labs (1 week after starting Venetoclax) Will schedule for fluids and weekly labs in the first month to monitorforTLS    All of the patients questions were answered with apparent satisfaction. The patient knows to call the clinic with any problems, questions or concerns.   The total time spent in the appt was 25 minutes and more than 50% was on counseling and direct patient cares.   Sullivan Lone MD [place] AAHIVMS [facility] Hematology/Oncology Physician [facility]  (Office):       [phone] (Work cell):  [phone] (Fax):           [phone]  I, Baldwin [place], am acting as a scribe for Dr. Sullivan Lone.   .I have reviewed the above documentation for accuracy and completeness, and I agree with the above. Brunetta Genera MD

## 2018-10-12 ENCOUNTER — Encounter: Payer: Self-pay | Admitting: Hematology

## 2018-10-12 ENCOUNTER — Inpatient Hospital Stay: Payer: Medicare Other | Admitting: Hematology

## 2018-10-12 ENCOUNTER — Telehealth: Payer: Self-pay | Admitting: Hematology

## 2018-10-12 ENCOUNTER — Inpatient Hospital Stay: Payer: Medicare Other | Attending: Hematology

## 2018-10-12 VITALS — BP 130/45 | HR 84 | Temp 97.8°F | Resp 18 | Ht 64.0 in | Wt 184.0 lb

## 2018-10-12 DIAGNOSIS — C9110 Chronic lymphocytic leukemia of B-cell type not having achieved remission: Secondary | ICD-10-CM | POA: Insufficient documentation

## 2018-10-12 DIAGNOSIS — D649 Anemia, unspecified: Secondary | ICD-10-CM

## 2018-10-12 DIAGNOSIS — D509 Iron deficiency anemia, unspecified: Secondary | ICD-10-CM | POA: Diagnosis not present

## 2018-10-12 LAB — CMP (CANCER CENTER ONLY)
ALBUMIN: 3.2 g/dL — AB (ref 3.5–5.0)
ALT: 10 U/L (ref 0–44)
AST: 13 U/L — ABNORMAL LOW (ref 15–41)
Alkaline Phosphatase: 188 U/L — ABNORMAL HIGH (ref 38–126)
Anion gap: 11 (ref 5–15)
BUN: 17 mg/dL (ref 8–23)
CO2: 26 mmol/L (ref 22–32)
Calcium: 9.4 mg/dL (ref 8.9–10.3)
Chloride: 105 mmol/L (ref 98–111)
Creatinine: 0.82 mg/dL (ref 0.44–1.00)
GFR, Est AFR Am: >60 mL/min (ref >60)
GFR, Estimated: >60 mL/min (ref >60)
Glucose, Bld: 87 mg/dL (ref 70–99)
Potassium: 4.4 mmol/L (ref 3.5–5.1)
Sodium: 142 mmol/L (ref 135–145)
Total Bilirubin: 0.4 mg/dL (ref 0.3–1.2)
Total Protein: 8 g/dL (ref 6.5–8.1)

## 2018-10-12 LAB — RETICULOCYTES
Immature Retic Fract: 22.7 % — ABNORMAL HIGH (ref 2.3–15.9)
RBC.: 3.52 MIL/uL — ABNORMAL LOW (ref 3.87–5.11)
Retic Count, Absolute: 71.1 10*3/uL (ref 19.0–186.0)
Retic Ct Pct: 2 % (ref 0.4–3.1)

## 2018-10-12 LAB — CBC WITH DIFFERENTIAL/PLATELET
Abs Immature Granulocytes: 0 10*3/uL (ref 0.00–0.07)
Basophils Absolute: 0 10*3/uL (ref 0.0–0.1)
Basophils Relative: 0 %
Eosinophils Absolute: 0 10*3/uL (ref 0.0–0.5)
Eosinophils Relative: 0 %
HCT: 30.1 % — ABNORMAL LOW (ref 36.0–46.0)
Hemoglobin: 8.9 g/dL — ABNORMAL LOW (ref 12.0–15.0)
Lymphocytes Relative: 90 %
Lymphs Abs: 70.3 10*3/uL — ABNORMAL HIGH (ref 0.7–4.0)
MCH: 25.3 pg — ABNORMAL LOW (ref 26.0–34.0)
MCHC: 29.6 g/dL — ABNORMAL LOW (ref 30.0–36.0)
MCV: 85.5 fL (ref 80.0–100.0)
Monocytes Absolute: 3.9 10*3/uL — ABNORMAL HIGH (ref 0.1–1.0)
Monocytes Relative: 5 %
Neutro Abs: 3.9 10*3/uL (ref 1.7–17.7)
Neutrophils Relative %: 5 %
Platelets: 286 10*3/uL (ref 150–400)
RBC: 3.52 MIL/uL — AB (ref 3.87–5.11)
RDW: 16.5 % — ABNORMAL HIGH (ref 11.5–15.5)
WBC: 78.1 10*3/uL (ref 4.0–10.5)
nRBC: 0 % (ref 0.0–0.2)

## 2018-10-12 LAB — LACTATE DEHYDROGENASE: LDH: 342 U/L — ABNORMAL HIGH (ref 98–192)

## 2018-10-12 MED ORDER — VENETOCLAX 10 & 50 & 100 MG PO TBPK: ORAL_TABLET | ORAL | 0 refills | Status: DC

## 2018-10-12 NOTE — Patient Instructions (Signed)
Thank you for choosing [facility] to provide your oncology and hematology care.  To afford each patient quality time with our providers, please arrive 30 minutes before your scheduled appointment time.  If you arrive late for your appointment, you may be asked to reschedule.  We strive to give you quality time with our providers, and arriving late affects you and other patients whose appointments are after yours.    If you are a no show for multiple scheduled visits, you may be dismissed from the clinic at the providers discretion.     Again, thank you for choosing [facility], our hope is that these requests will decrease the amount of time that you wait before being seen by our physicians.  ______________________________________________________________________   Should you have questions after your visit to the [facility], please contact our office at [phone] between the hours of 8:30 and 4:30 p.m.    Voicemails left after 4:30p.m will not be returned until the following business day.     For prescription refill requests, please have your pharmacy contact us directly.  Please also try to allow 48 hours for prescription requests.     Please contact the scheduling department for questions regarding scheduling.  For scheduling of procedures such as PET scans, CT scans, MRI, Ultrasound, etc please contact central scheduling at [phone].     Resources For Cancer Patients and Caregivers:    Oncolink.org:  A wonderful resource for patients and healthcare providers for information regarding your disease, ways to tract your treatment, what to expect, etc.      American Cancer Society:  800-227-2345  Can help patients locate various types of support and financial assistance   Cancer Care: 1-800-813-HOPE (4673) Provides financial assistance, online support groups, medication/co-pay assistance.     [place] DSS:  336-641-3447 Where to apply  for food stamps, Medicaid, and utility assistance   Medicare Rights Center: 800-333-4114 Helps people with Medicare understand their rights and benefits, navigate the Medicare system, and secure the quality healthcare they deserve   SCAT: 336-333-6589 [place] [employer]'s shared-ride transportation service for eligible riders who have a disability that prevents them from riding the fixed route bus.     For additional information on assistance programs please contact our social worker:   Abigail Elmore:  [phone]  

## 2018-10-12 NOTE — Telephone Encounter (Signed)
Printed calendar and avs. °

## 2018-10-13 ENCOUNTER — Telehealth: Payer: Self-pay | Admitting: Pharmacist

## 2018-10-13 DIAGNOSIS — C9110 Chronic lymphocytic leukemia of B-cell type not having achieved remission: Secondary | ICD-10-CM | POA: Diagnosis not present

## 2018-10-13 DIAGNOSIS — I10 Essential (primary) hypertension: Secondary | ICD-10-CM | POA: Diagnosis not present

## 2018-10-13 DIAGNOSIS — I700 Atherosclerosis of aorta: Secondary | ICD-10-CM | POA: Diagnosis not present

## 2018-10-13 DIAGNOSIS — E1129 Type 2 diabetes mellitus with other diabetic kidney complication: Secondary | ICD-10-CM | POA: Diagnosis not present

## 2018-10-13 MED ORDER — VENETOCLAX 50 MG PO TABS: 50.0000 mg | ORAL_TABLET | Freq: Every day | ORAL | 0 refills | Status: AC

## 2018-10-13 MED ORDER — VENETOCLAX 10 MG PO TABS: 20.0000 mg | ORAL_TABLET | Freq: Every day | ORAL | 0 refills | Status: AC

## 2018-10-13 NOTE — Telephone Encounter (Signed)
Oral Oncology Pharmacist Encounter  Submitted for insurance authorization of Venclexta to [employer] on Cover My Meds Submitted for 10mg  tablets, 50mg  tablets, and 100mg  tablets.  PA for 10mg  tabs and 100mg  tabs previously approved.  Venclexta 50mg  tablets Key: [id number] Ref# [id number] Status is N/A:   PA request states product was previously approved on [id number] (100mg  tab request) with effective dates 23/09/2018-11/03/2019.   Prescription will be able to be filled at a pharmacy.  Issues with pharmacy processing prescription, call [employer] pharmacy help desk at [place], PharmD, BCPS, BCOP  10/13/2018 12:40 PM [facility] [phone]

## 2018-10-13 NOTE — Telephone Encounter (Signed)
Oral Oncology Pharmacist Encounter  Submitted for insurance authorization of Venclexta to [employer] on Cover My Meds  Venclexta 10mg  tablets Key: [id number] Status is approved Effective dates: 10/13/2018-11/03/2019 Ref# [id number]  Venclexta 50mg  tablets Key: [id number] Status is pending Ref# [id number]  Venclexta 100mg  tablets Key: [id number] Status is approved Effective dates: 10/13/2018-11/03/2019 Ref# [id number]  Johny Drilling, PharmD, BCPS, BCOP  10/13/2018 12:34 PM Oral Oncology Clinic [phone]

## 2018-10-13 NOTE — Telephone Encounter (Signed)
Oral Oncology Pharmacist Encounter  Received new prescription for Venclexta (venetoclax) for the treatment of chronic lymphocytic leukemia, previously untreated, planned duration 1 year of therapy, until optimal disease control, or until unacceptable toxicity.  Venclexta will be initiated on a slow up-titration per discussion with MD. Plan to initiate Venclexta at 20mg  daily for 14 days. Will then increase to 50mg  daily for 14 days. Then increase to 100mg  daily. Further dose escalation based on response and tolerance.  Labs from 10/12/2018 assessed, OK for treatment initiation. SCr=0.82, round to 1.0 for age > 74, est CrCl ~ 60 mL/min (likely over-estimated with wt=83.5kg) ALN=70.3k LDH=342  Tumor lysis risk at least medium due to:   ALN > 25k at 70.3k  CrCl < 80 mL/min at ~ 72mL/min  Marked splenomegaly despite lack of adenopathy Site of initiation of Venclexta will be discussed with MD.  Current medication list in Epic reviewed, no DDIs with Venclexta identified.  Prescriptions for the 1st 28 days of Venclexta have been e-scribed to the [facility] for benefits analysis and approval.  Oral Oncology Clinic will continue to follow for insurance authorization, copayment issues, initial counseling and start date.  Johny Drilling, PharmD, BCPS, BCOP  10/13/2018 8:36 AM Oral Oncology Clinic [phone]

## 2018-10-14 ENCOUNTER — Encounter: Payer: Self-pay | Admitting: Hematology

## 2018-10-14 ENCOUNTER — Telehealth: Payer: Self-pay

## 2018-10-14 DIAGNOSIS — C9110 Chronic lymphocytic leukemia of B-cell type not having achieved remission: Secondary | ICD-10-CM | POA: Insufficient documentation

## 2018-10-14 NOTE — Telephone Encounter (Signed)
Oral Chemotherapy Pharmacist Encounter  I spoke with patient's daughter, [name], for overview of: Venclexta (venetoclax) for the treatment of chronic lymphocytic leukemia, previously untreated, planned duration 1 year of therapy, until optimal disease control, or until unacceptable toxicity.  Counseled on administration, dosing, side effects, monitoring, drug-food interactions, safe handling, storage, and disposal.  Patient will take Venclexta 10mg  tablets, 2 tablets (20mg ) by mouth once daily with food and water for 14 days.  The 1st dose of Venclexta 20mg  will be administered outpatient based on tumor lysis syndrome (TLS) risk assessment to be medium risk. Baseline labs will be assessed and the 1st dose will be administered. CBC, uric acid, and electrolytes will be monitored at 6 hours post dose administration per protocol. Hydration will be administered IV.  Venclexta start date: TBD, week of 10/18/18, coordinating infusion and lab appointments  [name] was instructed to have her mom start allopurinol 100mg  every 12 hours, starting 3 days prior to [facility] initiation. Allopurinol will be continued until discontinued by provider. Prescription has [facility] on [address] [facility] per daughter's request.  [name] was instructed to have her mother increase fluid intake to 1.5 - 2L of water per day starting 2 days prior to [facility] initiation.  Patient will take Venclexta 50mg  tablets, 1 tablet by mouth once daily with food and water for 14 days. The 1st dose of Venclexta 50mg  tablets will be administered outpatient based on TLS risk assessment. Baseline labs will be assessed and the dose will be administered. CBC, uric acid, and electrolytes will be monitored at 6 hours post dose administration per protocol. Hydration will be administered IV.  Subsequent ramp-up doses, target dosing, and monitoring will be planned once patient is assessed at the 50mg  daily dose.  Adverse effects  include but are not limited to: TLS, decreased blood counts, electrolyte abnormalities, diarrhea, nausea, fatigue, arthralgias/myalgias, and upper respiratory tract infection.    Prescription for ondansetron [facility] [address] [address] [facility] per daughter's request.   They will obtain anti diarrheal and alert the office of 4 or more loose stools above baseline.  Reviewed importance of keeping a medication schedule and plan for any missed doses.  [name] voiced understanding and appreciation.   All questions answered. Medication reconciliation performed and medication/allergy list updated.  Venclexta for the 1st 28 days will be available for pick-up from the [place] 10/15/18 after 2pm. Copayments due at the pharmacy for [place] will be covered by copayment foundation grant secured by oral oncology patient advocate.  [name] knows to call the office with questions or concerns. Oral Oncology Clinic will continue to follow.  Johny Drilling, PharmD, BCPS, BCOP  10/14/2018   3:31 PM Oral Oncology Clinic [phone]

## 2018-10-14 NOTE — Telephone Encounter (Signed)
Oral Oncology Patient Advocate Encounter  I was successful at securing a grant with [employer] for $8000. This will keep the out of pocket expense for Venclexta at $0. The grant information is as follows and has been shared with Theresa Kramer.  Approval dates: 09/14/18-09/14/19 ID: [id number] Group: [id number] BIN: [id number] PCN: [id number]  I spoke to the patients daughter Theresa Kramer and gave her this information. She verbalized understanding and great appreciation   [name] Phone [phone] Fax [phone]

## 2018-10-15 ENCOUNTER — Telehealth: Payer: Self-pay | Admitting: *Deleted

## 2018-10-15 MED ORDER — ONDANSETRON HCL 8 MG PO TABS: 8.0000 mg | ORAL_TABLET | Freq: Three times a day (TID) | ORAL | 0 refills | Status: AC | PRN

## 2018-10-15 MED ORDER — ALLOPURINOL 100 MG PO TABS: 100.0000 mg | ORAL_TABLET | Freq: Two times a day (BID) | ORAL | 2 refills | Status: AC

## 2018-10-15 MED FILL — VENCLEXTA 10 MG TABS: 10 | 14 days supply | Qty: 28 | Fill #0

## 2018-10-15 MED FILL — VENCLEXTA 50 MG TABS: 50 | 14 days supply | Qty: 14 | Fill #0

## 2018-10-15 NOTE — Telephone Encounter (Signed)
Patient daughter has received several emails with differing appt schedules today for mother's treatment beginning next week. Advised her this writer will clarify appts on Monday and call her back. She verbalized understating

## 2018-10-18 NOTE — Telephone Encounter (Signed)
Oral Oncology Pharmacist Encounter  Patient scheduled for Venclexta initiation on 10/21/18  Labs have been entered for:   10/21/18 AM lab check:   CBC w/ diff  CMET  LDH  uric acid  phosphorus  10/21/18 PM lab check:   CMET  LDH  uric acid  phosphorus  10/22/18 AM lab check:   CBC w/ diff  CMET  LDH  uric acid  phosphorus  Johny Drilling, PharmD, BCPS, BCOP  10/18/2018 9:15 AM Oral Oncology Clinic [phone]

## 2018-10-19 NOTE — Telephone Encounter (Signed)
Oral Oncology Patient Advocate Encounter  Confirmed with [place] that Venclexta was picked up on 10/15/18. The 50mg  and 10mg  was picked up and both had $0 copays using the grant.   [place] Patient Theresa Kramer Phone [phone] Fax [phone]

## 2018-10-20 ENCOUNTER — Inpatient Hospital Stay: Payer: Medicare Other

## 2018-10-20 ENCOUNTER — Telehealth: Payer: Self-pay | Admitting: *Deleted

## 2018-10-20 NOTE — Telephone Encounter (Signed)
Per Dr. Irene Limbo: Postpone beginning Venclexta tomorrow and begin treatment schedule the first full week of January.  Contacted daughter [name] with this information. She verbalized understanding, stating trying to schedule labs and treatments around the holidays was confusing. Daughter had started giving patient Allopurinol on Monday (3 days prior to beginning treatment).Per pharmacist, can stop given allopurinol until new schedule established. Daughter verbalized understanding and stated she will restart allopurinol once new treatment schedule is established.

## 2018-10-21 ENCOUNTER — Other Ambulatory Visit: Payer: Medicare Other

## 2018-10-21 ENCOUNTER — Ambulatory Visit: Payer: Medicare Other

## 2018-10-22 ENCOUNTER — Inpatient Hospital Stay: Payer: Medicare Other

## 2018-10-25 ENCOUNTER — Inpatient Hospital Stay: Payer: Medicare Other

## 2018-10-25 DIAGNOSIS — Z961 Presence of intraocular lens: Secondary | ICD-10-CM | POA: Diagnosis not present

## 2018-10-25 DIAGNOSIS — E119 Type 2 diabetes mellitus without complications: Secondary | ICD-10-CM | POA: Diagnosis not present

## 2018-10-25 DIAGNOSIS — Z794 Long term (current) use of insulin: Secondary | ICD-10-CM | POA: Diagnosis not present

## 2018-10-28 ENCOUNTER — Encounter (HOSPITAL_COMMUNITY): Payer: Self-pay | Admitting: Emergency Medicine

## 2018-10-28 ENCOUNTER — Ambulatory Visit (HOSPITAL_COMMUNITY)
Admission: EM | Admit: 2018-10-28 | Discharge: 2018-10-28 | Disposition: A | Discharge status: Home / Self Care | Payer: Medicare Other | Source: Home / Self Care | Attending: Family Medicine | Admitting: Family Medicine

## 2018-10-28 ENCOUNTER — Other Ambulatory Visit: Payer: Medicare Other

## 2018-10-28 ENCOUNTER — Ambulatory Visit: Payer: Medicare Other | Admitting: Hematology

## 2018-10-28 DIAGNOSIS — J22 Unspecified acute lower respiratory infection: Secondary | ICD-10-CM | POA: Diagnosis not present

## 2018-10-28 MED ORDER — BENZONATATE 100 MG PO CAPS: 100.0000 mg | ORAL_CAPSULE | Freq: Three times a day (TID) | ORAL | 0 refills | Status: AC

## 2018-10-28 MED ORDER — AMOXICILLIN 875 MG PO TABS: 875.0000 mg | ORAL_TABLET | Freq: Two times a day (BID) | ORAL | 0 refills | Status: AC

## 2018-10-28 NOTE — ED Triage Notes (Signed)
Pt presents to [facility] for cough since yesterday, and fevers and productivity noted by daughter today.   Hx of vascular dementia.

## 2018-10-28 NOTE — Discharge Instructions (Addendum)
Give the antibiotic 2 x a day Use the tessalon as needed for cough Drink plenty of fluids Expect improvement in a few days See your PCP or retrun if worse, or if you fail to improve

## 2018-10-28 NOTE — ED Provider Notes (Signed)
[place]    CSN: [id number] Arrival date & time: 10/28/18  1122     History   Chief Complaint Chief Complaint  Patient presents with  . Flu-like Symptoms    HPI Theresa Kramer is a 77 y.o. female.   HPI  Mrs. Cullins is a frail 77 year old woman who is here today with her daughter.  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  Her daughter is her full-time caregiver.  She has multiple medical problems.  She takes 18 prescription medicines on a daily basis.  She has chronic lymphocytic leukemia and is due to start treatment for this next week.  Daughter states she gets a cough every winter.  The oncologist told to be wary of any coughing or infections while she is on her chemotherapy.  She started coughing 3 days ago.  The cough is getting much worse each day, and today she had a fever.  She is acting more forgetful.  She is acting a little apathetic.  No nausea or vomiting.  She does not complain of chest pain.  Unknown if there is any sputum production.  No underlying lung disease or asthma.  Past Medical History:  Diagnosis Date  . Arthritis   . B12 deficiency   . Bronchitis    hx of  . Cancer ([place])   . Complication of anesthesia   . Depression    "not taking medication"  . Diabetes mellitus   . GERD (gastroesophageal reflux disease)   . H/O hiatal hernia   . Hyperlipidemia   . Hypertension   . Noncompliance with medication regimen   . Pinched cervical nerve root   . PONV (postoperative nausea and vomiting)   . Stroke ([place])   . Urinary tract infection    hx of    Patient Active Problem List   Diagnosis Date Noted  . CLL (chronic lymphocytic leukemia) ([place]) 10/14/2018  . History of stroke 08/04/2017  . IDA (iron deficiency anemia) 03/25/2017  . Somnolence, daytime 12/31/2016  . Snoring 12/31/2016  . Occipital neuralgia   . Acute ischemic stroke ([place])   . Stroke ([place])   . 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 Leukocytosis 08/01/2016  . Urinary tract infectious disease 08/01/2016  . Type 2 diabetes mellitus without complication, with long-term current use of insulin ([place]) 07/30/2016  . Essential hypertension 07/30/2016  . Osteoarthritis 07/30/2016  . Normocytic anemia 07/30/2016  . HLD (hyperlipidemia)   . Schatzki's ring   . Cervical herniated disc 03/15/2012  . GERD 04/29/2010  . CHEST PAIN, ATYPICAL 04/29/2010  . DYSPHAGIA UNSPECIFIED 04/29/2010    Past Surgical History:  Procedure Laterality Date  . ABDOMINAL HYSTERECTOMY    . ANTERIOR CERVICAL DECOMP/DISCECTOMY FUSION  03/15/2012   Procedure: ANTERIOR CERVICAL DECOMPRESSION/DISCECTOMY FUSION 2 LEVELS;  Surgeon: Ophelia Charter, MD;  Location: [facility];  Service: Neurosurgery;  Laterality: N/A;  Cervical four-five Cervical five six anterior cervical decompression with fusion interbody prothesis plating and bonegraft  . APPENDECTOMY    . BIOPSY  05/18/2017   Procedure: BIOPSY;  Surgeon: Daneil Dolin, MD;  [facility]: [facility];  Service: Endoscopy;;  gastric bx's  . BONE CYST EXCISION     from lower back and foot  . CHOLECYSTECTOMY    . COLONOSCOPY  2011   Dr. Gala Romney: prep compromised exam. diverticulosis, hemorrhoids. next tcs 2021  . COLONOSCOPY WITH  PROPOFOL N/A 05/18/2017   Procedure: COLONOSCOPY WITH PROPOFOL;  Surgeon: Daneil Dolin, MD;  [facility];  Service: Endoscopy;  Laterality: N/A;  1100  . DILATION AND CURETTAGE OF UTERUS    . 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 Multiple distal rings dilated and disrupted as described above. Small hiatal hernia.   Marland Kitchen ESOPHAGOGASTRODUODENOSCOPY (EGD) WITH PROPOFOL N/A 05/18/2017   Procedure: ESOPHAGOGASTRODUODENOSCOPY (EGD) WITH PROPOFOL;  Surgeon: Daneil Dolin, MD;  [facility];  Service: Endoscopy;   Laterality: N/A;  . EXCISION METACARPAL MASS Left 03/03/2016   Procedure: EXCISION OF LEFT  DORSAL MASS OF EXTENSOR TENDON;  Surgeon: Milly Jakob, MD;  Location: [facility] [place];  Service: Orthopedics;  Laterality: Left;  . EYE SURGERY     bilateral cataract surgery,   . HIP ARTHROPLASTY     right  . JOINT REPLACEMENT    . KNEE SURGERY     torn cartilage repair  . MALONEY DILATION N/A 09/19/2014   Procedure: Venia Minks DILATION;  Surgeon: Daneil Dolin, MD;  [facility];  Service: Endoscopy;  Laterality: N/A;  . ORTHOPEDIC SURGERY    . POLYPECTOMY  05/18/2017   Procedure: POLYPECTOMY;  Surgeon: Daneil Dolin, MD;  [facility];  Service: Endoscopy;;  polyp at hepatic flexure, descending colon  . ROTATOR CUFF REPAIR     "multiple in both shoulders" and total shoulder replacement in left arm  . SAVORY DILATION N/A 09/19/2014   Procedure: SAVORY DILATION;  Surgeon: Daneil Dolin, MD;  [facility];  Service: Endoscopy;  Laterality: N/A;  . TONSILLECTOMY      OB History   No obstetric history on file.      Home Medications    Prior to Admission medications   Medication Sig Start Date End Date Taking? Authorizing Provider  acetaminophen (TYLENOL ARTHRITIS PAIN) 650 MG CR tablet Take 1,300 mg by mouth 2 (two) times daily.     [provider]  allopurinol (ZYLOPRIM) 100 MG tablet Take 1 tablet (100 mg total) by mouth 2 (two) times daily. 10/15/18   Brunetta Genera, MD  amLODipine (NORVASC) 5 MG tablet Take 5 mg by mouth at bedtime.     [provider]  amoxicillin (AMOXIL) 875 MG tablet Take 1 tablet (875 mg total) by mouth 2 (two) times daily. 10/28/18   Raylene Everts, MD  benzonatate (TESSALON) 100 MG capsule Take 1 capsule (100 mg total) by mouth every 8 (eight) hours. 10/28/18   Raylene Everts, MD  citalopram (CELEXA) 20 MG tablet Take 20 mg by mouth daily.      [provider]  clopidogrel  (PLAVIX) 75 MG tablet TAKE 1 TABLET BY MOUTH  DAILY 12/18/17   Rosalin Hawking, MD  clotrimazole (LOTRIMIN) 1 % cream Apply 1 application topically 2 (two) times daily as needed. For rash     [provider]  cyanocobalamin (,VITAMIN B-12,) 1000 MCG/ML injection Inject 1,000 mcg into the muscle every 30 (thirty) days. Usually on the 1st     [provider]  insulin glargine (LANTUS) 100 UNIT/ML injection Inject 45 Units into the skin at bedtime.     [provider]  LORazepam (ATIVAN) 0.5 MG tablet Take 0.25 mg by mouth daily. Breakfast    [provider]  metFORMIN (GLUCOPHAGE) 1000 MG tablet Take 500 mg by mouth 2 (two) times daily with a meal. After lunch  and bedtime    [provider]  ondansetron (ZOFRAN) 8 MG tablet Take 1 tablet (8 mg total) by mouth every 8 (eight) hours as needed for nausea or vomiting. 10/15/18   Brunetta Genera, MD  oxybutynin (DITROPAN-XL) 10 MG 24 hr tablet Take 10 mg by mouth at bedtime.     [provider]  pantoprazole (PROTONIX) 40 MG tablet Take 40 mg by mouth daily.    [provider]  pramipexole (MIRAPEX) 0.25 MG tablet Take 0.25 mg by mouth at bedtime.     [provider]  pravastatin (PRAVACHOL) 80 MG tablet Take 1 tablet (80 mg total) by mouth daily. Patient taking differently: Take 80 mg by mouth at bedtime.  08/01/16   Kelvin Cellar, MD  ranitidine (ZANTAC) 150 MG tablet Take 150 mg by mouth at bedtime.    [provider]  risperiDONE (RISPERDAL) 0.5 MG tablet Take 0.5 mg by mouth daily. Breakfast    [provider]  traMADol (ULTRAM) 50 MG tablet Take 1 tablet (50 mg total) by mouth every 6 (six) hours as needed for moderate pain. 08/04/16   Raiford Noble Latif, DO  traZODone (DESYREL) 50 MG tablet Take 50 mg by mouth at bedtime.    [provider]  valsartan-hydrochlorothiazide (DIOVAN-HCT) 160-25 MG tablet Take 1 tablet by mouth daily.    [provider]  venetoclax 10 MG TABS Take 20 mg by mouth daily. Take with food and a glass of water. Take for days 1-14 of 1st cycle. 10/13/18   Brunetta Genera, MD  Venetoclax 50 MG TABS Take 50 mg by mouth daily. Take with food and a glass of water. Take on days 15-28 of 1st cycle. 10/13/18   Brunetta Genera, MD    Family History Family History  Problem Relation Age of Onset  . Cirrhosis Brother        etoh  . Colon cancer Neg Hx     Social History Social History   Tobacco Use  . Smoking status: Former Smoker    Last attempt to quit: 05/13/1974    Years since quitting: 44.4  . Smokeless tobacco: Never Used  . Tobacco comment: stopped 25 years ago  Substance Use Topics  . Alcohol use: No  . Drug use: No     Allergies   Meloxicam   Review of Systems Review of Systems  Constitutional: Positive for fever. Negative for chills.  HENT: Negative for ear pain and sore throat.   Eyes: Negative for pain and visual disturbance.  Respiratory: Positive for cough. Negative for shortness of breath.   Cardiovascular: Negative for chest pain and palpitations.  Gastrointestinal: Negative for abdominal pain and vomiting.  Genitourinary: Negative for dysuria and hematuria.  Musculoskeletal: Negative for arthralgias and back pain.  Skin: Negative for color change and rash.  Neurological: Negative for seizures and syncope.  Psychiatric/Behavioral: Positive for confusion and decreased concentration.  All other systems reviewed and are negative.    Physical Exam Triage Vital Signs ED Triage Vitals [10/28/18 1300]  Enc Vitals Group     BP 132/77     Pulse Rate (!) 106     Resp 18     Temp (!) 100.4 F (38 C)     Temp Source Oral     SpO2 94 %     Weight      Height      Head Circumference      Peak Flow  Pain Score 0     Pain Loc      Pain Edu?      Excl. in [place]?    No data found.  Updated Vital Signs BP 132/77 (BP Location: Left Arm)   Pulse (!) 106    Temp (!) 100.4 F (38 C) (Oral)   Resp 18   SpO2 94%   Visual Acuity Right Eye Distance:   Left Eye Distance:   Bilateral Distance:    Right Eye Near:   Left Eye Near:    Bilateral Near:     Physical Exam Constitutional:      General: She is not in acute distress.    Appearance: She is well-developed. She is not ill-appearing.  HENT:     Head: Normocephalic and atraumatic.     Right Ear: Tympanic membrane and ear canal normal.     Left Ear: Tympanic membrane and ear canal normal.     Mouth/Throat:     Mouth: Mucous membranes are moist.  Eyes:     Conjunctiva/sclera: Conjunctivae normal.     Pupils: Pupils are equal, round, and reactive to light.  Neck:     Musculoskeletal: Normal range of motion.  Cardiovascular:     Rate and Rhythm: Normal rate and regular rhythm.     Heart sounds: Murmur present.  Pulmonary:     Effort: Pulmonary effort is normal. No respiratory distress.     Comments: Bibasilar crackles Abdominal:     General: There is no distension.     Palpations: Abdomen is soft.  Musculoskeletal: Normal range of motion.  Lymphadenopathy:     Cervical: No cervical adenopathy.  Skin:    General: Skin is warm and dry.  Neurological:     General: No focal deficit present.     Mental Status: She is alert. Mental status is at baseline.  Psychiatric:     Comments: Speaks very little.  Looks to daughter for answers.  Frequent harsh cough.      UC Treatments / Results  Labs (all labs ordered are listed, but only abnormal results are displayed) Labs Reviewed - No data to display  EKG None  Radiology No results found.  Procedures Procedures (including critical care time)  Medications Ordered in UC Medications - No data to display  Initial Impression / Assessment and Plan / UC Course  I have reviewed the triage vital signs and the nursing notes.  Pertinent labs & imaging results that were available during my care of the patient were reviewed by me  and considered in my medical decision making (see chart for details).     This is likely a viral upper respiratory infection.  Because of the patient's comorbidities I am going to cover her with antibiotics.  Medicare is reviewed with daughter. Final Clinical Impressions(s) / UC Diagnoses   Final diagnoses:  Lower resp. tract infection     Discharge Instructions     Give the antibiotic 2 x a day Use the tessalon as needed for cough Drink plenty of fluids Expect improvement in a few days See your PCP or retrun if worse, or if you fail to improve   ED Prescriptions    Medication Sig Dispense Auth. Provider   amoxicillin (AMOXIL) 875 MG tablet Take 1 tablet (875 mg total) by mouth 2 (two) times daily. 14 tablet Raylene Everts, MD   benzonatate (TESSALON) 100 MG capsule Take 1 capsule (100 mg total) by mouth every 8 (eight) hours. 21 capsule Meda Coffee,  Jennette Banker, MD     Controlled Substance Prescriptions [place] Controlled Substance Registry consulted? Not Applicable   Raylene Everts, MD 10/28/18 [phone]

## 2018-10-29 ENCOUNTER — Other Ambulatory Visit: Payer: Self-pay

## 2018-10-29 ENCOUNTER — Inpatient Hospital Stay (HOSPITAL_COMMUNITY)
Admission: EM | Admit: 2018-10-29 | Discharge: 2018-11-03 | DRG: 193 | Disposition: E | Payer: Medicare Other | Source: Ambulatory Visit | Attending: Internal Medicine | Admitting: Internal Medicine

## 2018-10-29 ENCOUNTER — Encounter (HOSPITAL_COMMUNITY): Payer: Self-pay | Admitting: Emergency Medicine

## 2018-10-29 ENCOUNTER — Emergency Department (HOSPITAL_COMMUNITY): Payer: Medicare Other

## 2018-10-29 DIAGNOSIS — E785 Hyperlipidemia, unspecified: Secondary | ICD-10-CM | POA: Diagnosis not present

## 2018-10-29 DIAGNOSIS — I110 Hypertensive heart disease with heart failure: Secondary | ICD-10-CM | POA: Diagnosis present

## 2018-10-29 DIAGNOSIS — Z981 Arthrodesis status: Secondary | ICD-10-CM

## 2018-10-29 DIAGNOSIS — Z9049 Acquired absence of other specified parts of digestive tract: Secondary | ICD-10-CM

## 2018-10-29 DIAGNOSIS — M1990 Unspecified osteoarthritis, unspecified site: Secondary | ICD-10-CM | POA: Diagnosis present

## 2018-10-29 DIAGNOSIS — E119 Type 2 diabetes mellitus without complications: Secondary | ICD-10-CM

## 2018-10-29 DIAGNOSIS — I10 Essential (primary) hypertension: Secondary | ICD-10-CM | POA: Diagnosis not present

## 2018-10-29 DIAGNOSIS — Z833 Family history of diabetes mellitus: Secondary | ICD-10-CM

## 2018-10-29 DIAGNOSIS — R0902 Hypoxemia: Secondary | ICD-10-CM

## 2018-10-29 DIAGNOSIS — E538 Deficiency of other specified B group vitamins: Secondary | ICD-10-CM | POA: Diagnosis present

## 2018-10-29 DIAGNOSIS — Z66 Do not resuscitate: Secondary | ICD-10-CM | POA: Diagnosis not present

## 2018-10-29 DIAGNOSIS — K449 Diaphragmatic hernia without obstruction or gangrene: Secondary | ICD-10-CM | POA: Diagnosis present

## 2018-10-29 DIAGNOSIS — Z8673 Personal history of transient ischemic attack (TIA), and cerebral infarction without residual deficits: Secondary | ICD-10-CM

## 2018-10-29 DIAGNOSIS — R0602 Shortness of breath: Secondary | ICD-10-CM

## 2018-10-29 DIAGNOSIS — D649 Anemia, unspecified: Secondary | ICD-10-CM | POA: Diagnosis not present

## 2018-10-29 DIAGNOSIS — J181 Lobar pneumonia, unspecified organism: Secondary | ICD-10-CM | POA: Diagnosis not present

## 2018-10-29 DIAGNOSIS — E860 Dehydration: Secondary | ICD-10-CM | POA: Diagnosis present

## 2018-10-29 DIAGNOSIS — Z90710 Acquired absence of both cervix and uterus: Secondary | ICD-10-CM

## 2018-10-29 DIAGNOSIS — J189 Pneumonia, unspecified organism: Principal | ICD-10-CM | POA: Diagnosis present

## 2018-10-29 DIAGNOSIS — Z886 Allergy status to analgesic agent status: Secondary | ICD-10-CM | POA: Diagnosis not present

## 2018-10-29 DIAGNOSIS — J9601 Acute respiratory failure with hypoxia: Secondary | ICD-10-CM

## 2018-10-29 DIAGNOSIS — I5031 Acute diastolic (congestive) heart failure: Secondary | ICD-10-CM | POA: Diagnosis not present

## 2018-10-29 DIAGNOSIS — F0390 Unspecified dementia without behavioral disturbance: Secondary | ICD-10-CM | POA: Diagnosis present

## 2018-10-29 DIAGNOSIS — C9110 Chronic lymphocytic leukemia of B-cell type not having achieved remission: Secondary | ICD-10-CM | POA: Diagnosis not present

## 2018-10-29 DIAGNOSIS — I480 Paroxysmal atrial fibrillation: Secondary | ICD-10-CM | POA: Diagnosis present

## 2018-10-29 DIAGNOSIS — N179 Acute kidney failure, unspecified: Secondary | ICD-10-CM | POA: Diagnosis present

## 2018-10-29 DIAGNOSIS — Z794 Long term (current) use of insulin: Secondary | ICD-10-CM | POA: Diagnosis not present

## 2018-10-29 DIAGNOSIS — D638 Anemia in other chronic diseases classified elsewhere: Secondary | ICD-10-CM | POA: Diagnosis present

## 2018-10-29 DIAGNOSIS — G92 Toxic encephalopathy: Secondary | ICD-10-CM | POA: Diagnosis not present

## 2018-10-29 DIAGNOSIS — R4182 Altered mental status, unspecified: Secondary | ICD-10-CM | POA: Diagnosis present

## 2018-10-29 DIAGNOSIS — Z87891 Personal history of nicotine dependence: Secondary | ICD-10-CM | POA: Diagnosis not present

## 2018-10-29 DIAGNOSIS — Z96641 Presence of right artificial hip joint: Secondary | ICD-10-CM | POA: Diagnosis not present

## 2018-10-29 DIAGNOSIS — K219 Gastro-esophageal reflux disease without esophagitis: Secondary | ICD-10-CM | POA: Diagnosis present

## 2018-10-29 DIAGNOSIS — I5033 Acute on chronic diastolic (congestive) heart failure: Secondary | ICD-10-CM | POA: Diagnosis not present

## 2018-10-29 DIAGNOSIS — Z515 Encounter for palliative care: Secondary | ICD-10-CM | POA: Diagnosis not present

## 2018-10-29 DIAGNOSIS — F419 Anxiety disorder, unspecified: Secondary | ICD-10-CM | POA: Diagnosis present

## 2018-10-29 DIAGNOSIS — Z7902 Long term (current) use of antithrombotics/antiplatelets: Secondary | ICD-10-CM

## 2018-10-29 DIAGNOSIS — R05 Cough: Secondary | ICD-10-CM | POA: Diagnosis not present

## 2018-10-29 HISTORY — DX: Other ill-defined heart diseases: I51.89

## 2018-10-29 HISTORY — DX: Pneumonia, unspecified organism: J18.9

## 2018-10-29 LAB — URINALYSIS, ROUTINE W REFLEX MICROSCOPIC
Bacteria, UA: NONE SEEN
Bilirubin Urine: NEGATIVE
GLUCOSE, UA: NEGATIVE mg/dL
Hgb urine dipstick: NEGATIVE
Ketones, ur: NEGATIVE mg/dL
Nitrite: NEGATIVE
Protein, ur: 30 mg/dL — AB
Specific Gravity, Urine: 1.031 — ABNORMAL HIGH (ref 1.005–1.030)
pH: 5 (ref 5.0–8.0)

## 2018-10-29 LAB — CBC WITH DIFFERENTIAL/PLATELET
Abs Immature Granulocytes: 0 10*3/uL (ref 0.00–0.07)
Basophils Absolute: 1.3 10*3/uL — ABNORMAL HIGH (ref 0.0–0.1)
Basophils Relative: 1 %
Blasts: 7 %
Eosinophils Absolute: 0 10*3/uL (ref 0.0–0.5)
Eosinophils Relative: 0 %
HCT: 29 % — ABNORMAL LOW (ref 36.0–46.0)
Hemoglobin: 8.6 g/dL — ABNORMAL LOW (ref 12.0–15.0)
Lymphocytes Relative: 63 %
Lymphs Abs: 79.6 10*3/uL — ABNORMAL HIGH (ref 0.7–4.0)
MCH: 25.4 pg — AB (ref 26.0–34.0)
MCHC: 29.7 g/dL — ABNORMAL LOW (ref 30.0–36.0)
MCV: 85.8 fL (ref 80.0–100.0)
Monocytes Absolute: 22.8 10*3/uL — ABNORMAL HIGH (ref 0.1–1.0)
Monocytes Relative: 18 %
Neutro Abs: 13.9 10*3/uL — ABNORMAL HIGH (ref 1.7–7.7)
Neutrophils Relative %: 11 %
Platelets: 388 10*3/uL (ref 150–400)
RBC: 3.38 MIL/uL — ABNORMAL LOW (ref 3.87–5.11)
RDW: 16.9 % — ABNORMAL HIGH (ref 11.5–15.5)
WBC: 126.4 10*3/uL (ref 4.0–10.5)
nRBC: 0 % (ref 0.0–0.2)
nRBC: 0 /100 WBC

## 2018-10-29 LAB — PATHOLOGIST SMEAR REVIEW

## 2018-10-29 LAB — COMPREHENSIVE METABOLIC PANEL
ALK PHOS: 234 U/L — AB (ref 38–126)
ALT: 20 U/L (ref 0–44)
AST: 22 U/L (ref 15–41)
Albumin: 2.9 g/dL — ABNORMAL LOW (ref 3.5–5.0)
Anion gap: 15 (ref 5–15)
BUN: 31 mg/dL — ABNORMAL HIGH (ref 8–23)
CALCIUM: 8.7 mg/dL — AB (ref 8.9–10.3)
CO2: 21 mmol/L — AB (ref 22–32)
Chloride: 98 mmol/L (ref 98–111)
Creatinine, Ser: 1.57 mg/dL — ABNORMAL HIGH (ref 0.44–1.00)
GFR calc Af Amer: 36 mL/min — ABNORMAL LOW (ref >60)
GFR calc non Af Amer: 31 mL/min — ABNORMAL LOW (ref >60)
Glucose, Bld: 154 mg/dL — ABNORMAL HIGH (ref 70–99)
Potassium: 4.7 mmol/L (ref 3.5–5.1)
SODIUM: 134 mmol/L — AB (ref 135–145)
Total Bilirubin: 1.1 mg/dL (ref 0.3–1.2)
Total Protein: 7.8 g/dL (ref 6.5–8.1)

## 2018-10-29 LAB — I-STAT CG4 LACTIC ACID, ED
Lactic Acid, Venous: 2.82 mmol/L (ref 0.5–1.9)
Lactic Acid, Venous: 2.05 mmol/L (ref 0.5–1.9)

## 2018-10-29 LAB — GLUCOSE, CAPILLARY
Glucose-Capillary: 133 mg/dL — ABNORMAL HIGH (ref 70–99)
Glucose-Capillary: 166 mg/dL — ABNORMAL HIGH (ref 70–99)

## 2018-10-29 LAB — STREP PNEUMONIAE URINARY ANTIGEN: Strep Pneumo Urinary Antigen: NEGATIVE

## 2018-10-29 MED ORDER — CITALOPRAM HYDROBROMIDE 20 MG PO TABS
20.0000 mg | ORAL_TABLET | Freq: Every day | ORAL | Status: DC
  Administered 2018-10-30 – 2018-11-01 (×3): 20 mg via ORAL
  Filled 2018-10-29 (×3): qty 1

## 2018-10-29 MED ORDER — SODIUM CHLORIDE 0.9 % IV BOLUS
500.0000 mL | Freq: Once | INTRAVENOUS | Status: AC
  Administered 2018-10-29: 500 mL via INTRAVENOUS

## 2018-10-29 MED ORDER — LORAZEPAM 0.5 MG PO TABS
0.2500 mg | ORAL_TABLET | Freq: Once | ORAL | Status: AC
  Administered 2018-10-29: 0.25 mg via ORAL
  Filled 2018-10-29: qty 1

## 2018-10-29 MED ORDER — RISPERIDONE 0.5 MG PO TABS
0.5000 mg | ORAL_TABLET | Freq: Every day | ORAL | Status: DC
  Administered 2018-10-30 – 2018-11-01 (×3): 0.5 mg via ORAL
  Filled 2018-10-29 (×3): qty 1

## 2018-10-29 MED ORDER — INSULIN ASPART 100 UNIT/ML ~~LOC~~ SOLN: 0.0000 [IU] | Freq: Every day | SUBCUTANEOUS | Status: DC

## 2018-10-29 MED ORDER — SODIUM CHLORIDE 0.9 % IV SOLN
INTRAVENOUS | Status: AC
  Administered 2018-10-29: 22:00:00 via INTRAVENOUS

## 2018-10-29 MED ORDER — TRAMADOL HCL 50 MG PO TABS
50.0000 mg | ORAL_TABLET | Freq: Four times a day (QID) | ORAL | Status: DC | PRN
  Filled 2018-10-29: qty 1

## 2018-10-29 MED ORDER — LORAZEPAM 0.5 MG PO TABS
0.2500 mg | ORAL_TABLET | Freq: Every day | ORAL | Status: DC
  Administered 2018-10-30 – 2018-11-01 (×3): 0.25 mg via ORAL
  Filled 2018-10-29 (×3): qty 1

## 2018-10-29 MED ORDER — OXYBUTYNIN CHLORIDE ER 10 MG PO TB24
10.0000 mg | ORAL_TABLET | Freq: Every day | ORAL | Status: DC
  Administered 2018-10-29 – 2018-10-31 (×3): 10 mg via ORAL
  Filled 2018-10-29 (×4): qty 1

## 2018-10-29 MED ORDER — PRAVASTATIN SODIUM 40 MG PO TABS
80.0000 mg | ORAL_TABLET | Freq: Every day | ORAL | Status: DC
  Administered 2018-10-29 – 2018-10-31 (×3): 80 mg via ORAL
  Filled 2018-10-29 (×4): qty 2

## 2018-10-29 MED ORDER — AMLODIPINE BESYLATE 5 MG PO TABS: 5.0000 mg | ORAL_TABLET | Freq: Every day | ORAL | Status: DC

## 2018-10-29 MED ORDER — ALLOPURINOL 100 MG PO TABS: 100.0000 mg | ORAL_TABLET | Freq: Two times a day (BID) | ORAL | Status: DC

## 2018-10-29 MED ORDER — TRAZODONE HCL 50 MG PO TABS
50.0000 mg | ORAL_TABLET | Freq: Every day | ORAL | Status: DC
  Administered 2018-10-29 – 2018-10-31 (×3): 50 mg via ORAL
  Filled 2018-10-29 (×4): qty 1

## 2018-10-29 MED ORDER — FAMOTIDINE 20 MG PO TABS
10.0000 mg | ORAL_TABLET | Freq: Every day | ORAL | Status: DC
  Administered 2018-10-29 – 2018-10-31 (×3): 10 mg via ORAL
  Filled 2018-10-29 (×4): qty 1

## 2018-10-29 MED ORDER — PRAMIPEXOLE DIHYDROCHLORIDE 0.25 MG PO TABS
0.2500 mg | ORAL_TABLET | Freq: Every day | ORAL | Status: DC
  Administered 2018-10-29 – 2018-10-31 (×3): 0.25 mg via ORAL
  Filled 2018-10-29 (×4): qty 1

## 2018-10-29 MED ORDER — SODIUM CHLORIDE 0.9 % IV SOLN
2.0000 g | INTRAVENOUS | Status: DC
  Administered 2018-10-29 – 2018-11-01 (×4): 2 g via INTRAVENOUS
  Filled 2018-10-29 (×4): qty 20

## 2018-10-29 MED ORDER — CLOPIDOGREL BISULFATE 75 MG PO TABS
75.0000 mg | ORAL_TABLET | Freq: Every day | ORAL | Status: DC
  Administered 2018-10-30 – 2018-10-31 (×2): 75 mg via ORAL
  Filled 2018-10-29 (×2): qty 1

## 2018-10-29 MED ORDER — INSULIN GLARGINE 100 UNIT/ML ~~LOC~~ SOLN
25.0000 [IU] | Freq: Every day | SUBCUTANEOUS | Status: DC
  Administered 2018-10-29 – 2018-11-01 (×4): 25 [IU] via SUBCUTANEOUS
  Filled 2018-10-29 (×4): qty 0.25

## 2018-10-29 MED ORDER — INSULIN ASPART 100 UNIT/ML ~~LOC~~ SOLN
0.0000 [IU] | Freq: Three times a day (TID) | SUBCUTANEOUS | Status: DC
  Administered 2018-10-29 – 2018-11-01 (×4): 2 [IU] via SUBCUTANEOUS

## 2018-10-29 MED ORDER — PANTOPRAZOLE SODIUM 40 MG PO TBEC: 40.0000 mg | DELAYED_RELEASE_TABLET | Freq: Every day | ORAL | Status: DC

## 2018-10-29 MED ORDER — SODIUM CHLORIDE 0.9 % IV SOLN
INTRAVENOUS | Status: AC
  Administered 2018-10-29: 19:00:00 via INTRAVENOUS

## 2018-10-29 MED ORDER — SODIUM CHLORIDE 0.9 % IV SOLN: INTRAVENOUS | Status: DC

## 2018-10-29 MED ORDER — ONDANSETRON HCL 4 MG PO TABS: 8.0000 mg | ORAL_TABLET | Freq: Three times a day (TID) | ORAL | Status: DC | PRN

## 2018-10-29 MED ORDER — ENOXAPARIN SODIUM 40 MG/0.4ML ~~LOC~~ SOLN
40.0000 mg | SUBCUTANEOUS | Status: DC
  Administered 2018-10-29 – 2018-10-30 (×2): 40 mg via SUBCUTANEOUS
  Filled 2018-10-29 (×2): qty 0.4

## 2018-10-29 MED ORDER — SODIUM CHLORIDE 0.9 % IV SOLN
500.0000 mg | INTRAVENOUS | Status: DC
  Administered 2018-10-29 – 2018-11-01 (×4): 500 mg via INTRAVENOUS
  Filled 2018-10-29 (×4): qty 500

## 2018-10-29 NOTE — H&P (Addendum)
History and Physical    Theresa Kramer UVO:[id number] DOB: 1941/02/15 DOA: 10/31/2018  PCP: Asencion Noble, MD Patient coming from: Home  Chief Complaint: Cough, fever  HPI: Theresa Kramer is a 77 y.o. female with medical history significant of CLL, Dementia, h/o stroke, HTN, HLD, DM2, GERD, Depression, agitation, B12 deficiency, arthritis who presents for AMS and cough.  Theresa Kramer has dementia and is cared for by her daughter Theresa Kramer.  On 12/24, Theresa Kramer noted a dry cough in her mother.  This commonly occurs in the winter, however, over the next 2 days it developed into a productive cough Ms. Berzins developed a temperature of 100F.  She was also noted to be a little bit more confused.  She presented to UC on 12/26 and was felt to have a URI and was given a course of amoxicillin.  Today, she became more restless, more confused and she presented back to UC and was told to come to the ED.  Further symptoms include increased need to urinate, patient is asking to go to the bathroom ever 5 minutes, but only peeing 1 out of 5 times.  She reports no dysuria, no chest pain, no wheezing, no chills.  Her daughter checked for a stroke per her, and felt that she has had no neurological changes.   ED Course: In the ED, Theresa Kramer was found to be newly hypoxic and requiring Oxygen at 2L to keep O2 sats > 90%.  She was having a cough and was restless.  She was also noted to be tachycardic.  She has CLL and is due to start treatment in January and her WBC has increased from 80 to the 120s.  CXR showed a possible developing pneumonia.   Review of Systems: As per HPI otherwise 10 point review of systems negative.   Past Medical History:  Diagnosis Date  . Arthritis   . B12 deficiency   . Bronchitis    hx of  . Cancer ([place])   . Complication of anesthesia   . Depression    "not taking medication"  . Diabetes mellitus   . GERD (gastroesophageal reflux disease)   . H/O hiatal hernia   . Hyperlipidemia   . Hypertension    . Noncompliance with medication regimen   . Pinched cervical nerve root   . PONV (postoperative nausea and vomiting)   . Stroke ([place])   . Urinary tract infection    hx of    Past Surgical History:  Procedure Laterality Date  . ABDOMINAL HYSTERECTOMY    . ANTERIOR CERVICAL DECOMP/DISCECTOMY FUSION  03/15/2012   Procedure: ANTERIOR CERVICAL DECOMPRESSION/DISCECTOMY FUSION 2 LEVELS;  Surgeon: Ophelia Charter, MD;  Location: [facility];  Service: Neurosurgery;  Laterality: N/A;  Cervical four-five Cervical five six anterior cervical decompression with fusion interbody prothesis plating and bonegraft  . APPENDECTOMY    . BIOPSY  05/18/2017   Procedure: BIOPSY;  Surgeon: Daneil Dolin, MD;  [facility];  Service: Endoscopy;;  gastric bx's  . BONE CYST EXCISION     from lower back and foot  . CHOLECYSTECTOMY    . COLONOSCOPY  2011   Dr. Gala Romney: prep compromised exam. diverticulosis, hemorrhoids. next tcs 2021  . COLONOSCOPY WITH PROPOFOL N/A 05/18/2017   Procedure: COLONOSCOPY WITH PROPOFOL;  Surgeon: Daneil Dolin, MD;  [facility];  Service: Endoscopy;  Laterality: N/A;  1100  . DILATION AND CURETTAGE OF UTERUS    . ESOPHAGOGASTRODUODENOSCOPY N/A 09/19/2014  RMR: Focally dilated midesophagus with large esophageal diverticulum. Multiple distal rings dilated and disrupted as described above. Small hiatal hernia.   Marland Kitchen ESOPHAGOGASTRODUODENOSCOPY (EGD) WITH PROPOFOL N/A 05/18/2017   Procedure: ESOPHAGOGASTRODUODENOSCOPY (EGD) WITH PROPOFOL;  Surgeon: Daneil Dolin, MD;  [facility];  Service: Endoscopy;  Laterality: N/A;  . EXCISION METACARPAL MASS Left 03/03/2016   Procedure: EXCISION OF LEFT  DORSAL MASS OF EXTENSOR TENDON;  Surgeon: Milly Jakob, MD;  Location: [facility];  Service: Orthopedics;  Laterality: Left;  . EYE SURGERY     bilateral cataract surgery,   . HIP ARTHROPLASTY     right  . JOINT REPLACEMENT    . KNEE SURGERY      torn cartilage repair  . MALONEY DILATION N/A 09/19/2014   Procedure: Venia Minks DILATION;  Surgeon: Daneil Dolin, MD;  [facility];  Service: Endoscopy;  Laterality: N/A;  . ORTHOPEDIC SURGERY    . POLYPECTOMY  05/18/2017   Procedure: POLYPECTOMY;  Surgeon: Daneil Dolin, MD;  [facility];  Service: Endoscopy;;  polyp at hepatic flexure, descending colon  . ROTATOR CUFF REPAIR     "multiple in both shoulders" and total shoulder replacement in left arm  . SAVORY DILATION N/A 09/19/2014   Procedure: SAVORY DILATION;  Surgeon: Daneil Dolin, MD;  [facility];  Service: Endoscopy;  Laterality: N/A;  . TONSILLECTOMY       reports that she quit smoking about 44 years ago. She has never used smokeless tobacco. She reports that she does not drink alcohol or use drugs.  Allergies  Allergen Reactions  . Meloxicam Other (See Comments)    Stomach pains    Family History  Problem Relation Age of Onset  . Cirrhosis Brother        etoh  . Diabetes Mellitus II Father   . Diabetes Mellitus II Sister   . Colon cancer Neg Hx     Prior to Admission medications   Medication Sig Start Date End Date Taking? Authorizing Provider  acetaminophen (TYLENOL ARTHRITIS PAIN) 650 MG CR tablet Take 1,300 mg by mouth 2 (two) times daily.    Yes [provider]  amLODipine (NORVASC) 5 MG tablet Take 5 mg by mouth at bedtime.    Yes [provider]  amoxicillin (AMOXIL) 875 MG tablet Take 1 tablet (875 mg total) by mouth 2 (two) times daily. 10/28/18  Yes Raylene Everts, MD  benzonatate (TESSALON) 100 MG capsule Take 1 capsule (100 mg total) by mouth every 8 (eight) hours. 10/28/18  Yes Raylene Everts, MD  citalopram (CELEXA) 20 MG tablet Take 20 mg by mouth daily.     Yes [provider]  clopidogrel (PLAVIX) 75 MG tablet TAKE 1 TABLET BY MOUTH  DAILY Patient taking differently: Take 75 mg by mouth daily.  12/18/17  Yes Rosalin Hawking, MD    cyanocobalamin (,VITAMIN B-12,) 1000 MCG/ML injection Inject 1,000 mcg into the muscle every 30 (thirty) days. Usually on the 1st    Yes [provider]  insulin glargine (LANTUS) 100 UNIT/ML injection Inject 45 Units into the skin at bedtime.    Yes [provider]  LORazepam (ATIVAN) 0.5 MG tablet Take 0.25 mg by mouth daily. Breakfast   Yes [provider]  metFORMIN (GLUCOPHAGE) 1000 MG tablet Take 500 mg by mouth 2 (two) times daily with a meal. After lunch and bedtime   Yes [provider]  oxybutynin (DITROPAN-XL) 10  MG 24 hr tablet Take 10 mg by mouth at bedtime.    Yes [provider]  pantoprazole (PROTONIX) 40 MG tablet Take 40 mg by mouth daily.   Yes [provider]  pramipexole (MIRAPEX) 0.25 MG tablet Take 0.25 mg by mouth at bedtime.    Yes [provider]  pravastatin (PRAVACHOL) 80 MG tablet Take 1 tablet (80 mg total) by mouth daily. Patient taking differently: Take 80 mg by mouth at bedtime.  08/01/16  Yes Kelvin Cellar, MD  ranitidine (ZANTAC) 150 MG tablet Take 150 mg by mouth at bedtime.   Yes [provider]  risperiDONE (RISPERDAL) 0.5 MG tablet Take 0.5 mg by mouth daily. Breakfast   Yes [provider]  traMADol (ULTRAM) 50 MG tablet Take 1 tablet (50 mg total) by mouth every 6 (six) hours as needed for moderate pain. 08/04/16  Yes Sheikh, Omair Latif, DO  traZODone (DESYREL) 50 MG tablet Take 50 mg by mouth at bedtime.   Yes [provider]  valsartan-hydrochlorothiazide (DIOVAN-HCT) 160-25 MG tablet Take 1 tablet by mouth daily.   Yes [provider]  allopurinol (ZYLOPRIM) 100 MG tablet Take 1 tablet (100 mg total) by mouth 2 (two) times daily. 10/15/18   Brunetta Genera, MD  ondansetron (ZOFRAN) 8 MG tablet Take 1 tablet (8 mg total) by mouth every 8 (eight) hours as needed for nausea or vomiting. 10/15/18   Brunetta Genera, MD  venetoclax 10 MG TABS Take 20  mg by mouth daily. Take with food and a glass of water. Take for days 1-14 of 1st cycle. 10/13/18   Brunetta Genera, MD  Venetoclax 50 MG TABS Take 50 mg by mouth daily. Take with food and a glass of water. Take on days 15-28 of 1st cycle. 10/13/18   Brunetta Genera, MD    Physical Exam: Vitals:   10/21/2018 5784 10/28/2018 0923 10/11/2018 1030  BP: 122/61  117/63  Pulse: (!) 116  (!) 116  Resp: 20  (!) 29  Temp: 98.2 F (36.8 C)    TempSrc: Oral    SpO2: (!) 88% 93% 93%    Constitutional: Lying in bed, alert, very restless Vitals:   10/18/2018 0921 10/20/2018 0923 10/21/2018 1030  BP: 122/61  117/63  Pulse: (!) 116  (!) 116  Resp: 20  (!) 29  Temp: 98.2 F (36.8 C)    TempSrc: Oral    SpO2: (!) 88% 93% 93%   Eyes: PERRL, lids and conjunctivae normal, EOMI ENMT: Mucous membranes are somewhat dry Neck: normal, supple, no masses, no carotid bruit Respiratory: She has rales/crackles at left base, otherwise clear, no wheezing Cardiovascular: Tachycardic, regular, no murmurs noted Abdomen: +BS, NT, ND, no splenomegaly (reported from daughter) Musculoskeletal: no clubbing / cyanosis. She has normal muscle tone for age, no swelling of one extremity Skin: no rashes, lesions, ulcers on exposed skin, no excessive bruising.  Neurologic: CN 2-12 grossly intact. Sensation intact to light touch,  Strength 5/5 in all 4 and she is moving spontaneously.  She is oriented to person, place, time. She was confused by president, initially said Trump and then [place].   Psychiatric: Somewhat confused, worse than baseline per family.  Anxious mood.   Labs on Admission: I have personally reviewed following labs and imaging studies  CBC: Recent Labs  Lab 10/11/2018 0941  WBC 126.4*  NEUTROABS 13.9*  HGB 8.6*  HCT 29.0*  MCV 85.8  PLT 696   Basic Metabolic Panel: Recent  Labs  Lab 10/08/2018 0941  NA 134*  K 4.7  CL 98  CO2 21*  GLUCOSE 154*  BUN 31*  CREATININE 1.57*  CALCIUM 8.7*    GFR: CrCl cannot be calculated (Unknown ideal weight.). Liver Function Tests: Recent Labs  Lab 10/27/2018 0941  AST 22  ALT 20  ALKPHOS 234*  BILITOT 1.1  PROT 7.8  ALBUMIN 2.9*   No results for input(s): LIPASE, AMYLASE in the last 168 hours. No results for input(s): AMMONIA in the last 168 hours. Coagulation Profile: No results for input(s): INR, PROTIME in the last 168 hours. Cardiac Enzymes: No results for input(s): CKTOTAL, CKMB, CKMBINDEX, TROPONINI in the last 168 hours. BNP (last 3 results) No results for input(s): PROBNP in the last 8760 hours. HbA1C: No results for input(s): HGBA1C in the last 72 hours. CBG: No results for input(s): GLUCAP in the last 168 hours. Lipid Profile: No results for input(s): CHOL, HDL, LDLCALC, TRIG, CHOLHDL, LDLDIRECT in the last 72 hours. Thyroid Function Tests: No results for input(s): TSH, T4TOTAL, FREET4, T3FREE, THYROIDAB in the last 72 hours. Anemia Panel: No results for input(s): VITAMINB12, FOLATE, FERRITIN, TIBC, IRON, RETICCTPCT in the last 72 hours. Urine analysis:    Component Value Date/Time   COLORURINE AMBER (A) 10/07/2018 1252   APPEARANCEUR HAZY (A) 10/20/2018 1252   LABSPEC 1.031 (H) 10/09/2018 1252   PHURINE 5.0 10/28/2018 1252   GLUCOSEU NEGATIVE 10/26/2018 1252   HGBUR NEGATIVE 10/06/2018 1252   BILIRUBINUR NEGATIVE 10/18/2018 1252   KETONESUR NEGATIVE 10/05/2018 1252   PROTEINUR 30 (A) 10/14/2018 1252   UROBILINOGEN 0.2 11/10/2011 1435   NITRITE NEGATIVE 10/18/2018 1252   LEUKOCYTESUR TRACE (A) 10/14/2018 1252    Radiological Exams on Admission: Dg Chest 2 View  Result Date: 10/24/2018 CLINICAL DATA:  Cough and shortness of breath EXAM: CHEST - 2 VIEW COMPARISON:  December 21, 2017 FINDINGS: There is atelectatic change in the lateral left base. The lungs elsewhere are clear. Heart size and pulmonary vascularity are normal. No adenopathy. Postoperative changes noted in the lower cervical spine. Total  shoulder replacement noted on the left. There is aortic atherosclerosis. IMPRESSION: Atelectasis lateral left base. This area may represent earliest changes of pneumonia. Lungs elsewhere clear. No adenopathy appreciable. There is aortic atherosclerosis. Aortic Atherosclerosis (ICD10-I70.0). Electronically Signed   By: Lowella Grip III M.D.   On: 10/06/2018 10:14    EKG: Independently reviewed. Irregular, but sinus.  PVCs  Assessment/Plan CAP (community acquired pneumonia), acute hypoxic respiratory failure (new requirement of oxygen) - I think infection is likely.  Her exam is concerning for LLL crackles, which is consistent with her CXR findings.  Her symptoms of confusion, low grade temperature, elevated WBC would be consistent with pneumonia.  She was started on treatment for CAP with Ceftriaxone and Azithromycin in the ED.  She does have tachycardia and hypoxia and given her diagnosis of CLL, would give her a moderate score on the Well's. I do not think a D dimer would be helpful with her CLL diagnosis - Oxygen as needed to keep sats > 90% - BC X 2 pending, sputum culture if she produces sputum - Azithromycin and Ceftriaxone per pharmacy - VQ scan to rule out PE (AKI currently, so CT scan deferred) - Urinary antigens for strep and legionella - NS at 75 cc/hr for 10 hours ordered initially, but BP has been on the low side, so will increase to 125cc/hr for 5 hours followed by 75cc/hr for 5 hours.  (TTE  2 years ago showed normal EF)  Dysuria - Patient with increased urgency, inability to urinate much.  Patient's family was concerned for UTI - UA and UC pending - H/O Klebsiella UTIs which were resistant to cephalosporins.  If UA or UC +, antibiotics will need to be changed  AKI - Renal function doubled - IVF as noted above - Consider bladder scan if patient not producing urine, check for outlet obstruction - Ins and outs  CLL (chronic lymphocytic leukemia) - She has a new elevated WBC  to the 120s with 7 blasts on the peripheral smear.  Pathologist review noted that this was consistent with previous CLL.  This would not be consistent with blast crisis (> 20% blasts).  Also, based on my review and ED discussion with oncology, leukostasis would not be an issue until WBC > 400 (250 per oncology) - Monitor WBC daily, smear daily - Discuss with Oncology further if needed - Treat infections as noted above - Start treatment for CLL planned for January.     GERD - She is on a PPI and Zantac per chart review.  She is also on plavix.  PPI was held at this time, but can likely be continued outpatient.     Type 2 diabetes mellitus - Start lantus at half home dose given illness, 25 units - SSI - Increase lantus as needed to home dose - Hold metformin given AKI    Essential hypertension - BP low normal right now, holding renally dosed medications - Hold home meds until BP normalizes.   H/O stroke - Continue plavix, good blood pressure control    Normocytic anemia - Likely related to CLL, trend CBC    HLD (hyperlipidemia) - Continue statin  Dementia - She is on quite a few medications for anxiety, at very low doses.  I continued them at this time.  Would hold them if AMS gets worse.       DVT prophylaxis: Lovenox Code Status: Full, confirmed with daughter Family Communication: Theresa Kramer, daughter, Chauncey Reading, will bring in paperwork Disposition Plan: Admit for acute infection Consults called: ED reports speaking with Oncology, unclear if formal consult Admission status: Inpatient, telemetry  Gilles Chiquito MD [facility] Pager [phone]  If 7PM-7AM, please contact night-coverage [email or web address] Password [facility]  10/25/2018, 2:58 PM

## 2018-10-29 NOTE — Progress Notes (Signed)
Informed Dr. Daryll Drown that due to patient being restless on the radiology table Nuc Med is unable to get scan preformed. MD to place orders.

## 2018-10-29 NOTE — Progress Notes (Addendum)
PHINLEY SCHALL [id number] Admission Data: 10/11/2018 4:48 PM Attending Provider: Sid Falcon, MD  HWE:[id number], Carloyn Manner, MD Consults/ Treatment Team:   TANGIE STAY is a 77 y.o. female patient admitted from ED awake, alert  & orientated  X 1,  Full Code, VSS - Blood pressure 96/66, pulse (!) 121, temperature 97.8 F (36.6 C), resp. rate (!) 29, SpO2 93 %., O2    2 L nasal cannular, no c/o shortness of breath, no c/o chest pain, no distress noted. Tele # [phone] placed and pt is currently running:sinus tachycardia.  Allergies:   Allergies  Allergen Reactions  . Meloxicam Other (See Comments)    Stomach pains     Past Medical History:  Diagnosis Date  . Arthritis   . B12 deficiency   . Bronchitis    hx of  . Cancer ([place])   . CAP (community acquired pneumonia) 10/27/2018  . Complication of anesthesia   . Depression    "not taking medication"  . Diabetes mellitus   . GERD (gastroesophageal reflux disease)   . H/O hiatal hernia   . Hyperlipidemia   . Hypertension   . Noncompliance with medication regimen   . Pinched cervical nerve root   . PONV (postoperative nausea and vomiting)   . Stroke ([place])   . Urinary tract infection    hx of    Pt orientation to unit, room and routine. Information packet given to patient/family and safety video watched.  Admission INP armband ID verified with patient/family, and in place. SR up x 2, fall risk assessment complete with Patient and family verbalizing understanding of risks associated with falls. Family verbalizes an understanding of how to use the call bell and to call for help before getting out of bed.  Skin, clean-dry- intact without evidence of bruising, or skin tears.   No evidence of skin break down noted on exam.   Will cont to monitor and assist as needed.  [place] N Bocephus Cali, RN 10/10/2018 4:48 PM

## 2018-10-29 NOTE — Progress Notes (Signed)
Patient off the unit once the patient returns RN will re enter IV team consult

## 2018-10-29 NOTE — ED Notes (Signed)
I-stat lactic acid results reported to [place], Therapist, sports

## 2018-10-29 NOTE — ED Provider Notes (Signed)
Emergency Department Provider Note   I have reviewed the triage vital signs and the nursing notes.   HISTORY  Chief Complaint Urinary Tract Infection   HPI Theresa Kramer is a 77 y.o. female who presents with her daughter secondary to progressively worsening mental status.  Patient's daughter gives a history as the patient has significant dementia.  So that she had a few days of progressively worsening cough and to become productive with a fever as high as 100.4 at home.  She was urgent care yesterday was on amoxicillin but has had progressive decline in her mental status even beyond her normal dementia.  Daughter went back to urgent care today and they sent her here for further evaluation.  As far as the daughter knows the patient has not had any chest pain, abdominal pain, back pain, vomiting, diarrhea, nausea or rashes. No other associated or modifying symptoms.    Past Medical History:  Diagnosis Date  . Arthritis   . B12 deficiency   . Bronchitis    hx of  . Cancer ([place])   . CAP (community acquired pneumonia) 10/08/2018  . Complication of anesthesia   . Depression    "not taking medication"  . Diabetes mellitus   . GERD (gastroesophageal reflux disease)   . H/O hiatal hernia   . Hyperlipidemia   . Hypertension   . Noncompliance with medication regimen   . Pinched cervical nerve root   . PONV (postoperative nausea and vomiting)   . Stroke ([place])   . Urinary tract infection    hx of    Patient Active Problem List   Diagnosis Date Noted  . CAP (community acquired pneumonia) 10/07/2018  . Acute respiratory failure with hypoxia ([place])   . CLL (chronic lymphocytic leukemia) ([place]) 10/14/2018  . History of stroke 08/04/2017  . IDA (iron deficiency anemia) 03/25/2017  . Somnolence, daytime 12/31/2016  . Snoring 12/31/2016  . Occipital neuralgia   . Acute ischemic stroke ([place])   . Stroke ([place])   . 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 Leukocytosis 08/01/2016  . Urinary tract infectious disease 08/01/2016  . Type 2 diabetes mellitus without complication, with long-term current use of insulin ([place]) 07/30/2016  . Essential hypertension 07/30/2016  . Osteoarthritis 07/30/2016  . Normocytic anemia 07/30/2016  . HLD (hyperlipidemia)   . Schatzki's ring   . Cervical herniated disc 03/15/2012  . GERD 04/29/2010  . CHEST PAIN, ATYPICAL 04/29/2010  . DYSPHAGIA UNSPECIFIED 04/29/2010    Past Surgical History:  Procedure Laterality Date  . ABDOMINAL HYSTERECTOMY    . ANTERIOR CERVICAL DECOMP/DISCECTOMY FUSION  03/15/2012   Procedure: ANTERIOR CERVICAL DECOMPRESSION/DISCECTOMY FUSION 2 LEVELS;  Surgeon: Ophelia Charter, MD;  Location: [facility];  Service: Neurosurgery;  Laterality: N/A;  Cervical four-five Cervical five six anterior cervical decompression with fusion interbody prothesis plating and bonegraft  . APPENDECTOMY    . BIOPSY  05/18/2017   Procedure: BIOPSY;  Surgeon: Daneil Dolin, MD;  Location: [facility];  Service: Endoscopy;;  gastric bx's  . BONE CYST EXCISION     from lower back and foot  . CHOLECYSTECTOMY    . COLONOSCOPY  2011   Dr. Gala Romney: prep compromised exam. diverticulosis, hemorrhoids. next tcs 2021  . COLONOSCOPY WITH PROPOFOL N/A 05/18/2017   Procedure: COLONOSCOPY WITH PROPOFOL;  Surgeon: Daneil Dolin, MD;  [facility];  Service: Endoscopy;  Laterality: N/A;  1100  . DILATION AND CURETTAGE OF UTERUS    . 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 Multiple distal rings dilated and disrupted as described above. Small hiatal hernia.   Marland Kitchen ESOPHAGOGASTRODUODENOSCOPY (EGD) WITH PROPOFOL N/A 05/18/2017   Procedure: ESOPHAGOGASTRODUODENOSCOPY (EGD) WITH PROPOFOL;  Surgeon: Daneil Dolin, MD;  [facility];  Service: Endoscopy;  Laterality: N/A;  . EXCISION  METACARPAL MASS Left 03/03/2016   Procedure: EXCISION OF LEFT  DORSAL MASS OF EXTENSOR TENDON;  Surgeon: Milly Jakob, MD;  Location: [facility];  Service: Orthopedics;  Laterality: Left;  . EYE SURGERY     bilateral cataract surgery,   . HIP ARTHROPLASTY     right  . JOINT REPLACEMENT    . KNEE SURGERY     torn cartilage repair  . MALONEY DILATION N/A 09/19/2014   Procedure: Venia Minks DILATION;  Surgeon: Daneil Dolin, MD;  [facility];  Service: Endoscopy;  Laterality: N/A;  . ORTHOPEDIC SURGERY    . POLYPECTOMY  05/18/2017   Procedure: POLYPECTOMY;  Surgeon: Daneil Dolin, MD;  [facility];  Service: Endoscopy;;  polyp at hepatic flexure, descending colon  . ROTATOR CUFF REPAIR     "multiple in both shoulders" and total shoulder replacement in left arm  . SAVORY DILATION N/A 09/19/2014   Procedure: SAVORY DILATION;  Surgeon: Daneil Dolin, MD;  [facility];  Service: Endoscopy;  Laterality: N/A;  . TONSILLECTOMY        Allergies Meloxicam  Family History  Problem Relation Age of Onset  . Cirrhosis Brother        etoh  . Diabetes Mellitus II Father   . Diabetes Mellitus II Sister   . Colon cancer Neg Hx     Social History Social History   Tobacco Use  . Smoking status: Former Smoker    Last attempt to quit: 05/13/1974    Years since quitting: 44.4  . Smokeless tobacco: Never Used  . Tobacco comment: stopped 25 years ago  Substance Use Topics  . Alcohol use: No  . Drug use: No    Review of Systems  All other systems negative except as documented in the HPI. All pertinent positives and negatives as reviewed in the HPI. ____________________________________________   PHYSICAL EXAM:  VITAL SIGNS: ED Triage Vitals  Enc Vitals Group     BP 10/17/2018 0921 122/61     Pulse Rate 10/18/2018 0921 (!) 116     Resp 10/23/2018 0921 20     Temp 10/12/2018 0921 98.2 F (36.8 C)     Temp Source 10/20/2018 0921 Oral     SpO2  10/14/2018 0921 (!) 88 %    Constitutional: Alert and disoriented. Picking behavior. Eyes: Conjunctivae are normal. PERRL. EOMI. Head: Atraumatic. Nose: No congestion/rhinnorhea. Mouth/Throat: Mucous membranes are moist.  Oropharynx non-erythematous. Neck: No stridor.  No meningeal signs.   Cardiovascular: tachycardic rate, irregular rhythm. Good peripheral circulation. Grossly normal heart sounds.   Respiratory: tachycpneic respiratory effort.  No retractions. Lungs diminished LUL>RUL, crackles in bilateral bases. Gastrointestinal: Soft and nontender. No distention.  Musculoskeletal: No lower extremity tenderness nor edema. No gross deformities of extremities. Neurologic:  Normal speech and language. No gross focal neurologic deficits are appreciated.  Skin:  Skin is warm, dry and intact. No rash noted.   ____________________________________________   LABS (all labs ordered are listed, but only abnormal results are displayed)  Labs Reviewed  COMPREHENSIVE METABOLIC PANEL - Abnormal; Notable for the following components:      Result Value   Sodium 134 (*)    CO2 21 (*)    Glucose, Bld 154 (*)    BUN 31 (*)    Creatinine, Ser 1.57 (*)    Calcium 8.7 (*)    Albumin 2.9 (*)    Alkaline Phosphatase 234 (*)    GFR calc non Af Amer 31 (*)    GFR calc Af Amer 36 (*)    All other components within normal limits  CBC WITH DIFFERENTIAL/PLATELET - Abnormal; Notable for the following components:   WBC 126.4 (*)    RBC 3.38 (*)    Hemoglobin 8.6 (*)    HCT 29.0 (*)    MCH 25.4 (*)    MCHC 29.7 (*)    RDW 16.9 (*)    Neutro Abs 13.9 (*)    Lymphs Abs 79.6 (*)    Monocytes Absolute 22.8 (*)    Basophils Absolute 1.3 (*)    All other components within normal limits  URINALYSIS, ROUTINE W REFLEX MICROSCOPIC - Abnormal; Notable for the following components:   Color, Urine AMBER (*)    APPearance HAZY (*)    Specific Gravity, Urine 1.031 (*)    Protein, ur 30 (*)    Leukocytes, UA  TRACE (*)    All other components within normal limits  I-STAT CG4 LACTIC ACID, ED - Abnormal; Notable for the following components:   Lactic Acid, Venous 2.82 (*)    All other components within normal limits  I-STAT CG4 LACTIC ACID, ED - Abnormal; Notable for the following components:   Lactic Acid, Venous 2.05 (*)    All other components within normal limits  CULTURE, BLOOD (ROUTINE X 2)  CULTURE, BLOOD (ROUTINE X 2)  URINE CULTURE  EXPECTORATED SPUTUM ASSESSMENT W REFEX TO RESP CULTURE  GRAM STAIN  PATHOLOGIST SMEAR REVIEW  STREP PNEUMONIAE URINARY ANTIGEN  HIV ANTIBODY (ROUTINE TESTING W REFLEX)  LEGIONELLA PNEUMOPHILA SEROGP 1 UR AG  COMPREHENSIVE METABOLIC PANEL  CBC   ____________________________________________  EKG   EKG Interpretation  Date/Time:  Friday October 29 2018 69:67:89 EST Ventricular Rate:  122 PR Interval:    QRS Duration: 82 QT Interval:  320 QTC Calculation: 435 R Axis:   27 Text Interpretation:  Sinus tachycardia Multiform ventricular premature complexes Borderline repolarization abnormality more irregular rhythm than previous september 2017 Confirmed by Merrily Pew [phone]) on 10/19/2018 10:00:32 AM Also confirmed by Merrily Pew [phone]), editor Philomena Doheny [phone])  on 10/22/2018 11:00:30 AM       ____________________________________________  RADIOLOGY  Dg Chest 2 View  Result Date: 10/16/2018 CLINICAL DATA:  Cough and shortness of breath EXAM: CHEST - 2 VIEW COMPARISON:  December 21, 2017 FINDINGS: There is atelectatic change in the lateral left base. The lungs elsewhere are clear. Heart size and pulmonary vascularity are normal. No adenopathy. Postoperative changes noted in the lower cervical spine. Total shoulder replacement noted on the left. There is aortic atherosclerosis. IMPRESSION: Atelectasis lateral left base. This area may represent earliest changes of pneumonia. Lungs elsewhere clear. No adenopathy appreciable. There is aortic  atherosclerosis. Aortic Atherosclerosis (ICD10-I70.0). Electronically Signed   By: Lowella Grip III M.D.   On: 10/05/2018 10:14    ____________________________________________   PROCEDURES  Procedure(s) performed:   Procedures  CRITICAL CARE Performed by: Merrily Pew Total critical care time: 35 minutes Critical care time was exclusive of separately billable procedures and treating other patients.  Critical care was necessary to treat or prevent imminent or life-threatening deterioration. Critical care was time spent personally by me on the following activities: development of treatment plan with patient and/or surrogate as well as nursing, discussions with consultants, evaluation of patient's response to treatment, examination of patient, obtaining history from patient or surrogate, ordering and performing treatments and interventions, ordering and review of laboratory studies, ordering and review of radiographic studies, pulse oximetry and re-evaluation of patient's condition.  ____________________________________________   INITIAL IMPRESSION / ASSESSMENT AND PLAN / ED COURSE  Patient with questionable pneumonia on her chest x-ray however with the level of hypoxia, tachycardia tachypnea that she had consider also pulmonary embolus.  Discussed with hospitalist who will admit for consideration of VQ scan and antibiotics for likely pneumonia.    Pertinent labs & imaging results that were available during my care of the patient were reviewed by me and considered in my medical decision making (see chart for details).  ____________________________________________  FINAL CLINICAL IMPRESSION(S) / ED DIAGNOSES  Final diagnoses:  Hypoxia  Acute respiratory failure with hypoxia ([employer])  Community acquired pneumonia, unspecified laterality     MEDICATIONS GIVEN DURING THIS VISIT:  Medications  cefTRIAXone (ROCEPHIN) 2 g in sodium chloride 0.9 % 100 mL IVPB (0 g Intravenous  Stopped 10/23/2018 1413)  azithromycin (ZITHROMAX) 500 mg in sodium chloride 0.9 % 250 mL IVPB (0 mg Intravenous Stopped 10/24/2018 1225)  traMADol (ULTRAM) tablet 50 mg (has no administration in time range)  pravastatin (PRAVACHOL) tablet 80 mg (has no administration in time range)  citalopram (CELEXA) tablet 20 mg (has no administration in time range)  LORazepam (ATIVAN) tablet 0.25 mg (has no administration in time range)  risperiDONE (RISPERDAL) tablet 0.5 mg (has no administration in time range)  traZODone (DESYREL) tablet 50 mg (has no administration in time range)  insulin glargine (LANTUS) injection 25 Units (has no administration in time range)  ondansetron (ZOFRAN) tablet 8 mg (has no administration in time range)  famotidine (PEPCID) tablet 10 mg (has no administration in time range)  oxybutynin (DITROPAN-XL) 24 hr tablet 10 mg (has no administration in time range)  clopidogrel (PLAVIX) tablet 75 mg (has no administration in time range)  pramipexole (MIRAPEX) tablet 0.25 mg (has no administration in time range)  enoxaparin (LOVENOX) injection 40 mg (has no administration in time range)  0.9 %  sodium chloride infusion (has no administration in time range)  0.9 %  sodium chloride infusion (has no administration in time range)  LORazepam (ATIVAN) tablet 0.25 mg (has no administration in time range)  insulin aspart (novoLOG) injection 0-15 Units (has no administration in time range)  insulin aspart (novoLOG) injection 0-5 Units (has no administration in time range)  sodium chloride 0.9 % bolus 500 mL (0 mLs Intravenous Stopped 10/19/2018 1229)     NEW OUTPATIENT MEDICATIONS STARTED DURING THIS VISIT:  Current Discharge Medication List      Note:  This note was prepared with assistance of Dragon voice recognition software. Occasional wrong-word or sound-a-like substitutions may have occurred due to the inherent limitations of voice recognition software.   Merrily Pew, MD 10/18/2018  [phone]

## 2018-10-29 NOTE — ED Triage Notes (Signed)
Pt here from home with c/o aloc  With possible uti . Pt is on amoxicillin for an URI from an Urgent care yesterday

## 2018-10-30 DIAGNOSIS — K219 Gastro-esophageal reflux disease without esophagitis: Secondary | ICD-10-CM

## 2018-10-30 DIAGNOSIS — R0902 Hypoxemia: Secondary | ICD-10-CM

## 2018-10-30 DIAGNOSIS — J189 Pneumonia, unspecified organism: Principal | ICD-10-CM

## 2018-10-30 LAB — COMPREHENSIVE METABOLIC PANEL
ALT: 13 U/L (ref 0–44)
AST: 18 U/L (ref 15–41)
Albumin: 2.5 g/dL — ABNORMAL LOW (ref 3.5–5.0)
Alkaline Phosphatase: 188 U/L — ABNORMAL HIGH (ref 38–126)
Anion gap: 14 (ref 5–15)
BUN: 35 mg/dL — ABNORMAL HIGH (ref 8–23)
CHLORIDE: 100 mmol/L (ref 98–111)
CO2: 21 mmol/L — ABNORMAL LOW (ref 22–32)
Calcium: 8.3 mg/dL — ABNORMAL LOW (ref 8.9–10.3)
Creatinine, Ser: 1.33 mg/dL — ABNORMAL HIGH (ref 0.44–1.00)
GFR calc Af Amer: 44 mL/min — ABNORMAL LOW (ref >60)
GFR calc non Af Amer: 38 mL/min — ABNORMAL LOW (ref >60)
Glucose, Bld: 110 mg/dL — ABNORMAL HIGH (ref 70–99)
Potassium: 4.2 mmol/L (ref 3.5–5.1)
Sodium: 135 mmol/L (ref 135–145)
Total Bilirubin: 0.3 mg/dL (ref 0.3–1.2)
Total Protein: 7.3 g/dL (ref 6.5–8.1)

## 2018-10-30 LAB — CBC
HCT: 26.7 % — ABNORMAL LOW (ref 36.0–46.0)
Hemoglobin: 7.7 g/dL — ABNORMAL LOW (ref 12.0–15.0)
MCH: 24.4 pg — ABNORMAL LOW (ref 26.0–34.0)
MCHC: 28.8 g/dL — ABNORMAL LOW (ref 30.0–36.0)
MCV: 84.5 fL (ref 80.0–100.0)
Platelets: 371 10*3/uL (ref 150–400)
RBC: 3.16 MIL/uL — ABNORMAL LOW (ref 3.87–5.11)
RDW: 16.8 % — ABNORMAL HIGH (ref 11.5–15.5)
WBC: 148.5 10*3/uL (ref 4.0–10.5)
nRBC: 0 % (ref 0.0–0.2)

## 2018-10-30 LAB — BRAIN NATRIURETIC PEPTIDE: B Natriuretic Peptide: 1676.4 pg/mL — ABNORMAL HIGH (ref 0.0–100.0)

## 2018-10-30 LAB — GLUCOSE, CAPILLARY
Glucose-Capillary: 92 mg/dL (ref 70–99)
Glucose-Capillary: 82 mg/dL (ref 70–99)
Glucose-Capillary: 75 mg/dL (ref 70–99)
Glucose-Capillary: 125 mg/dL — ABNORMAL HIGH (ref 70–99)

## 2018-10-30 LAB — INFLUENZA PANEL BY PCR (TYPE A & B)
Influenza A By PCR: NEGATIVE
Influenza B By PCR: NEGATIVE

## 2018-10-30 LAB — HIV ANTIBODY (ROUTINE TESTING W REFLEX): HIV Screen 4th Generation wRfx: NONREACTIVE

## 2018-10-30 LAB — URINE CULTURE

## 2018-10-30 LAB — PREPARE RBC (CROSSMATCH)

## 2018-10-30 LAB — TSH: TSH: 2.788 u[IU]/mL (ref 0.350–4.500)

## 2018-10-30 MED ORDER — SODIUM CHLORIDE 0.9% IV SOLUTION: Freq: Once | INTRAVENOUS | Status: DC

## 2018-10-30 MED ORDER — ACETAMINOPHEN 325 MG PO TABS
650.0000 mg | ORAL_TABLET | Freq: Four times a day (QID) | ORAL | Status: DC | PRN
  Administered 2018-10-30: 650 mg via ORAL
  Filled 2018-10-30: qty 2

## 2018-10-30 MED ORDER — SODIUM CHLORIDE 0.9 % IV SOLN
INTRAVENOUS | Status: DC
  Administered 2018-10-30: 08:00:00 via INTRAVENOUS

## 2018-10-30 MED ORDER — FUROSEMIDE 10 MG/ML IJ SOLN: 40.0000 mg | Freq: Once | INTRAMUSCULAR | Status: DC

## 2018-10-30 MED ORDER — METOPROLOL TARTRATE 25 MG PO TABS
25.0000 mg | ORAL_TABLET | Freq: Two times a day (BID) | ORAL | Status: DC
  Administered 2018-10-30 – 2018-11-01 (×4): 25 mg via ORAL
  Filled 2018-10-30 (×4): qty 1

## 2018-10-30 MED ORDER — AMLODIPINE BESYLATE 10 MG PO TABS: 10.0000 mg | ORAL_TABLET | Freq: Every day | ORAL | Status: DC

## 2018-10-30 MED ORDER — FUROSEMIDE 10 MG/ML IJ SOLN
20.0000 mg | Freq: Once | INTRAMUSCULAR | Status: AC
  Administered 2018-10-30: 20 mg via INTRAVENOUS
  Filled 2018-10-30: qty 2

## 2018-10-30 MED ORDER — DIPHENHYDRAMINE HCL 50 MG/ML IJ SOLN: 25.0000 mg | Freq: Four times a day (QID) | INTRAMUSCULAR | Status: DC | PRN

## 2018-10-30 MED ORDER — HALOPERIDOL 2 MG PO TABS
2.0000 mg | ORAL_TABLET | Freq: Four times a day (QID) | ORAL | Status: DC | PRN
  Administered 2018-10-30 – 2018-11-01 (×4): 2 mg via ORAL
  Filled 2018-10-30 (×9): qty 1

## 2018-10-30 NOTE — Progress Notes (Signed)
SATURATION QUALIFICATIONS: (This note is used to comply with regulatory documentation for home oxygen)  Patient Saturations on Room Air at Rest = 86%  Patient Saturations on 4 Liters of oxygen while Ambulating = 93%  Please briefly explain why patient needs home oxygen: Patient had shortness of breath and increased work of breathing while on room air when sitting up. Was unable to tolerate walking without oxygen.

## 2018-10-30 NOTE — Progress Notes (Addendum)
PROGRESS NOTE                                                                                                                                                                                                             Patient Demographics:    Theresa Kramer, is a 77 y.o. female, DOB - 05/19/1941, WJX:[id number]  Admit date - 10/28/2018   Admitting Physician Sid Falcon, MD  Outpatient Primary MD for the patient is Asencion Noble, MD  LOS - 1  Chief Complaint  Patient presents with  . Urinary Tract Infection       Brief Narrative  Theresa Kramer is a 77 y.o. female with medical history significant of CLL, Dementia, h/o stroke, HTN, HLD, DM2, GERD, Depression, agitation, B12 deficiency, arthritis who presents for AMS and cough.  [name] has dementia and is cared for by her daughter Theresa Kramer.  On 12/24, Theresa Kramer noted a dry cough in her mother which turned productive, she also noticed some subjective dysuria, mild confusion along with a temp of 100.3.  She was diagnosed with a URI/early pneumonia and admitted to the hospital.   Subjective:    Theresa Kramer today has, No headache, No chest pain, No abdominal pain - No Nausea, No new weakness tingling or numbness, Improved Cough & SOB.     Assessment  & Plan :     1.  Toxic encephalopathy caused by URI/early pneumonia.  Patient has been placed on gentle hydration along with empiric antibiotics with good effect, she is close to her baseline although still mildly short of breath, no chest pain.  No clinical suspicion for PE.  Will also rule out influenza.  Continue supportive care, flutter valve added for pulmonary toiletry and encouraged to sit up.  Ambulatory pulse ox monitoring.  UTI has been ruled out, continue to monitor.  2.  CLL.  Undergoing chemo under the care of Dr. Irene Limbo.  3.  Anemia of chronic disease.  Slightly worse due to him dilution, she is symptomatic in terms of shortness of breath and mild tachycardia will transfuse 1  unit and monitor.  No signs of acute bleed. DW Onco on call Dr.Mohammad.  4.  Essential hypertension.  Low-dose Lopressor and monitor.  5.  ARF.  Likely due to dehydration.  Has been hydrated, 1 unit of packed RBC transfusion today and monitor.  Hold ARB HCTZ combination.  6.  GERD.  On famotidine continue.  7.  Anxiety and depression.  Home medications continued  unchanged.  8.  Dyslipidemia.  On statin.  9.  History of stroke.  On statin and Plavix for secondary prevention continue.    Family Communication  :  daughter  Code Status :  Full  Disposition Plan  :  TBD  Consults  :  None  Procedures  :    Recent CTA 10/06/2018 - no PE    DVT Prophylaxis  :  Lovenox    Lab Results  Component Value Date   PLT 371 10/30/2018    Diet :  Diet Order            Diet Carb Modified Fluid consistency: Thin; Room service appropriate? Yes  Diet effective now               Inpatient Medications Scheduled Meds: . sodium chloride   Intravenous Once  . citalopram  20 mg Oral Daily  . clopidogrel  75 mg Oral Daily  . enoxaparin (LOVENOX) injection  40 mg Subcutaneous Q24H  . famotidine  10 mg Oral QHS  . furosemide  40 mg Intravenous Once  . insulin aspart  0-15 Units Subcutaneous TID WC  . insulin aspart  0-5 Units Subcutaneous QHS  . insulin glargine  25 Units Subcutaneous QHS  . LORazepam  0.25 mg Oral Daily  . metoprolol tartrate  25 mg Oral BID  . oxybutynin  10 mg Oral QHS  . pramipexole  0.25 mg Oral QHS  . pravastatin  80 mg Oral Daily  . risperiDONE  0.5 mg Oral Q breakfast  . traZODone  50 mg Oral QHS   Continuous Infusions: . azithromycin 500 mg (10/30/18 1017)  . cefTRIAXone (ROCEPHIN)  IV 2 g (10/30/18 0932)   PRN Meds:.acetaminophen, diphenhydrAMINE, ondansetron, traMADol  Antibiotics  :   Anti-infectives (From admission, onward)   Start     Dose/Rate Route Frequency Ordered Stop   10/27/2018 0945  cefTRIAXone (ROCEPHIN) 2 g in sodium chloride 0.9 %  100 mL IVPB     2 g 200 mL/hr over 30 Minutes Intravenous Every 24 hours 10/22/2018 0941     10/28/2018 0945  azithromycin (ZITHROMAX) 500 mg in sodium chloride 0.9 % 250 mL IVPB     500 mg 250 mL/hr over 60 Minutes Intravenous Every 24 hours 10/22/2018 0941            Objective:   Vitals:   10/08/2018 2231 11/01/2018 2300 10/30/18 0300 10/30/18 0459  BP:    119/87  Pulse:    79  Resp:  20 18 16   Temp:    99.2 F (37.3 C)  TempSrc:    Oral  SpO2: 91% 93% 95% 91%  Weight:      Height:        Wt Readings from Last 3 Encounters:  11/01/2018 83.6 kg  10/12/18 83.5 kg  09/22/18 83.9 kg     Intake/Output Summary (Last 24 hours) at 10/30/2018 1027 Last data filed at 10/30/2018 0430 Gross per 24 hour  Intake 370.52 ml  Output 400 ml  Net -29.48 ml     Physical Exam  Awake Alert, Oriented X 3, No new F.N deficits, Normal affect [place].AT,PERRAL Supple Neck,No JVD, No cervical lymphadenopathy appriciated.  Symmetrical Chest wall movement, Good air movement bilaterally, few coarse Breath sounds bilat RRR,No Gallops,Rubs or new Murmurs, No Parasternal Heave +ve B.Sounds, Abd Soft, No tenderness, No organomegaly appriciated, No rebound - guarding or rigidity. No Cyanosis, Clubbing or edema, No new Rash or bruise  Data Review:    CBC Recent Labs  Lab 10/28/2018 0941 10/30/18 0416  WBC 126.4* 148.5*  HGB 8.6* 7.7*  HCT 29.0* 26.7*  PLT 388 371  MCV 85.8 84.5  MCH 25.4* 24.4*  MCHC 29.7* 28.8*  RDW 16.9* 16.8*  LYMPHSABS 79.6*  --   MONOABS 22.8*  --   EOSABS 0.0  --   BASOSABS 1.3*  --     Chemistries  Recent Labs  Lab 10/19/2018 0941 10/30/18 0416  NA 134* 135  K 4.7 4.2  CL 98 100  CO2 21* 21*  GLUCOSE 154* 110*  BUN 31* 35*  CREATININE 1.57* 1.33*  CALCIUM 8.7* 8.3*  AST 22 18  ALT 20 13  ALKPHOS 234* 188*  BILITOT 1.1 0.3   ------------------------------------------------------------------------------------------------------------------ No results  for input(s): CHOL, HDL, LDLCALC, TRIG, CHOLHDL, LDLDIRECT in the last 72 hours.  Lab Results  Component Value Date   HGBA1C 8.3 (H) 07/30/2016   ------------------------------------------------------------------------------------------------------------------ No results for input(s): TSH, T4TOTAL, T3FREE, THYROIDAB in the last 72 hours.  Invalid input(s): FREET3 ------------------------------------------------------------------------------------------------------------------ No results for input(s): VITAMINB12, FOLATE, FERRITIN, TIBC, IRON, RETICCTPCT in the last 72 hours.  Coagulation profile No results for input(s): INR, PROTIME in the last 168 hours.  No results for input(s): DDIMER in the last 72 hours.  Cardiac Enzymes No results for input(s): CKMB, TROPONINI, MYOGLOBIN in the last 168 hours.  Invalid input(s): CK ------------------------------------------------------------------------------------------------------------------ No results found for: BNP  Micro Results Recent Results (from the past 240 hour(s))  Blood Culture (routine x 2)     Status: None (Preliminary result)   Collection Time: 10/03/2018 10:56 AM  Result Value Ref Range Status   Specimen Description BLOOD BLOOD LEFT HAND  Final   Special Requests   Final    BOTTLES DRAWN AEROBIC AND ANAEROBIC Blood Culture results may not be optimal due to an inadequate volume of blood received in culture bottles   Culture   Final    NO GROWTH < 24 HOURS Performed at [facility] [address], [place], [place] [address]    Report Status PENDING  Incomplete  Blood Culture (routine x 2)     Status: None (Preliminary result)   Collection Time: 10/22/2018 10:56 AM  Result Value Ref Range Status   Specimen Description BLOOD BLOOD LEFT FOREARM  Final   Special Requests   Final    BOTTLES DRAWN AEROBIC AND ANAEROBIC Blood Culture adequate volume   Culture   Final    NO GROWTH < 24 HOURS Performed at [facility], [place] [address] [place], [place] [address]    Report Status PENDING  Incomplete    Radiology Reports Dg Chest 2 View  Result Date: 10/31/2018 CLINICAL DATA:  Cough and shortness of breath EXAM: CHEST - 2 VIEW COMPARISON:  December 21, 2017 FINDINGS: There is atelectatic change in the lateral left base. The lungs elsewhere are clear. Heart size and pulmonary vascularity are normal. No adenopathy. Postoperative changes noted in the lower cervical spine. Total shoulder replacement noted on the left. There is aortic atherosclerosis. IMPRESSION: Atelectasis lateral left base. This area may represent earliest changes of pneumonia. Lungs elsewhere clear. No adenopathy appreciable. There is aortic atherosclerosis. Aortic Atherosclerosis (ICD10-I70.0). Electronically Signed   By: Lowella Grip III M.D.   On: 10/26/2018 10:14   Ct Chest W Contrast  Result Date: 10/06/2018 CLINICAL DATA:  CLL/SLL.  Worsening anemia.  Restaging. EXAM: CT CHEST, ABDOMEN, AND PELVIS WITH CONTRAST TECHNIQUE: Multidetector  CT imaging of the chest, abdomen and pelvis was performed following the standard protocol during bolus administration of intravenous contrast. CONTRAST:  176mL OMNIPAQUE IOHEXOL 300 MG/ML  SOLN COMPARISON:  08/26/2016 PET-CT. FINDINGS: CT CHEST FINDINGS Cardiovascular: Normal heart size. No significant pericardial effusion/thickening. Three-vessel coronary atherosclerosis. Atherosclerotic nonaneurysmal thoracic aorta. Normal caliber pulmonary arteries. No central pulmonary emboli. Mediastinum/Nodes: No discrete thyroid nodules. Unremarkable esophagus. No pathologically enlarged axillary, mediastinal or hilar lymph nodes. Lungs/Pleura: No pneumothorax. No pleural effusion. Clustered centrilobular nodularity with faint calcifications in the anterior left lower lobe, unchanged since 08/26/2016 PET-CT, compatible with benign postinflammatory nodularity. No acute consolidative airspace disease, lung masses or  new significant pulmonary nodules. Musculoskeletal: No aggressive appearing focal osseous lesions. Partially visualized surgical hardware from ACDF in the lower cervical spine. Left total shoulder arthroplasty. Mild thoracic spondylosis. CT ABDOMEN PELVIS FINDINGS Hepatobiliary: Scattered granulomatous calcifications throughout the liver, unchanged. No liver mass. Normal gallbladder with no radiopaque cholelithiasis. No biliary ductal dilatation. Pancreas: Normal, with no mass or duct dilation. Spleen: Marked splenomegaly (craniocaudal splenic length 22.8 cm, increased from 15.7 cm on 08/26/2016 PET-CT). No splenic mass. Scattered granulomatous splenic calcifications, unchanged. Adrenals/Urinary Tract: Normal adrenals. Normal kidneys with no hydronephrosis and no renal mass. Bladder obscured by streak artifact from the right hip hardware. Bladder appears nondistended and normal. Stomach/Bowel: Small hiatal hernia. Otherwise normal nondistended stomach. Normal caliber small bowel with no small bowel wall thickening. Appendectomy. Moderate sigmoid diverticulosis, no large bowel wall thickening or acute pericolonic fat stranding. Vascular/Lymphatic: Atherosclerotic nonaneurysmal abdominal aorta. Patent portal, splenic, hepatic and renal veins. No pathologically enlarged lymph nodes in the abdomen or pelvis. Reproductive: Status post hysterectomy, with no abnormal findings at the vaginal cuff. No adnexal mass. Other: No pneumoperitoneum, ascites or focal fluid collection. Musculoskeletal: No aggressive appearing focal osseous lesions. Right total hip arthroplasty. Marked lumbar spondylosis. IMPRESSION: 1. Marked splenomegaly, significantly increased in size since 2017 PET-CT. 2. No lymphadenopathy or other sites of lymphoproliferative disease. 3. Three-vessel coronary atherosclerosis. 4.  Aortic Atherosclerosis (ICD10-I70.0). Electronically Signed   By: Ilona Sorrel M.D.   On: 10/06/2018 15:13   Ct Abdomen Pelvis W  Contrast  Result Date: 10/06/2018 CLINICAL DATA:  CLL/SLL.  Worsening anemia.  Restaging. EXAM: CT CHEST, ABDOMEN, AND PELVIS WITH CONTRAST TECHNIQUE: Multidetector CT imaging of the chest, abdomen and pelvis was performed following the standard protocol during bolus administration of intravenous contrast. CONTRAST:  14mL OMNIPAQUE IOHEXOL 300 MG/ML  SOLN COMPARISON:  08/26/2016 PET-CT. FINDINGS: CT CHEST FINDINGS Cardiovascular: Normal heart size. No significant pericardial effusion/thickening. Three-vessel coronary atherosclerosis. Atherosclerotic nonaneurysmal thoracic aorta. Normal caliber pulmonary arteries. No central pulmonary emboli. Mediastinum/Nodes: No discrete thyroid nodules. Unremarkable esophagus. No pathologically enlarged axillary, mediastinal or hilar lymph nodes. Lungs/Pleura: No pneumothorax. No pleural effusion. Clustered centrilobular nodularity with faint calcifications in the anterior left lower lobe, unchanged since 08/26/2016 PET-CT, compatible with benign postinflammatory nodularity. No acute consolidative airspace disease, lung masses or new significant pulmonary nodules. Musculoskeletal: No aggressive appearing focal osseous lesions. Partially visualized surgical hardware from ACDF in the lower cervical spine. Left total shoulder arthroplasty. Mild thoracic spondylosis. CT ABDOMEN PELVIS FINDINGS Hepatobiliary: Scattered granulomatous calcifications throughout the liver, unchanged. No liver mass. Normal gallbladder with no radiopaque cholelithiasis. No biliary ductal dilatation. Pancreas: Normal, with no mass or duct dilation. Spleen: Marked splenomegaly (craniocaudal splenic length 22.8 cm, increased from 15.7 cm on 08/26/2016 PET-CT). No splenic mass. Scattered granulomatous splenic calcifications, unchanged. Adrenals/Urinary Tract: Normal adrenals. Normal kidneys with no hydronephrosis and no renal mass. Bladder  obscured by streak artifact from the right hip hardware. Bladder  appears nondistended and normal. Stomach/Bowel: Small hiatal hernia. Otherwise normal nondistended stomach. Normal caliber small bowel with no small bowel wall thickening. Appendectomy. Moderate sigmoid diverticulosis, no large bowel wall thickening or acute pericolonic fat stranding. Vascular/Lymphatic: Atherosclerotic nonaneurysmal abdominal aorta. Patent portal, splenic, hepatic and renal veins. No pathologically enlarged lymph nodes in the abdomen or pelvis. Reproductive: Status post hysterectomy, with no abnormal findings at the vaginal cuff. No adnexal mass. Other: No pneumoperitoneum, ascites or focal fluid collection. Musculoskeletal: No aggressive appearing focal osseous lesions. Right total hip arthroplasty. Marked lumbar spondylosis. IMPRESSION: 1. Marked splenomegaly, significantly increased in size since 2017 PET-CT. 2. No lymphadenopathy or other sites of lymphoproliferative disease. 3. Three-vessel coronary atherosclerosis. 4.  Aortic Atherosclerosis (ICD10-I70.0). Electronically Signed   By: Ilona Sorrel M.D.   On: 10/06/2018 15:13    Time Spent in minutes  30   Lala Lund M.D on 10/30/2018 at 10:27 AM  To page go to [email or web address] - password [email or web address]

## 2018-10-30 NOTE — Evaluation (Signed)
Physical Therapy Evaluation Patient Details Name: Theresa Kramer MRN: [id number] DOB: 01-Jul-1941 Today's Date: 10/30/2018   History of Present Illness  77 y.o. female with medical history significant of CLL, Dementia, h/o stroke, HTN, HLD, DM2, GERD, Depression, agitation, B12 deficiency, arthritis who presents for AMS and cough.  Theresa Kramer has dementia and is cared for by her daughter Theresa Kramer.  On 12/24, Theresa Kramer noted a dry cough in her mother which turned productive, she also noticed some subjective dysuria, mild confusion along with a temp of 100.3.  She was diagnosed with a URI/early pneumonia and admitted to the hospital.  Clinical Impression  Orders received for PT evaluation. Patient demonstrates deficits in functional mobility as indicated below. Will benefit from continued skilled PT to address deficits and maximize function. Will see as indicated and progress as tolerated.  Anticipate patient will progress well and be able to return home with current caregiver assist and HHPT.    Follow Up Recommendations Home health PT;Supervision/Assistance - 24 hour    Equipment Recommendations  None recommended by PT    Recommendations for Other Services       Precautions / Restrictions Precautions Precautions: Fall Precaution Comments: watch O2 saturations      Mobility  Bed Mobility               General bed mobility comments: received in recliner  Transfers Overall transfer level: Needs assistance Equipment used: None Transfers: Sit to/from Stand Sit to Stand: Supervision         General transfer comment: supervision for safety and line management  Ambulation/Gait Ambulation/Gait assistance: Min guard Gait Distance (Feet): 90 Feet Assistive device: 1 person hand held assist   Gait velocity: decreased   General Gait Details: modest instability, ambulated on 4 liters with saturations 90% or higher, attempted 3 liters with desaturation   Stairs             Wheelchair Mobility    Modified Rankin (Stroke Patients Only)       Balance Overall balance assessment: Needs assistance;Mild deficits observed, not formally tested   Sitting balance-Leahy Scale: Good     Standing balance support: During functional activity Standing balance-Leahy Scale: Fair Standing balance comment: able to perform functional tasks without LOB                             Pertinent Vitals/Pain Pain Assessment: No/denies pain    Home Living Family/patient expects to be discharged to:: Private residence Living Arrangements: Children Available Help at Discharge: Family Type of Home: House Home Access: Stairs to enter   Technical brewer of Steps: 1(threshold) Home Layout: One level Home Equipment: Environmental consultant - 2 wheels;Bedside commode;Shower seat;Grab bars - tub/shower;Grab bars - toilet;Wheelchair - [employer] - 4 wheels      Prior Function Level of Independence: Needs assistance   Gait / Transfers Assistance Needed: mobilizes independently in the home, supervision otherwise  ADL's / Homemaking Assistance Needed: supervision for safety and cognitive issues (daughter assists with bathing   Comments: care giver for multiple hours during the day     Hand Dominance   Dominant Hand: Right    Extremity/Trunk Assessment   Upper Extremity Assessment Upper Extremity Assessment: Generalized weakness    Lower Extremity Assessment Lower Extremity Assessment: Generalized weakness       Communication      Cognition Arousal/Alertness: Awake/alert Behavior During Therapy: WFL for tasks assessed/performed Overall Cognitive Status: History of cognitive impairments -  at baseline                                        General Comments      Exercises     Assessment/Plan    PT Assessment Patient needs continued PT services  PT Problem List Decreased strength;Decreased activity tolerance;Decreased balance;Decreased  mobility;Decreased cognition;Decreased safety awareness;Cardiopulmonary status limiting activity       PT Treatment Interventions DME instruction;Gait training;Functional mobility training;Therapeutic activities;Therapeutic exercise;Balance training;Patient/family education    PT Goals (Current goals can be found in the Care Plan section)  Acute Rehab PT Goals Patient Stated Goal: to go home PT Goal Formulation: With patient/family Time For Goal Achievement: 11/13/18 Potential to Achieve Goals: Good    Frequency Min 3X/week   Barriers to discharge        Co-evaluation               AM-PAC PT "6 Clicks" Mobility  Outcome Measure Help needed turning from your back to your side while in a flat bed without using bedrails?: A Little Help needed moving from lying on your back to sitting on the side of a flat bed without using bedrails?: A Little Help needed moving to and from a bed to a chair (including a wheelchair)?: A Little Help needed standing up from a chair using your arms (e.g., wheelchair or bedside chair)?: A Little Help needed to walk in hospital room?: A Little Help needed climbing 3-5 steps with a railing? : A Lot 6 Click Score: 17    End of Session Equipment Utilized During Treatment: Oxygen Activity Tolerance: Patient tolerated treatment well;Patient limited by fatigue Patient left: in chair;with call bell/phone within reach;with chair alarm set;with family/visitor present Nurse Communication: Mobility status PT Visit Diagnosis: Difficulty in walking, not elsewhere classified (R26.2);Other symptoms and signs involving the nervous system (R29.898)    Time: 1340-1401 PT Time Calculation (min) (ACUTE ONLY): 21 min   Charges:   PT Evaluation $PT Eval Moderate Complexity: 1 Mod          Theresa Kramer, PT DPT  Board Certified Neurologic Specialist Acute Rehabilitation Services Pager [phone] Office [phone]   Theresa Kramer 10/30/2018, 2:07  PM

## 2018-10-31 ENCOUNTER — Encounter (HOSPITAL_COMMUNITY): Payer: Self-pay | Admitting: Nurse Practitioner

## 2018-10-31 ENCOUNTER — Inpatient Hospital Stay (HOSPITAL_COMMUNITY): Payer: Medicare Other

## 2018-10-31 DIAGNOSIS — I480 Paroxysmal atrial fibrillation: Secondary | ICD-10-CM

## 2018-10-31 DIAGNOSIS — I5031 Acute diastolic (congestive) heart failure: Secondary | ICD-10-CM

## 2018-10-31 LAB — BLOOD GAS, ARTERIAL
Acid-base deficit: 4.3 mmol/L — ABNORMAL HIGH (ref 0.0–2.0)
BICARBONATE: 19.9 mmol/L — AB (ref 20.0–28.0)
Drawn by: 511551
O2 Content: 5 L/min
O2 Saturation: 86.9 %
PO2 ART: 56.6 mmHg — AB (ref 83.0–108.0)
Patient temperature: 98.6
pCO2 arterial: 34.3 mmHg (ref 32.0–48.0)
pH, Arterial: 7.382 (ref 7.350–7.450)

## 2018-10-31 LAB — GLUCOSE, CAPILLARY
Glucose-Capillary: 130 mg/dL — ABNORMAL HIGH (ref 70–99)
Glucose-Capillary: 129 mg/dL — ABNORMAL HIGH (ref 70–99)
Glucose-Capillary: 113 mg/dL — ABNORMAL HIGH (ref 70–99)
Glucose-Capillary: 170 mg/dL — ABNORMAL HIGH (ref 70–99)

## 2018-10-31 LAB — LEGIONELLA PNEUMOPHILA SEROGP 1 UR AG: L. pneumophila Serogp 1 Ur Ag: NEGATIVE

## 2018-10-31 LAB — CBC
HCT: 28.7 % — ABNORMAL LOW (ref 36.0–46.0)
Hemoglobin: 8.8 g/dL — ABNORMAL LOW (ref 12.0–15.0)
MCH: 26 pg (ref 26.0–34.0)
MCHC: 30.7 g/dL (ref 30.0–36.0)
MCV: 84.9 fL (ref 80.0–100.0)
Platelets: 457 10*3/uL — ABNORMAL HIGH (ref 150–400)
RBC: 3.38 MIL/uL — ABNORMAL LOW (ref 3.87–5.11)
RDW: 17.2 % — AB (ref 11.5–15.5)
WBC: 154.1 10*3/uL (ref 4.0–10.5)
nRBC: 0 % (ref 0.0–0.2)

## 2018-10-31 LAB — BASIC METABOLIC PANEL
Anion gap: 16 — ABNORMAL HIGH (ref 5–15)
BUN: 41 mg/dL — ABNORMAL HIGH (ref 8–23)
CALCIUM: 8.4 mg/dL — AB (ref 8.9–10.3)
CO2: 19 mmol/L — ABNORMAL LOW (ref 22–32)
Chloride: 100 mmol/L (ref 98–111)
Creatinine, Ser: 1.38 mg/dL — ABNORMAL HIGH (ref 0.44–1.00)
GFR calc Af Amer: 42 mL/min — ABNORMAL LOW (ref >60)
GFR calc non Af Amer: 37 mL/min — ABNORMAL LOW (ref >60)
Glucose, Bld: 155 mg/dL — ABNORMAL HIGH (ref 70–99)
Potassium: 4.9 mmol/L (ref 3.5–5.1)
Sodium: 135 mmol/L (ref 135–145)

## 2018-10-31 MED ORDER — DILTIAZEM HCL-DEXTROSE 100-5 MG/100ML-% IV SOLN (PREMIX)
5.0000 mg/h | INTRAVENOUS | Status: DC
  Administered 2018-10-31: 5 mg/h via INTRAVENOUS
  Filled 2018-10-31: qty 100

## 2018-10-31 MED ORDER — LEVALBUTEROL HCL 0.63 MG/3ML IN NEBU
0.6300 mg | INHALATION_SOLUTION | Freq: Four times a day (QID) | RESPIRATORY_TRACT | Status: DC | PRN
  Administered 2018-10-31 – 2018-11-01 (×2): 0.63 mg via RESPIRATORY_TRACT
  Filled 2018-10-31 (×3): qty 3

## 2018-10-31 MED ORDER — HALOPERIDOL LACTATE 5 MG/ML IJ SOLN
2.0000 mg | Freq: Once | INTRAMUSCULAR | Status: AC
  Administered 2018-10-31: 2 mg via INTRAVENOUS
  Filled 2018-10-31: qty 1

## 2018-10-31 MED ORDER — FUROSEMIDE 10 MG/ML IJ SOLN
20.0000 mg | Freq: Once | INTRAMUSCULAR | Status: AC
  Administered 2018-10-31: 20 mg via INTRAVENOUS
  Filled 2018-10-31: qty 2

## 2018-10-31 MED ORDER — ORAL CARE MOUTH RINSE
15.0000 mL | Freq: Two times a day (BID) | OROMUCOSAL | Status: DC
  Administered 2018-10-31: 15 mL via OROMUCOSAL

## 2018-10-31 MED ORDER — AMIODARONE HCL IN DEXTROSE 360-4.14 MG/200ML-% IV SOLN
60.0000 mg/h | INTRAVENOUS | Status: DC
  Filled 2018-10-31: qty 200

## 2018-10-31 MED ORDER — AMIODARONE HCL IN DEXTROSE 360-4.14 MG/200ML-% IV SOLN: 30.0000 mg/h | INTRAVENOUS | Status: DC

## 2018-10-31 MED ORDER — DILTIAZEM HCL 25 MG/5ML IV SOLN: 5.0000 mg | Freq: Once | INTRAVENOUS | Status: DC

## 2018-10-31 MED ORDER — FUROSEMIDE 10 MG/ML IJ SOLN
40.0000 mg | Freq: Once | INTRAMUSCULAR | Status: AC
  Administered 2018-10-31: 40 mg via INTRAVENOUS
  Filled 2018-10-31: qty 4

## 2018-10-31 MED ORDER — AMIODARONE LOAD VIA INFUSION
150.0000 mg | Freq: Once | INTRAVENOUS | Status: DC
  Filled 2018-10-31: qty 83.34

## 2018-10-31 MED ORDER — AMIODARONE HCL 200 MG PO TABS
400.0000 mg | ORAL_TABLET | Freq: Two times a day (BID) | ORAL | Status: DC
  Administered 2018-10-31 – 2018-11-01 (×3): 400 mg via ORAL
  Filled 2018-10-31 (×5): qty 2

## 2018-10-31 MED ORDER — SODIUM CHLORIDE 0.9 % IV BOLUS
500.0000 mL | Freq: Once | INTRAVENOUS | Status: DC
  Administered 2018-10-31: 500 mL via INTRAVENOUS

## 2018-10-31 MED ORDER — AMIODARONE LOAD VIA INFUSION: 150.0000 mg | Freq: Once | INTRAVENOUS | Status: DC

## 2018-10-31 MED ORDER — FUROSEMIDE 10 MG/ML IJ SOLN
60.0000 mg | Freq: Once | INTRAMUSCULAR | Status: AC
  Administered 2018-10-31: 60 mg via INTRAVENOUS
  Filled 2018-10-31: qty 6

## 2018-10-31 MED ORDER — APIXABAN 5 MG PO TABS
5.0000 mg | ORAL_TABLET | Freq: Two times a day (BID) | ORAL | Status: DC
  Administered 2018-10-31 – 2018-11-01 (×3): 5 mg via ORAL
  Filled 2018-10-31 (×5): qty 1

## 2018-10-31 MED ORDER — DILTIAZEM HCL 25 MG/5ML IV SOLN
10.0000 mg | Freq: Once | INTRAVENOUS | Status: AC
  Administered 2018-10-31: 10 mg via INTRAVENOUS
  Filled 2018-10-31: qty 5

## 2018-10-31 MED ORDER — DIGOXIN 0.25 MG/ML IJ SOLN
0.2500 mg | Freq: Once | INTRAMUSCULAR | Status: AC
  Administered 2018-10-31: 0.25 mg via INTRAVENOUS
  Filled 2018-10-31: qty 2

## 2018-10-31 NOTE — Consult Note (Addendum)
Cardiology Consult    Patient ID: Theresa Kramer MRN: [id number], DOB/AGE: 01-19-1941   Admit date: 10/24/2018 Date of Consult: 10/31/2018  Primary Physician: Asencion Noble, MD Primary Cardiologist: New - P. Harrington Challenger, MD  Requesting Provider: P. Candiss Norse  Patient Profile    Theresa Kramer is a 77 y.o. female with a history of CLL, dementia, CVA (07/2016), HTN, HL, DMII, GERD, depression agitation, B12 deficiency, and OA, who is being seen today for the evaluation of new onset AFib RVR at the request of Dr. Candiss Norse.  Past Medical History   Past Medical History:  Diagnosis Date  . Arthritis   . B12 deficiency   . Bronchitis    hx of  . Cancer ([place])   . CAP (community acquired pneumonia) 10/07/2018  . Complication of anesthesia   . Depression    "not taking medication"  . Diabetes mellitus   . Diastolic dysfunction    a. 07/2016 Echo: EF 60-65%, no rwma, Gr1 DD. Mildly dil LA. PASP 24mmHg.  Marland Kitchen GERD (gastroesophageal reflux disease)   . H/O hiatal hernia   . Hyperlipidemia   . Hypertension   . Noncompliance with medication regimen   . Pinched cervical nerve root   . PONV (postoperative nausea and vomiting)   . Stroke [facility])    a. 07/2016 MRA: R ACA territory infarct w/ R A2 segment occlusion; b. 07/2016 Echo: Nl EF, Gr1 DD; c. 10/2016 Event monitor: No afib.  . Urinary tract infection    hx of    Past Surgical History:  Procedure Laterality Date  . ABDOMINAL HYSTERECTOMY    . ANTERIOR CERVICAL DECOMP/DISCECTOMY FUSION  03/15/2012   Procedure: ANTERIOR CERVICAL DECOMPRESSION/DISCECTOMY FUSION 2 LEVELS;  Surgeon: Ophelia Charter, MD;  Location: [place] [facility]URO ORS;  Service: Neurosurgery;  Laterality: N/A;  Cervical four-five Cervical five six anterior cervical decompression with fusion interbody prothesis plating and bonegraft  . APPENDECTOMY    . BIOPSY  05/18/2017   Procedure: BIOPSY;  Surgeon: Daneil Dolin, MD;  [facility];  Service: Endoscopy;;  gastric bx's  . BONE CYST  EXCISION     from lower back and foot  . CHOLECYSTECTOMY    . COLONOSCOPY  2011   Dr. Gala Romney: prep compromised exam. diverticulosis, hemorrhoids. next tcs 2021  . COLONOSCOPY WITH PROPOFOL N/A 05/18/2017   Procedure: COLONOSCOPY WITH PROPOFOL;  Surgeon: Daneil Dolin, MD;  [facility];  Service: Endoscopy;  Laterality: N/A;  1100  . DILATION AND CURETTAGE OF UTERUS    . 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 Multiple distal rings dilated and disrupted as described above. Small hiatal hernia.   Marland Kitchen ESOPHAGOGASTRODUODENOSCOPY (EGD) WITH PROPOFOL N/A 05/18/2017   Procedure: ESOPHAGOGASTRODUODENOSCOPY (EGD) WITH PROPOFOL;  Surgeon: Daneil Dolin, MD;  [facility];  Service: Endoscopy;  Laterality: N/A;  . EXCISION METACARPAL MASS Left 03/03/2016   Procedure: EXCISION OF LEFT  DORSAL MASS OF EXTENSOR TENDON;  Surgeon: Milly Jakob, MD;  Location: [place];  Service: Orthopedics;  Laterality: Left;  . EYE SURGERY     bilateral cataract surgery,   . HIP ARTHROPLASTY     right  . JOINT REPLACEMENT    . KNEE SURGERY     torn cartilage repair  . MALONEY DILATION N/A 09/19/2014   Procedure: Venia Minks DILATION;  Surgeon: Daneil Dolin, MD;  [facility];  Service: Endoscopy;  Laterality: N/A;  . ORTHOPEDIC  SURGERY    . POLYPECTOMY  05/18/2017   Procedure: POLYPECTOMY;  Surgeon: Daneil Dolin, MD;  [facility];  Service: Endoscopy;;  polyp at hepatic flexure, descending colon  . ROTATOR CUFF REPAIR     "multiple in both shoulders" and total shoulder replacement in left arm  . SAVORY DILATION N/A 09/19/2014   Procedure: SAVORY DILATION;  Surgeon: Daneil Dolin, MD;  [facility];  Service: Endoscopy;  Laterality: N/A;  . TONSILLECTOMY       Allergies  Allergies  Allergen Reactions  . Meloxicam Other (See Comments)    Stomach pains    History  of Present Illness    77 y/o ? with the above complex PMH including CLL, dementia, stroke, HTN, HL, DMII, GERD, depression, agitation, B12 deficiency, and OA.  She lives locally and her dtr takes care of her.  Her dementia history goes back several years but since her CVA in 07/2016, her dtr notes that she has had significant cognitive decline.  Following her stroke, echo showed nl EF w/ grade 1 diast dysfxn.  She did wear an event monitor following d/c and this did not show any Afib.  She has been managed w/ plavix since.  She is followed closely by heme/onc in the setting of CLL.  WBC's had previously trended int eh 20-40 range but in Oct, she was noted to have an increase to 31.  CT of the Abd was performed earlier this month and showed marked splenomegaly and she is supposed to begin venetoclax therapy in January.  She is relatively sedentary but her dtr says that over the past month, she was showing improved energy and has been walking more (with or without a walker).  She does have bouts of agitation and profound confusion, but overall she had been stable, pleasant, and compliant.  On 12/24, her dtr noted a dry cough that progressed to a more productive cough and fever of 100F.  She was seen @ [facility] on 12/26 and placed on amoxicillin for URI but on 12/27, she became more restless and confused and she presented back to UC and was referred to the ED for eval.  CXR showed possible PNA and WBC was elevated even further to 126.4.  She also reported urinary symptoms though UA was nitrite neg.  She was admitted and placed on azithromycin and rocephin were initiated.  Following admission, she cont to have intermittent agitation, though was hemodynamically stable.  This AM, @ 4:34, she developed rapid Afib with rates into the 180's initially.  She was placed on dilt gtt and given IV digoxin without much impact.  Rates persisted in the 130's to 140's.  Pt was noted to be more agitated w/ increase wob.  CXR showed  interstitial edema and she has received a total of 80mg  of IV lasix since 6am.  Upon my arrival she was restless - multiple family members/friends @ bedside.  Pt denied c/p, dyspnea, palpitations, though dtr says that she c/o heart racing earlier.    Inpatient Medications    . sodium chloride   Intravenous Once  . amiodarone  400 mg Oral BID  . citalopram  20 mg Oral Daily  . clopidogrel  75 mg Oral Daily  . enoxaparin (LOVENOX) injection  40 mg Subcutaneous Q24H  . famotidine  10 mg Oral QHS  . insulin aspart  0-15 Units Subcutaneous TID WC  . insulin aspart  0-5 Units Subcutaneous QHS  . insulin glargine  25 Units Subcutaneous QHS  . LORazepam  0.25 mg Oral Daily  . mouth rinse  15 mL Mouth Rinse BID  . metoprolol tartrate  25 mg Oral BID  . oxybutynin  10 mg Oral QHS  . pramipexole  0.25 mg Oral QHS  . pravastatin  80 mg Oral Daily  . risperiDONE  0.5 mg Oral Q breakfast  . traZODone  50 mg Oral QHS    Family History    Family History  Problem Relation Age of Onset  . Cirrhosis Brother        etoh  . Diabetes Mellitus II Father   . Diabetes Mellitus II Sister   . Colon cancer Neg Hx    She indicated that her mother is deceased. She indicated that her father is deceased. She indicated that the status of her sister is unknown. She indicated that the status of her brother is unknown. She indicated that the status of her neg hx is unknown.   Social History    Social History   Socioeconomic History  . Marital status: Widowed    Spouse name: Not on file  . Number of children: Not on file  . Years of education: Not on file  . Highest education level: Not on file  Occupational History  . Not on file  Social Needs  . Financial resource strain: Not on file  . Food insecurity:    Worry: Not on file    Inability: Not on file  . Transportation needs:    Medical: Not on file    Non-medical: Not on file  Tobacco Use  . Smoking status: Former Smoker    Last attempt to  quit: 05/13/1974    Years since quitting: 44.4  . Smokeless tobacco: Never Used  . Tobacco comment: stopped 25 years ago  Substance and Sexual Activity  . Alcohol use: No  . Drug use: No  . Sexual activity: Not on file  Lifestyle  . Physical activity:    Days per week: Not on file    Minutes per session: Not on file  . Stress: Not on file  Relationships  . Social connections:    Talks on phone: Not on file    Gets together: Not on file    Attends religious service: Not on file    Active member of club or organization: Not on file    Attends meetings of clubs or organizations: Not on file    Relationship status: Not on file  . Intimate partner violence:    Fear of current or ex partner: Not on file    Emotionally abused: Not on file    Physically abused: Not on file    Forced sexual activity: Not on file  Other Topics Concern  . Not on file  Social History Narrative   Lives locally with dtr.     Review of Systems    General:  No chills, fever, night sweats or weight changes.  Cardiovascular:  No chest pain, dyspnea on exertion, edema, orthopnea, palpitations, paroxysmal nocturnal dyspnea. Dermatological: No rash, lesions/masses Respiratory: No cough, dyspnea (dtr says that pt had increasing productive cough recently) Urologic: No hematuria, dysuria Abdominal:   No nausea, vomiting, diarrhea, bright red blood per rectum, melena, or hematemesis Neurologic:  No visual changes, wkns, changes in mental status. (dtr says pt more agitated and confused over past few days) All other systems reviewed and are otherwise negative except as noted above.  Physical Exam    Blood  pressure 106/86, pulse (!) 144, temperature (!) 97.5 F (36.4 C), temperature source Oral, resp. rate (!) 27, height 5\' 4"  (1.626 m), weight 83.6 kg, SpO2 90 %.  General: Pleasant, restless and agitated - grabbing at sheets, IV, things on bedside table. Psych: Flat affect. Neuro: .  Disoriented to time and  place.  Moves all extremities spontaneously. HEENT: Normal  Neck: Supple without bruits.  JVP to jaw. Lungs:  Resp regular and unlabored, crackles halfway up bilaterally. Heart: Irregularly irregular, tachycardic, distant, no s3, s4, or murmurs. Abdomen: Obese, soft, non-tender, non-distended, BS + x 4.  Extremities: No clubbing, cyanosis or edema. DP/PT/Radials 1+ and equal bilaterally.  Labs     Lab Results  Component Value Date   WBC 154.1 (HH) 10/31/2018   HGB 8.8 (L) 10/31/2018   HCT 28.7 (L) 10/31/2018   MCV 84.9 10/31/2018   PLT 457 (H) 10/31/2018    Recent Labs  Lab 10/30/18 0416 10/31/18 0704  NA 135 135  K 4.2 4.9  CL 100 100  CO2 21* 19*  BUN 35* 41*  CREATININE 1.33* 1.38*  CALCIUM 8.3* 8.4*  PROT 7.3  --   BILITOT 0.3  --   ALKPHOS 188*  --   ALT 13  --   AST 18  --   GLUCOSE 110* 155*   Lab Results  Component Value Date   CHOL 202 (H) 07/30/2016   HDL 28 (L) 07/30/2016   LDLCALC 122 (H) 07/30/2016   TRIG 259 (H) 07/30/2016   Radiology Studies    Dg Chest 2 View  Result Date: 10/28/2018 CLINICAL DATA:  Cough and shortness of breath EXAM: CHEST - 2 VIEW COMPARISON:  December 21, 2017 FINDINGS: There is atelectatic change in the lateral left base. The lungs elsewhere are clear. Heart size and pulmonary vascularity are normal. No adenopathy. Postoperative changes noted in the lower cervical spine. Total shoulder replacement noted on the left. There is aortic atherosclerosis. IMPRESSION: Atelectasis lateral left base. This area may represent earliest changes of pneumonia. Lungs elsewhere clear. No adenopathy appreciable. There is aortic atherosclerosis. Aortic Atherosclerosis (ICD10-I70.0). Electronically Signed   By: Lowella Grip III M.D.   On: 10/16/2018 10:14   Ct Chest W Contrast  Result Date: 10/06/2018 CLINICAL DATA:  CLL/SLL.  Worsening anemia.  Restaging. EXAM: CT CHEST, ABDOMEN, AND PELVIS WITH CONTRAST TECHNIQUE: Multidetector CT imaging of  the chest, abdomen and pelvis was performed following the standard protocol during bolus administration of intravenous contrast. CONTRAST:  135mL OMNIPAQUE IOHEXOL 300 MG/ML  SOLN COMPARISON:  08/26/2016 PET-CT. FINDINGS: CT CHEST FINDINGS Cardiovascular: Normal heart size. No significant pericardial effusion/thickening. Three-vessel coronary atherosclerosis. Atherosclerotic nonaneurysmal thoracic aorta. Normal caliber pulmonary arteries. No central pulmonary emboli. Mediastinum/Nodes: No discrete thyroid nodules. Unremarkable esophagus. No pathologically enlarged axillary, mediastinal or hilar lymph nodes. Lungs/Pleura: No pneumothorax. No pleural effusion. Clustered centrilobular nodularity with faint calcifications in the anterior left lower lobe, unchanged since 08/26/2016 PET-CT, compatible with benign postinflammatory nodularity. No acute consolidative airspace disease, lung masses or new significant pulmonary nodules. Musculoskeletal: No aggressive appearing focal osseous lesions. Partially visualized surgical hardware from ACDF in the lower cervical spine. Left total shoulder arthroplasty. Mild thoracic spondylosis. CT ABDOMEN PELVIS FINDINGS Hepatobiliary: Scattered granulomatous calcifications throughout the liver, unchanged. No liver mass. Normal gallbladder with no radiopaque cholelithiasis. No biliary ductal dilatation. Pancreas: Normal, with no mass or duct dilation. Spleen: Marked splenomegaly (craniocaudal splenic length 22.8 cm, increased from 15.7 cm on 08/26/2016 PET-CT). No splenic mass. Scattered granulomatous splenic  calcifications, unchanged. Adrenals/Urinary Tract: Normal adrenals. Normal kidneys with no hydronephrosis and no renal mass. Bladder obscured by streak artifact from the right hip hardware. Bladder appears nondistended and normal. Stomach/Bowel: Small hiatal hernia. Otherwise normal nondistended stomach. Normal caliber small bowel with no small bowel wall thickening. Appendectomy.  Moderate sigmoid diverticulosis, no large bowel wall thickening or acute pericolonic fat stranding. Vascular/Lymphatic: Atherosclerotic nonaneurysmal abdominal aorta. Patent portal, splenic, hepatic and renal veins. No pathologically enlarged lymph nodes in the abdomen or pelvis. Reproductive: Status post hysterectomy, with no abnormal findings at the vaginal cuff. No adnexal mass. Other: No pneumoperitoneum, ascites or focal fluid collection. Musculoskeletal: No aggressive appearing focal osseous lesions. Right total hip arthroplasty. Marked lumbar spondylosis. IMPRESSION: 1. Marked splenomegaly, significantly increased in size since 2017 PET-CT. 2. No lymphadenopathy or other sites of lymphoproliferative disease. 3. Three-vessel coronary atherosclerosis. 4.  Aortic Atherosclerosis (ICD10-I70.0). Electronically Signed   By: Ilona Sorrel M.D.   On: 10/06/2018 15:13   Ct Abdomen Pelvis W Contrast  Result Date: 10/06/2018 CLINICAL DATA:  CLL/SLL.  Worsening anemia.  Restaging. EXAM: CT CHEST, ABDOMEN, AND PELVIS WITH CONTRAST TECHNIQUE: Multidetector CT imaging of the chest, abdomen and pelvis was performed following the standard protocol during bolus administration of intravenous contrast. CONTRAST:  133mL OMNIPAQUE IOHEXOL 300 MG/ML  SOLN COMPARISON:  08/26/2016 PET-CT. FINDINGS: CT CHEST FINDINGS Cardiovascular: Normal heart size. No significant pericardial effusion/thickening. Three-vessel coronary atherosclerosis. Atherosclerotic nonaneurysmal thoracic aorta. Normal caliber pulmonary arteries. No central pulmonary emboli. Mediastinum/Nodes: No discrete thyroid nodules. Unremarkable esophagus. No pathologically enlarged axillary, mediastinal or hilar lymph nodes. Lungs/Pleura: No pneumothorax. No pleural effusion. Clustered centrilobular nodularity with faint calcifications in the anterior left lower lobe, unchanged since 08/26/2016 PET-CT, compatible with benign postinflammatory nodularity. No acute  consolidative airspace disease, lung masses or new significant pulmonary nodules. Musculoskeletal: No aggressive appearing focal osseous lesions. Partially visualized surgical hardware from ACDF in the lower cervical spine. Left total shoulder arthroplasty. Mild thoracic spondylosis. CT ABDOMEN PELVIS FINDINGS Hepatobiliary: Scattered granulomatous calcifications throughout the liver, unchanged. No liver mass. Normal gallbladder with no radiopaque cholelithiasis. No biliary ductal dilatation. Pancreas: Normal, with no mass or duct dilation. Spleen: Marked splenomegaly (craniocaudal splenic length 22.8 cm, increased from 15.7 cm on 08/26/2016 PET-CT). No splenic mass. Scattered granulomatous splenic calcifications, unchanged. Adrenals/Urinary Tract: Normal adrenals. Normal kidneys with no hydronephrosis and no renal mass. Bladder obscured by streak artifact from the right hip hardware. Bladder appears nondistended and normal. Stomach/Bowel: Small hiatal hernia. Otherwise normal nondistended stomach. Normal caliber small bowel with no small bowel wall thickening. Appendectomy. Moderate sigmoid diverticulosis, no large bowel wall thickening or acute pericolonic fat stranding. Vascular/Lymphatic: Atherosclerotic nonaneurysmal abdominal aorta. Patent portal, splenic, hepatic and renal veins. No pathologically enlarged lymph nodes in the abdomen or pelvis. Reproductive: Status post hysterectomy, with no abnormal findings at the vaginal cuff. No adnexal mass. Other: No pneumoperitoneum, ascites or focal fluid collection. Musculoskeletal: No aggressive appearing focal osseous lesions. Right total hip arthroplasty. Marked lumbar spondylosis. IMPRESSION: 1. Marked splenomegaly, significantly increased in size since 2017 PET-CT. 2. No lymphadenopathy or other sites of lymphoproliferative disease. 3. Three-vessel coronary atherosclerosis. 4.  Aortic Atherosclerosis (ICD10-I70.0). Electronically Signed   By: Ilona Sorrel M.D.    On: 10/06/2018 15:13   Dg Chest Port 1 View  Result Date: 10/31/2018 CLINICAL DATA:  Acute onset of shortness of breath. EXAM: PORTABLE CHEST 1 VIEW COMPARISON:  Chest radiograph performed 10/05/2018 FINDINGS: The lungs are well-aerated. Vascular congestion is noted. Bilateral perihilar and left basilar  airspace opacities raise concern for pulmonary edema. A small left pleural effusion is noted. No pneumothorax is seen. The cardiomediastinal silhouette is borderline enlarged. No acute osseous abnormalities are seen. A left-sided shoulder arthroplasty is grossly unremarkable in appearance, though incompletely imaged. Cervical spinal fusion hardware is partially imaged. IMPRESSION: Vascular congestion and borderline cardiomegaly. Bilateral perihilar and left basilar airspace opacities raise concern for pulmonary edema. Small left pleural effusion noted. Electronically Signed   By: Garald Balding M.D.   On: 10/31/2018 05:57    ECG & Cardiac Imaging    10/31/2018 Afib, 143, anterolateral ST depression - personally reviewed. 10/27/2018 Sinus tachycardia, 112, PACs  Assessment & Plan    1.  Afib w/ RVR:  Pt admitted with acute resp failure and URI w/ worsening restlessness, agitation, and confusion.  She was in sinus tach w/ PACs on admission.  This AM, she developed rapid Afib into the 180s.  She was noted to have some increase wob and CXR showed vascular congestion.  She was treated w/ dilt bolus/gtt and IV digoxin.  Upon my arrival, she remained in rapid afib in the 130's and I had planned to load w/ IV amio, however, @ 10:12, she converted to sinus rhythm.  BP has been soft on IV dilt.  Lytes, tsh wnl.  Given markedly elevated rate with relative hypotension and anterolateral ST changes while in afib (coronary atherosclerosis noted on recent CT of abd), I think she will be best served with a rhythm control strategy.  I will add oral amiodarone.  CHA2DSVASc = 9.  She has chronic normocytic anemia which  has been stable on plavix therapy since CVA in 2017.  Will likely switch to eliquis 5 bid after discussing with Dr. Harrington Challenger.   2.  URI:  On abx per IM.  Per dtr, pt does choke on food and liquids from time to time.  She did pass swallow eval back in 2017.  Consider repeat.  3.  Acute diastolic CHF:  In setting of rapid afib.  She is volume overloaded on exam w/ JVD and crackles 1/2 up.  She has received 80mg  of IV lasix this AM.  Foley in place.  Follow I/o's and daily wts.  Echo ordered.  May require additional diuresis.  Follow renal fxn closely.  4.  Essential HTN:  bp soft in setting of afib.  Follow.  5.  AKI:  Follow.  6.  CLL:  WBC higher since October.  Plan to start venetoclax as outpt per heme.  Dose reduction likely required in setting of amio initiation.  7.  Coronary Ca2+: noted on recent CT abd.  No h/o chest pain or dyspnea, though activity very limited.  Anterolateral ST depression with tachycardia this AM.  With comorbidities, would not pursue ischemic evaluation further.  Cont low dose  blocker as tolerated and try to prevent recurrent tachycardia. Cont pravastatin therapy.  Signed, Murray Hodgkins, NP 10/31/2018, 10:51 AM  Pt seen and examined   I agree with findings as noted by Angelica Ran above   Pt is a 77 yo who presented in resp failure   This am developed afib with RVR     She has since converted to SR     On exam, pt is confused, restless  In chair Neck:  Full Cardiac exam:   RRR   N oS3   No signif murmurs Lungs with decreased airflow   Rales 1/3 up     Ext with triv edema  1.  PAF    No prior history    May have been exacerbated by current illness   With rates so high would dow well to stay in [place] with amiodarone po    Can use IV if reverts to afib again With hx of CVA and no clear contraindication would start Eliquis     Follow CBC closely over time Echo pending   2  Acute on chronic diastolic CHF    Lasix just given   Will dose as needed for response     Will continue to follow   Dorris Carnes  For questions or updates, please contact   Please consult [email or web address] for contact info under Cardiology/STEMI.

## 2018-10-31 NOTE — Progress Notes (Signed)
Pt is confused and repeatedly removing the continuous pulse ox from her finger.  Pt's daughter is present at the bedside, states that pt is acutely confused more than normal, but does not appear to be in any respiratory distress.  Continuous pulse ox turned off.  Will continue to monitor.

## 2018-10-31 NOTE — Progress Notes (Addendum)
PROGRESS NOTE                                                                                                                                                                                                             Patient Demographics:    Theresa Kramer, is a 77 y.o. female, DOB - 07-08-1941, DEY:[id number]  Admit date - 10/30/2018   Admitting Physician Sid Falcon, MD  Outpatient Primary MD for the patient is Asencion Noble, MD  LOS - 2  Chief Complaint  Patient presents with  . Urinary Tract Infection       Brief Narrative  Theresa Kramer is a 77 y.o. female with medical history significant of CLL, Dementia, h/o stroke, HTN, HLD, DM2, GERD, Depression, agitation, B12 deficiency, arthritis who presents for AMS and cough.  Theresa Kramer has dementia and is cared for by her daughter Theresa Kramer.  On 12/24, Theresa Kramer noted a dry cough in her mother which turned productive, she also noticed some subjective dysuria, mild confusion along with a temp of 100.3.  She was diagnosed with a URI/early pneumonia and admitted to the hospital.   Subjective:    Patient in bed, appears comfortable, denies any headache, no fever, no chest pain or pressure, she is short of breath , no abdominal pain. No focal weakness.     Assessment  & Plan :     1.  Toxic encephalopathy caused by URI/early pneumonia.  Patient has been placed on gentle hydration along with empiric antibiotics with good effect, she is close to her baseline although still mildly short of breath, no chest pain.  No clinical suspicion for PE.  Will also rule out influenza.  Continue supportive care, flutter valve added for pulmonary toiletry and encouraged to sit up.  Ambulatory pulse ox monitoring.  UTI has been ruled out, continue to monitor.  2.  CLL.  Undergoing chemo under the care of Dr. Irene Limbo.  3.  Anemia of chronic disease.  Slightly worse due to him dilution, she is symptomatic in terms of shortness of breath and mild tachycardia  will transfuse 1 unit and monitor.  No signs of acute bleed. DW [facility] on call Dr.Mohammad.  4.  Essential hypertension.  Low-dose Lopressor and monitor.  5.  ARF.  Has been hydrated and transfused, now appears to be in fluid overload, Lasix on 10/31/2018 and monitor.  6.  GERD.  On famotidine continue.  7.  Anxiety and depression.  Home medications continued unchanged.  8.  Dyslipidemia.  On statin.  9.  History of stroke.  On statin and Plavix for secondary prevention continue.  10.  New onset A. fib RVR diagnosed on 10/31/2018.  Likely due to distress of fluid overload and CHF.  Placed on Cardizem drip, now transition to oral amiodarone and Lopressor, currently in rate control, TSH stable.  Echo pending.  Cardiology on board.  Mali vas 2 score placed for and has history of CVA.  Placed on Eliquis with caution and monitor.  11.  Acute hypoxic respiratory failure due to acute on chronic diastolic CHF last known EF 60% in 2017.  On 10/31/2018.  High-dose Lasix, rate control, echo pending.  Supplemental oxygen nebulizer treatment.  Monitor closely.     Family Communication  :  daughter  Code Status :  Full  Disposition Plan  :  TBD  Consults  : Cardiology, Dr. Julien Nordmann hematology consulted on 10/30/2018 for the phone.  Procedures  :    Recent CTA 10/06/2018 - no PE  Echocardiogram.  DVT Prophylaxis  :  Lovenox will transition to Eliquis on 10/31/2018 due to paroxysmal A. fib.  Lab Results  Component Value Date   PLT 457 (H) 10/31/2018    Diet :  Diet Order            Diet Carb Modified Fluid consistency: Thin; Room service appropriate? Yes  Diet effective now               Inpatient Medications Scheduled Meds: . sodium chloride   Intravenous Once  . amiodarone  400 mg Oral BID  . apixaban  5 mg Oral BID  . citalopram  20 mg Oral Daily  . enoxaparin (LOVENOX) injection  40 mg Subcutaneous Q24H  . famotidine  10 mg Oral QHS  . insulin aspart  0-15 Units  Subcutaneous TID WC  . insulin aspart  0-5 Units Subcutaneous QHS  . insulin glargine  25 Units Subcutaneous QHS  . LORazepam  0.25 mg Oral Daily  . mouth rinse  15 mL Mouth Rinse BID  . metoprolol tartrate  25 mg Oral BID  . oxybutynin  10 mg Oral QHS  . pramipexole  0.25 mg Oral QHS  . pravastatin  80 mg Oral Daily  . risperiDONE  0.5 mg Oral Q breakfast  . traZODone  50 mg Oral QHS   Continuous Infusions: . azithromycin Stopped (10/30/18 1117)  . cefTRIAXone (ROCEPHIN)  IV 2 g (10/31/18 0911)   PRN Meds:.acetaminophen, diphenhydrAMINE, haloperidol, levalbuterol, ondansetron, traMADol  Antibiotics  :   Anti-infectives (From admission, onward)   Start     Dose/Rate Route Frequency Ordered Stop   10/04/2018 0945  cefTRIAXone (ROCEPHIN) 2 g in sodium chloride 0.9 % 100 mL IVPB     2 g 200 mL/hr over 30 Minutes Intravenous Every 24 hours 10/12/2018 0941     10/04/2018 0945  azithromycin (ZITHROMAX) 500 mg in sodium chloride 0.9 % 250 mL IVPB     500 mg 250 mL/hr over 60 Minutes Intravenous Every 24 hours 10/30/2018 0941            Objective:   Vitals:   10/31/18 0528 10/31/18 0529 10/31/18 0535 10/31/18 0809  BP: 90/66 (!) 88/62  106/86  Pulse: (!) 141 (!) 144    Resp: 16 (!) 27    Temp:   (!) 97.5 F (36.4 C)   TempSrc:   Oral   SpO2: 90% 90%    Weight:      Height:  Wt Readings from Last 3 Encounters:  10/28/2018 83.6 kg  10/12/18 83.5 kg  09/22/18 83.9 kg     Intake/Output Summary (Last 24 hours) at 10/31/2018 1212 Last data filed at 10/31/2018 0557 Gross per 24 hour  Intake 581.67 ml  Output 200 ml  Net 381.67 ml     Physical Exam  Awake Alert, Oriented X 3, No new F.N deficits, Normal affect [place].AT,PERRAL Supple Neck,No JVD, No cervical lymphadenopathy appriciated.  Symmetrical Chest wall movement, Good air movement bilaterally, few crackles iRRR,No Gallops, Rubs or new Murmurs, No Parasternal Heave +ve B.Sounds, Abd Soft, No tenderness, No  organomegaly appriciated, No rebound - guarding or rigidity. No Cyanosis, Clubbing or edema, No new Rash or bruise     Data Review:    CBC Recent Labs  Lab 10/03/2018 0941 10/30/18 0416 10/31/18 0704  WBC 126.4* 148.5* 154.1*  HGB 8.6* 7.7* 8.8*  HCT 29.0* 26.7* 28.7*  PLT 388 371 457*  MCV 85.8 84.5 84.9  MCH 25.4* 24.4* 26.0  MCHC 29.7* 28.8* 30.7  RDW 16.9* 16.8* 17.2*  LYMPHSABS 79.6*  --   --   MONOABS 22.8*  --   --   EOSABS 0.0  --   --   BASOSABS 1.3*  --   --     Chemistries  Recent Labs  Lab 10/24/2018 0941 10/30/18 0416 10/31/18 0704  NA 134* 135 135  K 4.7 4.2 4.9  CL 98 100 100  CO2 21* 21* 19*  GLUCOSE 154* 110* 155*  BUN 31* 35* 41*  CREATININE 1.57* 1.33* 1.38*  CALCIUM 8.7* 8.3* 8.4*  AST 22 18  --   ALT 20 13  --   ALKPHOS 234* 188*  --   BILITOT 1.1 0.3  --    ------------------------------------------------------------------------------------------------------------------ No results for input(s): CHOL, HDL, LDLCALC, TRIG, CHOLHDL, LDLDIRECT in the last 72 hours.  Lab Results  Component Value Date   HGBA1C 8.3 (H) 07/30/2016   ------------------------------------------------------------------------------------------------------------------ Recent Labs    10/30/18 1043  TSH 2.788   ------------------------------------------------------------------------------------------------------------------ No results for input(s): VITAMINB12, FOLATE, FERRITIN, TIBC, IRON, RETICCTPCT in the last 72 hours.  Coagulation profile No results for input(s): INR, PROTIME in the last 168 hours.  No results for input(s): DDIMER in the last 72 hours.  Cardiac Enzymes No results for input(s): CKMB, TROPONINI, MYOGLOBIN in the last 168 hours.  Invalid input(s): CK ------------------------------------------------------------------------------------------------------------------    Component Value Date/Time   BNP 1,676.4 (H) 10/30/2018 1043    Micro  Results Recent Results (from the past 240 hour(s))  Blood Culture (routine x 2)     Status: None (Preliminary result)   Collection Time: 10/27/2018 10:56 AM  Result Value Ref Range Status   Specimen Description BLOOD BLOOD LEFT HAND  Final   Special Requests   Final    BOTTLES DRAWN AEROBIC AND ANAEROBIC Blood Culture results may not be optimal due to an inadequate volume of blood received in culture bottles   Culture   Final    NO GROWTH 2 DAYS Performed at [facility], [place] [address], [place], [place] [address]    Report Status PENDING  Incomplete  Blood Culture (routine x 2)     Status: None (Preliminary result)   Collection Time: 10/05/2018 10:56 AM  Result Value Ref Range Status   Specimen Description BLOOD BLOOD LEFT FOREARM  Final   Special Requests   Final    BOTTLES DRAWN AEROBIC AND ANAEROBIC Blood Culture adequate volume   Culture  Final    NO GROWTH 2 DAYS Performed at [facility], [place] [address], [place], [place] [address]    Report Status PENDING  Incomplete  Culture, Urine     Status: Abnormal   Collection Time: 10/09/2018 12:24 PM  Result Value Ref Range Status   Specimen Description URINE, RANDOM  Final   Special Requests   Final    NONE Performed at [facility], [place] [address] [place], [place] [address]    Culture <10,000 COLONIES/mL INSIGNIFICANT GROWTH (A)  Final   Report Status 10/30/2018 FINAL  Final    Radiology Reports Dg Chest 2 View  Result Date: 10/03/2018 CLINICAL DATA:  Cough and shortness of breath EXAM: CHEST - 2 VIEW COMPARISON:  December 21, 2017 FINDINGS: There is atelectatic change in the lateral left base. The lungs elsewhere are clear. Heart size and pulmonary vascularity are normal. No adenopathy. Postoperative changes noted in the lower cervical spine. Total shoulder replacement noted on the left. There is aortic atherosclerosis. IMPRESSION: Atelectasis lateral left base. This area may represent earliest changes of  pneumonia. Lungs elsewhere clear. No adenopathy appreciable. There is aortic atherosclerosis. Aortic Atherosclerosis (ICD10-I70.0). Electronically Signed   By: Lowella Grip III M.D.   On: 10/08/2018 10:14   Ct Chest W Contrast  Result Date: 10/06/2018 CLINICAL DATA:  CLL/SLL.  Worsening anemia.  Restaging. EXAM: CT CHEST, ABDOMEN, AND PELVIS WITH CONTRAST TECHNIQUE: Multidetector CT imaging of the chest, abdomen and pelvis was performed following the standard protocol during bolus administration of intravenous contrast. CONTRAST:  12mL OMNIPAQUE IOHEXOL 300 MG/ML  SOLN COMPARISON:  08/26/2016 PET-CT. FINDINGS: CT CHEST FINDINGS Cardiovascular: Normal heart size. No significant pericardial effusion/thickening. Three-vessel coronary atherosclerosis. Atherosclerotic nonaneurysmal thoracic aorta. Normal caliber pulmonary arteries. No central pulmonary emboli. Mediastinum/Nodes: No discrete thyroid nodules. Unremarkable esophagus. No pathologically enlarged axillary, mediastinal or hilar lymph nodes. Lungs/Pleura: No pneumothorax. No pleural effusion. Clustered centrilobular nodularity with faint calcifications in the anterior left lower lobe, unchanged since 08/26/2016 PET-CT, compatible with benign postinflammatory nodularity. No acute consolidative airspace disease, lung masses or new significant pulmonary nodules. Musculoskeletal: No aggressive appearing focal osseous lesions. Partially visualized surgical hardware from ACDF in the lower cervical spine. Left total shoulder arthroplasty. Mild thoracic spondylosis. CT ABDOMEN PELVIS FINDINGS Hepatobiliary: Scattered granulomatous calcifications throughout the liver, unchanged. No liver mass. Normal gallbladder with no radiopaque cholelithiasis. No biliary ductal dilatation. Pancreas: Normal, with no mass or duct dilation. Spleen: Marked splenomegaly (craniocaudal splenic length 22.8 cm, increased from 15.7 cm on 08/26/2016 PET-CT). No splenic mass. Scattered  granulomatous splenic calcifications, unchanged. Adrenals/Urinary Tract: Normal adrenals. Normal kidneys with no hydronephrosis and no renal mass. Bladder obscured by streak artifact from the right hip hardware. Bladder appears nondistended and normal. Stomach/Bowel: Small hiatal hernia. Otherwise normal nondistended stomach. Normal caliber small bowel with no small bowel wall thickening. Appendectomy. Moderate sigmoid diverticulosis, no large bowel wall thickening or acute pericolonic fat stranding. Vascular/Lymphatic: Atherosclerotic nonaneurysmal abdominal aorta. Patent portal, splenic, hepatic and renal veins. No pathologically enlarged lymph nodes in the abdomen or pelvis. Reproductive: Status post hysterectomy, with no abnormal findings at the vaginal cuff. No adnexal mass. Other: No pneumoperitoneum, ascites or focal fluid collection. Musculoskeletal: No aggressive appearing focal osseous lesions. Right total hip arthroplasty. Marked lumbar spondylosis. IMPRESSION: 1. Marked splenomegaly, significantly increased in size since 2017 PET-CT. 2. No lymphadenopathy or other sites of lymphoproliferative disease. 3. Three-vessel coronary atherosclerosis. 4.  Aortic Atherosclerosis (ICD10-I70.0). Electronically Signed   By: Ilona Sorrel  M.D.   On: 10/06/2018 15:13   Ct Abdomen Pelvis W Contrast  Result Date: 10/06/2018 CLINICAL DATA:  CLL/SLL.  Worsening anemia.  Restaging. EXAM: CT CHEST, ABDOMEN, AND PELVIS WITH CONTRAST TECHNIQUE: Multidetector CT imaging of the chest, abdomen and pelvis was performed following the standard protocol during bolus administration of intravenous contrast. CONTRAST:  160mL OMNIPAQUE IOHEXOL 300 MG/ML  SOLN COMPARISON:  08/26/2016 PET-CT. FINDINGS: CT CHEST FINDINGS Cardiovascular: Normal heart size. No significant pericardial effusion/thickening. Three-vessel coronary atherosclerosis. Atherosclerotic nonaneurysmal thoracic aorta. Normal caliber pulmonary arteries. No central  pulmonary emboli. Mediastinum/Nodes: No discrete thyroid nodules. Unremarkable esophagus. No pathologically enlarged axillary, mediastinal or hilar lymph nodes. Lungs/Pleura: No pneumothorax. No pleural effusion. Clustered centrilobular nodularity with faint calcifications in the anterior left lower lobe, unchanged since 08/26/2016 PET-CT, compatible with benign postinflammatory nodularity. No acute consolidative airspace disease, lung masses or new significant pulmonary nodules. Musculoskeletal: No aggressive appearing focal osseous lesions. Partially visualized surgical hardware from ACDF in the lower cervical spine. Left total shoulder arthroplasty. Mild thoracic spondylosis. CT ABDOMEN PELVIS FINDINGS Hepatobiliary: Scattered granulomatous calcifications throughout the liver, unchanged. No liver mass. Normal gallbladder with no radiopaque cholelithiasis. No biliary ductal dilatation. Pancreas: Normal, with no mass or duct dilation. Spleen: Marked splenomegaly (craniocaudal splenic length 22.8 cm, increased from 15.7 cm on 08/26/2016 PET-CT). No splenic mass. Scattered granulomatous splenic calcifications, unchanged. Adrenals/Urinary Tract: Normal adrenals. Normal kidneys with no hydronephrosis and no renal mass. Bladder obscured by streak artifact from the right hip hardware. Bladder appears nondistended and normal. Stomach/Bowel: Small hiatal hernia. Otherwise normal nondistended stomach. Normal caliber small bowel with no small bowel wall thickening. Appendectomy. Moderate sigmoid diverticulosis, no large bowel wall thickening or acute pericolonic fat stranding. Vascular/Lymphatic: Atherosclerotic nonaneurysmal abdominal aorta. Patent portal, splenic, hepatic and renal veins. No pathologically enlarged lymph nodes in the abdomen or pelvis. Reproductive: Status post hysterectomy, with no abnormal findings at the vaginal cuff. No adnexal mass. Other: No pneumoperitoneum, ascites or focal fluid collection.  Musculoskeletal: No aggressive appearing focal osseous lesions. Right total hip arthroplasty. Marked lumbar spondylosis. IMPRESSION: 1. Marked splenomegaly, significantly increased in size since 2017 PET-CT. 2. No lymphadenopathy or other sites of lymphoproliferative disease. 3. Three-vessel coronary atherosclerosis. 4.  Aortic Atherosclerosis (ICD10-I70.0). Electronically Signed   By: Ilona Sorrel M.D.   On: 10/06/2018 15:13   Dg Chest Port 1 View  Result Date: 10/31/2018 CLINICAL DATA:  Acute onset of shortness of breath. EXAM: PORTABLE CHEST 1 VIEW COMPARISON:  Chest radiograph performed 10/30/2018 FINDINGS: The lungs are well-aerated. Vascular congestion is noted. Bilateral perihilar and left basilar airspace opacities raise concern for pulmonary edema. A small left pleural effusion is noted. No pneumothorax is seen. The cardiomediastinal silhouette is borderline enlarged. No acute osseous abnormalities are seen. A left-sided shoulder arthroplasty is grossly unremarkable in appearance, though incompletely imaged. Cervical spinal fusion hardware is partially imaged. IMPRESSION: Vascular congestion and borderline cardiomegaly. Bilateral perihilar and left basilar airspace opacities raise concern for pulmonary edema. Small left pleural effusion noted. Electronically Signed   By: Garald Balding M.D.   On: 10/31/2018 05:57    Time Spent in minutes  30   Lala Lund M.D on 10/31/2018 at 12:12 PM  To page go to [email or web address] - password [email or web address]

## 2018-10-31 NOTE — Progress Notes (Signed)
Responded to call from central tele that pt's HR was in the 150's-160's.  Pt found to be slouched down in chair. Vitals T 97.8, BP 119/56 (75) P 136, RR 28, SpO2 88% @ 5L [place].  Obtained 12-lead EKG, which showed a. Fib w/ RVR.  Paged [facility] provider Jeannette Corpus, who ordered cardizem IV 10mg .  Will continue to monitor.

## 2018-10-31 NOTE — Discharge Instructions (Signed)

## 2018-10-31 NOTE — Progress Notes (Signed)
Paged by bedside RN with concerns about increased HR and oxygenation. On assessment, pt found to be in new onset afib RVR, sats mid 80's on [place], and hypertensive. Wheezing throughout noted on auscultation. Mental status at baseline. Will transfer pt to SDU status for closer monitoring.  Afib w/ RVR -Bedside EKG - Attempted metoprolol, cardizem and digoxin IV push with no change in HR. Will need transfer to SDU status and start anti-arrhythmic gtt. Consult cardiology for new onset.    SOB/Wheezing - Chest xray shows pulmonary edema. Pt given additional IV dose of Lasix.  - Pt placed on Hi-flo [place]. Sats improved to mid 90's. -ABG -Xopenex tx q8hr prn  [name], NP [facility] 7p-7a [phone]

## 2018-11-01 ENCOUNTER — Inpatient Hospital Stay (HOSPITAL_COMMUNITY): Payer: Medicare Other

## 2018-11-01 DIAGNOSIS — I10 Essential (primary) hypertension: Secondary | ICD-10-CM

## 2018-11-01 DIAGNOSIS — J9601 Acute respiratory failure with hypoxia: Secondary | ICD-10-CM

## 2018-11-01 LAB — BASIC METABOLIC PANEL
Anion gap: 12 (ref 5–15)
BUN: 43 mg/dL — ABNORMAL HIGH (ref 8–23)
CO2: 22 mmol/L (ref 22–32)
Calcium: 8.3 mg/dL — ABNORMAL LOW (ref 8.9–10.3)
Chloride: 101 mmol/L (ref 98–111)
Creatinine, Ser: 1.4 mg/dL — ABNORMAL HIGH (ref 0.44–1.00)
GFR calc Af Amer: 42 mL/min — ABNORMAL LOW (ref >60)
GFR calc non Af Amer: 36 mL/min — ABNORMAL LOW (ref >60)
Glucose, Bld: 135 mg/dL — ABNORMAL HIGH (ref 70–99)
Potassium: 4.7 mmol/L (ref 3.5–5.1)
Sodium: 135 mmol/L (ref 135–145)

## 2018-11-01 LAB — CBC
HCT: 29.3 % — ABNORMAL LOW (ref 36.0–46.0)
Hemoglobin: 9 g/dL — ABNORMAL LOW (ref 12.0–15.0)
MCH: 25.9 pg — ABNORMAL LOW (ref 26.0–34.0)
MCHC: 30.7 g/dL (ref 30.0–36.0)
MCV: 84.2 fL (ref 80.0–100.0)
Platelets: 486 10*3/uL — ABNORMAL HIGH (ref 150–400)
RBC: 3.48 MIL/uL — ABNORMAL LOW (ref 3.87–5.11)
RDW: 17.5 % — ABNORMAL HIGH (ref 11.5–15.5)
WBC: 156.3 10*3/uL (ref 4.0–10.5)
nRBC: 0 % (ref 0.0–0.2)

## 2018-11-01 LAB — GLUCOSE, CAPILLARY
GLUCOSE-CAPILLARY: 118 mg/dL — AB (ref 70–99)
Glucose-Capillary: 127 mg/dL — ABNORMAL HIGH (ref 70–99)
Glucose-Capillary: 148 mg/dL — ABNORMAL HIGH (ref 70–99)
Glucose-Capillary: 145 mg/dL — ABNORMAL HIGH (ref 70–99)

## 2018-11-01 LAB — MAGNESIUM: MAGNESIUM: 2.2 mg/dL (ref 1.7–2.4)

## 2018-11-01 MED ORDER — HALOPERIDOL LACTATE 5 MG/ML IJ SOLN
5.0000 mg | Freq: Once | INTRAMUSCULAR | Status: AC
  Administered 2018-11-01: 5 mg via INTRAVENOUS

## 2018-11-01 MED ORDER — AMLODIPINE BESYLATE 10 MG PO TABS
10.0000 mg | ORAL_TABLET | Freq: Every day | ORAL | Status: DC
  Administered 2018-11-01: 10 mg via ORAL
  Filled 2018-11-01: qty 1

## 2018-11-01 MED ORDER — METOLAZONE 2.5 MG PO TABS
2.5000 mg | ORAL_TABLET | Freq: Once | ORAL | Status: DC
  Filled 2018-11-01: qty 1

## 2018-11-01 MED ORDER — METOPROLOL TARTRATE 25 MG PO TABS
25.0000 mg | ORAL_TABLET | Freq: Two times a day (BID) | ORAL | Status: DC
  Filled 2018-11-01: qty 1

## 2018-11-01 MED ORDER — AZITHROMYCIN 500 MG PO TABS: 500.0000 mg | ORAL_TABLET | Freq: Every day | ORAL | Status: DC

## 2018-11-01 MED ORDER — FUROSEMIDE 10 MG/ML IJ SOLN
80.0000 mg | Freq: Two times a day (BID) | INTRAMUSCULAR | Status: DC
  Administered 2018-11-01 – 2018-11-02 (×2): 80 mg via INTRAVENOUS
  Filled 2018-11-01 (×2): qty 8

## 2018-11-01 MED ORDER — HALOPERIDOL LACTATE 5 MG/ML IJ SOLN
2.0000 mg | Freq: Four times a day (QID) | INTRAMUSCULAR | Status: DC | PRN
  Administered 2018-11-01 (×2): 2 mg via INTRAVENOUS
  Filled 2018-11-01 (×2): qty 1

## 2018-11-01 MED ORDER — METOPROLOL TARTRATE 50 MG PO TABS: 50.0000 mg | ORAL_TABLET | Freq: Two times a day (BID) | ORAL | Status: DC

## 2018-11-01 MED ORDER — METOLAZONE 2.5 MG PO TABS
2.5000 mg | ORAL_TABLET | Freq: Once | ORAL | Status: AC
  Administered 2018-11-01: 2.5 mg via ORAL
  Filled 2018-11-01: qty 1

## 2018-11-01 MED ORDER — FUROSEMIDE 10 MG/ML IJ SOLN: 40.0000 mg | Freq: Two times a day (BID) | INTRAMUSCULAR | Status: DC

## 2018-11-01 MED ORDER — METOPROLOL TARTRATE 25 MG PO TABS: 25.0000 mg | ORAL_TABLET | Freq: Once | ORAL | Status: DC

## 2018-11-01 MED ORDER — FUROSEMIDE 10 MG/ML IJ SOLN: 80.0000 mg | Freq: Two times a day (BID) | INTRAMUSCULAR | Status: DC

## 2018-11-01 MED ORDER — FUROSEMIDE 10 MG/ML IJ SOLN: 40.0000 mg | Freq: Once | INTRAMUSCULAR | Status: DC

## 2018-11-01 MED ORDER — FUROSEMIDE 10 MG/ML IJ SOLN
60.0000 mg | Freq: Once | INTRAMUSCULAR | Status: AC
  Administered 2018-11-01: 60 mg via INTRAVENOUS
  Filled 2018-11-01: qty 6

## 2018-11-01 NOTE — Progress Notes (Signed)
Pt found to be hypoxic with sats in the low 80's.  Pt is restless but denies any SOB.  Mild audible wheezing noted.  Gave a xopenex breathing treatment at 0202, with minimal effect.  Transitioned pt to NRB and paged Rapid Response, who assessed pt.  Pt again denied any SOB or chest pain.  Just restless and cannot explain why.  Vitals BP 126/64 (80), P 88, RR 25, SaO2 91% on NRB.  Paged [facility] provider Jeannette Corpus, who instructed to leave pt on NRB and page again if it becomes ineffective.  Will continue to monitor closely.

## 2018-11-01 NOTE — Significant Event (Signed)
Rapid Response Event Note  Overview: Nursing staff called regarding progressive decline in respiratory status and oxygen saturations in low 80s while on HFNC 12L  Initial Focused Assessment: Upon arrival, Ms. Bernick is lying in the bed in a semifowlers position.  She appears to have mild distress with some accessory muscle use.  She is alert and oriented x4. She denies SOB, any difficulty breathing and CP.  BBS Coarse with some slight expiratory wheeze.  Skin is warm, pink and diaphoretic.  She appears anxious and constantly moving her hands and lower legs.  She states she cannot lay still.  After placing NRB mask, her saturations improved to 93-94%.  RR 25.   Interventions: -NRB mask  Plan of Care (if not transferred): -Notify primary svc of events and further orders -please call if further assistance needed  Event Summary: Pt stayed in room  Jeannette Corpus APP notified per nursing staff  Call ended [phone]  Madelynn Done

## 2018-11-01 NOTE — Progress Notes (Signed)
RT called by rapid response RN to place patient on bipap.  Patient placed on bipap and is currently tolerating well.  Will continue to monitor.

## 2018-11-01 NOTE — Progress Notes (Signed)
PROGRESS NOTE                                                                                                                                                                                                             Patient Demographics:    Theresa Kramer, is a 77 y.o. female, DOB - April 30, 1941, [id number]  Admit date - 10/22/2018   Admitting Physician Sid Falcon, MD  Outpatient Primary MD for the patient is Asencion Noble, MD  LOS - 3  Chief Complaint  Patient presents with  . Urinary Tract Infection       Brief Narrative  Theresa Kramer is a 77 y.o. female with medical history significant of CLL, Dementia, h/o stroke, HTN, HLD, DM2, GERD, Depression, agitation, B12 deficiency, arthritis who presents for AMS and cough.  [name] has dementia and is cared for by her daughter [name].  On 12/24, [name] noted a dry cough in her mother which turned productive, she also noticed some subjective dysuria, mild confusion along with a temp of 100.3.  She was diagnosed with a URI/early pneumonia and admitted to the hospital.   Subjective:   Patient in bed, appears comfortable, denies any headache, no fever, no chest pain or pressure,+ve shortness of breath , no abdominal pain. No focal weakness.     Assessment  & Plan :     1.  Toxic encephalopathy caused by URI vs CHF.  Placed on empiric antibiotics and IV fluids, however despite appropriate treatment continue to be quite short of breath, further work-up suggested that her proBNP was quite elevated suggesting some underlying CHF as well, she has since been placed on IV Lasix, antibiotics have been titrated down to oral azithromycin on 10/22/2018, all cultures are negative.  Suspicion is higher for CHF than for atypical URI.  Continue diuresis, added flutter valve for pulmonary toiletry, continue oxygen supplementation, monitor intake output along with electrolytes.  Cardiology on board as well.  2.  CLL.  Undergoing chemo under  the care of Dr. Irene Limbo.  3.  Anemia of chronic disease.  Slightly worse due to him dilution, she is symptomatic in terms of shortness of breath and mild tachycardia will transfuse 1 unit and monitor.  No signs of acute bleed. DW Onco on call Dr.Mohammad.  Posttransfusion H&H stable.  4.  Essential hypertension.  Low-dose Lopressor and monitor.  5.  ARF.  Has been hydrated and transfused, now appears to be in fluid overload, started diuresis will continue to  monitor.  6.  GERD.  On famotidine continue.  7.  Anxiety and depression.  Home medications continued unchanged.  8.  Dyslipidemia.  On statin.  9.  History of stroke.  On statin and Plavix for secondary prevention continue.  10.  New onset A. fib RVR diagnosed on 10/31/2018.  Likely due to distress of fluid overload and CHF.  Placed on Cardizem drip, now transition to oral amiodarone and Lopressor, have increased Lopressor dose on 11/01/2018 for better blood pressure and heart rate control, TSH stable.  Echo pending.  Cardiology on board.  Mali vas 2 score placed for and has history of CVA.  Placed on Eliquis with caution and monitor.  11.  Acute hypoxic respiratory failure due to acute on chronic diastolic CHF last known EF 60% in 2017.  On 10/31/2018.  High-dose Lasix, rate control, echo pending.  Supplemental oxygen nebulizer treatment.  Monitor closely.     Family Communication  :  daughter  Code Status :  Full  Disposition Plan  :  TBD  Consults  : Cardiology, Dr. Julien Nordmann hematology consulted on 10/30/2018 for the phone.  Procedures  :    Recent CTA 10/06/2018 - no PE  Echocardiogram.  DVT Prophylaxis  :  Lovenox will transition to Eliquis on 10/31/2018 due to paroxysmal A. fib.  Lab Results  Component Value Date   PLT 486 (H) 11/01/2018    Diet :  Diet Order            Diet Carb Modified Fluid consistency: Thin; Room service appropriate? Yes  Diet effective now               Inpatient  Medications Scheduled Meds: . sodium chloride   Intravenous Once  . amiodarone  400 mg Oral BID  . amLODipine  10 mg Oral Daily  . apixaban  5 mg Oral BID  . azithromycin  500 mg Oral Daily  . citalopram  20 mg Oral Daily  . famotidine  10 mg Oral QHS  . insulin aspart  0-15 Units Subcutaneous TID WC  . insulin aspart  0-5 Units Subcutaneous QHS  . insulin glargine  25 Units Subcutaneous QHS  . LORazepam  0.25 mg Oral Daily  . mouth rinse  15 mL Mouth Rinse BID  . metolazone  2.5 mg Oral Once  . metoprolol tartrate  25 mg Oral Once  . metoprolol tartrate  50 mg Oral BID  . oxybutynin  10 mg Oral QHS  . pramipexole  0.25 mg Oral QHS  . pravastatin  80 mg Oral Daily  . risperiDONE  0.5 mg Oral Q breakfast  . traZODone  50 mg Oral QHS   Continuous Infusions:  PRN Meds:.acetaminophen, diphenhydrAMINE, haloperidol, levalbuterol, ondansetron, traMADol  Antibiotics  :   Anti-infectives (From admission, onward)   Start     Dose/Rate Route Frequency Ordered Stop   11/01/18 1015  azithromycin (ZITHROMAX) tablet 500 mg     500 mg Oral Daily 11/01/18 1007     10/07/2018 0945  cefTRIAXone (ROCEPHIN) 2 g in sodium chloride 0.9 % 100 mL IVPB  Status:  Discontinued     2 g 200 mL/hr over 30 Minutes Intravenous Every 24 hours 11/01/2018 0941 11/01/18 1007   10/15/2018 0945  azithromycin (ZITHROMAX) 500 mg in sodium chloride 0.9 % 250 mL IVPB  Status:  Discontinued     500 mg 250 mL/hr over 60 Minutes Intravenous Every 24 hours 10/27/2018 0941 11/01/18 1007  Objective:   Vitals:   10/31/18 2122 11/01/18 0223 11/01/18 0405 11/01/18 0813  BP: (!) 106/57 126/64 (!) 113/100   Pulse: 90 88 92   Resp: 20 (!) 25 (!) 28   Temp: 99 F (37.2 C)  98.1 F (36.7 C) 99.1 F (37.3 C)  TempSrc: Oral  Oral Axillary  SpO2: 92% 91% 91%   Weight:      Height:        Wt Readings from Last 3 Encounters:  10/21/2018 83.6 kg  10/12/18 83.5 kg  09/22/18 83.9 kg     Intake/Output Summary (Last  24 hours) at 11/01/2018 1028 Last data filed at 11/01/2018 0558 Gross per 24 hour  Intake 491.09 ml  Output 2100 ml  Net -1608.91 ml     Physical Exam  Awake Alert, No new F.N deficits, Normal affect [place].AT,PERRAL Supple Neck,No JVD, No cervical lymphadenopathy appriciated.  Symmetrical Chest wall movement, Good air movement bilaterally, +ve rales RRR,No Gallops, Rubs or new Murmurs, No Parasternal Heave +ve B.Sounds, Abd Soft, No tenderness, No organomegaly appriciated, No rebound - guarding or rigidity. No Cyanosis, Clubbing or edema, No new Rash or bruise    Data Review:    CBC Recent Labs  Lab 10/21/2018 0941 10/30/18 0416 10/31/18 0704 11/01/18 0417  WBC 126.4* 148.5* 154.1* 156.3*  HGB 8.6* 7.7* 8.8* 9.0*  HCT 29.0* 26.7* 28.7* 29.3*  PLT 388 371 457* 486*  MCV 85.8 84.5 84.9 84.2  MCH 25.4* 24.4* 26.0 25.9*  MCHC 29.7* 28.8* 30.7 30.7  RDW 16.9* 16.8* 17.2* 17.5*  LYMPHSABS 79.6*  --   --   --   MONOABS 22.8*  --   --   --   EOSABS 0.0  --   --   --   BASOSABS 1.3*  --   --   --     Chemistries  Recent Labs  Lab 10/08/2018 0941 10/30/18 0416 10/31/18 0704 11/01/18 0417  NA 134* 135 135 135  K 4.7 4.2 4.9 4.7  CL 98 100 100 101  CO2 21* 21* 19* 22  GLUCOSE 154* 110* 155* 135*  BUN 31* 35* 41* 43*  CREATININE 1.57* 1.33* 1.38* 1.40*  CALCIUM 8.7* 8.3* 8.4* 8.3*  MG  --   --   --  2.2  AST 22 18  --   --   ALT 20 13  --   --   ALKPHOS 234* 188*  --   --   BILITOT 1.1 0.3  --   --    ------------------------------------------------------------------------------------------------------------------ No results for input(s): CHOL, HDL, LDLCALC, TRIG, CHOLHDL, LDLDIRECT in the last 72 hours.  Lab Results  Component Value Date   HGBA1C 8.3 (H) 07/30/2016   ------------------------------------------------------------------------------------------------------------------ Recent Labs    10/30/18 1043  TSH 2.788    ------------------------------------------------------------------------------------------------------------------ No results for input(s): VITAMINB12, FOLATE, FERRITIN, TIBC, IRON, RETICCTPCT in the last 72 hours.  Coagulation profile No results for input(s): INR, PROTIME in the last 168 hours.  No results for input(s): DDIMER in the last 72 hours.  Cardiac Enzymes No results for input(s): CKMB, TROPONINI, MYOGLOBIN in the last 168 hours.  Invalid input(s): CK ------------------------------------------------------------------------------------------------------------------    Component Value Date/Time   BNP 1,676.4 (H) 10/30/2018 1043    Micro Results Recent Results (from the past 240 hour(s))  Blood Culture (routine x 2)     Status: None (Preliminary result)   Collection Time: 10/03/2018 10:56 AM  Result Value Ref Range Status   Specimen Description BLOOD BLOOD LEFT  HAND  Final   Special Requests   Final    BOTTLES DRAWN AEROBIC AND ANAEROBIC Blood Culture results may not be optimal due to an inadequate volume of blood received in culture bottles   Culture   Final    NO GROWTH 2 DAYS Performed at [facility], [place] [address], [place], [place] [address]    Report Status PENDING  Incomplete  Blood Culture (routine x 2)     Status: None (Preliminary result)   Collection Time: 10/18/2018 10:56 AM  Result Value Ref Range Status   Specimen Description BLOOD BLOOD LEFT FOREARM  Final   Special Requests   Final    BOTTLES DRAWN AEROBIC AND ANAEROBIC Blood Culture adequate volume   Culture   Final    NO GROWTH 2 DAYS Performed at [facility], [place] [address], [place], [place] [address]    Report Status PENDING  Incomplete  Culture, Urine     Status: Abnormal   Collection Time: 10/24/2018 12:24 PM  Result Value Ref Range Status   Specimen Description URINE, RANDOM  Final   Special Requests   Final    NONE Performed at [facility], [place] [address],  [place], [place] [address]    Culture <10,000 COLONIES/mL INSIGNIFICANT GROWTH (A)  Final   Report Status 10/30/2018 FINAL  Final    Radiology Reports Dg Chest 2 View  Result Date: 10/25/2018 CLINICAL DATA:  Cough and shortness of breath EXAM: CHEST - 2 VIEW COMPARISON:  December 21, 2017 FINDINGS: There is atelectatic change in the lateral left base. The lungs elsewhere are clear. Heart size and pulmonary vascularity are normal. No adenopathy. Postoperative changes noted in the lower cervical spine. Total shoulder replacement noted on the left. There is aortic atherosclerosis. IMPRESSION: Atelectasis lateral left base. This area may represent earliest changes of pneumonia. Lungs elsewhere clear. No adenopathy appreciable. There is aortic atherosclerosis. Aortic Atherosclerosis (ICD10-I70.0). Electronically Signed   By: Lowella Grip III M.D.   On: 10/28/2018 10:14   Ct Chest W Contrast  Result Date: 10/06/2018 CLINICAL DATA:  CLL/SLL.  Worsening anemia.  Restaging. EXAM: CT CHEST, ABDOMEN, AND PELVIS WITH CONTRAST TECHNIQUE: Multidetector CT imaging of the chest, abdomen and pelvis was performed following the standard protocol during bolus administration of intravenous contrast. CONTRAST:  137mL OMNIPAQUE IOHEXOL 300 MG/ML  SOLN COMPARISON:  08/26/2016 PET-CT. FINDINGS: CT CHEST FINDINGS Cardiovascular: Normal heart size. No significant pericardial effusion/thickening. Three-vessel coronary atherosclerosis. Atherosclerotic nonaneurysmal thoracic aorta. Normal caliber pulmonary arteries. No central pulmonary emboli. Mediastinum/Nodes: No discrete thyroid nodules. Unremarkable esophagus. No pathologically enlarged axillary, mediastinal or hilar lymph nodes. Lungs/Pleura: No pneumothorax. No pleural effusion. Clustered centrilobular nodularity with faint calcifications in the anterior left lower lobe, unchanged since 08/26/2016 PET-CT, compatible with benign postinflammatory nodularity. No acute  consolidative airspace disease, lung masses or new significant pulmonary nodules. Musculoskeletal: No aggressive appearing focal osseous lesions. Partially visualized surgical hardware from ACDF in the lower cervical spine. Left total shoulder arthroplasty. Mild thoracic spondylosis. CT ABDOMEN PELVIS FINDINGS Hepatobiliary: Scattered granulomatous calcifications throughout the liver, unchanged. No liver mass. Normal gallbladder with no radiopaque cholelithiasis. No biliary ductal dilatation. Pancreas: Normal, with no mass or duct dilation. Spleen: Marked splenomegaly (craniocaudal splenic length 22.8 cm, increased from 15.7 cm on 08/26/2016 PET-CT). No splenic mass. Scattered granulomatous splenic calcifications, unchanged. Adrenals/Urinary Tract: Normal adrenals. Normal kidneys with no hydronephrosis and no renal mass. Bladder obscured by streak artifact from the right hip hardware. Bladder appears  nondistended and normal. Stomach/Bowel: Small hiatal hernia. Otherwise normal nondistended stomach. Normal caliber small bowel with no small bowel wall thickening. Appendectomy. Moderate sigmoid diverticulosis, no large bowel wall thickening or acute pericolonic fat stranding. Vascular/Lymphatic: Atherosclerotic nonaneurysmal abdominal aorta. Patent portal, splenic, hepatic and renal veins. No pathologically enlarged lymph nodes in the abdomen or pelvis. Reproductive: Status post hysterectomy, with no abnormal findings at the vaginal cuff. No adnexal mass. Other: No pneumoperitoneum, ascites or focal fluid collection. Musculoskeletal: No aggressive appearing focal osseous lesions. Right total hip arthroplasty. Marked lumbar spondylosis. IMPRESSION: 1. Marked splenomegaly, significantly increased in size since 2017 PET-CT. 2. No lymphadenopathy or other sites of lymphoproliferative disease. 3. Three-vessel coronary atherosclerosis. 4.  Aortic Atherosclerosis (ICD10-I70.0). Electronically Signed   By: Ilona Sorrel M.D.    On: 10/06/2018 15:13   Ct Abdomen Pelvis W Contrast  Result Date: 10/06/2018 CLINICAL DATA:  CLL/SLL.  Worsening anemia.  Restaging. EXAM: CT CHEST, ABDOMEN, AND PELVIS WITH CONTRAST TECHNIQUE: Multidetector CT imaging of the chest, abdomen and pelvis was performed following the standard protocol during bolus administration of intravenous contrast. CONTRAST:  141mL OMNIPAQUE IOHEXOL 300 MG/ML  SOLN COMPARISON:  08/26/2016 PET-CT. FINDINGS: CT CHEST FINDINGS Cardiovascular: Normal heart size. No significant pericardial effusion/thickening. Three-vessel coronary atherosclerosis. Atherosclerotic nonaneurysmal thoracic aorta. Normal caliber pulmonary arteries. No central pulmonary emboli. Mediastinum/Nodes: No discrete thyroid nodules. Unremarkable esophagus. No pathologically enlarged axillary, mediastinal or hilar lymph nodes. Lungs/Pleura: No pneumothorax. No pleural effusion. Clustered centrilobular nodularity with faint calcifications in the anterior left lower lobe, unchanged since 08/26/2016 PET-CT, compatible with benign postinflammatory nodularity. No acute consolidative airspace disease, lung masses or new significant pulmonary nodules. Musculoskeletal: No aggressive appearing focal osseous lesions. Partially visualized surgical hardware from ACDF in the lower cervical spine. Left total shoulder arthroplasty. Mild thoracic spondylosis. CT ABDOMEN PELVIS FINDINGS Hepatobiliary: Scattered granulomatous calcifications throughout the liver, unchanged. No liver mass. Normal gallbladder with no radiopaque cholelithiasis. No biliary ductal dilatation. Pancreas: Normal, with no mass or duct dilation. Spleen: Marked splenomegaly (craniocaudal splenic length 22.8 cm, increased from 15.7 cm on 08/26/2016 PET-CT). No splenic mass. Scattered granulomatous splenic calcifications, unchanged. Adrenals/Urinary Tract: Normal adrenals. Normal kidneys with no hydronephrosis and no renal mass. Bladder obscured by streak  artifact from the right hip hardware. Bladder appears nondistended and normal. Stomach/Bowel: Small hiatal hernia. Otherwise normal nondistended stomach. Normal caliber small bowel with no small bowel wall thickening. Appendectomy. Moderate sigmoid diverticulosis, no large bowel wall thickening or acute pericolonic fat stranding. Vascular/Lymphatic: Atherosclerotic nonaneurysmal abdominal aorta. Patent portal, splenic, hepatic and renal veins. No pathologically enlarged lymph nodes in the abdomen or pelvis. Reproductive: Status post hysterectomy, with no abnormal findings at the vaginal cuff. No adnexal mass. Other: No pneumoperitoneum, ascites or focal fluid collection. Musculoskeletal: No aggressive appearing focal osseous lesions. Right total hip arthroplasty. Marked lumbar spondylosis. IMPRESSION: 1. Marked splenomegaly, significantly increased in size since 2017 PET-CT. 2. No lymphadenopathy or other sites of lymphoproliferative disease. 3. Three-vessel coronary atherosclerosis. 4.  Aortic Atherosclerosis (ICD10-I70.0). Electronically Signed   By: Ilona Sorrel M.D.   On: 10/06/2018 15:13   Dg Chest Port 1 View  Result Date: 11/01/2018 CLINICAL DATA:  Shortness of breath. EXAM: PORTABLE CHEST 1 VIEW COMPARISON:  PA and lateral chest 10/23/2018. Single-view of the chest 10/31/2017. FINDINGS: Extensive bilateral perihilar airspace disease has worsened since yesterday's examination. Small to moderate left pleural effusion and left basilar airspace disease have also increased. Trace right pleural effusion is seen. There is cardiomegaly. Aortic atherosclerosis noted. No pneumothorax.  No focal bony abnormality. IMPRESSION: Worsened bilateral airspace disease has an appearance most consistent with increased pulmonary edema. Small to moderate left pleural effusion is also increased. Trace right pleural effusion noted. Electronically Signed   By: Inge Rise M.D.   On: 11/01/2018 08:41   Dg Chest Port 1  View  Result Date: 10/31/2018 CLINICAL DATA:  Acute onset of shortness of breath. EXAM: PORTABLE CHEST 1 VIEW COMPARISON:  Chest radiograph performed 10/18/2018 FINDINGS: The lungs are well-aerated. Vascular congestion is noted. Bilateral perihilar and left basilar airspace opacities raise concern for pulmonary edema. A small left pleural effusion is noted. No pneumothorax is seen. The cardiomediastinal silhouette is borderline enlarged. No acute osseous abnormalities are seen. A left-sided shoulder arthroplasty is grossly unremarkable in appearance, though incompletely imaged. Cervical spinal fusion hardware is partially imaged. IMPRESSION: Vascular congestion and borderline cardiomegaly. Bilateral perihilar and left basilar airspace opacities raise concern for pulmonary edema. Small left pleural effusion noted. Electronically Signed   By: Garald Balding M.D.   On: 10/31/2018 05:57    Time Spent in minutes  30   Lala Lund M.D on 11/01/2018 at 10:28 AM  To page go to [email or web address] - password [email or web address]

## 2018-11-01 NOTE — Significant Event (Signed)
Rapid Response Event Note  Overview:  Called to assist with increased WOB Time Called: 1703 Arrival Time: 1705 Event Type: Respiratory  Initial Focused Assessment:  On arrival patient supine in bed - warm and moist - responds to voice - can cough on demand but very sleepy  - abdominal breathing with accessory muscle use - bil BS present - RUL fine crackles - coarse crackles Left lung in all fields - RLL.  RR 28 - Abd soft.  No nausea today per RN Bea who is at the bedside.  O2 sats 87-88.  Monitor with SR with frequent PAC's noted.  Family at bedside - they have had discussion with Dr. Candiss Norse - willing to try Bipap.     Interventions:  Suction set up in room . RT Brandy at bedside - initiated Bipap 10/14 with 70 % FiO2.  Patient tolerated well - resps regular without accessory use - O2 sats 94% - pulling good TV - bil BS  - resting with relief of restlessness - was given 5 mg IV Haldol on RRT team arrival per MD order.  BP 94/64 with MAP 60 RR 20. Discussion with family regarding Bipap and goals of treatment - questions answered - support given.  Will follow. Handoff to [employer].    Plan of Care (if not transferred):  Event Summary: Name of Physician Notified: Dr. Candiss Norse at (pta RRT)    at    Outcome: Stayed in room and stabalized  Event End Time: [place]  Quin Hoop

## 2018-11-01 NOTE — Progress Notes (Signed)
PT Cancellation Note  Patient Details Name: Theresa Kramer MRN: [id number] DOB: [birth year]   Cancelled Treatment:    Reason Eval/Treat Not Completed: Medical issues which prohibited therapy Noted rapid response event and spoke to RN regarding patient status- per RN, patient remains medically unstable this morning and she requests that PT hold for today. Will attempt to try back on next day of service.    Deniece Ree PT, DPT, CBIS  Supplemental Physical Therapist [facility]    Pager [phone] Acute Rehab Office [phone]

## 2018-11-01 NOTE — Progress Notes (Signed)
Progress Note  Patient Name: Theresa Kramer Date of Encounter: 11/01/2018  Primary Cardiologist: New- Dr. Wyatt Haste, MD  Subjective   Pt continues to be moderately confused this AM. Daughter at bedside. Breathing improved, however remains very fluid volume overloaded on exam. NSR today with rates controlled.  Inpatient Medications    Scheduled Meds: . sodium chloride   Intravenous Once  . amiodarone  400 mg Oral BID  . apixaban  5 mg Oral BID  . citalopram  20 mg Oral Daily  . famotidine  10 mg Oral QHS  . insulin aspart  0-15 Units Subcutaneous TID WC  . insulin aspart  0-5 Units Subcutaneous QHS  . insulin glargine  25 Units Subcutaneous QHS  . LORazepam  0.25 mg Oral Daily  . mouth rinse  15 mL Mouth Rinse BID  . metoprolol tartrate  25 mg Oral BID  . oxybutynin  10 mg Oral QHS  . pramipexole  0.25 mg Oral QHS  . pravastatin  80 mg Oral Daily  . risperiDONE  0.5 mg Oral Q breakfast  . traZODone  50 mg Oral QHS   Continuous Infusions: . azithromycin 500 mg (11/01/18 0914)  . cefTRIAXone (ROCEPHIN)  IV 2 g (11/01/18 0912)   PRN Meds: acetaminophen, diphenhydrAMINE, haloperidol, levalbuterol, ondansetron, traMADol   Vital Signs    Vitals:   10/31/18 2122 11/01/18 0223 11/01/18 0405 11/01/18 0813  BP: (!) 106/57 126/64 (!) 113/100   Pulse: 90 88 92   Resp: 20 (!) 25 (!) 28   Temp: 99 F (37.2 C)  98.1 F (36.7 C) 99.1 F (37.3 C)  TempSrc: Oral  Oral Axillary  SpO2: 92% 91% 91%   Weight:      Height:        Intake/Output Summary (Last 24 hours) at 11/01/2018 0924 Last data filed at 11/01/2018 0558 Gross per 24 hour  Intake 491.09 ml  Output 2100 ml  Net -1608.91 ml   Filed Weights   10/21/2018 1824  Weight: 83.6 kg    Physical Exam   General: Elderly, NAD Skin: Warm, dry, intact  Head: Normocephalic, atraumatic, clear, moist mucus membranes. Neck: Negative for carotid bruits. No JVD Lungs: Bilateral rhonchi with expiratory wheezing.  Breathing is unlabored. Cardiovascular: RRR with S1 S2. + murmurs. No rubs, gallops, or LV heave appreciated. Abdomen: Soft, non-tender, non-distended with normoactive bowel sounds. No obvious abdominal masses. MSK: Strength and tone appear normal for age. 5/5 in all extremities Extremities: No edema. No clubbing or cyanosis. DP/PT pulses 2+ bilaterally Neuro: Alert , disoriented to place, time. No focal deficits. No facial asymmetry. MAE spontaneously. Psych: Responds to questions somewhat appropriately with normal affect.    Labs    Chemistry Recent Labs  Lab 10/06/2018 0941 10/30/18 0416 10/31/18 0704 11/01/18 0417  NA 134* 135 135 135  K 4.7 4.2 4.9 4.7  CL 98 100 100 101  CO2 21* 21* 19* 22  GLUCOSE 154* 110* 155* 135*  BUN 31* 35* 41* 43*  CREATININE 1.57* 1.33* 1.38* 1.40*  CALCIUM 8.7* 8.3* 8.4* 8.3*  PROT 7.8 7.3  --   --   ALBUMIN 2.9* 2.5*  --   --   AST 22 18  --   --   ALT 20 13  --   --   ALKPHOS 234* 188*  --   --   BILITOT 1.1 0.3  --   --   GFRNONAA 31* 38* 37* 36*  GFRAA 36* 44*  42* 42*  ANIONGAP 15 14 16* 12     Hematology Recent Labs  Lab 10/30/18 0416 10/31/18 0704 11/01/18 0417  WBC 148.5* 154.1* 156.3*  RBC 3.16* 3.38* 3.48*  HGB 7.7* 8.8* 9.0*  HCT 26.7* 28.7* 29.3*  MCV 84.5 84.9 84.2  MCH 24.4* 26.0 25.9*  MCHC 28.8* 30.7 30.7  RDW 16.8* 17.2* 17.5*  PLT 371 457* 486*    Cardiac EnzymesNo results for input(s): TROPONINI in the last 168 hours. No results for input(s): TROPIPOC in the last 168 hours.   BNP Recent Labs  Lab 10/30/18 1043  BNP 1,676.4*     DDimer No results for input(s): DDIMER in the last 168 hours.   Radiology    Dg Chest Port 1 View  Result Date: 11/01/2018 CLINICAL DATA:  Shortness of breath. EXAM: PORTABLE CHEST 1 VIEW COMPARISON:  PA and lateral chest 10/30/2018. Single-view of the chest 10/31/2017. FINDINGS: Extensive bilateral perihilar airspace disease has worsened since yesterday's examination. Small  to moderate left pleural effusion and left basilar airspace disease have also increased. Trace right pleural effusion is seen. There is cardiomegaly. Aortic atherosclerosis noted. No pneumothorax. No focal bony abnormality. IMPRESSION: Worsened bilateral airspace disease has an appearance most consistent with increased pulmonary edema. Small to moderate left pleural effusion is also increased. Trace right pleural effusion noted. Electronically Signed   By: Inge Rise M.D.   On: 11/01/2018 08:41   Dg Chest Port 1 View  Result Date: 10/31/2018 CLINICAL DATA:  Acute onset of shortness of breath. EXAM: PORTABLE CHEST 1 VIEW COMPARISON:  Chest radiograph performed 10/10/2018 FINDINGS: The lungs are well-aerated. Vascular congestion is noted. Bilateral perihilar and left basilar airspace opacities raise concern for pulmonary edema. A small left pleural effusion is noted. No pneumothorax is seen. The cardiomediastinal silhouette is borderline enlarged. No acute osseous abnormalities are seen. A left-sided shoulder arthroplasty is grossly unremarkable in appearance, though incompletely imaged. Cervical spinal fusion hardware is partially imaged. IMPRESSION: Vascular congestion and borderline cardiomegaly. Bilateral perihilar and left basilar airspace opacities raise concern for pulmonary edema. Small left pleural effusion noted. Electronically Signed   By: Garald Balding M.D.   On: 10/31/2018 05:57    Telemetry    11/01/18 NSR with rates in the 80's - Personally Reviewed  ECG    No new tracings at the 11/01/2018- Personally Reviewed  Cardiac Studies   None  Patient Profile     77 y.o. female with a history of CLL, dementia, CVA (07/2016), HTN, HL, DMII, GERD, depression agitation, B12 deficiency, and OA, who is being seen today for the evaluation of new onset AFib RVR at the request of Dr. Candiss Norse.  Assessment & Plan    1.  Atrial fibrillation with RVR: -Presented with atrial fibrillation with  RVR with rates in the 180s with associated shortness of breath.  CXR with vascular conjestion treated with diltiazem bolus and drip as well as IV digoxin -Upon consultation with cardiology, plan was for IV amiodarone with load however patient converted to NSR approximately 10:12 AM on 10/31/18 -Plan was for rate control strategy for atrial fibrillation in the setting of markedly elevated rate on presentation as well as hypotension and anterior lateral ST changes -NSR with rates in the 80s to 90s today  -Continue PO amiodarone>> 400 mg twice daily -Continue Eliquis 5 mg twice daily>>no s/s of bleeding  -Continue Lopressor 25 mg twice daily -Echocardiogram with pending results  -CHA2DSVASc = 9  2.  URI: -On antibiotics per IM  3.  Acute diastolic CHF: -Likely in the setting of atrial fibrillation with rapid ventricular response -Was volume overloaded on presentation and received 80 mg IV Lasix -Would restart IV lasix 40mg  twice daily int the setting of improved BP and stable renal function  -CXR today with pulmonary vascular congestion  -Weight, 184lb today -I&O, net -1.2 L since admission  4.  Essential hypertension: -Soft improved, 113/100, 126/64, 106/57 -Continue metoprolol 25 mg twice daily  5.  Coronary calcium noted on recent CT abdomen: -No prior history of chest pain or dyspnea with anterior ST depression with tachycardia in the setting of atrial fibrillation -Given co-morbidities, plan is to not pursue ischemic evaluation   Signed, Kathyrn Drown NP-C [facility] Pager: [phone] 11/01/2018, 9:24 AM     For questions or updates, please contact   Please consult [email or web address] for contact info under Cardiology/STEMI.

## 2018-11-01 NOTE — Progress Notes (Signed)
Attempted echo on patient. Patient's oxygen in 70s because patient would not leave oxygen mask on because she is confused. RN and family informed would attempt echo later.

## 2018-11-02 ENCOUNTER — Encounter: Payer: Self-pay | Admitting: Hematology

## 2018-11-02 ENCOUNTER — Telehealth: Payer: Self-pay | Admitting: *Deleted

## 2018-11-02 ENCOUNTER — Other Ambulatory Visit (HOSPITAL_COMMUNITY): Payer: Medicare Other

## 2018-11-02 ENCOUNTER — Inpatient Hospital Stay (HOSPITAL_COMMUNITY): Payer: Medicare Other

## 2018-11-02 LAB — BASIC METABOLIC PANEL
Anion gap: 12 (ref 5–15)
BUN: 42 mg/dL — ABNORMAL HIGH (ref 8–23)
CO2: 24 mmol/L (ref 22–32)
Calcium: 8.1 mg/dL — ABNORMAL LOW (ref 8.9–10.3)
Chloride: 104 mmol/L (ref 98–111)
Creatinine, Ser: 1.23 mg/dL — ABNORMAL HIGH (ref 0.44–1.00)
GFR calc Af Amer: 49 mL/min — ABNORMAL LOW (ref >60)
GFR calc non Af Amer: 42 mL/min — ABNORMAL LOW (ref >60)
Glucose, Bld: 113 mg/dL — ABNORMAL HIGH (ref 70–99)
Potassium: 4.5 mmol/L (ref 3.5–5.1)
Sodium: 140 mmol/L (ref 135–145)

## 2018-11-02 LAB — CBC
HCT: 30.7 % — ABNORMAL LOW (ref 36.0–46.0)
Hemoglobin: 9 g/dL — ABNORMAL LOW (ref 12.0–15.0)
MCH: 24.8 pg — ABNORMAL LOW (ref 26.0–34.0)
MCHC: 29.3 g/dL — ABNORMAL LOW (ref 30.0–36.0)
MCV: 84.6 fL (ref 80.0–100.0)
NRBC: 0 % (ref 0.0–0.2)
Platelets: 455 10*3/uL — ABNORMAL HIGH (ref 150–400)
RBC: 3.63 MIL/uL — ABNORMAL LOW (ref 3.87–5.11)
RDW: 17.6 % — ABNORMAL HIGH (ref 11.5–15.5)
WBC: 123.7 10*3/uL (ref 4.0–10.5)

## 2018-11-02 LAB — MAGNESIUM: Magnesium: 2.1 mg/dL (ref 1.7–2.4)

## 2018-11-02 LAB — GLUCOSE, CAPILLARY: Glucose-Capillary: 107 mg/dL — ABNORMAL HIGH (ref 70–99)

## 2018-11-02 MED ORDER — MORPHINE 100MG IN NS 100ML (1MG/ML) PREMIX INFUSION
5.0000 mg/h | INTRAVENOUS | Status: DC
  Filled 2018-11-02: qty 100

## 2018-11-02 MED ORDER — METOPROLOL TARTRATE 5 MG/5ML IV SOLN
5.0000 mg | Freq: Once | INTRAVENOUS | Status: AC
  Administered 2018-11-02: 5 mg via INTRAVENOUS

## 2018-11-02 MED ORDER — DILTIAZEM HCL 25 MG/5ML IV SOLN
10.0000 mg | Freq: Once | INTRAVENOUS | Status: AC
  Administered 2018-11-02: 10 mg via INTRAVENOUS
  Filled 2018-11-02: qty 5

## 2018-11-02 MED ORDER — MORPHINE SULFATE (PF) 2 MG/ML IV SOLN
2.0000 mg | Freq: Once | INTRAVENOUS | Status: AC
  Administered 2018-11-02: 2 mg via INTRAVENOUS
  Filled 2018-11-02: qty 1

## 2018-11-02 MED ORDER — SODIUM CHLORIDE 0.9 % IV BOLUS: 500.0000 mL | Freq: Once | INTRAVENOUS | Status: DC

## 2018-11-02 MED ORDER — MORPHINE SULFATE (PF) 4 MG/ML IV SOLN: 5.0000 mg | Freq: Once | INTRAVENOUS | Status: DC

## 2018-11-02 MED ORDER — METOPROLOL TARTRATE 5 MG/5ML IV SOLN
INTRAVENOUS | Status: AC
  Filled 2018-11-02: qty 5

## 2018-11-03 LAB — CULTURE, BLOOD (ROUTINE X 2)
CULTURE: NO GROWTH
Culture: NO GROWTH
Special Requests: ADEQUATE

## 2018-11-03 LAB — TYPE AND SCREEN
ABO/RH(D): A POS
Antibody Screen: NEGATIVE
UNIT DIVISION: 0
Unit division: 0
Unit division: 0

## 2018-11-03 LAB — BPAM RBC
Blood Product Expiration Date: 202001162359
Blood Product Expiration Date: 202001162359
Blood Product Expiration Date: 202001162359
ISSUE DATE / TIME: 201912281627
Unit Type and Rh: 6200
Unit Type and Rh: 6200
Unit Type and Rh: 6200

## 2018-11-03 NOTE — Progress Notes (Signed)
PROGRESS NOTE                                                                                                                                                                                                             Patient Demographics:    Theresa Kramer, is a 78 y.o. female, DOB - [birth year], MWU:[id number]  Admit date - 10/09/2018   Admitting Physician Sid Falcon, MD  Outpatient Primary MD for the patient is Asencion Noble, MD  LOS - 4  Chief Complaint  Patient presents with  . Urinary Tract Infection       Brief Narrative  Theresa Kramer is a 78 y.o. female with medical history significant of CLL, Dementia, h/o stroke, HTN, HLD, DM2, GERD, Depression, agitation, B12 deficiency, arthritis who presents for AMS and cough.  Theresa Kramer has dementia and is cared for by her daughter [name].  On 12/24, [name] noted a dry cough in her mother which turned productive, she also noticed some subjective dysuria, mild confusion along with a temp of 100.3.  She was diagnosed with a URI/early pneumonia and admitted to the hospital.   Subjective:   Patient in bed, she is on BiPAP and still appears to be short of breath, overall confused.    Assessment  & Plan :     1.  Toxic encephalopathy caused by URI vs CHF.  Placed on empiric antibiotics and IV fluids, however despite appropriate treatment continue to be quite short of breath, further work-up suggested that her proBNP was quite elevated suggesting some underlying CHF as well, cardiology was consulted and she was placed on aggressive IV Lasix along with total of Zaroxolyn for diuresis.  Antibiotics were continued.  Patient became progressively more short of breath to the point that she required BiPAP, overall condition continued to worsen on top of her poor baseline.  Detailed discussion was made me and family.  It was decided that she would be transitioned to full comfort measures on 11-23-18 10:30 AM.  All unnecessary medications  will be stopped and she will be started on morphine drip.  Goal of care will be comfort.  She is DNR.  Likely to pass away soon.  .  2.  CLL.  Undergoing chemo under the care of Dr. Irene Limbo.  3.  Anemia of chronic disease.  Slightly worse due to him dilution, she is symptomatic in terms of shortness of breath and mild tachycardia will transfuse 1 unit and monitor.  No signs of acute  bleed. DW Onco on call Dr.Mohammad.  Posttransfusion H&H stable.  4.  Essential hypertension.  Low-dose Lopressor and monitor.  5.  ARF.  No on comfort measures.  6.  GERD.  On famotidine continue.  7.  Anxiety and depression.  Home medications continued unchanged.  8.  Dyslipidemia.  Hold statin now comfort meds.  9.  History of stroke.  On statin and Plavix for secondary prevention now transition to comfort meds and comfort measures.  10.  New onset A. fib RVR diagnosed on 10/31/2018.  Likely due to distress of fluid overload and CHF.  Seen by cardiology, initially on Cardizem drip now transition to Lopressor, continues to be in RVR on 11-26-2018 but now transition to comfort care.  11.  Acute hypoxic respiratory failure due to acute on chronic diastolic CHF last known EF 60% in 2017.  #1 above.     Family Communication  :  daughter  Code Status :  Full  Disposition Plan  : Patient to full comfort measures and IV morphine likely to pass away soon, if survives residential hospice  Consults  : Cardiology, Dr. Julien Nordmann hematology consulted on 10/30/2018 for the phone.  Procedures  :    Recent CTA 10/06/2018 - no PE  Echocardiogram.  DVT Prophylaxis  :  Lovenox will transition to Eliquis on 10/31/2018 due to paroxysmal A. fib.  Lab Results  Component Value Date   PLT 455 (H) 26-Nov-2018    Diet :  Diet Order            Diet NPO time specified Except for: Sips with Meds, Ice Chips  Diet effective now               Inpatient Medications Scheduled Meds: . amiodarone  400 mg Oral BID  .  apixaban  5 mg Oral BID  . famotidine  10 mg Oral QHS  . furosemide  80 mg Intravenous BID  . LORazepam  0.25 mg Oral Daily  . mouth rinse  15 mL Mouth Rinse BID  . metoprolol tartrate      . metoprolol tartrate  25 mg Oral BID  . oxybutynin  10 mg Oral QHS  . risperiDONE  0.5 mg Oral Q breakfast   Continuous Infusions: . morphine    . sodium chloride Stopped (11/26/2018 0311)   PRN Meds:.haloperidol lactate, levalbuterol, ondansetron  Antibiotics  :   Anti-infectives (From admission, onward)   Start     Dose/Rate Route Frequency Ordered Stop   11/01/18 1015  azithromycin (ZITHROMAX) tablet 500 mg  Status:  Discontinued     500 mg Oral Daily 11/01/18 1007 26-Nov-2018 1026   10/03/2018 0945  cefTRIAXone (ROCEPHIN) 2 g in sodium chloride 0.9 % 100 mL IVPB  Status:  Discontinued     2 g 200 mL/hr over 30 Minutes Intravenous Every 24 hours 10/05/2018 0941 11/01/18 1007   10/03/2018 0945  azithromycin (ZITHROMAX) 500 mg in sodium chloride 0.9 % 250 mL IVPB  Status:  Discontinued     500 mg 250 mL/hr over 60 Minutes Intravenous Every 24 hours 11/01/2018 0941 11/01/18 1007          Objective:   Vitals:   26-Nov-2018 0254 11-26-18 0523 11-26-18 0606 2018/11/26 0738  BP: (!) 88/58  (!) 82/54   Pulse: (!) 130  (!) 138 (!) 145  Resp: (!) 23 (!) 25 (!) 22 (!) 28  Temp:   99.2 F (37.3 C)   TempSrc:   Axillary   SpO2:  95% 96% 93% 91%  Weight:      Height:        Wt Readings from Last 3 Encounters:  10/09/2018 83.6 kg  10/12/18 83.5 kg  09/22/18 83.9 kg     Intake/Output Summary (Last 24 hours) at 04-Nov-2018 1033 Last data filed at 11/01/2018 2313 Gross per 24 hour  Intake -  Output 1850 ml  Net -1850 ml     Physical Exam  Awake but confused, appears SOB [place].AT,PERRAL Supple Neck,No JVD, No cervical lymphadenopathy appriciated.  Symmetrical Chest wall movement, Good air movement bilaterally, +ve rales RRR,No Gallops, Rubs or new Murmurs, No Parasternal Heave +ve B.Sounds, Abd Soft,  No tenderness, No organomegaly appriciated, No rebound - guarding or rigidity. No Cyanosis, Clubbing or edema, No new Rash or bruise    Data Review:    CBC Recent Labs  Lab 10/16/2018 0941 10/30/18 0416 10/31/18 0704 11/01/18 0417 Nov 04, 2018 0403  WBC 126.4* 148.5* 154.1* 156.3* 123.7*  HGB 8.6* 7.7* 8.8* 9.0* 9.0*  HCT 29.0* 26.7* 28.7* 29.3* 30.7*  PLT 388 371 457* 486* 455*  MCV 85.8 84.5 84.9 84.2 84.6  MCH 25.4* 24.4* 26.0 25.9* 24.8*  MCHC 29.7* 28.8* 30.7 30.7 29.3*  RDW 16.9* 16.8* 17.2* 17.5* 17.6*  LYMPHSABS 79.6*  --   --   --   --   MONOABS 22.8*  --   --   --   --   EOSABS 0.0  --   --   --   --   BASOSABS 1.3*  --   --   --   --     Chemistries  Recent Labs  Lab 10/21/2018 0941 10/30/18 0416 10/31/18 0704 11/01/18 0417 04-Nov-2018 0403  NA 134* 135 135 135 140  K 4.7 4.2 4.9 4.7 4.5  CL 98 100 100 101 104  CO2 21* 21* 19* 22 24  GLUCOSE 154* 110* 155* 135* 113*  BUN 31* 35* 41* 43* 42*  CREATININE 1.57* 1.33* 1.38* 1.40* 1.23*  CALCIUM 8.7* 8.3* 8.4* 8.3* 8.1*  MG  --   --   --  2.2 2.1  AST 22 18  --   --   --   ALT 20 13  --   --   --   ALKPHOS 234* 188*  --   --   --   BILITOT 1.1 0.3  --   --   --    ------------------------------------------------------------------------------------------------------------------ No results for input(s): CHOL, HDL, LDLCALC, TRIG, CHOLHDL, LDLDIRECT in the last 72 hours.  Lab Results  Component Value Date   HGBA1C 8.3 (H) 07/30/2016   ------------------------------------------------------------------------------------------------------------------ Recent Labs    10/30/18 1043  TSH 2.788   ------------------------------------------------------------------------------------------------------------------ No results for input(s): VITAMINB12, FOLATE, FERRITIN, TIBC, IRON, RETICCTPCT in the last 72 hours.  Coagulation profile No results for input(s): INR, PROTIME in the last 168 hours.  No results for input(s):  DDIMER in the last 72 hours.  Cardiac Enzymes No results for input(s): CKMB, TROPONINI, MYOGLOBIN in the last 168 hours.  Invalid input(s): CK ------------------------------------------------------------------------------------------------------------------    Component Value Date/Time   BNP 1,676.4 (H) 10/30/2018 1043    Micro Results Recent Results (from the past 240 hour(s))  Blood Culture (routine x 2)     Status: None (Preliminary result)   Collection Time: 10/06/2018 10:56 AM  Result Value Ref Range Status   Specimen Description BLOOD BLOOD LEFT HAND  Final   Special Requests   Final    BOTTLES DRAWN AEROBIC AND ANAEROBIC  Blood Culture results may not be optimal due to an inadequate volume of blood received in culture bottles   Culture   Final    NO GROWTH 4 DAYS Performed at [facility] [address], [place], [place] [address]    Report Status PENDING  Incomplete  Blood Culture (routine x 2)     Status: None (Preliminary result)   Collection Time: 10/31/2018 10:56 AM  Result Value Ref Range Status   Specimen Description BLOOD BLOOD LEFT FOREARM  Final   Special Requests   Final    BOTTLES DRAWN AEROBIC AND ANAEROBIC Blood Culture adequate volume   Culture   Final    NO GROWTH 4 DAYS Performed at [facility], [place] [address], [place], [place] [address]    Report Status PENDING  Incomplete  Culture, Urine     Status: Abnormal   Collection Time: 10/09/2018 12:24 PM  Result Value Ref Range Status   Specimen Description URINE, RANDOM  Final   Special Requests   Final    NONE Performed at [facility], [place] [address], [place], [place] [address]    Culture <10,000 COLONIES/mL INSIGNIFICANT GROWTH (A)  Final   Report Status 10/30/2018 FINAL  Final    Radiology Reports Dg Chest 2 View  Result Date: 10/10/2018 CLINICAL DATA:  Cough and shortness of breath EXAM: CHEST - 2 VIEW COMPARISON:  December 21, 2017 FINDINGS: There is atelectatic change in the  lateral left base. The lungs elsewhere are clear. Heart size and pulmonary vascularity are normal. No adenopathy. Postoperative changes noted in the lower cervical spine. Total shoulder replacement noted on the left. There is aortic atherosclerosis. IMPRESSION: Atelectasis lateral left base. This area may represent earliest changes of pneumonia. Lungs elsewhere clear. No adenopathy appreciable. There is aortic atherosclerosis. Aortic Atherosclerosis (ICD10-I70.0). Electronically Signed   By: Lowella Grip III M.D.   On: 11/01/2018 10:14   Ct Chest W Contrast  Result Date: 10/06/2018 CLINICAL DATA:  CLL/SLL.  Worsening anemia.  Restaging. EXAM: CT CHEST, ABDOMEN, AND PELVIS WITH CONTRAST TECHNIQUE: Multidetector CT imaging of the chest, abdomen and pelvis was performed following the standard protocol during bolus administration of intravenous contrast. CONTRAST:  190mL OMNIPAQUE IOHEXOL 300 MG/ML  SOLN COMPARISON:  08/26/2016 PET-CT. FINDINGS: CT CHEST FINDINGS Cardiovascular: Normal heart size. No significant pericardial effusion/thickening. Three-vessel coronary atherosclerosis. Atherosclerotic nonaneurysmal thoracic aorta. Normal caliber pulmonary arteries. No central pulmonary emboli. Mediastinum/Nodes: No discrete thyroid nodules. Unremarkable esophagus. No pathologically enlarged axillary, mediastinal or hilar lymph nodes. Lungs/Pleura: No pneumothorax. No pleural effusion. Clustered centrilobular nodularity with faint calcifications in the anterior left lower lobe, unchanged since 08/26/2016 PET-CT, compatible with benign postinflammatory nodularity. No acute consolidative airspace disease, lung masses or new significant pulmonary nodules. Musculoskeletal: No aggressive appearing focal osseous lesions. Partially visualized surgical hardware from ACDF in the lower cervical spine. Left total shoulder arthroplasty. Mild thoracic spondylosis. CT ABDOMEN PELVIS FINDINGS Hepatobiliary: Scattered  granulomatous calcifications throughout the liver, unchanged. No liver mass. Normal gallbladder with no radiopaque cholelithiasis. No biliary ductal dilatation. Pancreas: Normal, with no mass or duct dilation. Spleen: Marked splenomegaly (craniocaudal splenic length 22.8 cm, increased from 15.7 cm on 08/26/2016 PET-CT). No splenic mass. Scattered granulomatous splenic calcifications, unchanged. Adrenals/Urinary Tract: Normal adrenals. Normal kidneys with no hydronephrosis and no renal mass. Bladder obscured by streak artifact from the right hip hardware. Bladder appears nondistended and normal. Stomach/Bowel: Small hiatal hernia. Otherwise normal nondistended stomach. Normal caliber small bowel with no small  bowel wall thickening. Appendectomy. Moderate sigmoid diverticulosis, no large bowel wall thickening or acute pericolonic fat stranding. Vascular/Lymphatic: Atherosclerotic nonaneurysmal abdominal aorta. Patent portal, splenic, hepatic and renal veins. No pathologically enlarged lymph nodes in the abdomen or pelvis. Reproductive: Status post hysterectomy, with no abnormal findings at the vaginal cuff. No adnexal mass. Other: No pneumoperitoneum, ascites or focal fluid collection. Musculoskeletal: No aggressive appearing focal osseous lesions. Right total hip arthroplasty. Marked lumbar spondylosis. IMPRESSION: 1. Marked splenomegaly, significantly increased in size since 2017 PET-CT. 2. No lymphadenopathy or other sites of lymphoproliferative disease. 3. Three-vessel coronary atherosclerosis. 4.  Aortic Atherosclerosis (ICD10-I70.0). Electronically Signed   By: Ilona Sorrel M.D.   On: 10/06/2018 15:13   Ct Abdomen Pelvis W Contrast  Result Date: 10/06/2018 CLINICAL DATA:  CLL/SLL.  Worsening anemia.  Restaging. EXAM: CT CHEST, ABDOMEN, AND PELVIS WITH CONTRAST TECHNIQUE: Multidetector CT imaging of the chest, abdomen and pelvis was performed following the standard protocol during bolus administration of  intravenous contrast. CONTRAST:  152mL OMNIPAQUE IOHEXOL 300 MG/ML  SOLN COMPARISON:  08/26/2016 PET-CT. FINDINGS: CT CHEST FINDINGS Cardiovascular: Normal heart size. No significant pericardial effusion/thickening. Three-vessel coronary atherosclerosis. Atherosclerotic nonaneurysmal thoracic aorta. Normal caliber pulmonary arteries. No central pulmonary emboli. Mediastinum/Nodes: No discrete thyroid nodules. Unremarkable esophagus. No pathologically enlarged axillary, mediastinal or hilar lymph nodes. Lungs/Pleura: No pneumothorax. No pleural effusion. Clustered centrilobular nodularity with faint calcifications in the anterior left lower lobe, unchanged since 08/26/2016 PET-CT, compatible with benign postinflammatory nodularity. No acute consolidative airspace disease, lung masses or new significant pulmonary nodules. Musculoskeletal: No aggressive appearing focal osseous lesions. Partially visualized surgical hardware from ACDF in the lower cervical spine. Left total shoulder arthroplasty. Mild thoracic spondylosis. CT ABDOMEN PELVIS FINDINGS Hepatobiliary: Scattered granulomatous calcifications throughout the liver, unchanged. No liver mass. Normal gallbladder with no radiopaque cholelithiasis. No biliary ductal dilatation. Pancreas: Normal, with no mass or duct dilation. Spleen: Marked splenomegaly (craniocaudal splenic length 22.8 cm, increased from 15.7 cm on 08/26/2016 PET-CT). No splenic mass. Scattered granulomatous splenic calcifications, unchanged. Adrenals/Urinary Tract: Normal adrenals. Normal kidneys with no hydronephrosis and no renal mass. Bladder obscured by streak artifact from the right hip hardware. Bladder appears nondistended and normal. Stomach/Bowel: Small hiatal hernia. Otherwise normal nondistended stomach. Normal caliber small bowel with no small bowel wall thickening. Appendectomy. Moderate sigmoid diverticulosis, no large bowel wall thickening or acute pericolonic fat stranding.  Vascular/Lymphatic: Atherosclerotic nonaneurysmal abdominal aorta. Patent portal, splenic, hepatic and renal veins. No pathologically enlarged lymph nodes in the abdomen or pelvis. Reproductive: Status post hysterectomy, with no abnormal findings at the vaginal cuff. No adnexal mass. Other: No pneumoperitoneum, ascites or focal fluid collection. Musculoskeletal: No aggressive appearing focal osseous lesions. Right total hip arthroplasty. Marked lumbar spondylosis. IMPRESSION: 1. Marked splenomegaly, significantly increased in size since 2017 PET-CT. 2. No lymphadenopathy or other sites of lymphoproliferative disease. 3. Three-vessel coronary atherosclerosis. 4.  Aortic Atherosclerosis (ICD10-I70.0). Electronically Signed   By: Ilona Sorrel M.D.   On: 10/06/2018 15:13   Dg Chest Port 1 View  Result Date: 11/20/18 CLINICAL DATA:  Cough and fever EXAM: PORTABLE CHEST 1 VIEW COMPARISON:  November 01, 2018 FINDINGS: There is extensive airspace consolidation bilaterally, primarily in a perihilar distribution. There is cardiomegaly with pulmonary venous hypertension. There is a small left pleural effusion. There is aortic atherosclerosis. No adenopathy appreciable. There is postoperative change in the lower cervical region. Patient is status post total shoulder replacement. IMPRESSION: Pulmonary vascular congestion with perihilar airspace opacity bilaterally. Suspect alveolar edema with congestive heart  failure, although pneumonia may present similarly. Both entities may be present concurrently. Small left pleural effusion. There is aortic atherosclerosis. Note that study is essentially stable compared to 1 day prior. Aortic Atherosclerosis (ICD10-I70.0). Electronically Signed   By: Lowella Grip III M.D.   On: 2018/12/01 08:03   Dg Chest Port 1 View  Result Date: 11/01/2018 CLINICAL DATA:  Shortness of breath. EXAM: PORTABLE CHEST 1 VIEW COMPARISON:  PA and lateral chest 10/27/2018. Single-view of the  chest 10/31/2017. FINDINGS: Extensive bilateral perihilar airspace disease has worsened since yesterday's examination. Small to moderate left pleural effusion and left basilar airspace disease have also increased. Trace right pleural effusion is seen. There is cardiomegaly. Aortic atherosclerosis noted. No pneumothorax. No focal bony abnormality. IMPRESSION: Worsened bilateral airspace disease has an appearance most consistent with increased pulmonary edema. Small to moderate left pleural effusion is also increased. Trace right pleural effusion noted. Electronically Signed   By: Inge Rise M.D.   On: 11/01/2018 08:41   Dg Chest Port 1 View  Result Date: 10/31/2018 CLINICAL DATA:  Acute onset of shortness of breath. EXAM: PORTABLE CHEST 1 VIEW COMPARISON:  Chest radiograph performed 10/28/2018 FINDINGS: The lungs are well-aerated. Vascular congestion is noted. Bilateral perihilar and left basilar airspace opacities raise concern for pulmonary edema. A small left pleural effusion is noted. No pneumothorax is seen. The cardiomediastinal silhouette is borderline enlarged. No acute osseous abnormalities are seen. A left-sided shoulder arthroplasty is grossly unremarkable in appearance, though incompletely imaged. Cervical spinal fusion hardware is partially imaged. IMPRESSION: Vascular congestion and borderline cardiomegaly. Bilateral perihilar and left basilar airspace opacities raise concern for pulmonary edema. Small left pleural effusion noted. Electronically Signed   By: Garald Balding M.D.   On: 10/31/2018 05:57    Time Spent in minutes  30   Lala Lund M.D on 12/01/2018 at 10:33 AM  To page go to [email or web address] - password [facility]

## 2018-11-03 NOTE — Progress Notes (Signed)
Informed Dr. Candiss Norse that patients POA Ivin Booty is wanting to make patient comfort care. Informed Dr. Candiss Norse. Per MD give 5mg  IV morphine once and he will place the orders for morphine drip.

## 2018-11-03 NOTE — Progress Notes (Signed)
Patient drowsy and unable to swallow night time PO medications. Night time meds not given. Pt   maintaining sats > 90% on Bipap  70% FIO2. To continue to monitor for acute changes

## 2018-11-03 NOTE — Progress Notes (Signed)
Patient has been made comfort care.  Will sign off.

## 2018-11-03 NOTE — Death Summary Note (Signed)
[facility] Death Note                                                                                                                                                                                               Theresa Kramer, is a 78 y.o. female, DOB - 02/03/1941, IRW:[id number]  Admit date - 10/04/2018   Admitting Physician Sid Falcon, MD  Outpatient Primary MD for the patient is Asencion Noble, MD  LOS - 4  Chief Complaint  Patient presents with  . Urinary Tract Infection       Notification: Asencion Noble, MD notified of death of 2018/11/10   Date and Time of Death - 2018/11/10 @ 11am  Pronounced by - RN  History of present illness:   Theresa BOGGIO is a 78 y.o. female with a history of -    Toxic encephalopathy caused by URI vs CHF.  Placed on empiric antibiotics and IV fluids, however despite appropriate treatment continue to be quite short of breath, further work-up suggested that her proBNP was quite elevated suggesting some underlying CHF as well, cardiology was consulted and she was placed on aggressive IV Lasix along with total of Zaroxolyn for diuresis.  Antibiotics were continued.  Patient became progressively more short of breath to the point that she required BiPAP, overall condition continued to worsen on top of her poor baseline.  Detailed discussion was made me and family.  It was decided that she would be transitioned to full comfort measures on November 10, 2018 10:30 AM.  All unnecessary medications will be stopped and she will be started on morphine drip.  Goal of care will be comfort.  She was DNR.  She passed away soon at 11am.   Final Diagnoses:  Cause if death - CHF  Signature  Lala Lund M.D on 2018/11/10 at 12:53 PM  [facility]  Office Phone -[phone]  Total clinical and documentation time for today Under 30 minutes   Last Note               PROGRESS  NOTE  Patient Demographics:    Theresa Kramer, is a 78 y.o. female, DOB - 07/24/1941, MHD:[id number]  Admit date - 10/16/2018   Admitting Physician Sid Falcon, MD  Outpatient Primary MD for the patient is Asencion Noble, MD  LOS - 4  Chief Complaint  Patient presents with  . Urinary Tract Infection       Brief Narrative  [name] is a 79 y.o. female with medical history significant of CLL, Dementia, h/o stroke, HTN, HLD, DM2, GERD, Depression, agitation, B12 deficiency, arthritis who presents for AMS and cough.  Ms. Kallen has dementia and is cared for by her daughter [name].  On 12/24, [name] noted a dry cough in her mother which turned productive, she also noticed some subjective dysuria, mild confusion along with a temp of 100.3.  She was diagnosed with a URI/early pneumonia and admitted to the hospital.   Subjective:   Patient in bed, she is on BiPAP and still appears to be short of breath, overall confused.    Assessment  & Plan :     1.  Toxic encephalopathy caused by URI vs CHF.  Placed on empiric antibiotics and IV fluids, however despite appropriate treatment continue to be quite short of breath, further work-up suggested that her proBNP was quite elevated suggesting some underlying CHF as well, cardiology was consulted and she was placed on aggressive IV Lasix along with total of Zaroxolyn for diuresis.  Antibiotics were continued.  Patient became progressively more short of breath to the point that she required BiPAP, overall condition continued to worsen on top of her poor baseline.  Detailed discussion was made me and family.  It was decided that she would be transitioned to full comfort measures on December 01, 2018 10:30 AM.  All unnecessary medications will be stopped and she  will be started on morphine drip.  Goal of care will be comfort.  She is DNR.  Likely to pass away soon.  .  2.  CLL.  Undergoing chemo under the care of Dr. Irene Limbo.  3.  Anemia of chronic disease.  Slightly worse due to him dilution, she is symptomatic in terms of shortness of breath and mild tachycardia will transfuse 1 unit and monitor.  No signs of acute bleed. DW Onco on call Dr.Mohammad.  Posttransfusion H&H stable.  4.  Essential hypertension.  Low-dose Lopressor and monitor.  5.  ARF.  No on comfort measures.  6.  GERD.  On famotidine continue.  7.  Anxiety and depression.  Home medications continued unchanged.  8.  Dyslipidemia.  Hold statin now comfort meds.  9.  History of stroke.  On statin and Plavix for secondary prevention now transition to comfort meds and comfort measures.  10.  New onset A. fib RVR diagnosed on 10/31/2018.  Likely due to distress of fluid overload and CHF.  Seen by cardiology, initially on Cardizem drip now transition to Lopressor, continues to be in RVR on Dec 01, 2018 but now transition to comfort care.  11.  Acute hypoxic respiratory failure due to acute on chronic diastolic CHF last known EF 60% in 2017.  #1 above.     Family Communication  :  daughter  Code Status :  Full  Disposition Plan  : Patient to full comfort measures and IV morphine likely to pass away soon, if survives residential hospice  Consults  : Cardiology, Dr. Julien Nordmann hematology consulted on 10/30/2018 for the phone.  Procedures  :    Recent CTA 10/06/2018 - no PE  Echocardiogram.  DVT Prophylaxis  :  Lovenox will transition to Eliquis on 10/31/2018 due to paroxysmal A. fib.  Lab Results  Component Value Date   PLT 455 (H) 2018/11/19    Diet :  Diet Order            Diet NPO time specified Except for: Sips with Meds, Ice Chips  Diet effective now               Inpatient Medications Scheduled Meds: . amiodarone  400 mg Oral BID  . apixaban  5 mg Oral BID    . famotidine  10 mg Oral QHS  . furosemide  80 mg Intravenous BID  . LORazepam  0.25 mg Oral Daily  . mouth rinse  15 mL Mouth Rinse BID  . metoprolol tartrate      . metoprolol tartrate  25 mg Oral BID  . oxybutynin  10 mg Oral QHS  . risperiDONE  0.5 mg Oral Q breakfast   Continuous Infusions: . morphine    . sodium chloride Stopped (19-Nov-2018 0311)   PRN Meds:.haloperidol lactate, levalbuterol, ondansetron  Antibiotics  :   Anti-infectives (From admission, onward)   Start     Dose/Rate Route Frequency Ordered Stop   11/01/18 1015  azithromycin (ZITHROMAX) tablet 500 mg  Status:  Discontinued     500 mg Oral Daily 11/01/18 1007 11-19-2018 1026   10/12/2018 0945  cefTRIAXone (ROCEPHIN) 2 g in sodium chloride 0.9 % 100 mL IVPB  Status:  Discontinued     2 g 200 mL/hr over 30 Minutes Intravenous Every 24 hours 10/25/2018 0941 11/01/18 1007   10/30/2018 0945  azithromycin (ZITHROMAX) 500 mg in sodium chloride 0.9 % 250 mL IVPB  Status:  Discontinued     500 mg 250 mL/hr over 60 Minutes Intravenous Every 24 hours 10/04/2018 0941 11/01/18 1007          Objective:   Vitals:   11-19-2018 0829 11-19-2018 0831 11-19-18 0832 11/19/18 1055  BP: 103/88  (!) 91/55   Pulse: (!) 134 (!) 134 (!) 143   Resp: (!) 23 (!) 27 (!) 27   Temp:      TempSrc:      SpO2: (!) 86% (!) 87% (!) 87%   Weight:    83.6 kg  Height:    5\' 4"  (1.626 m)    Wt Readings from Last 3 Encounters:  2018/11/19 83.6 kg  10/12/18 83.5 kg  09/22/18 83.9 kg     Intake/Output Summary (Last 24 hours) at 11-19-18 1253 Last data filed at 11/01/2018 2313 Gross per 24 hour  Intake -  Output 1850 ml  Net -1850 ml     Physical Exam  Awake but confused, appears SOB [place].AT,PERRAL Supple Neck,No JVD, No cervical lymphadenopathy appriciated.  Symmetrical Chest wall movement, Good air movement bilaterally, +ve rales RRR,No Gallops, Rubs or new Murmurs, No Parasternal Heave +ve B.Sounds, Abd Soft, No tenderness, No  organomegaly appriciated, No rebound - guarding or rigidity. No Cyanosis, Clubbing or edema, No new Rash or bruise    Data Review:    CBC Recent Labs  Lab 10/16/2018 0941 10/30/18 0416 10/31/18 0704 11/01/18 0417 2018/11/19 0403  WBC 126.4* 148.5* 154.1* 156.3* 123.7*  HGB 8.6* 7.7* 8.8* 9.0* 9.0*  HCT 29.0* 26.7* 28.7* 29.3* 30.7*  PLT 388 371 457* 486* 455*  MCV 85.8 84.5 84.9 84.2 84.6  MCH 25.4* 24.4* 26.0 25.9* 24.8*  MCHC 29.7* 28.8* 30.7 30.7 29.3*  RDW 16.9* 16.8*  17.2* 17.5* 17.6*  LYMPHSABS 79.6*  --   --   --   --   MONOABS 22.8*  --   --   --   --   EOSABS 0.0  --   --   --   --   BASOSABS 1.3*  --   --   --   --     Chemistries  Recent Labs  Lab 11/01/2018 0941 10/30/18 0416 10/31/18 0704 11/01/18 0417 19-Nov-2018 0403  NA 134* 135 135 135 140  K 4.7 4.2 4.9 4.7 4.5  CL 98 100 100 101 104  CO2 21* 21* 19* 22 24  GLUCOSE 154* 110* 155* 135* 113*  BUN 31* 35* 41* 43* 42*  CREATININE 1.57* 1.33* 1.38* 1.40* 1.23*  CALCIUM 8.7* 8.3* 8.4* 8.3* 8.1*  MG  --   --   --  2.2 2.1  AST 22 18  --   --   --   ALT 20 13  --   --   --   ALKPHOS 234* 188*  --   --   --   BILITOT 1.1 0.3  --   --   --    ------------------------------------------------------------------------------------------------------------------ No results for input(s): CHOL, HDL, LDLCALC, TRIG, CHOLHDL, LDLDIRECT in the last 72 hours.  Lab Results  Component Value Date   HGBA1C 8.3 (H) 07/30/2016   ------------------------------------------------------------------------------------------------------------------ No results for input(s): TSH, T4TOTAL, T3FREE, THYROIDAB in the last 72 hours.  Invalid input(s): FREET3 ------------------------------------------------------------------------------------------------------------------ No results for input(s): VITAMINB12, FOLATE, FERRITIN, TIBC, IRON, RETICCTPCT in the last 72 hours.  Coagulation profile No results for input(s): INR, PROTIME in the  last 168 hours.  No results for input(s): DDIMER in the last 72 hours.  Cardiac Enzymes No results for input(s): CKMB, TROPONINI, MYOGLOBIN in the last 168 hours.  Invalid input(s): CK ------------------------------------------------------------------------------------------------------------------    Component Value Date/Time   BNP 1,676.4 (H) 10/30/2018 1043    Micro Results Recent Results (from the past 240 hour(s))  Blood Culture (routine x 2)     Status: None (Preliminary result)   Collection Time: 10/07/2018 10:56 AM  Result Value Ref Range Status   Specimen Description BLOOD BLOOD LEFT HAND  Final   Special Requests   Final    BOTTLES DRAWN AEROBIC AND ANAEROBIC Blood Culture results may not be optimal due to an inadequate volume of blood received in culture bottles   Culture   Final    NO GROWTH 4 DAYS Performed at [facility], [employer] [address] [place], [place] [address]    Report Status PENDING  Incomplete  Blood Culture (routine x 2)     Status: None (Preliminary result)   Collection Time: 11/01/2018 10:56 AM  Result Value Ref Range Status   Specimen Description BLOOD BLOOD LEFT FOREARM  Final   Special Requests   Final    BOTTLES DRAWN AEROBIC AND ANAEROBIC Blood Culture adequate volume   Culture   Final    NO GROWTH 4 DAYS Performed at [facility], [place] [address] [place], [place] [address]    Report Status PENDING  Incomplete  Culture, Urine     Status: Abnormal   Collection Time: 10/10/2018 12:24 PM  Result Value Ref Range Status   Specimen Description URINE, RANDOM  Final   Special Requests   Final    NONE Performed at [facility], [place] [address], [place], [place] [address]    Culture <10,000 COLONIES/mL INSIGNIFICANT GROWTH (A)  Final  Report Status 10/30/2018 FINAL  Final    Radiology Reports Dg Chest 2 View  Result Date: 10/08/2018 CLINICAL DATA:  Cough and shortness of breath EXAM: CHEST - 2 VIEW COMPARISON:  December 21, 2017  FINDINGS: There is atelectatic change in the lateral left base. The lungs elsewhere are clear. Heart size and pulmonary vascularity are normal. No adenopathy. Postoperative changes noted in the lower cervical spine. Total shoulder replacement noted on the left. There is aortic atherosclerosis. IMPRESSION: Atelectasis lateral left base. This area may represent earliest changes of pneumonia. Lungs elsewhere clear. No adenopathy appreciable. There is aortic atherosclerosis. Aortic Atherosclerosis (ICD10-I70.0). Electronically Signed   By: Lowella Grip III M.D.   On: 10/22/2018 10:14   Ct Chest W Contrast  Result Date: 10/06/2018 CLINICAL DATA:  CLL/SLL.  Worsening anemia.  Restaging. EXAM: CT CHEST, ABDOMEN, AND PELVIS WITH CONTRAST TECHNIQUE: Multidetector CT imaging of the chest, abdomen and pelvis was performed following the standard protocol during bolus administration of intravenous contrast. CONTRAST:  158mL OMNIPAQUE IOHEXOL 300 MG/ML  SOLN COMPARISON:  08/26/2016 PET-CT. FINDINGS: CT CHEST FINDINGS Cardiovascular: Normal heart size. No significant pericardial effusion/thickening. Three-vessel coronary atherosclerosis. Atherosclerotic nonaneurysmal thoracic aorta. Normal caliber pulmonary arteries. No central pulmonary emboli. Mediastinum/Nodes: No discrete thyroid nodules. Unremarkable esophagus. No pathologically enlarged axillary, mediastinal or hilar lymph nodes. Lungs/Pleura: No pneumothorax. No pleural effusion. Clustered centrilobular nodularity with faint calcifications in the anterior left lower lobe, unchanged since 08/26/2016 PET-CT, compatible with benign postinflammatory nodularity. No acute consolidative airspace disease, lung masses or new significant pulmonary nodules. Musculoskeletal: No aggressive appearing focal osseous lesions. Partially visualized surgical hardware from ACDF in the lower cervical spine. Left total shoulder arthroplasty. Mild thoracic spondylosis. CT ABDOMEN PELVIS  FINDINGS Hepatobiliary: Scattered granulomatous calcifications throughout the liver, unchanged. No liver mass. Normal gallbladder with no radiopaque cholelithiasis. No biliary ductal dilatation. Pancreas: Normal, with no mass or duct dilation. Spleen: Marked splenomegaly (craniocaudal splenic length 22.8 cm, increased from 15.7 cm on 08/26/2016 PET-CT). No splenic mass. Scattered granulomatous splenic calcifications, unchanged. Adrenals/Urinary Tract: Normal adrenals. Normal kidneys with no hydronephrosis and no renal mass. Bladder obscured by streak artifact from the right hip hardware. Bladder appears nondistended and normal. Stomach/Bowel: Small hiatal hernia. Otherwise normal nondistended stomach. Normal caliber small bowel with no small bowel wall thickening. Appendectomy. Moderate sigmoid diverticulosis, no large bowel wall thickening or acute pericolonic fat stranding. Vascular/Lymphatic: Atherosclerotic nonaneurysmal abdominal aorta. Patent portal, splenic, hepatic and renal veins. No pathologically enlarged lymph nodes in the abdomen or pelvis. Reproductive: Status post hysterectomy, with no abnormal findings at the vaginal cuff. No adnexal mass. Other: No pneumoperitoneum, ascites or focal fluid collection. Musculoskeletal: No aggressive appearing focal osseous lesions. Right total hip arthroplasty. Marked lumbar spondylosis. IMPRESSION: 1. Marked splenomegaly, significantly increased in size since 2017 PET-CT. 2. No lymphadenopathy or other sites of lymphoproliferative disease. 3. Three-vessel coronary atherosclerosis. 4.  Aortic Atherosclerosis (ICD10-I70.0). Electronically Signed   By: Ilona Sorrel M.D.   On: 10/06/2018 15:13   Ct Abdomen Pelvis W Contrast  Result Date: 10/06/2018 CLINICAL DATA:  CLL/SLL.  Worsening anemia.  Restaging. EXAM: CT CHEST, ABDOMEN, AND PELVIS WITH CONTRAST TECHNIQUE: Multidetector CT imaging of the chest, abdomen and pelvis was performed following the standard protocol  during bolus administration of intravenous contrast. CONTRAST:  130mL OMNIPAQUE IOHEXOL 300 MG/ML  SOLN COMPARISON:  08/26/2016 PET-CT. FINDINGS: CT CHEST FINDINGS Cardiovascular: Normal heart size. No significant pericardial effusion/thickening. Three-vessel coronary atherosclerosis. Atherosclerotic nonaneurysmal thoracic aorta. Normal caliber pulmonary arteries. No central pulmonary  emboli. Mediastinum/Nodes: No discrete thyroid nodules. Unremarkable esophagus. No pathologically enlarged axillary, mediastinal or hilar lymph nodes. Lungs/Pleura: No pneumothorax. No pleural effusion. Clustered centrilobular nodularity with faint calcifications in the anterior left lower lobe, unchanged since 08/26/2016 PET-CT, compatible with benign postinflammatory nodularity. No acute consolidative airspace disease, lung masses or new significant pulmonary nodules. Musculoskeletal: No aggressive appearing focal osseous lesions. Partially visualized surgical hardware from ACDF in the lower cervical spine. Left total shoulder arthroplasty. Mild thoracic spondylosis. CT ABDOMEN PELVIS FINDINGS Hepatobiliary: Scattered granulomatous calcifications throughout the liver, unchanged. No liver mass. Normal gallbladder with no radiopaque cholelithiasis. No biliary ductal dilatation. Pancreas: Normal, with no mass or duct dilation. Spleen: Marked splenomegaly (craniocaudal splenic length 22.8 cm, increased from 15.7 cm on 08/26/2016 PET-CT). No splenic mass. Scattered granulomatous splenic calcifications, unchanged. Adrenals/Urinary Tract: Normal adrenals. Normal kidneys with no hydronephrosis and no renal mass. Bladder obscured by streak artifact from the right hip hardware. Bladder appears nondistended and normal. Stomach/Bowel: Small hiatal hernia. Otherwise normal nondistended stomach. Normal caliber small bowel with no small bowel wall thickening. Appendectomy. Moderate sigmoid diverticulosis, no large bowel wall thickening or acute  pericolonic fat stranding. Vascular/Lymphatic: Atherosclerotic nonaneurysmal abdominal aorta. Patent portal, splenic, hepatic and renal veins. No pathologically enlarged lymph nodes in the abdomen or pelvis. Reproductive: Status post hysterectomy, with no abnormal findings at the vaginal cuff. No adnexal mass. Other: No pneumoperitoneum, ascites or focal fluid collection. Musculoskeletal: No aggressive appearing focal osseous lesions. Right total hip arthroplasty. Marked lumbar spondylosis. IMPRESSION: 1. Marked splenomegaly, significantly increased in size since 2017 PET-CT. 2. No lymphadenopathy or other sites of lymphoproliferative disease. 3. Three-vessel coronary atherosclerosis. 4.  Aortic Atherosclerosis (ICD10-I70.0). Electronically Signed   By: Ilona Sorrel M.D.   On: 10/06/2018 15:13   Dg Chest Port 1 View  Result Date: 12/02/2018 CLINICAL DATA:  Cough and fever EXAM: PORTABLE CHEST 1 VIEW COMPARISON:  November 01, 2018 FINDINGS: There is extensive airspace consolidation bilaterally, primarily in a perihilar distribution. There is cardiomegaly with pulmonary venous hypertension. There is a small left pleural effusion. There is aortic atherosclerosis. No adenopathy appreciable. There is postoperative change in the lower cervical region. Patient is status post total shoulder replacement. IMPRESSION: Pulmonary vascular congestion with perihilar airspace opacity bilaterally. Suspect alveolar edema with congestive heart failure, although pneumonia may present similarly. Both entities may be present concurrently. Small left pleural effusion. There is aortic atherosclerosis. Note that study is essentially stable compared to 1 day prior. Aortic Atherosclerosis (ICD10-I70.0). Electronically Signed   By: Lowella Grip III M.D.   On: Dec 02, 2018 08:03   Dg Chest Port 1 View  Result Date: 11/01/2018 CLINICAL DATA:  Shortness of breath. EXAM: PORTABLE CHEST 1 VIEW COMPARISON:  PA and lateral chest  10/21/2018. Single-view of the chest 10/31/2017. FINDINGS: Extensive bilateral perihilar airspace disease has worsened since yesterday's examination. Small to moderate left pleural effusion and left basilar airspace disease have also increased. Trace right pleural effusion is seen. There is cardiomegaly. Aortic atherosclerosis noted. No pneumothorax. No focal bony abnormality. IMPRESSION: Worsened bilateral airspace disease has an appearance most consistent with increased pulmonary edema. Small to moderate left pleural effusion is also increased. Trace right pleural effusion noted. Electronically Signed   By: Inge Rise M.D.   On: 11/01/2018 08:41   Dg Chest Port 1 View  Result Date: 10/31/2018 CLINICAL DATA:  Acute onset of shortness of breath. EXAM: PORTABLE CHEST 1 VIEW COMPARISON:  Chest radiograph performed 10/05/2018 FINDINGS: The lungs are well-aerated. Vascular congestion is noted.  Bilateral perihilar and left basilar airspace opacities raise concern for pulmonary edema. A small left pleural effusion is noted. No pneumothorax is seen. The cardiomediastinal silhouette is borderline enlarged. No acute osseous abnormalities are seen. A left-sided shoulder arthroplasty is grossly unremarkable in appearance, though incompletely imaged. Cervical spinal fusion hardware is partially imaged. IMPRESSION: Vascular congestion and borderline cardiomegaly. Bilateral perihilar and left basilar airspace opacities raise concern for pulmonary edema. Small left pleural effusion noted. Electronically Signed   By: Garald Balding M.D.   On: 10/31/2018 05:57    Time Spent in minutes  30   Lala Lund M.D on 11-13-18 at 12:53 PM  To page go to [email or web address] - password [email or web address]

## 2018-11-03 NOTE — Progress Notes (Signed)
MD ordered 10mg  IV cardizem push once now.

## 2018-11-03 NOTE — Progress Notes (Signed)
Patient taken off tele monitor and BIPAP. Placed on [facility] for patient comfort. Awaiting morphine drip from pharmacy.

## 2018-11-03 NOTE — Progress Notes (Signed)
   11-17-2018 0606  Vitals  Temp 99.2 F (37.3 C)  Temp Source Axillary  BP (!) 82/54  MAP (mmHg) (!) 63  BP Location Right Leg  BP Method Automatic  Patient Position (if appropriate) Lying  Pulse Rate (!) 138  Pulse Rate Source Monitor  ECG Heart Rate (!) 139  Cardiac Rhythm Atrial fibrillation  Resp (!) 22  Oxygen Therapy  SpO2 93 %  O2 Device Bi-PAP  FiO2 (%) 100 %  Pain Assessment  Pain Scale PAINAD  PAINAD (Pain Assessment in Advanced Dementia)  Breathing 1  Negative Vocalization 0  Facial Expression 0  Body Language 0  Consolability 0  PAINAD Score 1   Patient maintaining sats > 90% on BIPAP 100% FIO2.  Bilateral Coarse crackles noted on auscultation. Afib with Rate in High 130s to low 150s. Pt with soft Bps as Documented above.

## 2018-11-03 NOTE — Progress Notes (Signed)
Informed Dr. Candiss Norse that patients HR is in the 160's and BP 80's and on 100% 02 bipap. MD to assess patient.

## 2018-11-03 NOTE — Telephone Encounter (Signed)
Daughter called to inform [facility] staff of mother's death this morning. Daughter asked that all of mother's appt with cancer center be cancelled. Daughter thanked Dr. Irene Limbo, Emeline Darling and this writer for providing care while mother patient at [facility].

## 2018-11-03 NOTE — Significant Event (Signed)
Rapid Response Event Note  Overview:Called d/t HR-140s(afib), SpO2-88% on bipap .70 Time Called: 0215 Arrival Time: 0220 Event Type: Cardiac  Initial Focused Assessment: Pt laying in bed, confused, using accessory muscles to breath. T-98, HR-167(Afib), RR-27, SpO2-89% on .70 bipap.   Lungs with crackles t/o all lung fields. RT at bedside. Bipap increased to 100% with SpO2 increasing to 93%. 5 mg lopressor given PTA RRT and BP now 83/51. NS bolus ordered but d/c'd d/t crackles in lungs.  Dtr at bedside, updated on situation.  Aware that treatment for HR is dropping pt's BP and the treatment for low BP would further compromise pt's respiratory status as she is maxed out on bipap settings. Dtr is agreeable to 2mg  morphine for comfort at this time. Will reassess pt status t/o morning. Interventions: Bipap FiO2 increased to 100% 5mg  lopressor given for HR-160s 2mg  morphine ordered for comfort Plan of Care (if not transferred): Give morphine, continue to monitor pt. ? Comfort care if continues to decline.  Call RRT if further assistance needed. Event Summary: Name of Physician Notified: Schorr, NP at (PTA RRT)    at    Outcome: Stayed in room and stabalized    [place], Donika [employer]

## 2018-11-03 NOTE — Progress Notes (Signed)
PT Cancellation Note  Patient Details Name: Theresa Kramer MRN: [id number] DOB: [birth year]   Cancelled Treatment:    Reason Eval/Treat Not Completed: Medical issues which prohibited therapy. Will check back another day.   Weston Anna, PT Acute Rehabilitation Services Pager: [phone] Office: [phone]

## 2018-11-03 DEATH — deceased

## 2018-11-04 NOTE — Telephone Encounter (Signed)
Entry error

## 2018-11-10 ENCOUNTER — Ambulatory Visit: Payer: Medicare Other

## 2018-11-24 ENCOUNTER — Ambulatory Visit: Payer: Medicare Other
